# Patient Record
Sex: Female | Born: 1970
Health system: Southern US, Community
[De-identification: ages and names within clinical notes are randomized; demographics above are authoritative.]

## PROBLEM LIST (undated history)

## (undated) DIAGNOSIS — K644 Residual hemorrhoidal skin tags: Secondary | ICD-10-CM

## (undated) DIAGNOSIS — Z973 Presence of spectacles and contact lenses: Secondary | ICD-10-CM

## (undated) DIAGNOSIS — Z9289 Personal history of other medical treatment: Secondary | ICD-10-CM

## (undated) DIAGNOSIS — L83 Acanthosis nigricans: Secondary | ICD-10-CM

## (undated) DIAGNOSIS — D649 Anemia, unspecified: Secondary | ICD-10-CM

## (undated) DIAGNOSIS — I10 Essential (primary) hypertension: Secondary | ICD-10-CM

## (undated) DIAGNOSIS — E669 Obesity, unspecified: Secondary | ICD-10-CM

## (undated) DIAGNOSIS — M722 Plantar fascial fibromatosis: Secondary | ICD-10-CM

## (undated) DIAGNOSIS — G4730 Sleep apnea, unspecified: Secondary | ICD-10-CM

## (undated) DIAGNOSIS — M1710 Unilateral primary osteoarthritis, unspecified knee: Secondary | ICD-10-CM

## (undated) HISTORY — DX: Obesity, unspecified: E66.9

## (undated) HISTORY — DX: Plantar fascial fibromatosis: M72.2

## (undated) HISTORY — DX: Unilateral primary osteoarthritis, unspecified knee: M17.10

## (undated) HISTORY — DX: Anemia, unspecified: D64.9

## (undated) HISTORY — DX: Presence of spectacles and contact lenses: Z97.3

## (undated) HISTORY — DX: Acanthosis nigricans: L83

## (undated) HISTORY — DX: Residual hemorrhoidal skin tags: K64.4

---

## 1997-09-23 ENCOUNTER — Other Ambulatory Visit: Admission: RE | Admit: 1997-09-23 | Discharge: 1997-09-23 | Payer: Self-pay | Admitting: Obstetrics

## 1999-02-23 ENCOUNTER — Other Ambulatory Visit: Admission: RE | Admit: 1999-02-23 | Discharge: 1999-02-23 | Payer: Self-pay | Admitting: *Deleted

## 2001-11-12 ENCOUNTER — Ambulatory Visit (HOSPITAL_COMMUNITY): Admission: RE | Admit: 2001-11-12 | Discharge: 2001-11-12 | Payer: Self-pay | Admitting: Obstetrics

## 2001-11-12 ENCOUNTER — Encounter (INDEPENDENT_AMBULATORY_CARE_PROVIDER_SITE_OTHER): Payer: Self-pay | Admitting: Specialist

## 2002-12-05 ENCOUNTER — Encounter: Admission: RE | Admit: 2002-12-05 | Discharge: 2002-12-05 | Payer: Self-pay | Admitting: Obstetrics

## 2003-12-31 ENCOUNTER — Encounter (INDEPENDENT_AMBULATORY_CARE_PROVIDER_SITE_OTHER): Payer: Self-pay | Admitting: *Deleted

## 2003-12-31 ENCOUNTER — Ambulatory Visit (HOSPITAL_COMMUNITY): Admission: RE | Admit: 2003-12-31 | Discharge: 2003-12-31 | Payer: Self-pay | Admitting: Obstetrics

## 2006-12-06 ENCOUNTER — Inpatient Hospital Stay (HOSPITAL_COMMUNITY): Admission: AD | Admit: 2006-12-06 | Discharge: 2006-12-06 | Payer: Self-pay | Admitting: Obstetrics

## 2007-01-10 DIAGNOSIS — Z9289 Personal history of other medical treatment: Secondary | ICD-10-CM

## 2007-01-10 HISTORY — PX: ABDOMINAL HYSTERECTOMY: SHX81

## 2007-01-10 HISTORY — PX: COLONOSCOPY: SHX5424

## 2007-01-10 HISTORY — DX: Personal history of other medical treatment: Z92.89

## 2007-04-09 ENCOUNTER — Ambulatory Visit (HOSPITAL_COMMUNITY): Admission: RE | Admit: 2007-04-09 | Discharge: 2007-04-09 | Payer: Self-pay | Admitting: Obstetrics

## 2007-04-15 ENCOUNTER — Ambulatory Visit (HOSPITAL_COMMUNITY): Admission: RE | Admit: 2007-04-15 | Discharge: 2007-04-15 | Payer: Self-pay | Admitting: Cardiology

## 2007-04-24 ENCOUNTER — Inpatient Hospital Stay (HOSPITAL_COMMUNITY): Admission: RE | Admit: 2007-04-24 | Discharge: 2007-04-27 | Payer: Self-pay | Admitting: Obstetrics

## 2007-04-24 ENCOUNTER — Encounter (INDEPENDENT_AMBULATORY_CARE_PROVIDER_SITE_OTHER): Payer: Self-pay | Admitting: Obstetrics

## 2007-05-02 ENCOUNTER — Inpatient Hospital Stay (HOSPITAL_COMMUNITY): Admission: EM | Admit: 2007-05-02 | Discharge: 2007-05-07 | Payer: Self-pay | Admitting: Emergency Medicine

## 2007-06-11 LAB — HM COLONOSCOPY

## 2007-07-07 ENCOUNTER — Ambulatory Visit (HOSPITAL_BASED_OUTPATIENT_CLINIC_OR_DEPARTMENT_OTHER): Admission: RE | Admit: 2007-07-07 | Discharge: 2007-07-07 | Payer: Self-pay | Admitting: Cardiology

## 2007-07-13 ENCOUNTER — Ambulatory Visit: Payer: Self-pay | Admitting: Internal Medicine

## 2008-01-10 DIAGNOSIS — G4730 Sleep apnea, unspecified: Secondary | ICD-10-CM

## 2008-01-10 HISTORY — DX: Sleep apnea, unspecified: G47.30

## 2008-11-18 ENCOUNTER — Encounter: Admission: RE | Admit: 2008-11-18 | Discharge: 2008-11-18 | Payer: Self-pay | Admitting: Obstetrics

## 2009-04-06 ENCOUNTER — Ambulatory Visit (HOSPITAL_COMMUNITY): Admission: RE | Admit: 2009-04-06 | Discharge: 2009-04-06 | Payer: Self-pay | Admitting: Obstetrics

## 2010-04-03 LAB — COMPREHENSIVE METABOLIC PANEL
ALT: 45 U/L — ABNORMAL HIGH (ref 0–35)
AST: 27 U/L (ref 0–37)
Albumin: 4 g/dL (ref 3.5–5.2)
Alkaline Phosphatase: 44 U/L (ref 39–117)
BUN: 8 mg/dL (ref 6–23)
CO2: 25 mEq/L (ref 19–32)
Calcium: 9.2 mg/dL (ref 8.4–10.5)
Chloride: 103 mEq/L (ref 96–112)
Creatinine, Ser: 0.72 mg/dL (ref 0.4–1.2)
GFR calc Af Amer: >60 mL/min (ref >60)
GFR calc non Af Amer: >60 mL/min (ref >60)
Glucose, Bld: 120 mg/dL — ABNORMAL HIGH (ref 70–99)
Potassium: 3.8 mEq/L (ref 3.5–5.1)
Sodium: 137 mEq/L (ref 135–145)
Total Bilirubin: 0.4 mg/dL (ref 0.3–1.2)
Total Protein: 7.5 g/dL (ref 6.0–8.3)

## 2010-04-03 LAB — CBC
HCT: 34.5 % — ABNORMAL LOW (ref 36.0–46.0)
Hemoglobin: 11.5 g/dL — ABNORMAL LOW (ref 12.0–15.0)
MCHC: 33.2 g/dL (ref 30.0–36.0)
MCV: 73.7 fL — ABNORMAL LOW (ref 78.0–100.0)
Platelets: 282 10*3/uL (ref 150–400)
RBC: 4.69 MIL/uL (ref 3.87–5.11)
RDW: 14.3 % (ref 11.5–15.5)
WBC: 5.2 10*3/uL (ref 4.0–10.5)

## 2010-04-03 LAB — LIPID PANEL
Cholesterol: 162 mg/dL (ref 0–200)
HDL: 48 mg/dL (ref >39)
LDL Cholesterol: UNDETERMINED mg/dL (ref 0–99)
Total CHOL/HDL Ratio: 3.4 RATIO
Triglycerides: 430 mg/dL — ABNORMAL HIGH (ref <150)
VLDL: UNDETERMINED mg/dL (ref 0–40)

## 2010-04-03 LAB — T4: T4, Total: 8.6 ug/dL (ref 5.0–12.5)

## 2010-04-03 LAB — TSH: TSH: 2.081 u[IU]/mL (ref 0.350–4.500)

## 2010-05-24 NOTE — Procedures (Signed)
NAME:  Kayla Larsen, RADLOFF NO.:  [id number]   MEDICAL RECORD NO.:  [id number]          PATIENT TYPE:  OUT   LOCATION:  SLEEP CENTER                 FACILITY:  [facility]   PHYSICIAN:  Clinton D. Maple Hudson, MD, FCCP, FACPDATE OF BIRTH:  12-18-1968   DATE OF STUDY:  07/07/2007                            NOCTURNAL POLYSOMNOGRAM   REFERRING PHYSICIAN:  Osvaldo Shipper. Spruill, M.D.   INDICATION FOR STUDY:  Hypersomnia with sleep apnea.   EPWORTH SLEEPINESS SCORE:  9/24.   BMI:  37.8.   WEIGHT:  220 pounds.   HEIGHT:  64 inches.   NECK:  16 inches.   MEDICATIONS:  Home medications charted and reviewed.   SLEEP ARCHITECTURE:  Total sleep time 359 minutes with sleep efficiency  82.1%.  Stage 1 was 8.6%, stage 2 was 78.8%, stage 3 was 6.4%, REM 6.1%  of total sleep time.  Sleep latency 69 minutes, REM latency 343 minutes.  Awake after sleep onset 9.5 minutes.  Arousal index 26.7.  Unisom was  taken at bedtime.   RESPIRATORY DATA:  Apnea/hypopnea index (AHI) 2.7 per hour.  A total of  16 events were scored, including 14 obstructive apneas and 2 hypopneas.  Events were more common while supine.  REM AHI 27.3 per hour.  Respiratory disturbance index (RDI) was higher at 17.9 with RERA count  of 91, index 15.2 (respiratory events related to arousal).   OXYGEN DATA:  Moderately loud snoring with oxygen desaturation to a  nadir of 75%.  Mean oxygen saturation through the study was 95.4% on room air.  A total  of 8.1 minutes was spent with oxygen saturation less than 88%.   CARDIAC DATA:  Normal sinus rhythm.   MOVEMENT/PARASOMNIA:  Periodic limb movement with 5 events affecting  sleep, index 0.8 per hour, which is insignificant.   IMPRESSION/RECOMMENDATION:  1. Occasional respiratory events based on AHI of 2.7 per hour.      Respiratory disturbance index (RDI) revealed a higher number of      arousals associated with respiratory events not quite meeting      definition for apnea  or hypopnea, 17.9 per hour.  Physiologically,      this would be treated as sleep apnea.  Moderately loud snoring with      oxygen desaturation to a nadir of 75%.  2. Consider return for CPAP titration based on RDI of 17.9 per hour.      Otherwise, evaluate for alternative management as appropriate.      Clinton D. Maple Hudson, MD, [facility], FACP  Diplomate, Biomedical engineer of Sleep Medicine  Electronically Signed     CDY/MEDQ  D:  07/13/2007 11:33:25  T:  07/13/2007 11:58:57  Job:  [id number]

## 2010-05-24 NOTE — Discharge Summary (Signed)
NAMEAUSTYNN, Kayla Larsen NO.:  [id number]   MEDICAL RECORD NO.:  [id number]          PATIENT TYPE:  OUT   LOCATION:  SLEE                         FACILITY:  [facility]   PHYSICIAN:  Osvaldo Shipper. Spruill, M.D.DATE OF BIRTH:  1970-08-27   DATE OF ADMISSION:  05/02/2007  DATE OF DISCHARGE:  05/07/2007                               DISCHARGE SUMMARY   DISCHARGE DIAGNOSES:  1. Pneumonia.  2. Pulmonary collapse.  3. Anemia.  4. Excessive menstruation.  5. Obesity.  6. Hypertension.   Kayla Larsen is a 40 year old patient who was admitted to the [facility] on May 02, 2007, at that time with a chief complaint of  abdominal pain.   The initial history and physical was done by Dr. Webb Laws as well  as the documentation of the daily visits.  It is of note, this patient  had a hysterectomy approximately 1 week ago due to recurrent anemia and  severe bleeding.  She was seen in the office on the day prior to the  hospitalization with peripheral edema and weakness.  On the night prior  to admission, the patient had severe right-sided pleuritic chest pain  and came to the emergency department.  She was found to have pneumonia  with severe anemia and subsequently was admitted for the same.   The O2 saturation on admission was 97%.  The temperature was 99.2, the  pulse rate was 97.  The patient was noted to be in moderate distress.  On the chest exam, the breath sounds were decreased on the right and  there was noted 1+ pitting edema of the lower extremities   The patient was started on IV Rocephin and IV Zithromax in the emergency  department.  The patient was tried and crossmatched for packed cells and  transfused with 1 unit of packed cells.  It is of note that the initial  RBC was low at 2.94 and the initial hemoglobin was 6.6 with an MCV of  72.1   After transfusion the hemoglobin came up to 9.  With good pulmonary  toilet and IV antibiotics the patient's  temperature gradually improved,  the breathing gradually improved and the pain in the chest also  gradually improved.  The patient did require additional units of packed  cells with improvement of the hemoglobin and hematocrit.  The patient  underwent a CT scan of the abdomen and pelvis as there was a concern for  retroperitoneal bleeding following the hysterectomy.  The CT of the  abdomen showed right basilar atelectasis and minimal left basilar  atelectasis and no acute abdominal abnormality.  The CT of the pelvis  revealed postoperative hematoma formation in the inferior rectus muscles  and there was a small-to-moderate amount of uncomplicated fluid in the  pelvis cul-de-sac. There was also noted some sigmoid diverticulosis as  well as the status post hysterectomy   The patient continued to show improvement and on April 28, it was the  opinion the patient to be discharged home having received maximum  benefit from this hospitalization.   DISCHARGE MEDICATIONS:  1. Exforge  5/320 one daily.  2. Ferrous sulfate 325 mg three times a day.  3. Zithromax 250 mg daily for 3 days.  4. Oxycodone with acetaminophen one every 6 hours as needed for pain.  5. The patient is to stop Ibuprofen.   The patient is to notify the physician immediately if positive stools  for blood or other complications.  The patient is to see the OB/GYN  physician for re-evaluation of the surgical wound and follow up  according to GYN plan.      Ivery Quale, P.A.      Osvaldo Shipper. Spruill, M.D.  Electronically Signed    HB/MEDQ  D:  06/25/2007  T:  06/26/2007  Job:  [id number]

## 2010-05-24 NOTE — Op Note (Signed)
NAMEPHILOMENE, [name] NO.:  [id number]   MEDICAL RECORD NO.:  [id number]          PATIENT TYPE:  AMB   LOCATION:  SDC                           FACILITY:  [facility]   PHYSICIAN:  Kathreen Cosier, M.D.DATE OF BIRTH:  04-Jan-1971   DATE OF PROCEDURE:  DATE OF DISCHARGE:                               OPERATIVE REPORT   PREOP DIAGNOSIS:  Myoma uteri, multiple.   POSTOPERATIVE DIAGNOSIS:  Myoma uteri, multiple.   SURGEON:  Dr. Gaynell Face.   FIRST ASSISTANT:  Dr. Clearance Coots.   PROCEDURE:  The patient placed on the operating room table in the supine  position.  General anesthesia administered and abdomen prepped and  draped.  Bladder emptied with a Foley catheter.  Transverse suprapubic  incision made through the old scar, carried down to the fascia.  Fascia  cleaned and incised the length of the incision.  Recti muscles were  retracted laterally.  Peritoneum incised longitudinally.  The uterus was  20 weeks size multiple myomas.  The uterus was adherent to the bowel and  this was dissected free.  The right round ligament was grasped with a  Kelly clamp, cut, suture ligated with #1 chromic.  Procedure done in a  similar fashion on the other side.   Using Metzenbaum scissors the bladder was dissected off of the cervix.  The right utero-ovarian ligament was cut, suture ligated with #1  chromic.  Procedure done in a similar fashion on the other side.  Uterine vessels were skeletonized bilaterally and double clamped with  Heaney clamps on the right, cut and suture ligated x2 with #1 chromic.  Procedure done in a similar fashion on the other side.  The right  infundibulopelvic ligaments grasped with a straight Kocher, cut and  suture-ligated with a #1 chromic. Done in a similar fashion on the other  side.  The uterus was separated from the cervix with a scalpel and then  the cervix was dissected off.  Modified Richardson sutures were placed  with #1 chromic in the vault of the  vagina.  The endovaginal vault was  run with interlocking suture of #1 chromic.   Hemostasis was satisfactory.  Lap and sponge counts correct.  The  portion of the left tube was grasped with a Kelly clamp, cut and suture  ligated with #1 chromic.  Procedure done in a similar fashion on the  other side.  The portion of tubes were sent to pathology along with the  uterus and cervix.  Lap and sponge counts correct.  Abdomen closed in  layers; the peritoneum with continuous suture of O chromic, fascia with  continuous suture of 0 Dexon, the skin closed with a subcuticular stitch  of 4-0 Monocryl.  Blood loss 1000 mL.  The patient tolerated the  procedure well, taken to the recovery room in good condition.           ______________________________  Kathreen Cosier, M.D.     BAM/MEDQ  D:  04/24/2007  T:  04/24/2007  Job:  [id number]

## 2010-05-27 NOTE — Op Note (Signed)
   NAME:  Kayla Larsen, Kayla Larsen                         ACCOUNT NO.:  [id number]   MEDICAL RECORD NO.:  [id number]                   PATIENT TYPE:  AMB   LOCATION:  SDC                                  FACILITY:  [facility]   PHYSICIAN:  Kathreen Cosier, M.D.           DATE OF BIRTH:  1970-04-25   DATE OF PROCEDURE:  11/12/2001  DATE OF DISCHARGE:                                 OPERATIVE REPORT   PREOPERATIVE DIAGNOSIS:  Dysfunctional uterine bleeding.   PROCEDURE:  Using MAC, the patient in lithotomy position.  The perineum and  vagina prepped and draped.  Bladder emptied with a straight catheter.  Bilateral examination revealed the uterus was enlarged with multiple myomas.  A weighted speculum was placed in the vagina.  The cervix was injected with  1% Xylocaine, a total of 8 cc of _________.  The anterior lip of the cervix  grasped with a tenaculum and the cervix curetted of a small amount of  tissue.  Endometrial cavity sounded 10 cm.  Cervix dilated to a 27 Pratt.  The endometrial cavity was posterior.  The endometrial cavity was curetted  with sharp curette until clean.  A large amount of tissue obtained.  Polyp  forceps inserted, no polyps obtained.  The patient tolerated the procedure  well, taken to the recovery room in good condition.                                               Kathreen Cosier, M.D.    BAM/MEDQ  D:  11/12/2001  T:  11/12/2001  Job:  [id number]

## 2010-05-27 NOTE — Discharge Summary (Signed)
Kayla Larsen, Kayla Larsen NO.:  [id number]   MEDICAL RECORD NO.:  [id number]           PATIENT TYPE:   LOCATION:                                 FACILITY:   PHYSICIAN:  Kathreen Cosier, M.D.DATE OF BIRTH:  01-Jun-1970   DATE OF ADMISSION:  04/24/2007  DATE OF DISCHARGE:  04/27/2007                               DISCHARGE SUMMARY   The patient is a 40 year old gravida 2, para 2-0-0-2 who has heavy  periods with clots.  She had NovaSure ablation in the past, C-section  x2, D&C, and 18 to 20-week size myomas.  She underwent a total abdominal  hysterectomy and bilateral salpingectomy on April 24, 2007.  Postoperatively, she did well.  On admission, her hemoglobin was 9.7 and  postop 8.3, white count 5 and 15.1, and platelets 366 and 319.  Sodium  138, potassium 3.8, and chloride 106.  Urine negative and creatinine  0.62.  Postoperatively, the patient did well and was discharged on the  third postoperative day, ambulatory on a regular diet.  She is to see me  in 4 weeks.   DISCHARGE DIAGNOSIS:  Status post total abdominal hysterectomy and  bilateral salpingectomy for myoma uteri.           ______________________________  Kathreen Cosier, M.D.     BAM/MEDQ  D:  05/15/2007  T:  05/15/2007  Job:  [id number]

## 2010-05-27 NOTE — Op Note (Signed)
NAMEVINETTE, CRITES NO.:  [id number]   MEDICAL RECORD NO.:  [id number]          PATIENT TYPE:  AMB   LOCATION:  SDC                           FACILITY:  [facility]   PHYSICIAN:  Kathreen Cosier, M.D.DATE OF BIRTH:  06-20-1970   DATE OF PROCEDURE:  12/31/2003  DATE OF DISCHARGE:                                 OPERATIVE REPORT   PREOPERATIVE DIAGNOSIS:  Dysfunctional uterine bleeding.   Under general anesthesia, patient in the __________ position, laminaria was  removed from the cervix and the perineum and vagina were prepped and draped,  bladder emptied with a straight catheter.  Bimanual exam revealed the uterus  to be enlarged with myomas.  Speculum placed in the vagina, anterior lip of  the cervix grasped with a tenaculum.  The endocervix curetted, a small  amount of tissue obtained.  The endometrial cavity sounded to 11 cm.  Using  the Hegar dilator, the cervical length was 5.5 cm, cavity length 6 cm.  The  hysteroscope was inserted and the cavity was noted to be normal.  Sharp  curettage was then performed and a small amount of tissue was obtained.  The  NovaSure device was inserted.  The cavity width was 4.7 cm, and NovaSure  ablation was done at 155 watts at 58 seconds.  After the procedure, the  hysteroscopy was repeated and there was total ablation in the cavity.  The  fluid deficit was 50 mL.  The patient tolerated the procedure well, taken to  the recovery room in good condition.      BAM/MEDQ  D:  12/31/2003  T:  12/31/2003  Job:  [id number]

## 2010-10-04 LAB — CROSSMATCH
ABO/RH(D): B POS
Antibody Screen: NEGATIVE

## 2010-10-04 LAB — BASIC METABOLIC PANEL
BUN: 6
BUN: 8
CO2: 26
CO2: 24
CO2: 26
Calcium: 9.1
Calcium: 8.5
Chloride: 106
Chloride: 103
Chloride: 105
Creatinine, Ser: 0.62
Creatinine, Ser: 0.77
GFR calc Af Amer: >60
GFR calc Af Amer: >60
GFR calc non Af Amer: >60
GFR calc non Af Amer: >60
GFR calc non Af Amer: >60
Glucose, Bld: 97
Glucose, Bld: 109 — ABNORMAL HIGH
Glucose, Bld: 111 — ABNORMAL HIGH
Potassium: 3.8
Potassium: 3.2 — ABNORMAL LOW
Potassium: 4.3
Sodium: 138
Sodium: 136
Sodium: 140

## 2010-10-04 LAB — CBC
HCT: 29.7 — ABNORMAL LOW
HCT: 25.1 — ABNORMAL LOW
HCT: 21.2 — ABNORMAL LOW
HCT: 22.7 — ABNORMAL LOW
HCT: 26.9 — ABNORMAL LOW
HCT: 30.4 — ABNORMAL LOW
Hemoglobin: 9.7 — ABNORMAL LOW
Hemoglobin: 8.3 — ABNORMAL LOW
Hemoglobin: 6.6 — CL
Hemoglobin: 8.6 — ABNORMAL LOW
Hemoglobin: 9.1 — ABNORMAL LOW
Hemoglobin: 9.9 — ABNORMAL LOW
MCHC: 32.7
MCHC: 33.1
MCHC: 31.3
MCHC: 32.1
MCHC: 32.2
MCV: 72.7 — ABNORMAL LOW
MCV: 72.5 — ABNORMAL LOW
MCV: 72.1 — ABNORMAL LOW
MCV: 73.8 — ABNORMAL LOW
MCV: 75.5 — ABNORMAL LOW
Platelets: 366
Platelets: 319
Platelets: 490 — ABNORMAL HIGH
Platelets: 543 — ABNORMAL HIGH
RBC: 4.09
RBC: 3.46 — ABNORMAL LOW
RBC: 2.94 — ABNORMAL LOW
RBC: 3.07 — ABNORMAL LOW
RBC: 3.65 — ABNORMAL LOW
RDW: 15.4
RDW: 15
RDW: 14.5
RDW: 19 — ABNORMAL HIGH
RDW: 19.5 — ABNORMAL HIGH
WBC: 5
WBC: 15.1 — ABNORMAL HIGH
WBC: 12.3 — ABNORMAL HIGH
WBC: 7.4
WBC: 9.7

## 2010-10-04 LAB — URINALYSIS, ROUTINE W REFLEX MICROSCOPIC
Bilirubin Urine: NEGATIVE
Glucose, UA: NEGATIVE
Hgb urine dipstick: NEGATIVE
Protein, ur: NEGATIVE

## 2010-10-04 LAB — PROTEIN, URINE, 24 HOUR
Collection Interval-UPROT: 24
Urine Total Volume-UPROT: 2550

## 2010-10-04 LAB — DIFFERENTIAL
Basophils Absolute: 0
Basophils Relative: 0
Eosinophils Absolute: 0.1
Eosinophils Relative: 2
Lymphocytes Relative: 20
Lymphs Abs: 1.5
Monocytes Absolute: 0.7
Monocytes Relative: 9
Neutro Abs: 5.1
Neutrophils Relative %: 69

## 2010-10-04 LAB — HEPATIC FUNCTION PANEL
ALT: 28
AST: 21
Albumin: 3.2 — ABNORMAL LOW
Alkaline Phosphatase: 50
Bilirubin, Direct: 0.1
Indirect Bilirubin: 0.3
Total Bilirubin: 0.4
Total Protein: 6.6

## 2010-10-04 LAB — COMPREHENSIVE METABOLIC PANEL
BUN: 5 — ABNORMAL LOW
CO2: 26
Calcium: 8.5
Chloride: 107
Creatinine, Ser: 0.68
GFR calc non Af Amer: >60
Total Bilirubin: 0.2 — ABNORMAL LOW

## 2010-10-04 LAB — OCCULT BLOOD X 1 CARD TO LAB, STOOL: Fecal Occult Bld: POSITIVE

## 2010-10-18 LAB — CBC
HCT: 25.3 — ABNORMAL LOW
Hemoglobin: 7.9 — CL
MCHC: 31.3
RDW: 20.6 — ABNORMAL HIGH

## 2012-01-10 HISTORY — PX: OOPHORECTOMY: SHX86

## 2012-01-10 HISTORY — PX: OTHER SURGICAL HISTORY: SHX169

## 2012-01-26 ENCOUNTER — Ambulatory Visit: Payer: Self-pay | Admitting: Dietician

## 2012-04-18 ENCOUNTER — Other Ambulatory Visit (HOSPITAL_COMMUNITY): Payer: Self-pay | Admitting: Obstetrics

## 2012-04-29 ENCOUNTER — Other Ambulatory Visit (HOSPITAL_COMMUNITY): Payer: Self-pay | Admitting: Obstetrics

## 2012-04-29 DIAGNOSIS — Z1231 Encounter for screening mammogram for malignant neoplasm of breast: Secondary | ICD-10-CM

## 2012-05-08 ENCOUNTER — Other Ambulatory Visit (HOSPITAL_COMMUNITY): Payer: Self-pay | Admitting: Obstetrics

## 2012-05-08 ENCOUNTER — Ambulatory Visit (HOSPITAL_COMMUNITY)
Admission: RE | Admit: 2012-05-08 | Discharge: 2012-05-08 | Disposition: A | Discharge status: Home / Self Care | Payer: No Typology Code available for payment source | Source: Ambulatory Visit | Attending: Obstetrics | Admitting: Obstetrics

## 2012-05-08 DIAGNOSIS — Z1231 Encounter for screening mammogram for malignant neoplasm of breast: Secondary | ICD-10-CM

## 2012-05-08 DIAGNOSIS — N83202 Unspecified ovarian cyst, left side: Secondary | ICD-10-CM

## 2012-05-10 ENCOUNTER — Ambulatory Visit (HOSPITAL_COMMUNITY): Payer: No Typology Code available for payment source

## 2012-05-17 ENCOUNTER — Ambulatory Visit (HOSPITAL_COMMUNITY): Payer: No Typology Code available for payment source

## 2012-05-22 ENCOUNTER — Ambulatory Visit (HOSPITAL_COMMUNITY): Payer: No Typology Code available for payment source

## 2012-05-27 ENCOUNTER — Ambulatory Visit: Payer: Self-pay | Admitting: Physician Assistant

## 2012-05-28 ENCOUNTER — Ambulatory Visit (HOSPITAL_COMMUNITY)
Admission: RE | Admit: 2012-05-28 | Discharge: 2012-05-28 | Disposition: A | Discharge status: Home / Self Care | Payer: No Typology Code available for payment source | Source: Ambulatory Visit | Attending: Obstetrics | Admitting: Obstetrics

## 2012-05-28 DIAGNOSIS — N83202 Unspecified ovarian cyst, left side: Secondary | ICD-10-CM

## 2012-05-28 DIAGNOSIS — N83209 Unspecified ovarian cyst, unspecified side: Secondary | ICD-10-CM | POA: Insufficient documentation

## 2012-05-28 DIAGNOSIS — Z90710 Acquired absence of both cervix and uterus: Secondary | ICD-10-CM | POA: Insufficient documentation

## 2012-05-28 DIAGNOSIS — E669 Obesity, unspecified: Secondary | ICD-10-CM | POA: Insufficient documentation

## 2012-06-07 ENCOUNTER — Other Ambulatory Visit (HOSPITAL_COMMUNITY): Payer: Self-pay | Admitting: Obstetrics

## 2012-06-07 DIAGNOSIS — IMO0002 Reserved for concepts with insufficient information to code with codable children: Secondary | ICD-10-CM

## 2012-06-10 ENCOUNTER — Ambulatory Visit (HOSPITAL_COMMUNITY)
Admission: RE | Admit: 2012-06-10 | Discharge: 2012-06-10 | Disposition: A | Discharge status: Home / Self Care | Payer: No Typology Code available for payment source | Source: Ambulatory Visit | Attending: Obstetrics | Admitting: Obstetrics

## 2012-06-10 DIAGNOSIS — IMO0002 Reserved for concepts with insufficient information to code with codable children: Secondary | ICD-10-CM

## 2012-06-10 DIAGNOSIS — R1900 Intra-abdominal and pelvic swelling, mass and lump, unspecified site: Secondary | ICD-10-CM | POA: Insufficient documentation

## 2012-06-10 DIAGNOSIS — Z90710 Acquired absence of both cervix and uterus: Secondary | ICD-10-CM | POA: Insufficient documentation

## 2012-06-10 LAB — CREATININE, SERUM: GFR calc non Af Amer: >90 mL/min (ref >90)

## 2012-06-10 MED ORDER — GADOBENATE DIMEGLUMINE 529 MG/ML IV SOLN
20.0000 mL | Freq: Once | INTRAVENOUS | Status: AC | PRN
  Administered 2012-06-10: 20 mL via INTRAVENOUS

## 2012-07-01 ENCOUNTER — Telehealth: Payer: Self-pay | Admitting: Gynecologic Oncology

## 2012-07-01 NOTE — Telephone Encounter (Signed)
Consult Note: Gyn-Onc  Consult was requested by Dr. Chevis Pretty for the evaluation of Kayla Larsen 42 y.o. female  CC:  Pelvic mass  Assessment/Plan:  [name]  is a 42 y.o.  year old **   HPI: Kayla Larsen is a 42 y.o.   Pelvic UTZ 05/28/2012 notable for  Endometrium: The uterus is surgically absent. Right ovary: The ovary is not visualized. Left ovary: The ovary is not visualized. Other findings: Slightly to the left of midline, there is a multicystic mass. The largest component contains internal echoes and measures 10.0 x 5.5 x 6.8 cm. Other components appear more  simple but have thickened walls and measure up to 5.5 cm. No vascular components are identified with color Doppler evaluation .   Current Meds:  No outpatient encounter prescriptions on file as of 07/01/2012.   No facility-administered encounter medications on file as of 07/01/2012.    Allergy: Allergies not on file  Social Hx:   History   Social History  . Marital Status: Single    Spouse Name: N/A    Number of Children: N/A  . Years of Education: N/A   Occupational History  . Not on file.   Social History Main Topics  . Smoking status: Not on file  . Smokeless tobacco: Not on file  . Alcohol Use: Not on file  . Drug Use: Not on file  . Sexually Active: Not on file   Other Topics Concern  . Not on file   Social History Narrative  . No narrative on file    Past Surgical Hx: No past surgical history on file.  Past Medical Hx: No past medical history on file.  Past Gynecological History:  ** No LMP recorded. Patient has had a hysterectomy.  Family Hx: No family history on file.  Review of Systems:  Constitutional  Feels well,  ** Skin/Breast  No rash, sores, jaundice, itching, dryness Cardiovascular  No chest pain, shortness of breath, or edema  Pulmonary  No cough or wheeze.  Gastro Intestinal  No nausea, vomitting, or diarrhoea. No bright red blood per rectum, no abdominal  pain, change in bowel movement, or constipation.  Genito Urinary  No frequency, urgency, dysuria, ** Musculo Skeletal  No myalgia, arthralgia, joint swelling or pain  Neurologic  No weakness, numbness, change in gait,  Psychology  No depression, anxiety, insomnia.   Vitals:  @V @  Physical Exam: WD in NAD Neck  Supple NROM, without any enlargements.  Lymph Node Survey No cervical supraclavicular or inguinal adenopathy Cardiovascular  Pulse normal rate, regularity and rhythm. S1 and S2 normal.  Lungs  Clear to auscultation bilateraly, without wheezes/crackles/rhonchi. Good air movement.  Skin  No rash/lesions/breakdown  Psychiatry  Alert and oriented to person, place, and time  Abdomen  Normoactive bowel sounds, abdomen soft, non-tender and obese. Surgical  sites intact without evidence of hernia.  Back No CVA tenderness Genito Urinary  Vulva/vagina: Normal external female genitalia.  No lesions. No discharge or bleeding.  Bladder/urethra:  No lesions or masses  Vagina: **  Cervix: Normal appearing, no lesions.  Uterus: Small, mobile, no parametrial involvement or nodularity.  Adnexa: No palpable masses. Rectal  Good tone, no masses no cul de sac nodularity.  Extremities  No bilateral cyanosis, clubbing or edema.   Laurette Schimke, MD, PhD 07/01/2012, 10:16 PM

## 2012-07-02 ENCOUNTER — Encounter: Payer: Self-pay | Admitting: Gynecologic Oncology

## 2012-07-02 ENCOUNTER — Ambulatory Visit: Payer: No Typology Code available for payment source | Attending: Gynecologic Oncology | Admitting: Gynecologic Oncology

## 2012-07-02 VITALS — BP 152/94 | HR 64 | Temp 99.2°F | Resp 18 | Ht 64.0 in | Wt 241.9 lb

## 2012-07-02 DIAGNOSIS — N83299 Other ovarian cyst, unspecified side: Secondary | ICD-10-CM

## 2012-07-02 DIAGNOSIS — R1909 Other intra-abdominal and pelvic swelling, mass and lump: Secondary | ICD-10-CM | POA: Insufficient documentation

## 2012-07-02 DIAGNOSIS — Z90710 Acquired absence of both cervix and uterus: Secondary | ICD-10-CM | POA: Insufficient documentation

## 2012-07-02 NOTE — Patient Instructions (Addendum)
Left salpingo-oophorectomy and right salpingectomy was performed by Dr. De Blanch on Tuesday, 07/30/2012 His present for preoperative assessment is scheduled   Thank you very much [name] for allowing me to provide care for you today.  I appreciate your confidence in choosing our Gynecologic Oncology team.  If you have any questions about your visit today please call our office and we will get back to you as soon as possible.  [name]. Annaclaire Walsworth MD., PhD Gynecologic Oncology

## 2012-07-02 NOTE — Progress Notes (Signed)
Consult Note: Gyn-Onc  Consult was requested by Dr. Chevis Pretty for the evaluation of JAID QUIRION 42 y.o. female  CC:  Pelvic mass  Assessment/Plan:  Ms. AIRYANNA DIPALMA  is a 42 y.o.  year old with a 12 cm pelvic mass that appears to arise from the left adnexa. A CA 125 was ordered. The recommendation was for left salpingo-oophorectomy with possible right salpingectomy. Based on frozen section findings that of surgical procedure could be inclusive of omentectomy contralateral oophorectomy  staging and debulking.  The risks of procedure discussed with patient were that of infection bleeding damage to surrounding structures prolonged hospitalization and reoperation. All of her questions were answered to her satisfaction. She is aware of this procedure will be performed by Dr. De Blanch on 07/30/2012. All of her questions were answered to her satisfaction.   HPI: RABECKA BRENDEL is a 42 y.o.  Who on routine physical examination in may of 2040 was noted have a pelvic mass. A transvaginal ultrasound of the pelvis obtained on 05/28/2012 and was notable for a mass slightly to the left of the midline is multicystic the largest component contains internal echoes and measures 10.0 x 5.5 x 6.8 cm. MRI of the pelvis on 06/10/2012 demonstrates a presence of 12.0 x 10.4 x 7.3 cm complex multicystic mass is no evidence for mural nodule he rectal wall septal thickening a papillary excrescence. There does appear to be residual ovarian tissue along the anterior lateral margin of the mass. Right ovary measured 3.2 x 1.8 x 2.6 cm. No pelvic lymphadenopathy. Diverticular changes were noted in the sigmoid without features of diverticulitis a very trace amount of free fluid was noted in the cul-de-sac  Ms. Pylant denies any increased abdominal girth is no early satiety change in appetite change in bowel or rectal habits malaise or fatigue.  Current Meds:  Outpatient Encounter Prescriptions as of 07/02/2012   Medication Sig Dispense Refill  . amLODipine-valsartan (EXFORGE) 5-320 MG per tablet Take 1 tablet by mouth daily.      . meloxicam (MOBIC) 7.5 MG tablet Take 7.5 mg by mouth daily.      . Multiple Vitamin (MULTIVITAMIN WITH MINERALS) TABS Take 1 tablet by mouth daily.      Marland Kitchen PENICILLIN V POTASSIUM PO Take by mouth 4 (four) times daily.       No facility-administered encounter medications on file as of 07/02/2012.    Allergy: Not on File  Social Hx:   History   Social History  . Marital Status: Single    Spouse Name: N/A    Number of Children: N/A  . Years of Education: N/A   Occupational History  . Not on file.   Social History Main Topics  . Smoking status: Former Smoker -- 4 years    Quit date: 09/29/2011  . Smokeless tobacco: Not on file  . Alcohol Use: Yes     Comment: occassional  . Drug Use: No  . Sexually Active: No   Other Topics Concern  . Not on file   Social History Narrative  . No narrative on file    Past Surgical Hx: C-section x2 Hysterectomy 2009  Past Medical Hx: No past medical history on file.  Past Gynecological History:  Gravida 2 para 2 last menstrual period 2009 at the time of hysterectomy for uterine leiomyoma menarche occurred at the age of 83 with regular until hysterectomy. Reports oral contraceptive pill use for 5 years followed by bilateral tubal ligation  Family Hx:  No family history on file.  Review of Systems:  Constitutional  Feels well,  no weight loss  Cardiovascular  No chest pain, shortness of breath, or edema  Pulmonary  No cough or wheeze.  Gastro Intestinal  No nausea, vomitting, or diarrhoea. No bright red blood per rectum, no abdominal pain, change in bowel movement, or constipation. No change in appetite no early satiety Genito Urinary  No frequency, urgency, dysuria, Musculo Skeletal  No myalgia, arthralgia, joint swelling or pain  Neurologic  No weakness, numbness, change in gait,  Psychology  No  depression, anxiety, insomnia.   Vitals:  BP 152/94  Pulse 64  Temp(Src) 99.2 F (37.3 C) (Oral)  Resp 18  Ht 5\' 4"  (1.626 m)  Wt 241 lb 14.4 oz (109.725 kg)  BMI 41.5 kg/m2  Physical Exam: WD in NAD Neck  Supple NROM, without any enlargements.  Lymph Node Survey No cervical supraclavicular or inguinal adenopathy Cardiovascular  Pulse normal rate, regularity and rhythm. S1 and S2 normal.  Lungs  Clear to auscultation bilateraly, without wheezes/crackles/rhonchi. Good air movement.  Skin  No rash/lesions/breakdown  Psychiatry  Alert and oriented to person, place, and time  Abdomen  Normoactive bowel sounds, abdomen soft, non-tender and obese. Pfannenstiel surgical  site intact without evidence of hernia.  Back No CVA tenderness Genito Urinary  Vulva/vagina: Normal external female genitalia.  No lesions. No discharge or bleeding.  Bladder/urethra:  No lesions or masses  Vagina: Well estrogenized no lesion  Adnexa: 13 cm firm mass. Rectal  Good tone, no masses no cul de sac nodularity. Mass not palpable on rectal Extremities  No bilateral cyanosis, clubbing or edema.   Laurette Schimke, MD, PhD 07/02/2012, 5:00 PM

## 2012-07-22 ENCOUNTER — Encounter (HOSPITAL_COMMUNITY): Payer: Self-pay | Admitting: Pharmacy Technician

## 2012-07-25 ENCOUNTER — Other Ambulatory Visit (HOSPITAL_COMMUNITY): Payer: Self-pay | Admitting: Gynecology

## 2012-07-25 NOTE — Patient Instructions (Addendum)
18 Kayla Larsen  07/25/2012   Your procedure is scheduled on: 07-30-2012  Report to [facility] at 945 AM.  Call this number if you have problems the morning of surgery [phone]   Remember:   Do not eat food After Midnight Sunday night   bowel prep with clear liquids all day Monday 07-29-2012   Take these medicines the morning of surgery with A SIP OF WATER: none                                SEE [place] PREPARING FOR SURGERY SHEET   Do not wear jewelry, make-up or nail polish.  Do not wear lotions, powders, or perfumes. You may wear deodorant.   Men may shave face and neck.  Do not bring valuables to the hospital. [place] IS NOT RESPONSIBLE FOR VALUEABLES.  Contacts, dentures or bridgework may not be worn into surgery.  Leave suitcase in the car. After surgery it may be brought to your room.  For patients admitted to the hospital, checkout time is 11:00 AM the day of discharge.   Patients discharged the day of surgery will not be allowed to drive home.  Name and phone number of your driver:  Special Instructions: N/A   Please read over the following fact sheets that you were given: MRSA Informatio, clear liquid sheet, incentive sprometer fact sheet.  Call Cain Sieve RN pre op nurse if needed [phone]    FAILURE TO FOLLOW THESE INSTRUCTIONS MAY RESULT IN THE CANCELLATION OF YOUR SURGERY.  PATIENT SIGNATURE___________________________________________  NURSE SIGNATURE_____________________________________________

## 2012-07-26 ENCOUNTER — Encounter (HOSPITAL_COMMUNITY): Payer: Self-pay

## 2012-07-26 ENCOUNTER — Ambulatory Visit (HOSPITAL_COMMUNITY)
Admission: RE | Admit: 2012-07-26 | Discharge: 2012-07-26 | Disposition: A | Discharge status: Home / Self Care | Payer: No Typology Code available for payment source | Source: Ambulatory Visit | Attending: Gynecology | Admitting: Gynecology

## 2012-07-26 ENCOUNTER — Encounter (HOSPITAL_COMMUNITY)
Admission: RE | Admit: 2012-07-26 | Discharge: 2012-07-26 | Disposition: A | Discharge status: Home / Self Care | Payer: No Typology Code available for payment source | Source: Ambulatory Visit | Attending: Gynecology | Admitting: Gynecology

## 2012-07-26 DIAGNOSIS — Z01818 Encounter for other preprocedural examination: Secondary | ICD-10-CM | POA: Insufficient documentation

## 2012-07-26 DIAGNOSIS — R1900 Intra-abdominal and pelvic swelling, mass and lump, unspecified site: Secondary | ICD-10-CM | POA: Insufficient documentation

## 2012-07-26 DIAGNOSIS — Z0183 Encounter for blood typing: Secondary | ICD-10-CM | POA: Insufficient documentation

## 2012-07-26 DIAGNOSIS — Z01812 Encounter for preprocedural laboratory examination: Secondary | ICD-10-CM | POA: Insufficient documentation

## 2012-07-26 HISTORY — DX: Sleep apnea, unspecified: G47.30

## 2012-07-26 HISTORY — DX: Essential (primary) hypertension: I10

## 2012-07-26 HISTORY — DX: Personal history of other medical treatment: Z92.89

## 2012-07-26 LAB — COMPREHENSIVE METABOLIC PANEL
Alkaline Phosphatase: 49 U/L (ref 39–117)
BUN: 15 mg/dL (ref 6–23)
CO2: 28 mEq/L (ref 19–32)
GFR calc Af Amer: >90 mL/min (ref >90)
GFR calc non Af Amer: >90 mL/min (ref >90)
Glucose, Bld: 102 mg/dL — ABNORMAL HIGH (ref 70–99)
Potassium: 3.8 mEq/L (ref 3.5–5.1)
Total Protein: 7.7 g/dL (ref 6.0–8.3)

## 2012-07-26 LAB — CBC WITH DIFFERENTIAL/PLATELET
Eosinophils Absolute: 0.2 10*3/uL (ref 0.0–0.7)
Eosinophils Relative: 4 % (ref 0–5)
Hemoglobin: 11.2 g/dL — ABNORMAL LOW (ref 12.0–15.0)
Lymphs Abs: 3 10*3/uL (ref 0.7–4.0)
MCH: 22.6 pg — ABNORMAL LOW (ref 26.0–34.0)
MCV: 72.3 fL — ABNORMAL LOW (ref 78.0–100.0)
Monocytes Relative: 10 % (ref 3–12)
Neutrophils Relative %: 38 % — ABNORMAL LOW (ref 43–77)
RBC: 4.95 MIL/uL (ref 3.87–5.11)

## 2012-07-29 ENCOUNTER — Telehealth: Payer: Self-pay | Admitting: Gynecologic Oncology

## 2012-07-29 MED ORDER — AMLODIPINE BESYLATE 5 MG PO TABS
5.0000 mg | ORAL_TABLET | Freq: Once | ORAL | Status: DC
  Administered 2012-07-30: 5 mg via ORAL
  Filled 2012-07-29: qty 1

## 2012-07-29 NOTE — Telephone Encounter (Signed)
Spoke with patient about pre-operative status.  She has started taking in clear liquids only this am.  Plans to begin bowel prep in at 10 am today.  No concerns voiced.  Instructed to call for any needs.  Verbalizing understanding of instructions for surgery tomorrow morning.

## 2012-07-30 ENCOUNTER — Ambulatory Visit (HOSPITAL_COMMUNITY): Payer: No Typology Code available for payment source | Admitting: Anesthesiology

## 2012-07-30 ENCOUNTER — Inpatient Hospital Stay (HOSPITAL_COMMUNITY)
Admission: RE | Admit: 2012-07-30 | Discharge: 2012-08-02 | DRG: 743 | Disposition: A | Discharge status: Home / Self Care | Payer: No Typology Code available for payment source | Source: Ambulatory Visit | Attending: Obstetrics & Gynecology | Admitting: Obstetrics & Gynecology

## 2012-07-30 ENCOUNTER — Encounter (HOSPITAL_COMMUNITY)
Admission: RE | Disposition: A | Discharge status: Home / Self Care | Payer: Self-pay | Source: Ambulatory Visit | Attending: Obstetrics & Gynecology

## 2012-07-30 ENCOUNTER — Encounter (HOSPITAL_COMMUNITY): Payer: Self-pay | Admitting: *Deleted

## 2012-07-30 ENCOUNTER — Encounter (HOSPITAL_COMMUNITY): Payer: Self-pay | Admitting: Anesthesiology

## 2012-07-30 DIAGNOSIS — D279 Benign neoplasm of unspecified ovary: Principal | ICD-10-CM | POA: Diagnosis present

## 2012-07-30 DIAGNOSIS — N7013 Chronic salpingitis and oophoritis: Secondary | ICD-10-CM

## 2012-07-30 DIAGNOSIS — R1900 Intra-abdominal and pelvic swelling, mass and lump, unspecified site: Secondary | ICD-10-CM | POA: Diagnosis present

## 2012-07-30 DIAGNOSIS — N736 Female pelvic peritoneal adhesions (postinfective): Secondary | ICD-10-CM | POA: Diagnosis present

## 2012-07-30 DIAGNOSIS — N80109 Endometriosis of ovary, unspecified side, unspecified depth: Secondary | ICD-10-CM | POA: Diagnosis present

## 2012-07-30 DIAGNOSIS — N2889 Other specified disorders of kidney and ureter: Secondary | ICD-10-CM | POA: Diagnosis present

## 2012-07-30 DIAGNOSIS — I10 Essential (primary) hypertension: Secondary | ICD-10-CM | POA: Diagnosis present

## 2012-07-30 DIAGNOSIS — N83209 Unspecified ovarian cyst, unspecified side: Secondary | ICD-10-CM

## 2012-07-30 DIAGNOSIS — N801 Endometriosis of ovary: Secondary | ICD-10-CM | POA: Diagnosis present

## 2012-07-30 HISTORY — PX: LAPAROTOMY: SHX154

## 2012-07-30 LAB — TYPE AND SCREEN
ABO/RH(D): B POS
Weak D: POSITIVE

## 2012-07-30 SURGERY — LAPAROTOMY, EXPLORATORY
Anesthesia: General | Laterality: Left | Wound class: Clean

## 2012-07-30 MED ORDER — LACTATED RINGERS IV SOLN
INTRAVENOUS | Status: DC
  Administered 2012-07-30: 1000 mL via INTRAVENOUS

## 2012-07-30 MED ORDER — HEPARIN SODIUM (PORCINE) 1000 UNIT/ML IJ SOLN
INTRAMUSCULAR | Status: DC | PRN
  Administered 2012-07-30: 1000 [IU]

## 2012-07-30 MED ORDER — INDIGOTINDISULFONATE SODIUM 8 MG/ML IJ SOLN
INTRAMUSCULAR | Status: DC | PRN
  Administered 2012-07-30: 40 mg via INTRAVENOUS

## 2012-07-30 MED ORDER — ROCURONIUM BROMIDE 100 MG/10ML IV SOLN
INTRAVENOUS | Status: DC | PRN
  Administered 2012-07-30: 10 mg via INTRAVENOUS
  Administered 2012-07-30: 40 mg via INTRAVENOUS
  Administered 2012-07-30: 10 mg via INTRAVENOUS

## 2012-07-30 MED ORDER — NEOSTIGMINE METHYLSULFATE 1 MG/ML IJ SOLN
INTRAMUSCULAR | Status: DC | PRN
  Administered 2012-07-30: 4 mg via INTRAVENOUS

## 2012-07-30 MED ORDER — ONDANSETRON HCL 4 MG/2ML IJ SOLN
4.0000 mg | Freq: Four times a day (QID) | INTRAMUSCULAR | Status: DC | PRN
  Filled 2012-07-30: qty 2

## 2012-07-30 MED ORDER — AMLODIPINE BESYLATE 5 MG PO TABS
5.0000 mg | ORAL_TABLET | Freq: Every day | ORAL | Status: DC
  Administered 2012-07-31 – 2012-08-02 (×3): 5 mg via ORAL
  Filled 2012-07-30 (×3): qty 1

## 2012-07-30 MED ORDER — AMLODIPINE BESYLATE-VALSARTAN 5-320 MG PO TABS: 1.0000 | ORAL_TABLET | Freq: Every morning | ORAL | Status: DC

## 2012-07-30 MED ORDER — GLYCOPYRROLATE 0.2 MG/ML IJ SOLN
INTRAMUSCULAR | Status: DC | PRN
  Administered 2012-07-30: .6 mg via INTRAVENOUS

## 2012-07-30 MED ORDER — SODIUM CHLORIDE 0.9 % IJ SOLN
INTRAMUSCULAR | Status: DC | PRN
  Administered 2012-07-30: 13:00:00

## 2012-07-30 MED ORDER — OXYCODONE HCL 5 MG PO TABS
5.0000 mg | ORAL_TABLET | ORAL | Status: DC | PRN
  Administered 2012-07-30 – 2012-08-02 (×9): 5 mg via ORAL
  Filled 2012-07-30 (×9): qty 1

## 2012-07-30 MED ORDER — HYDROMORPHONE HCL PF 1 MG/ML IJ SOLN
0.2500 mg | INTRAMUSCULAR | Status: DC | PRN
  Administered 2012-07-30 (×4): 0.5 mg via INTRAVENOUS

## 2012-07-30 MED ORDER — SUCCINYLCHOLINE CHLORIDE 20 MG/ML IJ SOLN
INTRAMUSCULAR | Status: DC | PRN
  Administered 2012-07-30: 100 mg via INTRAVENOUS

## 2012-07-30 MED ORDER — ENSURE COMPLETE PO LIQD
237.0000 mL | Freq: Two times a day (BID) | ORAL | Status: DC
  Administered 2012-07-31 – 2012-08-01 (×3): 237 mL via ORAL

## 2012-07-30 MED ORDER — AMLODIPINE BESYLATE 5 MG PO TABS
5.0000 mg | ORAL_TABLET | ORAL | Status: AC
  Administered 2012-07-30: 5 mg via ORAL
  Filled 2012-07-30: qty 1

## 2012-07-30 MED ORDER — BUPIVACAINE LIPOSOME 1.3 % IJ SUSP
20.0000 mL | Freq: Once | INTRAMUSCULAR | Status: DC
  Filled 2012-07-30: qty 20

## 2012-07-30 MED ORDER — KETOROLAC TROMETHAMINE 30 MG/ML IJ SOLN: 15.0000 mg | Freq: Four times a day (QID) | INTRAMUSCULAR | Status: AC

## 2012-07-30 MED ORDER — LACTATED RINGERS IV SOLN
INTRAVENOUS | Status: DC | PRN
  Administered 2012-07-30 (×2): via INTRAVENOUS

## 2012-07-30 MED ORDER — KCL IN DEXTROSE-NACL 20-5-0.45 MEQ/L-%-% IV SOLN
INTRAVENOUS | Status: DC
  Administered 2012-07-30: 14:00:00 via INTRAVENOUS
  Filled 2012-07-30 (×2): qty 1000

## 2012-07-30 MED ORDER — MAGNESIUM HYDROXIDE 400 MG/5ML PO SUSP
30.0000 mL | Freq: Three times a day (TID) | ORAL | Status: AC
  Administered 2012-07-30 – 2012-07-31 (×3): 30 mL via ORAL
  Filled 2012-07-30 (×3): qty 30

## 2012-07-30 MED ORDER — SUFENTANIL CITRATE 50 MCG/ML IV SOLN
INTRAVENOUS | Status: DC | PRN
  Administered 2012-07-30: 15 ug via INTRAVENOUS
  Administered 2012-07-30 (×2): 5 ug via INTRAVENOUS
  Administered 2012-07-30: 15 ug via INTRAVENOUS
  Administered 2012-07-30: 10 ug via INTRAVENOUS

## 2012-07-30 MED ORDER — PROPOFOL 10 MG/ML IV BOLUS
INTRAVENOUS | Status: DC | PRN
  Administered 2012-07-30: 200 mg via INTRAVENOUS

## 2012-07-30 MED ORDER — ZOLPIDEM TARTRATE 5 MG PO TABS: 5.0000 mg | ORAL_TABLET | Freq: Every evening | ORAL | Status: DC | PRN

## 2012-07-30 MED ORDER — CEFAZOLIN SODIUM-DEXTROSE 2-3 GM-% IV SOLR
2.0000 g | INTRAVENOUS | Status: AC
  Administered 2012-07-30: 2 g via INTRAVENOUS

## 2012-07-30 MED ORDER — 0.9 % SODIUM CHLORIDE (POUR BTL) OPTIME
TOPICAL | Status: DC | PRN
  Administered 2012-07-30: 2000 mL

## 2012-07-30 MED ORDER — ACETAMINOPHEN 500 MG PO TABS
1000.0000 mg | ORAL_TABLET | Freq: Four times a day (QID) | ORAL | Status: DC
  Administered 2012-07-30 – 2012-07-31 (×2): 1000 mg via ORAL
  Filled 2012-07-30 (×6): qty 2

## 2012-07-30 MED ORDER — ONDANSETRON HCL 4 MG/2ML IJ SOLN
INTRAMUSCULAR | Status: DC | PRN
  Administered 2012-07-30: 4 mg via INTRAVENOUS

## 2012-07-30 MED ORDER — HYDROMORPHONE HCL PF 1 MG/ML IJ SOLN
INTRAMUSCULAR | Status: DC | PRN
  Administered 2012-07-30: .5 mg via INTRAVENOUS
  Administered 2012-07-30: 1 mg via INTRAVENOUS
  Administered 2012-07-30: .5 mg via INTRAVENOUS

## 2012-07-30 MED ORDER — HYDROMORPHONE HCL PF 1 MG/ML IJ SOLN
0.5000 mg | INTRAMUSCULAR | Status: DC | PRN
  Administered 2012-07-30 – 2012-07-31 (×4): 0.5 mg via INTRAVENOUS
  Filled 2012-07-30 (×5): qty 1

## 2012-07-30 MED ORDER — LIDOCAINE HCL (CARDIAC) 20 MG/ML IV SOLN
INTRAVENOUS | Status: DC | PRN
  Administered 2012-07-30: 60 mg via INTRAVENOUS

## 2012-07-30 MED ORDER — ONDANSETRON HCL 4 MG PO TABS
4.0000 mg | ORAL_TABLET | Freq: Four times a day (QID) | ORAL | Status: DC | PRN
  Administered 2012-07-30: 4 mg via ORAL

## 2012-07-30 MED ORDER — MIDAZOLAM HCL 5 MG/5ML IJ SOLN
INTRAMUSCULAR | Status: DC | PRN
  Administered 2012-07-30: 2 mg via INTRAVENOUS

## 2012-07-30 MED ORDER — LACTATED RINGERS IV SOLN: INTRAVENOUS | Status: DC

## 2012-07-30 MED ORDER — KETOROLAC TROMETHAMINE 30 MG/ML IJ SOLN
15.0000 mg | Freq: Four times a day (QID) | INTRAMUSCULAR | Status: AC
  Administered 2012-07-30 – 2012-07-31 (×4): 15 mg via INTRAVENOUS
  Filled 2012-07-30 (×3): qty 1

## 2012-07-30 MED ORDER — IRBESARTAN 300 MG PO TABS
300.0000 mg | ORAL_TABLET | Freq: Every day | ORAL | Status: DC
  Administered 2012-07-31 – 2012-08-02 (×3): 300 mg via ORAL
  Filled 2012-07-30 (×3): qty 1

## 2012-07-30 SURGICAL SUPPLY — 47 items
ATTRACTOMAT 16X20 MAGNETIC DRP (DRAPES) ×2 IMPLANT
BAG URINE DRAINAGE (UROLOGICAL SUPPLIES) ×2 IMPLANT
CANISTER SUCTION 2500CC (MISCELLANEOUS) ×2 IMPLANT
CHLORAPREP W/TINT 26ML (MISCELLANEOUS) ×4 IMPLANT
CLIP TI MEDIUM LARGE 6 (CLIP) IMPLANT
CLOTH BEACON ORANGE TIMEOUT ST (SAFETY) ×2 IMPLANT
COVER SURGICAL LIGHT HANDLE (MISCELLANEOUS) ×2 IMPLANT
DRAIN CHANNEL 10F 3/8 F FF (DRAIN) ×2 IMPLANT
DRAPE UTILITY XL STRL (DRAPES) ×2 IMPLANT
DRAPE WARM FLUID 44X44 (DRAPE) ×2 IMPLANT
DRSG TELFA 4X14 ISLAND ADH (GAUZE/BANDAGES/DRESSINGS) ×2 IMPLANT
ELECT BLADE 6.5 EXT (BLADE) ×2 IMPLANT
ELECT REM PT RETURN 9FT ADLT (ELECTROSURGICAL) ×2
ELECTRODE REM PT RTRN 9FT ADLT (ELECTROSURGICAL) ×1 IMPLANT
EVACUATOR SILICONE 100CC (DRAIN) ×2 IMPLANT
GAUZE SPONGE 4X4 16PLY XRAY LF (GAUZE/BANDAGES/DRESSINGS) ×2 IMPLANT
GLOVE BIOGEL M STRL SZ7.5 (GLOVE) ×10 IMPLANT
GOWN PREVENTION PLUS LG XLONG (DISPOSABLE) ×2 IMPLANT
GOWN STRL NON-REIN LRG LVL3 (GOWN DISPOSABLE) ×2 IMPLANT
GOWN STRL REIN XL XLG (GOWN DISPOSABLE) ×2 IMPLANT
LIGASURE IMPACT 36 18CM CVD LR (INSTRUMENTS) ×2 IMPLANT
NS IRRIG 1000ML POUR BTL (IV SOLUTION) ×4 IMPLANT
PACK ABDOMINAL WL (CUSTOM PROCEDURE TRAY) ×2 IMPLANT
SHEET LAVH (DRAPES) ×2 IMPLANT
SPONGE DRAIN TRACH 4X4 STRL 2S (GAUZE/BANDAGES/DRESSINGS) ×2 IMPLANT
SPONGE LAP 18X18 X RAY DECT (DISPOSABLE) ×2 IMPLANT
STAPLER SKIN PROX WIDE 3.9 (STAPLE) IMPLANT
STAPLER VISISTAT (STAPLE) ×2 IMPLANT
SUT ETHILON 1 LR 30 (SUTURE) IMPLANT
SUT ETHILON 3 0 PS 1 (SUTURE) ×2 IMPLANT
SUT PDS AB 1 CTXB1 36 (SUTURE) ×4 IMPLANT
SUT SILK 2 0 (SUTURE)
SUT SILK 2 0 30  PSL (SUTURE)
SUT SILK 2 0 30 PSL (SUTURE) IMPLANT
SUT SILK 2-0 18XBRD TIE 12 (SUTURE) IMPLANT
SUT VIC AB 0 CT1 36 (SUTURE) IMPLANT
SUT VIC AB 2-0 CT2 27 (SUTURE) IMPLANT
SUT VIC AB 2-0 SH 27 (SUTURE) ×2
SUT VIC AB 2-0 SH 27X BRD (SUTURE) ×2 IMPLANT
SUT VIC AB 3-0 CTX 36 (SUTURE) IMPLANT
SUT VICRYL 2 0 18  UND BR (SUTURE) ×1
SUT VICRYL 2 0 18 UND BR (SUTURE) ×1 IMPLANT
TAPE CLOTH SURG 4X10 WHT LF (GAUZE/BANDAGES/DRESSINGS) ×2 IMPLANT
TOWEL OR 17X26 10 PK STRL BLUE (TOWEL DISPOSABLE) ×2 IMPLANT
TOWEL OR NON WOVEN STRL DISP B (DISPOSABLE) ×2 IMPLANT
TRAY FOLEY CATH 14FRSI W/METER (CATHETERS) ×2 IMPLANT
WATER STERILE IRR 1500ML POUR (IV SOLUTION) IMPLANT

## 2012-07-30 NOTE — Interval H&P Note (Signed)
History and Physical Interval Note:  07/30/2012 11:02 AM  Kayla Larsen  has presented today for surgery, with the diagnosis of PELVIC MASS  The various methods of treatment have been discussed with the patient and family. After consideration of risks, benefits and other options for treatment, the patient has consented to  Procedure(s): EXPLORATORY LAPAROTOMY, LEFT SALPINGO OOPHERECTOMY, POSSIBLE RIGHT SALPINGECTOMY, POSSIBLE OMENTECTOMY, POSS RIGHT OOPHERECTOMY AND POSSIBLE STAGING WITH DEBULKING (N/A) as a surgical intervention .  The patient's history has been reviewed, patient examined, no change in status, stable for surgery.  I have reviewed the patient's chart and labs.  Questions were answered to the patient's satisfaction.     CLARKE-PEARSON,[name]

## 2012-07-30 NOTE — H&P (View-Only) (Signed)
Consult Note: Gyn-Onc  Consult was requested by Dr. Chevis Pretty for the evaluation of Kayla Larsen 42 y.o. female  CC:  Pelvic mass  Assessment/Plan:  [name]  is a 42 y.o.  year old with a 12 cm pelvic mass that appears to arise from the left adnexa. A CA 125 was ordered. The recommendation was for left salpingo-oophorectomy with possible right salpingectomy. Based on frozen section findings that of surgical procedure could be inclusive of omentectomy contralateral oophorectomy  staging and debulking.  The risks of procedure discussed with patient were that of infection bleeding damage to surrounding structures prolonged hospitalization and reoperation. All of her questions were answered to her satisfaction. She is aware of this procedure will be performed by Dr. De Blanch on 07/30/2012. All of her questions were answered to her satisfaction.   HPI: Kayla Larsen is a 42 y.o.  Who on routine physical examination in may of 2040 was noted have a pelvic mass. A transvaginal ultrasound of the pelvis obtained on 05/28/2012 and was notable for a mass slightly to the left of the midline is multicystic the largest component contains internal echoes and measures 10.0 x 5.5 x 6.8 cm. MRI of the pelvis on 06/10/2012 demonstrates a presence of 12.0 x 10.4 x 7.3 cm complex multicystic mass is no evidence for mural nodule he rectal wall septal thickening a papillary excrescence. There does appear to be residual ovarian tissue along the anterior lateral margin of the mass. Right ovary measured 3.2 x 1.8 x 2.6 cm. No pelvic lymphadenopathy. Diverticular changes were noted in the sigmoid without features of diverticulitis a very trace amount of free fluid was noted in the cul-de-sac  [name] denies any increased abdominal girth is no early satiety change in appetite change in bowel or rectal habits malaise or fatigue.  Current Meds:  Outpatient Encounter Prescriptions as of 07/02/2012   Medication Sig Dispense Refill  . amLODipine-valsartan (EXFORGE) 5-320 MG per tablet Take 1 tablet by mouth daily.      . meloxicam (MOBIC) 7.5 MG tablet Take 7.5 mg by mouth daily.      . Multiple Vitamin (MULTIVITAMIN WITH MINERALS) TABS Take 1 tablet by mouth daily.      Marland Kitchen PENICILLIN V POTASSIUM PO Take by mouth 4 (four) times daily.       No facility-administered encounter medications on file as of 07/02/2012.    Allergy: Not on File  Social Hx:   History   Social History  . Marital Status: Single    Spouse Name: N/A    Number of Children: N/A  . Years of Education: N/A   Occupational History  . Not on file.   Social History Main Topics  . Smoking status: Former Smoker -- 4 years    Quit date: 09/29/2011  . Smokeless tobacco: Not on file  . Alcohol Use: Yes     Comment: occassional  . Drug Use: No  . Sexually Active: No   Other Topics Concern  . Not on file   Social History Narrative  . No narrative on file    Past Surgical Hx: C-section x2 Hysterectomy 2009  Past Medical Hx: No past medical history on file.  Past Gynecological History:  Gravida 2 para 2 last menstrual period 2009 at the time of hysterectomy for uterine leiomyoma menarche occurred at the age of 42 with regular until hysterectomy. Reports oral contraceptive pill use for 5 years followed by bilateral tubal ligation  Family Hx:  No family history on file.  Review of Systems:  Constitutional  Feels well,  no weight loss  Cardiovascular  No chest pain, shortness of breath, or edema  Pulmonary  No cough or wheeze.  Gastro Intestinal  No nausea, vomitting, or diarrhoea. No bright red blood per rectum, no abdominal pain, change in bowel movement, or constipation. No change in appetite no early satiety Genito Urinary  No frequency, urgency, dysuria, Musculo Skeletal  No myalgia, arthralgia, joint swelling or pain  Neurologic  No weakness, numbness, change in gait,  Psychology  No  depression, anxiety, insomnia.   Vitals:  BP 152/94  Pulse 64  Temp(Src) 99.2 F (37.3 C) (Oral)  Resp 18  Ht 5\' 4"  (1.626 m)  Wt 241 lb 14.4 oz (109.725 kg)  BMI 41.5 kg/m2  Physical Exam: WD in NAD Neck  Supple NROM, without any enlargements.  Lymph Node Survey No cervical supraclavicular or inguinal adenopathy Cardiovascular  Pulse normal rate, regularity and rhythm. S1 and S2 normal.  Lungs  Clear to auscultation bilateraly, without wheezes/crackles/rhonchi. Good air movement.  Skin  No rash/lesions/breakdown  Psychiatry  Alert and oriented to person, place, and time  Abdomen  Normoactive bowel sounds, abdomen soft, non-tender and obese. Pfannenstiel surgical  site intact without evidence of hernia.  Back No CVA tenderness Genito Urinary  Vulva/vagina: Normal external female genitalia.  No lesions. No discharge or bleeding.  Bladder/urethra:  No lesions or masses  Vagina: Well estrogenized no lesion  Adnexa: 13 cm firm mass. Rectal  Good tone, no masses no cul de sac nodularity. Mass not palpable on rectal Extremities  No bilateral cyanosis, clubbing or edema.   Laurette Schimke, MD, PhD 07/02/2012, 5:00 PM

## 2012-07-30 NOTE — Anesthesia Preprocedure Evaluation (Addendum)
Anesthesia Evaluation  Patient identified by MRN, date of birth, ID band Patient awake    Reviewed: Allergy & Precautions, H&P , NPO status , Patient's Chart, lab work & pertinent test results  Airway Mallampati: II TM Distance: >3 FB Neck ROM: full    Dental no notable dental hx. (+) Teeth Intact and Dental Advisory Given   Pulmonary sleep apnea ,  Borderline OSA        breath sounds clear to auscultation  Pulmonary exam normal       Cardiovascular Exercise Tolerance: Good hypertension, Pt. on medications Rhythm:regular Rate:Normal     Neuro/Psych negative neurological ROS  negative psych ROS   GI/Hepatic negative GI ROS, Neg liver ROS,   Endo/Other  negative endocrine ROSMorbid obesity  Renal/GU negative Renal ROS  negative genitourinary   Musculoskeletal   Abdominal   Peds  Hematology negative hematology ROS (+)   Anesthesia Other Findings   Reproductive/Obstetrics negative OB ROS                          Anesthesia Physical Anesthesia Plan  ASA: II  Anesthesia Plan: General   Post-op Pain Management:    Induction: Intravenous  Airway Management Planned: Oral ETT  Additional Equipment:   Intra-op Plan:   Post-operative Plan: Extubation in OR  Informed Consent: I have reviewed the patients History and Physical, chart, labs and discussed the procedure including the risks, benefits and alternatives for the proposed anesthesia with the patient or authorized representative who has indicated his/her understanding and acceptance.   Dental Advisory Given  Plan Discussed with: CRNA and Surgeon  Anesthesia Plan Comments:         Anesthesia Quick Evaluation

## 2012-07-30 NOTE — Anesthesia Postprocedure Evaluation (Signed)
  Anesthesia Post-op Note  Patient: Kayla Larsen  Procedure(s) Performed: Procedure(s) (LRB): LEFT SALPINGO OOPHERECTOMY,  OMENTECTOMY, AND LYSIS OF ADHESIONS (Left)  Patient Location: PACU  Anesthesia Type: General  Level of Consciousness: awake and alert   Airway and Oxygen Therapy: Patient Spontanous Breathing  Post-op Pain: mild  Post-op Assessment: Post-op Vital signs reviewed, Patient's Cardiovascular Status Stable, Respiratory Function Stable, Patent Airway and No signs of Nausea or vomiting  Last Vitals:  Filed Vitals:   07/30/12 1506  BP: 120/84  Pulse: 67  Temp: 36.7 C  Resp: 16    Post-op Vital Signs: stable   Complications: No apparent anesthesia complications

## 2012-07-30 NOTE — Preoperative (Signed)
Beta Blockers   Reason not to administer Beta Blockers:Not Applicable 

## 2012-07-30 NOTE — Progress Notes (Signed)
RT placed patient on CPAP. Patient stated that she has not worn one in 5 years. She was not sure about her settings. RT placed patient on auto titrate. Patient is tolerating well.

## 2012-07-30 NOTE — Transfer of Care (Signed)
Immediate Anesthesia Transfer of Care Note  Patient: Kayla Larsen  Procedure(s) Performed: Procedure(s): LEFT SALPINGO OOPHERECTOMY,  OMENTECTOMY, AND LYSIS OF ADHESIONS (Left)  Patient Location: PACU  Anesthesia Type:General  Level of Consciousness: awake, alert  and oriented  Airway & Oxygen Therapy: Patient Spontanous Breathing and Patient connected to face mask oxygen  Post-op Assessment: Report given to PACU RN and Post -op Vital signs reviewed and stable  Post vital signs: Reviewed and stable  Complications: No apparent anesthesia complications

## 2012-07-30 NOTE — Op Note (Signed)
Kayla Larsen  female MEDICAL RECORD UE:[id number] DATE OF BIRTH: 1970/11/11 PHYSICIAN: De Blanch, M.D  07/30/2012   OPERATIVE REPORT  PREOPERATIVE DIAGNOSIS: Complex adnexal cyst.  POSTOPERATIVE DIAGNOSIS: Left ovarian cystadenoma with features of endometriosis. Extensive pelvic adhesions  PROCEDURE: Partial omentectomy, extensive lysis of adhesions (in excess of 45 minutes and (and left salpingo-oophorectomy. Left ureteral lysis  SURGEON: De Blanch, M.D ASSISTANT: Antionette Char M.D. ANESTHESIA: Gen. oral tracheal tube ESTIMATED BLOOD LOSS: 100 mL  SURGICAL FINDINGS: At time of exploratory laparotomy the patient was found to have dense adhesions of the omentum to the anterior bowel wall. Following lysis of those adhesions the omentum had a window in it therefore a portion of the omentum was excised to eliminate the window. In the pelvis the sigmoid colon was densely adherent to the bladder flap and the pelvic sidewall initially securing the ovarian cyst. The ovarian cyst was adherent to all surfaces of the pelvic sidewall as well as the sigmoid mesentery. The ureter was intimately and carried to the ovarian cyst with retroperitoneal fibrosis.  PROCEDURE: Patient brought to the operating room and after satisfactory attainment of general anesthesia was placed in a modified lithotomy position in [place] stirrups. The anterior abdominal wall perineum and vagina were prepped, a Foley catheter was inserted, and the patient was draped. Surgical timeout was taken. Administration of antibiotics was accomplished. Patient had SCDs in place. The abdomen was entered through a midline incision. Initially peritoneal washings were obtained for cytology but when the frozen section report showed a benign ovarian cyst the washings were discarded. We initially encountered the omentum adherent to the anterior abdominal wall parietal peritoneum. This was freed with sharp and blunt  dissection and Bovie cautery. A window in the omentum was identified in order to reduce risk of small bowel obstruction he distal portion the omentum including a window was excised using the LigaSure.  Bookwalter retractor was positioned. Extensive lysis of adhesions in excess of 45 minutes was required in order to move the sigmoid colon away from the pelvic sidewall, bladder flap, and ovarian cyst. Ultimately the left ridge peritoneal space was opened. The ureter was identified. The ovarian vessels were identified skeletonized and then divided using the LigaSure. We continued to dissect extensive adhesions which basically encompass the entire ovarian cyst down deep into the pelvis. Throughout the dissection care is taken to avoid injury to the ureter. Ureteral lysis was required in order to mobilize the ureter away from the fibrotic retroperitoneal tissue adjacent to the cyst. Once the cyst was removed it was submitted to pathology for frozen section which returned as a benign serous cystadenoma.  The pelvis was inspected and hemostasis achieved with cautery. We were unable to identify the right ovary because of adhesions on the right side the pelvis. A loop of small bowel was densely adherent to the lower right pelvic sidewall with a free space beneath it. In order to avoid an internal hernia the small bowel adhesions were lysed freeing the small bowel away from its attachment to the pelvic sidewall. The pelvis was reinspected as was the remainder the abdomen found to be hemostatic.  The anterior bowel wall was closed in layers the first being a running mass closure using #1 PDS. A 10 flat Jackson-Pratt drain was placed above the fascia and exited through a stab incision in the left upper quadrant. The drain was sutured to the skin with 3-0 nylon. Subcutaneous tissue was irrigated.  Experil was injected subcutaneously. Skin was closed skin  staples a dressing was applied patient was awakened from anesthesia  and taken to the recovery room in satisfactory condition sponge needle and isthmic counts correct x2  De Blanch, M.D

## 2012-07-30 NOTE — Interval H&P Note (Signed)
History and Physical Interval Note:  07/30/2012 10:51 AM  Kayla Larsen  has presented today for surgery, with the diagnosis of PELVIC MASS  The various methods of treatment have been discussed with the patient and family. After consideration of risks, benefits and other options for treatment, the patient has consented to  Procedure(s): EXPLORATORY LAPAROTOMY, LEFT SALPINGO OOPHERECTOMY, POSSIBLE RIGHT SALPINGECTOMY, POSSIBLE OMENTECTOMY, POSS RIGHT OOPHERECTOMY AND POSSIBLE STAGING WITH DEBULKING (N/A) as a surgical intervention .  The patient's history has been reviewed, patient examined, no change in status, stable for surgery.  I have reviewed the patient's chart and labs.  Questions were answered to the patient's satisfaction.     CLARKE-PEARSON,Marykathleen Russi L

## 2012-07-31 ENCOUNTER — Encounter (HOSPITAL_COMMUNITY): Payer: Self-pay | Admitting: Gynecology

## 2012-07-31 DIAGNOSIS — R1900 Intra-abdominal and pelvic swelling, mass and lump, unspecified site: Secondary | ICD-10-CM | POA: Diagnosis present

## 2012-07-31 LAB — CBC
HCT: 30.4 % — ABNORMAL LOW (ref 36.0–46.0)
MCH: 22.6 pg — ABNORMAL LOW (ref 26.0–34.0)
MCV: 71.5 fL — ABNORMAL LOW (ref 78.0–100.0)
RBC: 4.25 MIL/uL (ref 3.87–5.11)
WBC: 8.1 10*3/uL (ref 4.0–10.5)

## 2012-07-31 LAB — BASIC METABOLIC PANEL
CO2: 31 mEq/L (ref 19–32)
Chloride: 99 mEq/L (ref 96–112)
Glucose, Bld: 103 mg/dL — ABNORMAL HIGH (ref 70–99)
Sodium: 138 mEq/L (ref 135–145)

## 2012-07-31 MED ORDER — IBUPROFEN 600 MG PO TABS
600.0000 mg | ORAL_TABLET | Freq: Four times a day (QID) | ORAL | Status: DC
  Administered 2012-07-31 – 2012-08-02 (×8): 600 mg via ORAL
  Filled 2012-07-31 (×11): qty 1

## 2012-07-31 NOTE — Care Management Note (Signed)
    Page 1 of 1   07/31/2012     11:40:59 AM   CARE MANAGEMENT NOTE 07/31/2012  Patient:  Kayla Larsen, Kayla Larsen   Account Number:  [id number]  Date Initiated:  07/31/2012  Documentation initiated by:  Lorenda Ishihara  Subjective/Objective Assessment:   41 yo female admitted s/p omenectomy, lysis of adhesions, left oopherectomy. PTA lived at home with alone.     Action/Plan:   Home when stable   Anticipated DC Date:  08/03/2012   Anticipated DC Plan:  HOME/SELF CARE      DC Planning Services  CM consult      Choice offered to / List presented to:             Status of service:  Completed, signed off Medicare Important Message given?   (If response is "NO", the following Medicare IM given date fields will be blank) Date Medicare IM given:   Date Additional Medicare IM given:    Discharge Disposition:  HOME/SELF CARE  Per UR Regulation:  Reviewed for med. necessity/level of care/duration of stay  If discussed at Long Length of Stay Meetings, dates discussed:    Comments:

## 2012-07-31 NOTE — Progress Notes (Signed)
Nutrition Brief Note  Patient identified on the Malnutrition Screening Tool (MST) Report  Body mass index is 37.74 kg/(m^2). Patient meets criteria for class II obesity based on current BMI.   Current diet order is regular, patient is consuming approximately 100% of meals at this time. Labs and medications reviewed. Potassium slightly low. POD#1 left salpingo oopherectomy, omentectomy, and lysis of adhesions for left ovarian cystadenoma. Met with pt who reports eating well PTA, 3 meals/day with good appetite. Pt reports weight has been stable at around 220 pounds. Pt eating excellent and without any nutrition concerns.   No nutrition interventions warranted at this time. If nutrition issues arise, please consult RD.   Levon Hedger MS, RD, LDN [phone] Pager [phone] After Hours Pager

## 2012-07-31 NOTE — Progress Notes (Signed)
Pt stated that she didn't need any help with her CPAP tonight.  RT checked machine and water chamber and everything looks good.  Pt notified to call if any problems, RT to monitor and assess as needed.

## 2012-07-31 NOTE — Progress Notes (Signed)
1 Day Post-Op Procedure(s) (LRB): LEFT SALPINGO OOPHERECTOMY,  OMENTECTOMY, AND LYSIS OF ADHESIONS (Left)  Subjective: Patient reports tolerating diet and ambulating without difficulty.  Minimal pain reported.  Denies nausea, emesis, chest pain, dyspnea, passing flatus, or having a bowel movement.  No concerns voiced.  Objective: Vital signs in last 24 hours: Temp:  [98 F (36.7 C)-99.4 F (37.4 C)] 99.4 F (37.4 C) (07/23 0548) Pulse Rate:  [63-87] 76 (07/23 0548) Resp:  [10-20] 18 (07/23 0548) BP: (119-164)/(67-91) 146/86 mmHg (07/23 0548) SpO2:  [93 %-100 %] 98 % (07/23 0548) Weight:  [542 lb (245.85 kg)] 542 lb (245.85 kg) (07/22 1506) Last BM Date: 07/29/12  Intake/Output from previous day: 07/22 0701 - 07/23 0700 In: 2890 [P.O.:120; I.V.:2770] Out: 1925 [Urine:1660; Drains:65; Blood:200]  Physical Examination: General: alert, cooperative and no distress Resp: clear to auscultation bilaterally Cardio: regular rate and rhythm, S1, S2 normal, no murmur, click, rub or gallop GI: soft, non-tender; bowel sounds normal; no masses,  no organomegaly and incision: midline incision with staples open to air, minimal amount of sanguinous drainage noted Extremities: extremities normal, atraumatic, no cyanosis or edema JP to left abdomen charged with minimal amount of serosanguinous drainage noted.  Labs: WBC/Hgb/Hct/Plts:  8.1/9.6/30.4/269 (07/23 0404) BUN/Cr/glu/ALT/AST/amyl/lip:  8/0.67/--/--/--/--/-- (07/23 0404)  Assessment: 42 y.o. s/p Procedure(s): LEFT SALPINGO OOPHERECTOMY,  OMENTECTOMY, AND LYSIS OF ADHESIONS: stable Pain:  Pain is well-controlled on PRN medications.  Heme:  Hgb 9.6 and Hct 30.4 this am.    CV:  Hx Hypertension.  Current treatment:  Exforge.  GI:  Tolerating po: Yes     FEN:  K+ 3.3 this am.  Repeat lab in the am.  Pt asymptomatic.  Prophylaxis: intermittent pneumatic compression boots.  Plan: Saline lock IV Regular diet Bmet in the am Encourage  ambulating, IS use, deep breathing, and coughing Continue post-operative plan of care   LOS: 1 day    Chanda Laperle DEAL 07/31/2012, 8:42 AM

## 2012-08-01 LAB — BASIC METABOLIC PANEL
BUN: 10 mg/dL (ref 6–23)
CO2: 29 mEq/L (ref 19–32)
GFR calc non Af Amer: >90 mL/min (ref >90)
Glucose, Bld: 128 mg/dL — ABNORMAL HIGH (ref 70–99)
Potassium: 3.7 mEq/L (ref 3.5–5.1)
Sodium: 140 mEq/L (ref 135–145)

## 2012-08-01 MED ORDER — BISACODYL 10 MG RE SUPP
10.0000 mg | Freq: Once | RECTAL | Status: AC
  Administered 2012-08-01: 10 mg via RECTAL
  Filled 2012-08-01: qty 1

## 2012-08-01 MED ORDER — ACETAMINOPHEN 500 MG PO TABS
1000.0000 mg | ORAL_TABLET | Freq: Four times a day (QID) | ORAL | Status: DC
  Administered 2012-08-01 – 2012-08-02 (×4): 1000 mg via ORAL
  Filled 2012-08-01 (×8): qty 2

## 2012-08-01 NOTE — Progress Notes (Signed)
Pt stated that she did not need any help with her CPAP tonight, RT to monitor and assess as needed.

## 2012-08-01 NOTE — Progress Notes (Signed)
2 Days Post-Op Procedure(s) (LRB): LEFT SALPINGO OOPHERECTOMY,  OMENTECTOMY, AND LYSIS OF ADHESIONS (Left)  Subjective: Patient reports tolerating diet and ambulating without difficulty.  Minimal pain reported, more around the JP site.  Denies nausea, emesis, chest pain, dyspnea, passing flatus, or having a bowel movement.  No concerns voiced.  Requesting a laxative.  Objective: Vital signs in last 24 hours: Temp:  [98 F (36.7 C)-99 F (37.2 C)] 98.1 F (36.7 C) (07/24 0542) Pulse Rate:  [69-83] 81 (07/24 0542) Resp:  [18] 18 (07/24 0542) BP: (108-134)/(70-83) 134/74 mmHg (07/24 0542) SpO2:  [95 %-100 %] 95 % (07/24 0542) Weight:  [220 lb (99.791 kg)] 220 lb (99.791 kg) (07/23 1352) Last BM Date: 07/31/12  Intake/Output from previous day: 07/23 0701 - 07/24 0700 In: 1175 [P.O.:960; I.V.:215] Out: 1105 [Urine:1050; Drains:55]  Physical Examination: General: alert, cooperative and no distress Resp: clear to auscultation bilaterally Cardio: regular rate and rhythm, S1, S2 normal, no murmur, click, rub or gallop GI: soft, non-tender; bowel sounds normal; no masses,  no organomegaly and incision: midline incision with staples open to air Extremities: extremities normal, atraumatic, no cyanosis or edema JP to left abdomen charged with minimal amount of serosanguinous drainage noted.  Assessment: 42 y.o. s/p Procedure(s): LEFT SALPINGO OOPHERECTOMY,  OMENTECTOMY, AND LYSIS OF ADHESIONS: stable Pain:  Pain is well-controlled on PRN medications.  Heme:  Hgb 9.6 and Hct 30.4 on 07/31/12.    CV:  Hx Hypertension.  Current treatment:  Exforge.  GI:  Tolerating po: Yes     FEN:  K+ 3.3 on 07/31/12.  Repeat lab this am.  Pt asymptomatic.  Prophylaxis: intermittent pneumatic compression boots.  Plan: Bmet this am Encourage ambulating, IS use, deep breathing, and coughing Continue post-operative plan of care Plan for discharge when +flatus or + bowel movement   LOS: 2 days     CROSS, MELISSA DEAL 08/01/2012, 9:29 AM

## 2012-08-02 MED ORDER — OXYCODONE HCL 5 MG PO TABS: 5.0000 mg | ORAL_TABLET | ORAL | Status: DC | PRN

## 2012-08-02 NOTE — Discharge Summary (Signed)
Physician Discharge Summary  Patient ID: DEIONA HOOPER MRN: [id number] DOB/AGE: [birth year] 44 y.o.  Admit date: 07/30/2012 Discharge date: 08/02/2012  Admission Diagnoses: Pelvic mass in female  Discharge Diagnoses:  Principal Problem:   Pelvic mass in female  Discharged Condition:  The patient is in good condition and stable for discharge.   Hospital Course:  On 07/30/2012, the patient underwent the following: Procedure(s): LEFT SALPINGO OOPHERECTOMY,  OMENTECTOMY, AND LYSIS OF ADHESIONS.  The postoperative course was uneventful.  She was discharged to home on postoperative day 3 tolerating a regular diet, passing flatus, and having bowel movements.  Consults: None  Significant Diagnostic Studies: None  Treatments: surgery: see above  Discharge Exam: Blood pressure 118/82, pulse 71, temperature 98.2 F (36.8 C), temperature source Oral, resp. rate 16, height 5\' 4"  (1.626 m), weight 220 lb (99.791 kg), SpO2 99.00%. General appearance: alert, cooperative and no distress Resp: clear to auscultation bilaterally Cardio: regular rate and rhythm, S1, S2 normal, no murmur, click, rub or gallop GI: soft, non-tender; bowel sounds normal; no masses,  no organomegaly and abdomen obese, JP drain to left abd removed without difficulty Extremities: extremities normal, atraumatic, no cyanosis or edema Incision/Wound: Midline incision with staples clean, dry, and intact  Disposition: Home  Discharge Orders   Future Appointments Provider Department Dept Phone   08/06/2012 1:00 PM Doylene Bode, NP [facility] [phone]   09/06/2012 12:00 PM Jeannette Corpus, MD [facility] [phone]   Future Orders Complete By Expires     Call MD for:  difficulty breathing, headache or visual disturbances  As directed     Call MD for:  extreme fatigue  As directed     Call MD for:  hives  As directed     Call MD for:   persistant dizziness or light-headedness  As directed     Call MD for:  persistant nausea and vomiting  As directed     Call MD for:  redness, tenderness, or signs of infection (pain, swelling, redness, odor or green/yellow discharge around incision site)  As directed     Call MD for:  severe uncontrolled pain  As directed     Call MD for:  temperature >100.4  As directed     Diet - low sodium heart healthy  As directed     Driving Restrictions  As directed     Comments:      No driving for 2 weeks.  Do not take narcotics and drive.    Increase activity slowly  As directed     Lifting restrictions  As directed     Comments:      No lifting greater than 10 lbs.    Sexual Activity Restrictions  As directed     Comments:      No sexual activity, nothing in the vagina, for 6 weeks.        Medication List         amLODipine-valsartan 5-320 MG per tablet  Commonly known as:  EXFORGE  Take 1 tablet by mouth every morning.     meloxicam 7.5 MG tablet  Commonly known as:  MOBIC  Take 7.5 mg by mouth daily.     multivitamin with minerals Tabs  Take 1 tablet by mouth daily.     oxyCODONE 5 MG immediate release tablet  Commonly known as:  Oxy IR/ROXICODONE  Take 1 tablet (5 mg total) by mouth every 4 (four)  hours as needed.           Follow-up Information   Follow up with Silvana Holecek DEAL, NP On 08/06/2012. (at 1:00pm at the [facility] for staple removal)    Contact information:   [address] [place] [address] [phone]       Follow up with Jeannette Corpus, MD On 09/06/2012. (at 12:00pm at the [facility])    Contact information:   [address] [place] [address] [phone]       Greater than thirty minutes were spend for face to face discharge instructions and discharge orders/summary in EPIC.   Signed: Kaytlynn Kochan DEAL 08/02/2012, 9:10 AM

## 2012-08-06 ENCOUNTER — Encounter: Payer: Self-pay | Admitting: Gynecologic Oncology

## 2012-08-06 ENCOUNTER — Ambulatory Visit: Payer: No Typology Code available for payment source | Attending: Gynecologic Oncology | Admitting: Gynecologic Oncology

## 2012-08-06 VITALS — BP 136/82 | Temp 98.6°F | Resp 20 | Ht 64.0 in | Wt 241.2 lb

## 2012-08-06 DIAGNOSIS — R1900 Intra-abdominal and pelvic swelling, mass and lump, unspecified site: Secondary | ICD-10-CM

## 2012-08-06 MED ORDER — OXYCODONE HCL 5 MG PO TABS: 5.0000 mg | ORAL_TABLET | ORAL | Status: DC | PRN

## 2012-08-06 NOTE — Progress Notes (Signed)
Follow Up Note: Gyn-Onc  Kayla Larsen 42 y.o. female  CC:  Chief Complaint  Patient presents with  . Staple Removal, Post-op follow up    Follow up    HPI:  Kayla Larsen is a 42 year old female, who on routine physical examination in May of 2014 was noted to have a pelvic mass. A transvaginal ultrasound of the pelvis was obtained on 05/28/2012 and was notable for a multicystic mass slightly to the left of the midline with the largest component containing internal echoes and measures 10.0 x 5.5 x 6.8 cm. MRI of the pelvis on 06/10/2012 demonstrated a presence of 12.0 x 10.4 x 7.3 cm complex multicystic mass with no evidence for mural nodularity, septal thickening/irregularity, or papillary excrescence. There does appear to be residual ovarian tissue along the anterior lateral margin of the mass. Right ovary measured 3.2 x 1.8 x 2.6 cm. No pelvic lymphadenopathy. Diverticular changes were noted in the sigmoid without features of diverticulitis with a very trace amount of free fluid noted in the cul-de-sac.  On 07/30/12, she underwent a partial omentectomy, extensive lysis of adhesions (in excess of 45 minutes), left salpingo-oophorectomy, and left ureteral lysis by Dr. Stanford Breed.  Final pathology revealed ovarian tissue with an endometriotic cyst, dilated fallopian tube consistent with hydrosalpinx, with no atypia or malignancy.  Her post-operative course was uneventful.   Interval History:  She presents today for post-operative follow up and for staple removal.  She describes expected post operative status.  Adequate PO intake reported.  Bowels and bladder functioning without difficulty.  Pain moderate at times.  Reporting the use of oxycodone alternated with ibuprofen.  No concerns voiced.    Review of Systems  Constitutional: Feels well.  No fever, chills, change in appetite, change in weight, or early  satiety. Cardiovascular: No chest pain, shortness of breath, or edema.  Pulmonary: No cough or wheeze.  Gastrointestinal: No nausea, vomiting, or diarrhea. No bright red blood per rectum or change in bowel movement.  Genitourinary: No frequency, urgency, or dysuria. No vaginal bleeding or discharge.  Musculoskeletal: No myalgia or joint pain. Neurologic: No weakness, numbness, or change in gait.  Psychology: No depression, anxiety, or insomnia.  Current Meds:  Outpatient Encounter Prescriptions as of 08/06/2012  Medication Sig Dispense Refill  . amLODipine-valsartan (EXFORGE) 5-320 MG per tablet Take 1 tablet by mouth every morning.       . Multiple Vitamin (MULTIVITAMIN WITH MINERALS) TABS Take 1 tablet by mouth daily.      Marland Kitchen oxyCODONE (OXY IR/ROXICODONE) 5 MG immediate release tablet Take 1 tablet (5 mg total) by mouth every 4 (four) hours as needed.  15 tablet  0  . [DISCONTINUED] oxyCODONE (OXY IR/ROXICODONE) 5 MG immediate release tablet Take 1 tablet (5 mg total) by mouth every 4 (four) hours as needed.  30 tablet  0  . meloxicam (MOBIC) 7.5 MG tablet Take 7.5 mg by mouth daily.       No facility-administered encounter medications on file as of 08/06/2012.    Allergy: No Known Allergies  Social Hx:   History   Social History  . Marital Status: Single    Spouse Name: N/A    Number of Children: N/A  . Years of Education: N/A   Occupational History  . Not on file.   Social History Main Topics  . Smoking status: Former Smoker -- 0.25 packs/day for 4 years    Types: Cigarettes    Quit date: 09/29/2011  .  Smokeless tobacco: Never Used  . Alcohol Use: Yes     Comment: occassional  . Drug Use: No  . Sexually Active: No   Other Topics Concern  . Not on file   Social History Narrative  . No narrative on file    Past Surgical Hx:  Past Surgical History  Procedure Laterality Date  . Abdominal hysterectomy  2009  . Cesarean section      x 2  . Laparotomy Left  07/30/2012    Procedure: LEFT SALPINGO OOPHERECTOMY,  OMENTECTOMY, AND LYSIS OF ADHESIONS;  Surgeon: Jeannette Corpus, MD;  Location: [facility];  Service: Gynecology;  Laterality: Left;    Past Medical Hx:  Past Medical History  Diagnosis Date  . Hypertension   . Sleep apnea     borderline np cpap used  . History of blood transfusion 2009    2 units after hysterectomy    Family Hx: History reviewed. No pertinent family history.  Vitals:  Blood pressure 136/82, temperature 98.6 F (37 C), resp. rate 20, height 5\' 4"  (1.626 m), weight 241 lb 3.2 oz (109.408 kg).  Physical Exam:  General: Well developed, well nourished female in no acute distress. Alert and oriented x 3.  Neck: Supple without any enlargements.  Lymph node survey: No cervical, supraclavicular, or inguinal adenopathy.  Cardiovascular: Regular rate and rhythm. S1 and S2 normal.  Lungs: Clear to auscultation bilaterally. No wheezes/crackles/rhonchi noted.  Skin: No rashes or lesions present. Back: No CVA tenderness.  Abdomen: Abdomen soft, non-tender and morbidly obese. Active bowel sounds in all quadrants.  19 staples removed from the midline incision without difficulty.  Steri strips applied.  No drainage or erythema noted.  Incision intact, clean, and dry.  JP site incision to the left abdomen well healed with no drainage or erythema.  Extremities: No bilateral cyanosis, edema, or clubbing.   Assessment/Plan:  42 year old s/p a partial omentectomy, extensive lysis of adhesions (in excess of 45 minutes), left salpingo-oophorectomy, and left ureteral lysis by Dr. Stanford Breed on 07/30/12.  Final pathology revealed ovarian tissue with an endometriotic cyst, dilated fallopian tube consistent with hydrosalpinx, with no atypia or malignancy.  She is doing well post-operatively.  A refill on oxycodone 5 mg one to two tablets every six hours as needed for moderate to severe pain, #15, 0 refills given along with the  instructions to begin incorporating ibuprofen or tylenol more often for mild pain.  She is advised to keep her follow up appointment with Dr. Stanford Breed on 09/06/12.  Incision care reinforced along with post-operative activity restrictions.  Reportable signs and symptoms reviewed.  She is advised to call for any questions or concerns.      Kayla Esguerra DEAL, NP 08/06/2012, 2:53 PM

## 2012-08-06 NOTE — Patient Instructions (Addendum)
Doing well.  Plan to follow up as scheduled.  Please call for any concerns.

## 2012-09-06 ENCOUNTER — Encounter: Payer: Self-pay | Admitting: Gynecology

## 2012-09-06 ENCOUNTER — Ambulatory Visit: Payer: No Typology Code available for payment source | Attending: Gynecology | Admitting: Gynecology

## 2012-09-06 ENCOUNTER — Encounter: Payer: Self-pay | Admitting: Gynecologic Oncology

## 2012-09-06 VITALS — BP 142/83 | HR 76 | Temp 99.8°F | Resp 18 | Ht 64.0 in | Wt 243.1 lb

## 2012-09-06 DIAGNOSIS — Z79899 Other long term (current) drug therapy: Secondary | ICD-10-CM | POA: Insufficient documentation

## 2012-09-06 DIAGNOSIS — N80109 Endometriosis of ovary, unspecified side, unspecified depth: Secondary | ICD-10-CM | POA: Insufficient documentation

## 2012-09-06 DIAGNOSIS — R1900 Intra-abdominal and pelvic swelling, mass and lump, unspecified site: Secondary | ICD-10-CM

## 2012-09-06 DIAGNOSIS — I10 Essential (primary) hypertension: Secondary | ICD-10-CM | POA: Insufficient documentation

## 2012-09-06 DIAGNOSIS — N801 Endometriosis of ovary: Secondary | ICD-10-CM | POA: Insufficient documentation

## 2012-09-06 DIAGNOSIS — Z90710 Acquired absence of both cervix and uterus: Secondary | ICD-10-CM | POA: Insufficient documentation

## 2012-09-06 NOTE — Progress Notes (Signed)
Consult Note: Gyn-Onc   Kayla Larsen 42 y.o. female  Chief Complaint  Patient presents with  . Pelvic Mass    Follow up    Assessment : Left ovarian endometrioma status post resection. Patient is doing well in may return to full levels of activity.  Plan: Patient may return to work on Tuesday. She return to see Dr. Gaynell Larsen for annual gynecologic exams.  Interval History: Since hospital discharge the patient is done well she has no GI or GU symptoms no pelvic pain pressure or bleeding. Her incision is healed well.  HPI: Patient underwent exploratory laparotomy on 07/30/2012 for complex pelvic mass. She was found to have extensive adhesions. Ultimately once exposed we found the ovarian cyst which on pathology was an endometrioma. She also had a hydrosalpinx. She had an uncomplicated postoperative course.  Review of Systems:10 point review of systems is negative except as noted in interval history.   Vitals: Blood pressure 142/83, pulse 76, temperature 99.8 F (37.7 C), temperature source Oral, resp. rate 18, height 5\' 4"  (1.626 m), weight 243 lb 1.6 oz (110.269 kg).  Physical Exam: General : The patient is a healthy woman in no acute distress.  HEENT: normocephalic, extraoccular movements normal; neck is supple without thyromegally  Lynphnodes: Supraclavicular and inguinal nodes not enlarged  Abdomen: Soft, non-tender, no ascites, no organomegally, no masses, no hernias, incision is well-healed  Pelvic:  EGBUS: Normal female  Vagina: Normal, no lesions  Urethra and Bladder: Normal, non-tender  Cervix: Surgically absent  Uterus: Surgically absent  Bi-manual examination: Non-tender; no adenxal masses or nodularity  Rectal: normal sphincter tone, no masses, no blood  Lower extremities: No edema or varicosities. Normal range of motion      No Known Allergies  Past Medical History  Diagnosis Date  . Hypertension   . Sleep apnea     borderline np cpap used  . History of  blood transfusion 2009    2 units after hysterectomy    Past Surgical History  Procedure Laterality Date  . Abdominal hysterectomy  2009  . Cesarean section      x 2  . Laparotomy Left 07/30/2012    Procedure: LEFT SALPINGO OOPHERECTOMY,  OMENTECTOMY, AND LYSIS OF ADHESIONS;  Surgeon: Jeannette Corpus, MD;  Location: [facility];  Service: Gynecology;  Laterality: Left;    Current Outpatient Prescriptions  Medication Sig Dispense Refill  . amLODipine-valsartan (EXFORGE) 5-320 MG per tablet Take 1 tablet by mouth every morning.       . meloxicam (MOBIC) 7.5 MG tablet Take 7.5 mg by mouth daily.      . Multiple Vitamin (MULTIVITAMIN WITH MINERALS) TABS Take 1 tablet by mouth daily.      Marland Kitchen oxyCODONE (OXY IR/ROXICODONE) 5 MG immediate release tablet Take 1 tablet (5 mg total) by mouth every 4 (four) hours as needed.  15 tablet  0   No current facility-administered medications for this visit.    History   Social History  . Marital Status: Single    Spouse Name: N/A    Number of Children: N/A  . Years of Education: N/A   Occupational History  . Not on file.   Social History Main Topics  . Smoking status: Former Smoker -- 0.25 packs/day for 4 years    Types: Cigarettes    Quit date: 09/29/2011  . Smokeless tobacco: Never Used  . Alcohol Use: Yes     Comment: occassional  . Drug Use: No  . Sexual Activity: No  Other Topics Concern  . Not on file   Social History Narrative  . No narrative on file    No family history on file.    Jeannette Corpus, MD 09/06/2012, 12:27 PM

## 2012-09-06 NOTE — Patient Instructions (Addendum)
Return to Dr. Gaynell Face for future care.  Please call for any questions or concerns.

## 2012-09-11 ENCOUNTER — Encounter: Payer: Self-pay | Admitting: Gynecologic Oncology

## 2012-11-14 ENCOUNTER — Other Ambulatory Visit: Payer: Self-pay

## 2013-02-04 ENCOUNTER — Encounter: Payer: No Typology Code available for payment source | Admitting: Medical

## 2013-02-12 ENCOUNTER — Ambulatory Visit (INDEPENDENT_AMBULATORY_CARE_PROVIDER_SITE_OTHER): Payer: BC Managed Care – PPO | Admitting: Medical

## 2013-02-12 ENCOUNTER — Encounter: Payer: Self-pay | Admitting: Medical

## 2013-02-12 VITALS — BP 130/88 | HR 78 | Temp 98.2°F | Resp 18 | Ht 64.0 in | Wt 240.0 lb

## 2013-02-12 DIAGNOSIS — R609 Edema, unspecified: Secondary | ICD-10-CM

## 2013-02-12 DIAGNOSIS — Z0000 Encounter for general adult medical examination without abnormal findings: Secondary | ICD-10-CM

## 2013-02-12 DIAGNOSIS — E669 Obesity, unspecified: Secondary | ICD-10-CM

## 2013-02-12 DIAGNOSIS — Z23 Encounter for immunization: Secondary | ICD-10-CM

## 2013-02-12 DIAGNOSIS — I10 Essential (primary) hypertension: Secondary | ICD-10-CM

## 2013-02-12 LAB — COMPREHENSIVE METABOLIC PANEL
ALK PHOS: 49 U/L (ref 39–117)
ALT: 23 U/L (ref 0–35)
AST: 17 U/L (ref 0–37)
Albumin: 4.2 g/dL (ref 3.5–5.2)
BILIRUBIN TOTAL: 0.6 mg/dL (ref 0.2–1.2)
BUN: 11 mg/dL (ref 6–23)
CO2: 28 mEq/L (ref 19–32)
CREATININE: 0.58 mg/dL (ref 0.50–1.10)
Calcium: 9.6 mg/dL (ref 8.4–10.5)
Chloride: 103 mEq/L (ref 96–112)
Glucose, Bld: 109 mg/dL — ABNORMAL HIGH (ref 70–99)
Potassium: 3.7 mEq/L (ref 3.5–5.3)
Sodium: 142 mEq/L (ref 135–145)
Total Protein: 7.5 g/dL (ref 6.0–8.3)

## 2013-02-12 LAB — POCT URINALYSIS DIPSTICK
BILIRUBIN UA: NEGATIVE
Blood, UA: NEGATIVE
Glucose, UA: NEGATIVE
Ketones, UA: NEGATIVE
LEUKOCYTES UA: NEGATIVE
NITRITE UA: NEGATIVE
PH UA: 5
Spec Grav, UA: 1.01
UROBILINOGEN UA: NEGATIVE

## 2013-02-12 LAB — CBC WITH DIFFERENTIAL/PLATELET
BASOS ABS: 0 10*3/uL (ref 0.0–0.1)
BASOS PCT: 0 % (ref 0–1)
EOS PCT: 2 % (ref 0–5)
Eosinophils Absolute: 0.1 10*3/uL (ref 0.0–0.7)
HEMATOCRIT: 35.7 % — AB (ref 36.0–46.0)
Hemoglobin: 11.3 g/dL — ABNORMAL LOW (ref 12.0–15.0)
Lymphocytes Relative: 46 % (ref 12–46)
Lymphs Abs: 2.9 10*3/uL (ref 0.7–4.0)
MCH: 22.7 pg — ABNORMAL LOW (ref 26.0–34.0)
MCHC: 31.7 g/dL (ref 30.0–36.0)
MCV: 71.8 fL — AB (ref 78.0–100.0)
MONO ABS: 0.6 10*3/uL (ref 0.1–1.0)
Monocytes Relative: 10 % (ref 3–12)
Neutro Abs: 2.7 10*3/uL (ref 1.7–7.7)
Neutrophils Relative %: 42 % — ABNORMAL LOW (ref 43–77)
Platelets: 346 10*3/uL (ref 150–400)
RBC: 4.97 MIL/uL (ref 3.87–5.11)
RDW: 16.6 % — AB (ref 11.5–15.5)
WBC: 6.3 10*3/uL (ref 4.0–10.5)

## 2013-02-12 LAB — LIPID PANEL
Cholesterol: 142 mg/dL (ref 0–200)
HDL: 49 mg/dL (ref >39)
LDL CALC: 50 mg/dL (ref 0–99)
Total CHOL/HDL Ratio: 2.9 Ratio
Triglycerides: 214 mg/dL — ABNORMAL HIGH (ref <150)
VLDL: 43 mg/dL — AB (ref 0–40)

## 2013-02-12 LAB — HEMOGLOBIN A1C
Hgb A1c MFr Bld: 6 % — ABNORMAL HIGH (ref <5.7)
Mean Plasma Glucose: 126 mg/dL — ABNORMAL HIGH (ref <117)

## 2013-02-12 MED ORDER — LOSARTAN POTASSIUM 25 MG PO TABS
25.0000 mg | ORAL_TABLET | Freq: Every day | ORAL | Status: DC
Start: 2013-02-12 — End: 2013-09-10

## 2013-02-12 NOTE — Patient Instructions (Addendum)
Check insurance coverage for Qsymia and Belviq weight loss medications.  Begin Losartan BP medication, continue Amlodipine.  Stop Hydrochlorothiazide.    Weight loss  Exercise most days of the week  Limit calories to 1800 daily  Use My Fitness Pal on the smart phone to track calories  Eat a healthy low fat diet.  Preventative Care for Adults - Female      MAINTAIN REGULAR HEALTH EXAMS:  A routine yearly physical is a good way to check in with your primary care provider about your health and preventive screening. It is also an opportunity to share updates about your health and any concerns you have, and receive a thorough all-over exam.   Most health insurance companies pay for at least some preventative services.  Check with your health plan for specific coverages.  WHAT PREVENTATIVE SERVICES DO WOMEN NEED?  Adult women should have their weight and blood pressure checked regularly.   Women age 58 and older should have their cholesterol levels checked regularly.  Women should be screened for cervical cancer with a Pap smear and pelvic exam beginning at either age 68, or 3 years after they become sexually activity.    Breast cancer screening generally begins at age 4 with a mammogram and breast exam by your primary care provider.    Beginning at age 17 and continuing to age 85, women should be screened for colorectal cancer.  Certain people may need continued testing until age 73.  Updating vaccinations is part of preventative care.  Vaccinations help protect against diseases such as the flu.  Osteoporosis is a disease in which the bones lose minerals and strength as we age. Women ages 80 and over should discuss this with their caregivers, as should women after menopause who have other risk factors.  Lab tests are generally done as part of preventative care to screen for anemia and blood disorders, to screen for problems with the kidneys and liver, to screen for bladder problems,  to check blood sugar, and to check your cholesterol level.  Preventative services generally include counseling about diet, exercise, avoiding tobacco, drugs, excessive alcohol consumption, and sexually transmitted infections.    GENERAL RECOMMENDATIONS FOR GOOD HEALTH:  Healthy diet:  Eat a variety of foods, including fruit, vegetables, animal or vegetable protein, such as meat, fish, chicken, and eggs, or beans, lentils, tofu, and grains, such as rice.  Drink plenty of water daily.  Decrease saturated fat in the diet, avoid lots of red meat, processed foods, sweets, fast foods, and fried foods.  Exercise:  Aerobic exercise helps maintain good heart health. At least 30-40 minutes of moderate-intensity exercise is recommended. For example, a brisk walk that increases your heart rate and breathing. This should be done on most days of the week.   Find a type of exercise or a variety of exercises that you enjoy so that it becomes a part of your daily life.  Examples are running, walking, swimming, water aerobics, and biking.  For motivation and support, explore group exercise such as aerobic class, spin class, Zumba, Yoga,or  martial arts, etc.    Set exercise goals for yourself, such as a certain weight goal, walk or run in a race such as a 5k walk/run.  Speak to your primary care provider about exercise goals.  Disease prevention:  If you smoke or chew tobacco, find out from your caregiver how to quit. It can literally save your life, no matter how long you have been a tobacco user.  If you do not use tobacco, never begin.   Maintain a healthy diet and normal weight. Increased weight leads to problems with blood pressure and diabetes.   The Body Mass Index or BMI is a way of measuring how much of your body is fat. Having a BMI above 27 increases the risk of heart disease, diabetes, hypertension, stroke and other problems related to obesity. Your caregiver can help determine your BMI and  based on it develop an exercise and dietary program to help you achieve or maintain this important measurement at a healthful level.  High blood pressure causes heart and blood vessel problems.  Persistent high blood pressure should be treated with medicine if weight loss and exercise do not work.   Fat and cholesterol leaves deposits in your arteries that can block them. This causes heart disease and vessel disease elsewhere in your body.  If your cholesterol is found to be high, or if you have heart disease or certain other medical conditions, then you may need to have your cholesterol monitored frequently and be treated with medication.   Ask if you should have a cardiac stress test if your history suggests this. A stress test is a test done on a treadmill that looks for heart disease. This test can find disease prior to there being a problem.  Menopause can be associated with physical symptoms and risks. Hormone replacement therapy is available to decrease these. You should talk to your caregiver about whether starting or continuing to take hormones is right for you.   Osteoporosis is a disease in which the bones lose minerals and strength as we age. This can result in serious bone fractures. Risk of osteoporosis can be identified using a bone density scan. Women ages 7 and over should discuss this with their caregivers, as should women after menopause who have other risk factors. Ask your caregiver whether you should be taking a calcium supplement and Vitamin D, to reduce the rate of osteoporosis.   Avoid drinking alcohol in excess (more than two drinks per day).  Avoid use of street drugs. Do not share needles with anyone. Ask for professional help if you need assistance or instructions on stopping the use of alcohol, cigarettes, and/or drugs.  Brush your teeth twice a day with fluoride toothpaste, and floss once a day. Good oral hygiene prevents tooth decay and gum disease. The problems can be  painful, unattractive, and can cause other health problems. Visit your dentist for a routine oral and dental check up and preventive care every 6-12 months.   Look at your skin regularly.  Use a mirror to look at your back. Notify your caregivers of changes in moles, especially if there are changes in shapes, colors, a size larger than a pencil eraser, an irregular border, or development of new moles.  Safety:  Use seatbelts 100% of the time, whether driving or as a passenger.  Use safety devices such as hearing protection if you work in environments with loud noise or significant background noise.  Use safety glasses when doing any work that could send debris in to the eyes.  Use a helmet if you ride a bike or motorcycle.  Use appropriate safety gear for contact sports.  Talk to your caregiver about gun safety.  Use sunscreen with a SPF (or skin protection factor) of 15 or greater.  Lighter skinned people are at a greater risk of skin cancer. Don't forget to also wear sunglasses in order to protect your eyes  from too much damaging sunlight. Damaging sunlight can accelerate cataract formation.   Practice safe sex. Use condoms. Condoms are used for birth control and to help reduce the spread of sexually transmitted infections (or STIs).  Some of the STIs are gonorrhea (the clap), chlamydia, syphilis, trichomonas, herpes, HPV (human papilloma virus) and HIV (human immunodeficiency virus) which causes AIDS. The herpes, HIV and HPV are viral illnesses that have no cure. These can result in disability, cancer and death.   Keep carbon monoxide and smoke detectors in your home functioning at all times. Change the batteries every 6 months or use a model that plugs into the wall.   Vaccinations:  Stay up to date with your tetanus shots and other required immunizations. You should have a booster for tetanus every 10 years. Be sure to get your flu shot every year, since 5%-20% of the [place] population comes down  with the flu. The flu vaccine changes each year, so being vaccinated once is not enough. Get your shot in the fall, before the flu season peaks.   Other vaccines to consider:  Human Papilloma Virus or HPV causes cancer of the cervix, and other infections that can be transmitted from person to person. There is a vaccine for HPV, and females should get immunized between the ages of 20 and 8. It requires a series of 3 shots.   Pneumococcal vaccine to protect against certain types of pneumonia.  This is normally recommended for adults age 93 or older.  However, adults younger than 45 years old with certain underlying conditions such as diabetes, heart or lung disease should also receive the vaccine.  Shingles vaccine to protect against Varicella Zoster if you are older than age 28, or younger than 45 years old with certain underlying illness.  Hepatitis A vaccine to protect against a form of infection of the liver by a virus acquired from food.  Hepatitis B vaccine to protect against a form of infection of the liver by a virus acquired from blood or body fluids, particularly if you work in health care.  If you plan to travel internationally, check with your local health department for specific vaccination recommendations.  Cancer Screening:  Breast cancer screening is essential to preventive care for women. All women age 2 and older should perform a breast self-exam every month. At age 60 and older, women should have their caregiver complete a breast exam each year. Women at ages 71 and older should have a mammogram (x-ray film) of the breasts. Your caregiver can discuss how often you need mammograms.    Cervical cancer screening includes taking a Pap smear (sample of cells examined under a microscope) from the cervix (end of the uterus). It also includes testing for HPV (Human Papilloma Virus, which can cause cervical cancer). Screening and a pelvic exam should begin at age 76, or 3 years after a  woman becomes sexually active. Screening should occur every year, with a Pap smear but no HPV testing, up to age 59. After age 51, you should have a Pap smear every 3 years with HPV testing, if no HPV was found previously.   Most routine colon cancer screening begins at the age of 11. On a yearly basis, doctors may provide special easy to use take-home tests to check for hidden blood in the stool. Sigmoidoscopy or colonoscopy can detect the earliest forms of colon cancer and is life saving. These tests use a small camera at the end of a tube to directly  examine the colon. Speak to your caregiver about this at age 51, when routine screening begins (and is repeated every 5 years unless early forms of pre-cancerous polyps or small growths are found).

## 2013-02-12 NOTE — Progress Notes (Signed)
Subjective:   HPI  Kayla Larsen is a 43 y.o. female who presents for a complete physical.  New patient today.    Preventative care: Last ophthalmology visit:yes Dr. Sabra Heck Last dental visit:yes Dr. Burt Knack Last colonoscopy: 2009 for anemia, Dr. Benson Norway Last mammogram:04/2012 Last gynecological exam:04/2012 Dr. Ruthann Cancer Last IRW:[id number] Last labs: last year  Prior vaccinations: TD or Tdap:? Influenza:09/2012 Pneumococcal:no Shingles/Zostavax:no  Concerns:   Reviewed their medical, surgical, family, social, medication, and allergy history and updated chart as appropriate.  Past Medical History  Diagnosis Date  . Hypertension   . Sleep apnea     borderline np cpap used  . History of blood transfusion 2009    2 units after hysterectomy  . Obesity   . Acanthosis nigricans   . Wears glasses   . Arthritis of knee     Past Surgical History  Procedure Laterality Date  . Abdominal hysterectomy  2009  . Cesarean section      x 2  . Laparotomy Left 07/30/2012    Procedure: LEFT SALPINGO OOPHERECTOMY,  OMENTECTOMY, AND LYSIS OF ADHESIONS;  Surgeon: Alvino Chapel, MD;  Location: [facility];  Service: Gynecology;  Laterality: Left;  . Removed a cyst on her ovaries  2014  . Oophorectomy      History   Social History  . Marital Status: Single    Spouse Name: N/A    Number of Children: N/A  . Years of Education: N/A   Occupational History  . Not on file.   Social History Main Topics  . Smoking status: Former Smoker -- 0.25 packs/day for 4 years    Types: Cigarettes    Quit date: 09/29/2011  . Smokeless tobacco: Never Used  . Alcohol Use: Yes     Comment: occassional  . Drug Use: No  . Sexual Activity: No   Other Topics Concern  . 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 No exercise    Family History  Problem Relation Age of Onset  . Cancer Mother     liver and lung  . Hypertension  Mother   . Diabetes Father   . Hypertension Brother   . Cancer Maternal Aunt     thyroid  . Heart disease Maternal Grandmother   . Stroke Neg Hx   . Hypertension Brother   . Hypertension Brother     Current outpatient prescriptions:amLODipine (NORVASC) 5 MG tablet, Take 5 mg by mouth daily., Disp: , Rfl: ;  cephALEXin (KEFLEX) 500 MG capsule, Take 500 mg by mouth once., Disp: , Rfl: ;  Multiple Vitamin (MULTIVITAMIN WITH MINERALS) TABS, Take 1 tablet by mouth daily., Disp: , Rfl: ;  vitamin C (ASCORBIC ACID) 500 MG tablet, Take 500 mg by mouth daily., Disp: , Rfl:  losartan (COZAAR) 25 MG tablet, Take 1 tablet (25 mg total) by mouth daily., Disp: 90 tablet, Rfl: 1  No Known Allergies    Review of Systems Constitutional: -fever, -chills, -sweats, -unexpected weight change, -decreased appetite, -fatigue Allergy: -sneezing, -itching, -congestion Dermatology: -changing moles, --rash, -lumps ENT: -runny nose, -ear pain, -sore throat, -hoarseness, -sinus pain, -teeth pain, - ringing in ears, -hearing loss, -nosebleeds Cardiology: -chest pain, -palpitations, -swelling, -difficulty breathing when lying flat, -waking up short of breath Respiratory: -cough, -shortness of breath, -difficulty breathing with exercise or exertion, -wheezing, -coughing up blood Gastroenterology: -abdominal pain, -nausea, -vomiting, -diarrhea, -constipation, -blood in stool, -changes in bowel  movement, -difficulty swallowing or eating Hematology: -bleeding, -bruising  Musculoskeletal: +joint aches, -muscle aches, +joint swelling, -back pain, -neck pain, +cramping, -changes in gait Ophthalmology: denies vision changes, eye redness, itching, discharge Urology: -burning with urination, -difficulty urinating, -blood in urine, -urinary frequency, -urgency, -incontinence Neurology: -headache, -weakness, -tingling, -numbness, -memory loss, -falls, -dizziness Psychology: -depressed mood, -agitation, -sleep problems      Objective:   Physical Exam  BP 130/88  Pulse 78  Temp(Src) 98.2 F (36.8 C) (Oral)  Resp 18  Ht 5\' 4"  (1.626 m)  Wt 240 lb (108.863 kg)  BMI 41.18 kg/m2  General appearance: alert, no distress, WD/WN, obese AA female Skin: darkened skin around neck perimeter with several skin tags present, suggestive of acanthosis nigricans, few scattered macules, no worrisome lesions HEENT: normocephalic, conjunctiva/corneas normal, sclerae anicteric, PERRLA, EOMi, nares patent, no discharge or erythema, pharynx normal Oral cavity: MMM, tongue normal, teeth in good repair, tongue ring present Neck: supple, no lymphadenopathy, no thyromegaly, no masses, normal ROM, no bruits Chest: non tender, normal shape and expansion Heart: RRR, normal S1, S2, no murmurs Lungs: CTA bilaterally, no wheezes, rhonchi, or rales Abdomen: +bs, soft, non tender, non distended, no masses, no hepatomegaly, no splenomegaly, no bruits Back: non tender, normal ROM, no scoliosis Musculoskeletal: upper extremities non tender, no obvious deformity, normal ROM throughout, lower extremities non tender, no obvious deformity, normal ROM throughout Extremities: no edema, no cyanosis, no clubbing Pulses: 2+ symmetric, upper and lower extremities, normal cap refill Neurological: alert, oriented x 3, CN2-12 intact, strength normal upper extremities and lower extremities, sensation normal throughout, DTRs 2+ throughout, no cerebellar signs, gait normal Psychiatric: normal affect, behavior normal, pleasant  Breast/gyn/rectal - deferred to gyencology   Assessment and Plan :    Encounter Diagnoses  Name Primary?  . Routine general medical examination at a health care facility Yes  . Need for Tdap vaccination   . Obesity, unspecified   . HTN (hypertension)   . Edema     Physical exam - discussed healthy lifestyle, diet, exercise, preventative care, vaccinations, and addressed their concerns.  Handout given. see gynecologist, eye  doctor, dentist yearly. Counseled on the Tdap (tetanus, diptheria, and acellular pertussis) vaccine.  Vaccine information sheet given. Tdap vaccine given after consent obtained. Obesity - advised 1800-calorie per day diet, exercise regularly, discussed types of exercise she can do, use My Fitness Pal smart phone app, recheck in 2-3 months. HTN - given her work schedule, she is having a hard time using hydrochlorothiazide due to urination.  Continue amlodipine, begin losartan 25 mg daily. Followup:56mo Edema - none present today Follow-up pending labs

## 2013-02-13 ENCOUNTER — Other Ambulatory Visit: Payer: Self-pay | Admitting: Medical

## 2013-02-13 LAB — TSH: TSH: 2.422 u[IU]/mL (ref 0.350–4.500)

## 2013-02-13 MED ORDER — FERROUS GLUCONATE 324 (38 FE) MG PO TABS: 324.0000 mg | ORAL_TABLET | Freq: Two times a day (BID) | ORAL | Status: DC

## 2013-05-13 ENCOUNTER — Other Ambulatory Visit (HOSPITAL_COMMUNITY): Payer: Self-pay | Admitting: Obstetrics

## 2013-05-13 DIAGNOSIS — Z1231 Encounter for screening mammogram for malignant neoplasm of breast: Secondary | ICD-10-CM

## 2013-05-15 ENCOUNTER — Ambulatory Visit (HOSPITAL_COMMUNITY): Payer: BC Managed Care – PPO

## 2013-05-15 ENCOUNTER — Ambulatory Visit (HOSPITAL_COMMUNITY): Payer: No Typology Code available for payment source

## 2013-05-16 ENCOUNTER — Ambulatory Visit (HOSPITAL_COMMUNITY)
Admission: RE | Admit: 2013-05-16 | Discharge: 2013-05-16 | Disposition: A | Discharge status: Home / Self Care | Payer: BC Managed Care – PPO | Source: Ambulatory Visit | Attending: Obstetrics | Admitting: Obstetrics

## 2013-05-16 DIAGNOSIS — Z1231 Encounter for screening mammogram for malignant neoplasm of breast: Secondary | ICD-10-CM | POA: Insufficient documentation

## 2013-09-10 ENCOUNTER — Ambulatory Visit (INDEPENDENT_AMBULATORY_CARE_PROVIDER_SITE_OTHER): Payer: BC Managed Care – PPO | Admitting: Medical

## 2013-09-10 ENCOUNTER — Encounter: Payer: Self-pay | Admitting: Medical

## 2013-09-10 VITALS — BP 132/82 | HR 72 | Temp 98.0°F | Resp 16 | Wt 243.0 lb

## 2013-09-10 DIAGNOSIS — R358 Other polyuria: Secondary | ICD-10-CM

## 2013-09-10 DIAGNOSIS — I10 Essential (primary) hypertension: Secondary | ICD-10-CM

## 2013-09-10 DIAGNOSIS — R3589 Other polyuria: Secondary | ICD-10-CM

## 2013-09-10 DIAGNOSIS — Z23 Encounter for immunization: Secondary | ICD-10-CM

## 2013-09-10 DIAGNOSIS — R7301 Impaired fasting glucose: Secondary | ICD-10-CM

## 2013-09-10 DIAGNOSIS — R300 Dysuria: Secondary | ICD-10-CM

## 2013-09-10 LAB — CBC WITH DIFFERENTIAL/PLATELET
BASOS ABS: 0 10*3/uL (ref 0.0–0.1)
BASOS PCT: 0 % (ref 0–1)
EOS ABS: 0.2 10*3/uL (ref 0.0–0.7)
EOS PCT: 3 % (ref 0–5)
HCT: 36.3 % (ref 36.0–46.0)
Hemoglobin: 11.1 g/dL — ABNORMAL LOW (ref 12.0–15.0)
Lymphocytes Relative: 61 % — ABNORMAL HIGH (ref 12–46)
Lymphs Abs: 3.4 10*3/uL (ref 0.7–4.0)
MCH: 22.4 pg — AB (ref 26.0–34.0)
MCHC: 30.6 g/dL (ref 30.0–36.0)
MCV: 73.2 fL — AB (ref 78.0–100.0)
Monocytes Absolute: 0.4 10*3/uL (ref 0.1–1.0)
Monocytes Relative: 8 % (ref 3–12)
NEUTROS PCT: 28 % — AB (ref 43–77)
Neutro Abs: 1.6 10*3/uL — ABNORMAL LOW (ref 1.7–7.7)
PLATELETS: 292 10*3/uL (ref 150–400)
RBC: 4.96 MIL/uL (ref 3.87–5.11)
RDW: 16.5 % — AB (ref 11.5–15.5)
WBC: 5.6 10*3/uL (ref 4.0–10.5)

## 2013-09-10 LAB — POCT URINALYSIS DIPSTICK
Bilirubin, UA: NEGATIVE
Glucose, UA: NEGATIVE
KETONES UA: NEGATIVE
LEUKOCYTES UA: NEGATIVE
Nitrite, UA: NEGATIVE
PH UA: 5
RBC UA: NEGATIVE
Spec Grav, UA: 1.02
Urobilinogen, UA: NEGATIVE

## 2013-09-10 LAB — POCT GLYCOSYLATED HEMOGLOBIN (HGB A1C): Hemoglobin A1C: 5.9

## 2013-09-10 MED ORDER — AMLODIPINE BESYLATE 5 MG PO TABS: 5.0000 mg | ORAL_TABLET | Freq: Every day | ORAL | Status: DC

## 2013-09-10 MED ORDER — TOLTERODINE TARTRATE ER 4 MG PO CP24: 4.0000 mg | ORAL_CAPSULE | Freq: Every day | ORAL | Status: DC

## 2013-09-10 NOTE — Progress Notes (Signed)
Subjective: Here for urinary frequency.   She notes problems with this for 3 months.  Urinates 15 x daily.   No incontinence.  Feels like she could have incontinence if not making it the bathroom in time.   No burning with urination, no blood in urine, no odor in urine, no cloudiness in urine.   Thinks it started with her hysterectomy in 2009, but has worsened. Just had ovarian cyst removed.  Hysterectomy was for heavy bleeding and fibroids.  Hysterectomy per Dr. Gracy Racer, stills sees him for gynecology.  No abdominal pain, sometimes gets back spasm.   No weight loss, no fever.  Hasn't used any medication for this.   No hx/o glaucoma.  Denies polydipsia, no recent weight change. In general vision can be blurry at times.  She stopped taking HCTZ due to increased urination.  No other aggravating or relieving factors.    HTN - only taking amlodipine 5mg  daily.  Stopped HCTZ due to urination increase, stopped Losartan due to fears of side effects after reading label.  Impaired glucose - has cut out sweets, eating healthy, but not losing any weight.   ROS as in subjective  Past Medical History  Diagnosis Date  . Hypertension   . Sleep apnea     borderline np cpap used  . History of blood transfusion 2009    2 units after hysterectomy  . Obesity   . Acanthosis nigricans   . Wears glasses   . Arthritis of knee     Objective: Filed Vitals:   09/10/13 0824  BP: 132/82  Pulse: 72  Temp: 98 F (36.7 C)  Resp: 16   Wt Readings from Last 3 Encounters:  09/10/13 243 lb (110.224 kg)  02/12/13 240 lb (108.863 kg)  09/06/12 243 lb 1.6 oz (110.269 kg)    General appearance: alert, no distress, WD/WN Neck: supple, no lymphadenopathy, no thyromegaly, no masses Heart: RRR, normal S1, S2, no murmurs Lungs: CTA bilaterally, no wheezes, rhonchi, or rales Abdomen: +bs, soft, non tender, non distended, no masses, no hepatomegaly, no splenomegaly Pulses: 2+ symmetric, upper and lower  extremities, normal cap refill Back: nontender   Assessment: Encounter Diagnoses  Name Primary?  . Polyuria Yes  . Dysuria   . Impaired fasting blood sugar   . Essential hypertension, benign   . Need for prophylactic vaccination and inoculation against influenza    Plan: Polyuria - likely due to overactive bladder. Begin trial of Detrol LA 4mg  daily, discussed risks/benefits of medications  Impaired fasting glucose - HgbA1C of 5.9% today.  C/t efforts at weight loss through healthy diet and exercise  HTN - c/t Amlodipine 5mg  daily, c/t efforts at weight loss.  She declines any medication changes today.    Counseled on the influenza virus vaccine.  Vaccine information sheet given.  Influenza vaccine given after consent obtained.

## 2013-09-11 LAB — IRON AND TIBC
%SAT: 22 % (ref 20–55)
IRON: 71 ug/dL (ref 42–145)
TIBC: 327 ug/dL (ref 250–470)
UIBC: 256 ug/dL (ref 125–400)

## 2013-09-11 LAB — BASIC METABOLIC PANEL
BUN: 12 mg/dL (ref 6–23)
CHLORIDE: 103 meq/L (ref 96–112)
CO2: 26 meq/L (ref 19–32)
CREATININE: 0.7 mg/dL (ref 0.50–1.10)
Calcium: 9.9 mg/dL (ref 8.4–10.5)
GLUCOSE: 129 mg/dL — AB (ref 70–99)
Potassium: 3.6 mEq/L (ref 3.5–5.3)
Sodium: 139 mEq/L (ref 135–145)

## 2013-09-11 NOTE — Progress Notes (Signed)
LM to [facility] 

## 2013-11-03 ENCOUNTER — Other Ambulatory Visit: Payer: Self-pay | Admitting: Medical

## 2014-01-04 ENCOUNTER — Other Ambulatory Visit: Payer: Self-pay | Admitting: Medical

## 2014-01-04 ENCOUNTER — Other Ambulatory Visit: Payer: Self-pay | Admitting: Family Medicine

## 2014-01-05 ENCOUNTER — Other Ambulatory Visit: Payer: Self-pay | Admitting: Family Medicine

## 2014-01-05 MED ORDER — FERROUS GLUCONATE 324 (38 FE) MG PO TABS: 324.0000 mg | ORAL_TABLET | Freq: Every day | ORAL | Status: DC

## 2014-02-03 ENCOUNTER — Other Ambulatory Visit: Payer: Self-pay | Admitting: Medical

## 2014-03-02 ENCOUNTER — Other Ambulatory Visit: Payer: Self-pay | Admitting: Medical

## 2014-03-03 NOTE — Telephone Encounter (Signed)
PATIENT NEEDS AN APPOINTMENT ASAP IN ORDER TO RECEIVE ANYMORE REFILLS

## 2014-03-05 ENCOUNTER — Telehealth: Payer: Self-pay | Admitting: Medical

## 2014-03-06 MED ORDER — FERROUS GLUCONATE 324 (38 FE) MG PO TABS: 324.0000 mg | ORAL_TABLET | Freq: Every day | ORAL | Status: DC

## 2014-03-06 MED ORDER — AMLODIPINE BESYLATE 5 MG PO TABS: 5.0000 mg | ORAL_TABLET | Freq: Every day | ORAL | Status: DC

## 2014-03-06 NOTE — Telephone Encounter (Signed)
pls make her a f/u appt

## 2014-03-26 ENCOUNTER — Encounter: Payer: Self-pay | Admitting: Medical

## 2014-03-26 ENCOUNTER — Ambulatory Visit (INDEPENDENT_AMBULATORY_CARE_PROVIDER_SITE_OTHER): Payer: BLUE CROSS/BLUE SHIELD | Admitting: Medical

## 2014-03-26 ENCOUNTER — Telehealth: Payer: Self-pay | Admitting: Medical

## 2014-03-26 VITALS — BP 130/80 | HR 72 | Temp 98.1°F | Resp 15 | Wt 235.0 lb

## 2014-03-26 DIAGNOSIS — L732 Hidradenitis suppurativa: Secondary | ICD-10-CM | POA: Diagnosis not present

## 2014-03-26 DIAGNOSIS — L723 Sebaceous cyst: Secondary | ICD-10-CM | POA: Diagnosis not present

## 2014-03-26 MED ORDER — CEPHALEXIN 500 MG PO CAPS: 500.0000 mg | ORAL_CAPSULE | Freq: Every day | ORAL | Status: DC

## 2014-03-26 MED ORDER — HIBICLENS HAND PUMP 32OZ MISC: 1.0000 "application " | Status: DC

## 2014-03-26 NOTE — Progress Notes (Signed)
  Subjective:   Kayla Larsen is a 44 y.o. female who presents for evaluation of a boils under her arms. This has been a long-term chronic problem. For the past year she has taken Keflex daily due to frequent recurrence of the problem, has been on amoxicillin for the same reason in the past. In addition to lesions under both arms currently she also has a bump in between her breasts and a similar boil-like lesion under her left breast.. Only has had I and D one time in the past. No history of MRSA. Patient denies hx/o poor wound healing, compromised immunity or HIV.  No other aggravating or relieving factors.  No other c/o.  Past Medical History  Diagnosis Date  . Hypertension   . Sleep apnea     borderline np cpap used  . History of blood transfusion 2009    2 units after hysterectomy  . Obesity   . Acanthosis nigricans   . Wears glasses   . Arthritis of knee     Reviewed prior allergies, medications, past medical history, past surgical history.  ROS as in subjective   Objective:   Filed Vitals:   03/26/14 1507  BP: 130/80  Pulse: 72  Temp: 98.1 F (36.7 C)  Resp: 15    Gen: wd, wn, nad Skin: There is a 2 cm area of the medial edge of the right breast suggestive of sebaceous cyst with pore opening, bilateral tender mildly fluctuant nodules of both axilla consistent with hidradenitis axillaris, and on fluctuant 5 mm diameter similar lesion under right breast although none draining, no induration, no warmth   Assessment:   Encounter Diagnoses  Name Primary?  . Hidradenitis axillaris Yes  . Sebaceous cyst      Plan:   Discussed examination findings, diagnosis, treatment options.  Discussed hygiene, Hibiclens body wash weekly, and use drying powders under breasts and underarms, and avoid sugary foods, try and lose some weight which may also help. Begin back on Keflex daily for treatment and suppression, referral to general surgery for consult for the same as well as  consult for the sebaceous cyst. Return soon as planned for physical

## 2014-03-26 NOTE — Telephone Encounter (Signed)
Andria Frames - verify with patient first that she wants to pursue this.  Refer to general surgery for sebaceous cyst of chest/medial breast wall as well as possible consult for hidradenitis axillaris

## 2014-03-26 NOTE — Addendum Note (Signed)
Addended by: Carlena Hurl on: 03/26/2014 08:53 PM   Modules accepted: Orders

## 2014-03-27 NOTE — Telephone Encounter (Signed)
Patient states that she will wait until she comes in for her physical with you. She states that if it hasn't left by then, then she will see general surgeon.

## 2014-04-12 ENCOUNTER — Other Ambulatory Visit: Payer: Self-pay | Admitting: Medical

## 2014-04-13 ENCOUNTER — Encounter: Payer: Self-pay | Admitting: Medical

## 2014-04-28 ENCOUNTER — Other Ambulatory Visit: Payer: Self-pay | Admitting: Medical

## 2014-04-29 ENCOUNTER — Encounter: Payer: Self-pay | Admitting: Medical

## 2014-04-29 ENCOUNTER — Ambulatory Visit (INDEPENDENT_AMBULATORY_CARE_PROVIDER_SITE_OTHER): Payer: BLUE CROSS/BLUE SHIELD | Admitting: Medical

## 2014-04-29 VITALS — BP 124/78 | HR 55 | Temp 97.7°F | Ht 64.0 in | Wt 239.0 lb

## 2014-04-29 DIAGNOSIS — Z0000 Encounter for general adult medical examination without abnormal findings: Secondary | ICD-10-CM | POA: Diagnosis not present

## 2014-04-29 DIAGNOSIS — N3281 Overactive bladder: Secondary | ICD-10-CM | POA: Diagnosis not present

## 2014-04-29 DIAGNOSIS — I10 Essential (primary) hypertension: Secondary | ICD-10-CM

## 2014-04-29 DIAGNOSIS — E669 Obesity, unspecified: Secondary | ICD-10-CM | POA: Diagnosis not present

## 2014-04-29 LAB — COMPREHENSIVE METABOLIC PANEL
ALT: 19 U/L (ref 0–35)
AST: 13 U/L (ref 0–37)
Albumin: 4.2 g/dL (ref 3.5–5.2)
Alkaline Phosphatase: 42 U/L (ref 39–117)
BUN: 13 mg/dL (ref 6–23)
CALCIUM: 9.5 mg/dL (ref 8.4–10.5)
CHLORIDE: 105 meq/L (ref 96–112)
CO2: 24 mEq/L (ref 19–32)
Creat: 0.62 mg/dL (ref 0.50–1.10)
GLUCOSE: 94 mg/dL (ref 70–99)
Potassium: 4 mEq/L (ref 3.5–5.3)
Sodium: 141 mEq/L (ref 135–145)
Total Bilirubin: 0.4 mg/dL (ref 0.2–1.2)
Total Protein: 7.1 g/dL (ref 6.0–8.3)

## 2014-04-29 LAB — POCT URINALYSIS DIPSTICK
Bilirubin, UA: NEGATIVE
Blood, UA: NEGATIVE
Glucose, UA: NEGATIVE
KETONES UA: NEGATIVE
LEUKOCYTES UA: NEGATIVE
Nitrite, UA: NEGATIVE
Protein, UA: NEGATIVE
Spec Grav, UA: >=1.03
UROBILINOGEN UA: NEGATIVE
pH, UA: 6

## 2014-04-29 LAB — CBC
HCT: 36.3 % (ref 36.0–46.0)
Hemoglobin: 11.4 g/dL — ABNORMAL LOW (ref 12.0–15.0)
MCH: 22.9 pg — AB (ref 26.0–34.0)
MCHC: 31.4 g/dL (ref 30.0–36.0)
MCV: 72.9 fL — AB (ref 78.0–100.0)
MPV: 9.4 fL (ref 8.6–12.4)
PLATELETS: 307 10*3/uL (ref 150–400)
RBC: 4.98 MIL/uL (ref 3.87–5.11)
RDW: 16.5 % — ABNORMAL HIGH (ref 11.5–15.5)
WBC: 6.1 10*3/uL (ref 4.0–10.5)

## 2014-04-29 LAB — LIPID PANEL
Cholesterol: 168 mg/dL (ref 0–200)
HDL: 52 mg/dL (ref >=46)
LDL CALC: 67 mg/dL (ref 0–99)
Total CHOL/HDL Ratio: 3.2 Ratio
Triglycerides: 247 mg/dL — ABNORMAL HIGH (ref <150)
VLDL: 49 mg/dL — ABNORMAL HIGH (ref 0–40)

## 2014-04-29 MED ORDER — TOLTERODINE TARTRATE ER 4 MG PO CP24: 4.0000 mg | ORAL_CAPSULE | Freq: Every day | ORAL | Status: DC

## 2014-04-29 MED ORDER — FERROUS GLUCONATE 324 (38 FE) MG PO TABS: 324.0000 mg | ORAL_TABLET | Freq: Every day | ORAL | Status: DC

## 2014-04-29 MED ORDER — AMLODIPINE BESYLATE 5 MG PO TABS: 5.0000 mg | ORAL_TABLET | Freq: Every day | ORAL | Status: DC

## 2014-04-29 NOTE — Progress Notes (Signed)
Subjective:   HPI  Kayla Larsen is a 44 y.o. female who presents for a complete physical.  Medical care team includes:  Dr. Gracy Racer for gynecology  Dr. Ninfa Linden, orthopedics regarding right knee  Dr. Burt Knack, dentist  Dr. Gershon Crane, ophthalmology [name], PA-C here for primary care   Preventative care: Last ophthalmology visit: January 2016 Last dental visit: less than 6 months Last colonoscopy: 2009 Last mammogram: 2015 Last gynecological exam: 3 weeks ago Last EKG: last year Last labs: last years  Prior vaccinations: TD or Tdap: 2015 Influenza: 2015 Pneumococcal: never  Advanced directive: Health care power of attorney: no Living will: no  Concerns: None in particular  Just saw ortho recently, had cortisone shot in right knee  Reviewed their medical, surgical, family, social, medication, and allergy history and updated chart as appropriate.  Past Medical History  Diagnosis Date  . Hypertension   . History of blood transfusion 2009    2 units after hysterectomy  . Obesity   . Acanthosis nigricans   . Wears glasses   . Arthritis of knee   . Sleep apnea 2010    borderline no cpap used  . Anemia   . External hemorrhoid     Past Surgical History  Procedure Laterality Date  . Cesarean section      x 2  . Laparotomy Left 07/30/2012    Procedure: LEFT SALPINGO OOPHERECTOMY,  OMENTECTOMY, AND LYSIS OF ADHESIONS;  Surgeon: Alvino Chapel, MD;  Location: [facility];  Service: Gynecology;  Laterality: Left;  . Removed a cyst on her ovaries  2014  . Oophorectomy  2014    left   . Abdominal hysterectomy  2009  . Colonoscopy  2009    done for anemia eval; Dr. Benson Norway    History   Social History  . Marital Status: Single    Spouse Name: N/A  . Number of Children: N/A  . Years of Education: N/A   Occupational History  . Not on file.   Social History Main Topics  . Smoking status: Former Smoker -- 0.25 packs/day for 4 years    Types:  Cigarettes    Quit date: 09/29/2011  . Smokeless tobacco: Never Used  . Alcohol Use: 1.2 oz/week    2 Shots of liquor per week     Comment: occassional  . Drug Use: No  . Sexual Activity: No   Other Topics Concern  . 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  Goes to the gym some    Family History  Problem Relation Age of Onset  . Cancer Mother     liver and lung  . Hypertension Mother   . Diabetes Father   . Hypertension Brother   . Cancer Maternal Aunt     thyroid  . Diabetes Maternal Aunt   . Hypertension Maternal Aunt   . Heart disease Maternal Grandmother   . Stroke Neg Hx   . Hypertension Brother   . Hypertension Brother   . Diabetes Maternal Uncle   . Hypertension Maternal Uncle      Current outpatient prescriptions:  .  amLODipine (NORVASC) 5 MG tablet, Take 1 tablet (5 mg total) by mouth daily., Disp: 30 tablet, Rfl: 1 .  diclofenac sodium (VOLTAREN) 1 % GEL, Apply topically 4 (four) times daily., Disp: , Rfl:  .  ferrous gluconate (FERGON) 324 MG tablet, Take 1 tablet (324  mg total) by mouth daily with breakfast., Disp: 60 tablet, Rfl: 0 .  meloxicam (MOBIC) 7.5 MG tablet, Take 7.5 mg by mouth daily., Disp: , Rfl:  .  Misc. Devices (HIBICLENS HAND PUMP 30QM) MISC, 1 application by Does not apply route once a week., Disp: 1 each, Rfl: 2 .  Multiple Vitamin (MULTIVITAMIN WITH MINERALS) TABS, Take 1 tablet by mouth daily., Disp: , Rfl:  .  tolterodine (DETROL LA) 4 MG 24 hr capsule, Take 1 capsule (4 mg total) by mouth daily., Disp: 30 capsule, Rfl: 2 .  cephALEXin (KEFLEX) 500 MG capsule, Take 1 capsule (500 mg total) by mouth daily. (Patient not taking: Reported on 04/29/2014), Disp: 30 capsule, Rfl: 2  No Known Allergies     Review of Systems Constitutional: -fever, -chills, -sweats, -unexpected weight change, -decreased appetite, -fatigue Allergy: -sneezing, -itching,  -congestion Dermatology: -changing moles, --rash, -lumps ENT: -runny nose, -ear pain, -sore throat, -hoarseness, -sinus pain, -teeth pain, - ringing in ears, -hearing loss, -nosebleeds Cardiology: -chest pain, -palpitations, -swelling, -difficulty breathing when lying flat, -waking up short of breath Respiratory: -cough, -shortness of breath, -difficulty breathing with exercise or exertion, -wheezing, -coughing up blood Gastroenterology: -abdominal pain, -nausea, -vomiting, -diarrhea, -constipation, -blood in stool, -changes in bowel movement, -difficulty swallowing or eating Hematology: -bleeding, -bruising  Musculoskeletal: -joint aches, -muscle aches, -joint swelling, -back pain, -neck pain, -cramping, -changes in gait +knee pain (had shots last week) Ophthalmology: denies vision changes, eye redness, itching, discharge Urology: -burning with urination, -difficulty urinating, -blood in urine, -urinary frequency, -urgency, -incontinence Neurology: -headache, -weakness, -tingling, -numbness, -memory loss, -falls, -dizziness Psychology: -depressed mood, -agitation, -sleep problems     Objective:   Physical Exam  BP 124/78 mmHg  Pulse 55  Temp(Src) 97.7 F (36.5 C) (Oral)  Ht 5\' 4"  (1.626 m)  Wt 239 lb (108.41 kg)  BMI 41.00 kg/m2  General appearance: alert, no distress, WD/WN, obese AA female Skin: darkened skin around neck perimeter with several skin tags present, suggestive of acanthosis nigricans, few scattered macules, no worrisome lesions, tattoos right forearm, left dorsal wrist, right lateral lower leg, left lower lateral leg, burns scar of left volar forearm HEENT: normocephalic, conjunctiva/corneas normal, sclerae anicteric, PERRLA, EOMi, nares patent, no discharge or erythema, pharynx normal Oral cavity: MMM, tongue normal, teeth in good repair, tongue ring present Neck: supple, no lymphadenopathy, no thyromegaly, no masses, normal ROM, no bruits Chest: non tender, normal  shape and expansion Heart: RRR, normal S1, S2, no murmurs Lungs: CTA bilaterally, no wheezes, rhonchi, or rales Abdomen: +bs, soft, non tender, non distended, no masses, no hepatomegaly, no splenomegaly, no bruits Back: non tender, normal ROM, no scoliosis Musculoskeletal: upper extremities non tender, no obvious deformity, normal ROM throughout, lower extremities non tender, no obvious deformity, normal ROM throughout Extremities: no edema, no cyanosis, no clubbing Pulses: 2+ symmetric, upper and lower extremities, normal cap refill Neurological: alert, oriented x 3, CN2-12 intact, strength normal upper extremities and lower extremities, sensation normal throughout, DTRs 2+ throughout, no cerebellar signs, gait normal Psychiatric: normal affect, behavior normal, pleasant  Breast/gyn/rectal - deferred to gynecology   Adult ECG Report  Indication: HTN, physical, obese  Rate: 61bpm  Rhythm: normal sinus rhythm  QRS Axis: 18 degrees  PR Interval: 175ms  QRS Duration: 66ms  QTc: 476ms  Conduction Disturbances: none  Other Abnormalities: none  Patient's cardiac risk factors are: hypertension, obesity (BMI >= 30 kg/m2) and sedentary lifestyle.  EKG comparison: none  Narrative Interpretation: normal EKG   Assessment  and Plan :    Encounter Diagnoses  Name Primary?  . Routine general medical examination at a health care facility Yes  . Essential hypertension   . Obesity   . Overactive bladder     Physical exam - discussed healthy lifestyle, diet, exercise, preventative care, vaccinations, and addressed their concerns.  Handout given. Routine labs today See your eye doctor yearly for routine vision care. See your dentist yearly for routine dental care including hygiene visits twice yearly. See your gynecologist yearly for routine gynecological care. Up to date on vaccines Obesity - c/t efforts at weight loss with healthy lifestyle changes as discussed Overactive bladder - doing  fine on current medication HTN - c/t current medications Follow-up pending labs

## 2014-04-30 LAB — HEMOGLOBIN A1C
Hgb A1c MFr Bld: 6.1 % — ABNORMAL HIGH (ref <5.7)
Mean Plasma Glucose: 128 mg/dL — ABNORMAL HIGH (ref <117)

## 2014-04-30 LAB — MICROALBUMIN / CREATININE URINE RATIO
Creatinine, Urine: 134.2 mg/dL
Microalb Creat Ratio: 5.2 mg/g (ref 0.0–30.0)
Microalb, Ur: 0.7 mg/dL (ref <2.0)

## 2014-05-01 ENCOUNTER — Telehealth: Payer: Self-pay | Admitting: Medical

## 2014-05-01 NOTE — Telephone Encounter (Signed)
Patient states she contacted insurance about diet pill and they told her to just have you send in Rx to [place]

## 2014-05-04 ENCOUNTER — Other Ambulatory Visit: Payer: Self-pay | Admitting: Medical

## 2014-05-04 ENCOUNTER — Other Ambulatory Visit: Payer: Self-pay | Admitting: Family Medicine

## 2014-05-04 ENCOUNTER — Other Ambulatory Visit (HOSPITAL_COMMUNITY): Payer: Self-pay | Admitting: Obstetrics

## 2014-05-04 DIAGNOSIS — Z1231 Encounter for screening mammogram for malignant neoplasm of breast: Secondary | ICD-10-CM

## 2014-05-04 MED ORDER — HYDROCHLOROTHIAZIDE 12.5 MG PO TABS: 12.5000 mg | ORAL_TABLET | Freq: Every day | ORAL | Status: DC

## 2014-05-04 MED ORDER — PHENTERMINE-TOPIRAMATE ER 7.5-46 MG PO CP24: 1.0000 | ORAL_CAPSULE | ORAL | Status: DC

## 2014-05-04 MED ORDER — PHENTERMINE-TOPIRAMATE ER 3.75-23 MG PO CP24: 1.0000 | ORAL_CAPSULE | ORAL | Status: DC

## 2014-05-04 NOTE — Telephone Encounter (Signed)
pls call in starter and regular dose of Qsymia to be taken once daily, c/t efforts with healthy diet and exercise, and f/u in 1 month

## 2014-05-05 NOTE — Telephone Encounter (Signed)
i CALLED OUT BOTH DOSE'S OF THE QYSMIA TO HER  PHARMACY

## 2014-05-16 ENCOUNTER — Telehealth: Payer: Self-pay | Admitting: Medical

## 2014-05-18 ENCOUNTER — Ambulatory Visit (HOSPITAL_COMMUNITY)
Admission: RE | Admit: 2014-05-18 | Discharge: 2014-05-18 | Disposition: A | Discharge status: Home / Self Care | Payer: BLUE CROSS/BLUE SHIELD | Source: Ambulatory Visit | Attending: Obstetrics | Admitting: Obstetrics

## 2014-05-18 DIAGNOSIS — Z1231 Encounter for screening mammogram for malignant neoplasm of breast: Secondary | ICD-10-CM | POA: Diagnosis not present

## 2014-05-19 NOTE — Telephone Encounter (Signed)
P.A. Approved Qsymia til 11/11/14, faxed pharmacy, Left message for pt

## 2014-09-30 ENCOUNTER — Other Ambulatory Visit (INDEPENDENT_AMBULATORY_CARE_PROVIDER_SITE_OTHER): Payer: BLUE CROSS/BLUE SHIELD

## 2014-09-30 DIAGNOSIS — Z23 Encounter for immunization: Secondary | ICD-10-CM | POA: Diagnosis not present

## 2014-10-01 ENCOUNTER — Other Ambulatory Visit: Payer: Self-pay | Admitting: Medical

## 2014-10-01 NOTE — Telephone Encounter (Signed)
IS THIS OKAY TO REFILL 

## 2014-12-23 ENCOUNTER — Other Ambulatory Visit: Payer: Self-pay | Admitting: Medical

## 2014-12-23 MED ORDER — FERROUS GLUCONATE 324 (38 FE) MG PO TABS: 324.0000 mg | ORAL_TABLET | Freq: Every day | ORAL | Status: DC

## 2015-02-25 ENCOUNTER — Other Ambulatory Visit: Payer: Self-pay | Admitting: Medical

## 2015-03-24 ENCOUNTER — Other Ambulatory Visit: Payer: Self-pay | Admitting: Medical

## 2015-03-24 NOTE — Telephone Encounter (Signed)
Is this ok to refill?  

## 2015-04-02 ENCOUNTER — Telehealth: Payer: Self-pay | Admitting: Medical

## 2015-04-02 MED ORDER — TOLTERODINE TARTRATE ER 4 MG PO CP24: 4.0000 mg | ORAL_CAPSULE | Freq: Every day | ORAL | Status: DC

## 2015-04-02 NOTE — Telephone Encounter (Signed)
Refilled until appt.

## 2015-04-02 NOTE — Telephone Encounter (Signed)
Pt called stating that pharmacy informed her that we denied refill on Tolterodine 4mg  stating that she need an appt but pt already sched foe CPE in April so she wants to know if med can be refilled

## 2015-04-28 ENCOUNTER — Other Ambulatory Visit: Payer: Self-pay | Admitting: Medical

## 2015-05-03 ENCOUNTER — Encounter: Payer: Self-pay | Admitting: *Deleted

## 2015-05-03 ENCOUNTER — Encounter: Payer: Self-pay | Admitting: Medical

## 2015-05-03 ENCOUNTER — Ambulatory Visit (INDEPENDENT_AMBULATORY_CARE_PROVIDER_SITE_OTHER): Payer: BLUE CROSS/BLUE SHIELD | Admitting: Medical

## 2015-05-03 VITALS — BP 146/94 | HR 72 | Ht 64.0 in | Wt 256.0 lb

## 2015-05-03 DIAGNOSIS — I10 Essential (primary) hypertension: Secondary | ICD-10-CM

## 2015-05-03 DIAGNOSIS — Z862 Personal history of diseases of the blood and blood-forming organs and certain disorders involving the immune mechanism: Secondary | ICD-10-CM | POA: Diagnosis not present

## 2015-05-03 DIAGNOSIS — E781 Pure hyperglyceridemia: Secondary | ICD-10-CM | POA: Insufficient documentation

## 2015-05-03 DIAGNOSIS — Z0000 Encounter for general adult medical examination without abnormal findings: Secondary | ICD-10-CM | POA: Insufficient documentation

## 2015-05-03 DIAGNOSIS — L83 Acanthosis nigricans: Secondary | ICD-10-CM | POA: Diagnosis not present

## 2015-05-03 DIAGNOSIS — N3281 Overactive bladder: Secondary | ICD-10-CM | POA: Diagnosis not present

## 2015-05-03 DIAGNOSIS — E669 Obesity, unspecified: Secondary | ICD-10-CM | POA: Insufficient documentation

## 2015-05-03 DIAGNOSIS — E049 Nontoxic goiter, unspecified: Secondary | ICD-10-CM

## 2015-05-03 DIAGNOSIS — M1990 Unspecified osteoarthritis, unspecified site: Secondary | ICD-10-CM

## 2015-05-03 DIAGNOSIS — R7301 Impaired fasting glucose: Secondary | ICD-10-CM | POA: Diagnosis not present

## 2015-05-03 LAB — POCT URINALYSIS DIPSTICK
BILIRUBIN UA: NEGATIVE
Blood, UA: NEGATIVE
Glucose, UA: NEGATIVE
Ketones, UA: NEGATIVE
LEUKOCYTES UA: NEGATIVE
Nitrite, UA: NEGATIVE
PH UA: 6
Spec Grav, UA: >=1.03
Urobilinogen, UA: NEGATIVE

## 2015-05-03 MED ORDER — AMLODIPINE BESYLATE 5 MG PO TABS: 5.0000 mg | ORAL_TABLET | Freq: Every day | ORAL | Status: DC

## 2015-05-03 MED ORDER — LOSARTAN POTASSIUM 25 MG PO TABS: 25.0000 mg | ORAL_TABLET | Freq: Every day | ORAL | Status: DC

## 2015-05-03 MED ORDER — TOLTERODINE TARTRATE ER 4 MG PO CP24: 4.0000 mg | ORAL_CAPSULE | Freq: Every day | ORAL | Status: DC
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
## 2015-05-03 NOTE — Addendum Note (Signed)
Addended by: Billie Lade on: 05/03/2015 09:14 AM   Modules accepted: Orders, SmartSet

## 2015-05-04 LAB — CBC WITH DIFFERENTIAL/PLATELET
BASOS ABS: 0 {cells}/uL (ref 0–200)
Basophils Relative: 0 %
EOS PCT: 3 %
Eosinophils Absolute: 183 cells/uL (ref 15–500)
HEMATOCRIT: 36.6 % (ref 35.0–45.0)
HEMOGLOBIN: 11.1 g/dL — AB (ref 11.7–15.5)
LYMPHS ABS: 2989 {cells}/uL (ref 850–3900)
Lymphocytes Relative: 49 %
MCH: 22.5 pg — AB (ref 27.0–33.0)
MCHC: 30.3 g/dL — AB (ref 32.0–36.0)
MCV: 74.1 fL — ABNORMAL LOW (ref 80.0–100.0)
MPV: 9.1 fL (ref 7.5–12.5)
Monocytes Absolute: 488 cells/uL (ref 200–950)
Monocytes Relative: 8 %
NEUTROS PCT: 40 %
Neutro Abs: 2440 cells/uL (ref 1500–7800)
Platelets: 311 10*3/uL (ref 140–400)
RBC: 4.94 MIL/uL (ref 3.80–5.10)
RDW: 16.7 % — ABNORMAL HIGH (ref 11.0–15.0)
WBC: 6.1 10*3/uL (ref 4.0–10.5)

## 2015-05-04 LAB — COMPREHENSIVE METABOLIC PANEL
ALBUMIN: 4.5 g/dL (ref 3.6–5.1)
ALT: 40 U/L — ABNORMAL HIGH (ref 6–29)
AST: 21 U/L (ref 10–35)
Alkaline Phosphatase: 48 U/L (ref 33–115)
BILIRUBIN TOTAL: 0.4 mg/dL (ref 0.2–1.2)
BUN: 15 mg/dL (ref 7–25)
CALCIUM: 9.7 mg/dL (ref 8.6–10.2)
CHLORIDE: 105 mmol/L (ref 98–110)
CO2: 27 mmol/L (ref 20–31)
Creat: 0.62 mg/dL (ref 0.50–1.10)
Glucose, Bld: 97 mg/dL (ref 65–99)
POTASSIUM: 3.7 mmol/L (ref 3.5–5.3)
Sodium: 141 mmol/L (ref 135–146)
Total Protein: 7.2 g/dL (ref 6.1–8.1)

## 2015-05-04 LAB — TSH: TSH: 1.99 mIU/L

## 2015-05-04 LAB — LIPID PANEL
Cholesterol: 182 mg/dL (ref 125–200)
HDL: 48 mg/dL (ref >=46)
LDL CALC: 69 mg/dL (ref <130)
TRIGLYCERIDES: 323 mg/dL — AB (ref <150)
Total CHOL/HDL Ratio: 3.8 Ratio (ref <=5.0)
VLDL: 65 mg/dL — ABNORMAL HIGH (ref <30)

## 2015-05-04 LAB — T3: T3 TOTAL: 90 ng/dL (ref 76–181)

## 2015-05-04 LAB — HEMOGLOBIN A1C
Hgb A1c MFr Bld: 6 % — ABNORMAL HIGH (ref <5.7)
Mean Plasma Glucose: 126 mg/dL

## 2015-05-04 LAB — T4, FREE: Free T4: 1.2 ng/dL (ref 0.8–1.8)

## 2015-05-07 ENCOUNTER — Other Ambulatory Visit: Payer: Self-pay | Admitting: Medical

## 2015-05-07 MED ORDER — FERROUS GLUCONATE 324 (38 FE) MG PO TABS: 324.0000 mg | ORAL_TABLET | Freq: Every day | ORAL | Status: DC

## 2015-05-20 ENCOUNTER — Other Ambulatory Visit: Payer: Self-pay

## 2015-05-20 DIAGNOSIS — Z1231 Encounter for screening mammogram for malignant neoplasm of breast: Secondary | ICD-10-CM

## 2015-05-24 ENCOUNTER — Encounter: Payer: Self-pay | Admitting: Medical

## 2015-05-28 ENCOUNTER — Ambulatory Visit: Payer: No Typology Code available for payment source

## 2015-06-08 ENCOUNTER — Ambulatory Visit: Payer: No Typology Code available for payment source | Admitting: Dietician

## 2015-06-16 ENCOUNTER — Ambulatory Visit: Payer: No Typology Code available for payment source

## 2015-07-15 ENCOUNTER — Ambulatory Visit (INDEPENDENT_AMBULATORY_CARE_PROVIDER_SITE_OTHER): Payer: BLUE CROSS/BLUE SHIELD | Admitting: Medical

## 2015-07-15 ENCOUNTER — Encounter: Payer: Self-pay | Admitting: Medical

## 2015-07-15 VITALS — BP 132/98 | HR 79 | Wt 240.0 lb

## 2015-07-15 DIAGNOSIS — R7301 Impaired fasting glucose: Secondary | ICD-10-CM | POA: Diagnosis not present

## 2015-07-15 DIAGNOSIS — L853 Xerosis cutis: Secondary | ICD-10-CM | POA: Diagnosis not present

## 2015-07-15 DIAGNOSIS — R7989 Other specified abnormal findings of blood chemistry: Secondary | ICD-10-CM | POA: Diagnosis not present

## 2015-07-15 DIAGNOSIS — Z862 Personal history of diseases of the blood and blood-forming organs and certain disorders involving the immune mechanism: Secondary | ICD-10-CM

## 2015-07-15 DIAGNOSIS — E669 Obesity, unspecified: Secondary | ICD-10-CM

## 2015-07-15 DIAGNOSIS — I10 Essential (primary) hypertension: Secondary | ICD-10-CM

## 2015-07-15 DIAGNOSIS — R945 Abnormal results of liver function studies: Secondary | ICD-10-CM

## 2015-07-15 LAB — CBC
HEMATOCRIT: 36.1 % (ref 35.0–45.0)
Hemoglobin: 11.4 g/dL — ABNORMAL LOW (ref 11.7–15.5)
MCH: 23.3 pg — ABNORMAL LOW (ref 27.0–33.0)
MCHC: 31.6 g/dL — AB (ref 32.0–36.0)
MCV: 73.7 fL — AB (ref 80.0–100.0)
MPV: 9.3 fL (ref 7.5–12.5)
PLATELETS: 332 10*3/uL (ref 140–400)
RBC: 4.9 MIL/uL (ref 3.80–5.10)
RDW: 16.1 % — AB (ref 11.0–15.0)
WBC: 6.1 10*3/uL (ref 4.0–10.5)

## 2015-07-15 LAB — HEPATIC FUNCTION PANEL
ALBUMIN: 4.3 g/dL (ref 3.6–5.1)
ALK PHOS: 57 U/L (ref 33–115)
ALT: 33 U/L — ABNORMAL HIGH (ref 6–29)
AST: 20 U/L (ref 10–35)
BILIRUBIN TOTAL: 0.2 mg/dL (ref 0.2–1.2)
Bilirubin, Direct: 0 mg/dL (ref <=0.2)
Indirect Bilirubin: 0.2 mg/dL (ref 0.2–1.2)
TOTAL PROTEIN: 7.4 g/dL (ref 6.1–8.1)

## 2015-07-15 LAB — HEMOGLOBIN A1C
HEMOGLOBIN A1C: 5.5 % (ref <5.7)
Mean Plasma Glucose: 111 mg/dL

## 2015-07-15 NOTE — Progress Notes (Signed)
Subjective: Chief Complaint  Patient presents with  . Follow-up    fasting. on diabetes, liver test, and anemia. no other problems or concerns   Here for f/u on medications and abnormal labs from 04/2015 visit when her BP was elevated, LFT elevated, borderline diabetes.   At last visit in April.    Hypertension - compliant with Losartan started last visit, continues amlodipine 5mg  and stopped HCTZ.  Checking BPs at home, seeing 140s SBP at home.   She does note not taking her BP medication this morning for visit and ate something salty last night.  She also drank to alcohol drinks last night.   Anemia - denies bleeding or bruising.   Had colonoscopy in 2009 due to anemia.   Was reportedly normal other than internal hemorrhoids then.   Hasn't done stool cards yet from last visit.  Taking the [facility] once daily and it has turned stool color a little darker, but no other side effects.    Here for f/u on elevated LFTs.  She notes they have been elevated in the past while on Lamisil oral x 49mo from dermatology.   No hx/o hepatitis.    Since last visit took my advice and has lost 16lb through cutting out soda and sugar  She has to wash hands frequently at work, gets dry cracking skin on hands/fingers bilat.    Past Medical History  Diagnosis Date  . Hypertension   . History of blood transfusion 2009    2 units after hysterectomy  . Obesity   . Acanthosis nigricans   . Wears glasses   . Arthritis of knee   . Sleep apnea 2010    borderline no cpap used  . Anemia   . External hemorrhoid    Current Outpatient Prescriptions on File Prior to Visit  Medication Sig Dispense Refill  . amLODipine (NORVASC) 5 MG tablet Take 1 tablet (5 mg total) by mouth daily. 90 tablet 3  . diclofenac (VOLTAREN) 50 MG EC tablet Take 50 mg by mouth 2 (two) times daily.    . diclofenac sodium (VOLTAREN) 1 % GEL Apply topically 4 (four) times daily. Reported on 05/03/2015    . ferrous gluconate (FERGON) 324 MG tablet  Take 1 tablet (324 mg total) by mouth daily with breakfast. 90 tablet 0  . losartan (COZAAR) 25 MG tablet Take 1 tablet (25 mg total) by mouth daily. 90 tablet 3  . Multiple Vitamin (MULTIVITAMIN WITH MINERALS) TABS Take 1 tablet by mouth daily.    Marland Kitchen tolterodine (DETROL LA) 4 MG 24 hr capsule Take 1 capsule (4 mg total) by mouth daily. 90 capsule 3   No current facility-administered medications on file prior to visit.     ROS as in subjective    Objective: BP 140/98 mmHg  Pulse 79  Wt 240 lb (108.863 kg)  BP Readings from Last 3 Encounters:  07/15/15 140/98  05/03/15 146/94  04/29/14 124/78   Wt Readings from Last 3 Encounters:  07/15/15 240 lb (108.863 kg)  05/03/15 256 lb (116.121 kg)  04/29/14 239 lb (108.41 kg)   General appearance: alert, no distress, WD/WN  Oral cavity: MMM, no lesions Neck: supple, no lymphadenopathy, no thyromegaly, no masses Heart: RRR, normal S1, S2, no murmurs Lungs: CTA bilaterally, no wheezes, rhonchi, or rales Abdomen: +bs, soft, non tender, non distended, no masses, no hepatomegaly, no splenomegaly Pulses: 2+ symmetric, upper and lower extremities, normal cap refill Ext: no edema    Assessment: Encounter Diagnoses  Name Primary?  . Impaired fasting blood sugar Yes  . History of anemia   . Essential hypertension   . Obesity   . Elevated LFTs   . Dry skin    Plan: Impaired sugar, obesity  - repeat lab today, glad to see she lost weight, c/t efforts at weight loss.  Hx/o anemia - she has been compliant with Wynonia Lawman once daily.  Labs today, reminded her to turn in stool cards x 3 HTN - c/t amlodipine 5mg , increase to Losartan 50mg , c/t efforts at weight loss Elevated LFTs - repeat labs today Dry skin - advised daily moisturizing lotion, avoid alcohol based soaps, and can use OTC Cortaid prn.   Return pending labs, return stool cards, f/u 40mo BP.

## 2015-07-16 ENCOUNTER — Other Ambulatory Visit: Payer: Self-pay | Admitting: Medical

## 2015-07-17 ENCOUNTER — Other Ambulatory Visit: Payer: Self-pay | Admitting: Medical

## 2015-07-18 MED ORDER — FERROUS GLUCONATE 324 (38 FE) MG PO TABS: 324.0000 mg | ORAL_TABLET | Freq: Every day | ORAL | Status: DC

## 2015-07-18 NOTE — Addendum Note (Signed)
Addended by: Carlena Hurl on: 07/18/2015 07:50 PM   Modules accepted: Orders

## 2015-07-19 MED ORDER — AMLODIPINE BESYLATE 5 MG PO TABS: 5.0000 mg | ORAL_TABLET | Freq: Every day | ORAL | Status: DC

## 2015-07-19 MED ORDER — FERROUS GLUCONATE 324 (38 FE) MG PO TABS: 324.0000 mg | ORAL_TABLET | Freq: Every day | ORAL | Status: DC

## 2015-07-19 MED ORDER — LOSARTAN POTASSIUM 50 MG PO TABS: 50.0000 mg | ORAL_TABLET | Freq: Every day | ORAL | Status: DC

## 2015-07-19 NOTE — Addendum Note (Signed)
Addended by: Carlena Hurl on: 07/19/2015 07:48 AM   Modules accepted: Orders, Medications

## 2015-08-03 ENCOUNTER — Ambulatory Visit: Payer: No Typology Code available for payment source

## 2015-08-05 ENCOUNTER — Ambulatory Visit: Payer: No Typology Code available for payment source

## 2015-08-10 ENCOUNTER — Ambulatory Visit: Payer: No Typology Code available for payment source

## 2015-08-12 ENCOUNTER — Ambulatory Visit: Payer: No Typology Code available for payment source

## 2015-09-08 ENCOUNTER — Other Ambulatory Visit (INDEPENDENT_AMBULATORY_CARE_PROVIDER_SITE_OTHER): Payer: BLUE CROSS/BLUE SHIELD

## 2015-09-08 ENCOUNTER — Telehealth: Payer: Self-pay | Admitting: Medical

## 2015-09-08 DIAGNOSIS — Z23 Encounter for immunization: Secondary | ICD-10-CM

## 2015-09-08 NOTE — Telephone Encounter (Signed)
Pt came in for her flu shot and requested a referral to a ENT. She states that she called earlier about tonsils stones and was told to see her dentist. She states that she did and he told her to go to a ENT. She is requesting a referral for that. Pt can be reached at [phone].

## 2015-09-09 NOTE — Telephone Encounter (Signed)
Fine, refer to ENT

## 2015-09-09 NOTE — Telephone Encounter (Signed)
Sending to [facility]

## 2015-09-09 NOTE — Telephone Encounter (Signed)
Pt is aware.  

## 2015-09-14 ENCOUNTER — Telehealth: Payer: Self-pay | Admitting: Internal Medicine

## 2015-09-14 NOTE — Telephone Encounter (Signed)
Pt has been referred to [facility] (by Loma Sousa) for tonsils. [facility] will call patient today to schedule appt.

## 2015-09-16 ENCOUNTER — Telehealth: Payer: Self-pay | Admitting: Internal Medicine

## 2015-09-16 NOTE — Telephone Encounter (Signed)
Courtney sent over a referral to [facility] for pt regarding tonsils.   Patient is scheduled for 09/28/15 @ 1:00pm with Dr. Redmond Baseman at [facility]. Pt is aware of appt

## 2015-10-10 HISTORY — PX: KNEE SURGERY: SHX244

## 2015-10-11 ENCOUNTER — Ambulatory Visit (INDEPENDENT_AMBULATORY_CARE_PROVIDER_SITE_OTHER): Payer: BLUE CROSS/BLUE SHIELD | Admitting: Physician Assistant

## 2015-10-11 DIAGNOSIS — M25561 Pain in right knee: Secondary | ICD-10-CM

## 2015-10-11 DIAGNOSIS — M25562 Pain in left knee: Secondary | ICD-10-CM

## 2015-10-13 ENCOUNTER — Telehealth: Payer: Self-pay

## 2015-10-13 NOTE — Telephone Encounter (Signed)
Pt states that she was supposed to have a stool test to check for blood in her stool. Please advise.

## 2015-10-13 NOTE — Telephone Encounter (Signed)
From last lab eval, I wanted you guys to give her stool cards x 3 to do at home and return them for eval.  Make sure we get her the cards and instructions if she doesn't have them

## 2015-10-14 NOTE — Telephone Encounter (Signed)
Spoke to patient and will mail out stool cards

## 2015-10-15 ENCOUNTER — Encounter: Payer: Self-pay | Admitting: Medical

## 2015-10-15 ENCOUNTER — Ambulatory Visit: Payer: BLUE CROSS/BLUE SHIELD | Admitting: Medical

## 2015-10-15 ENCOUNTER — Ambulatory Visit (INDEPENDENT_AMBULATORY_CARE_PROVIDER_SITE_OTHER): Payer: BLUE CROSS/BLUE SHIELD | Admitting: Medical

## 2015-10-15 VITALS — BP 152/100 | HR 68 | Temp 97.8°F | Ht 64.0 in | Wt 237.0 lb

## 2015-10-15 DIAGNOSIS — R7989 Other specified abnormal findings of blood chemistry: Secondary | ICD-10-CM | POA: Diagnosis not present

## 2015-10-15 DIAGNOSIS — J039 Acute tonsillitis, unspecified: Secondary | ICD-10-CM | POA: Diagnosis not present

## 2015-10-15 DIAGNOSIS — J029 Acute pharyngitis, unspecified: Secondary | ICD-10-CM

## 2015-10-15 DIAGNOSIS — R945 Abnormal results of liver function studies: Secondary | ICD-10-CM

## 2015-10-15 DIAGNOSIS — I10 Essential (primary) hypertension: Secondary | ICD-10-CM

## 2015-10-15 DIAGNOSIS — Z862 Personal history of diseases of the blood and blood-forming organs and certain disorders involving the immune mechanism: Secondary | ICD-10-CM | POA: Diagnosis not present

## 2015-10-15 MED ORDER — AMLODIPINE BESYLATE 5 MG PO TABS: 5.0000 mg | ORAL_TABLET | Freq: Every day | ORAL | 3 refills | Status: DC

## 2015-10-15 MED ORDER — AMOXICILLIN 500 MG PO TABS: ORAL_TABLET | ORAL | 0 refills | Status: DC

## 2015-10-15 MED ORDER — LOSARTAN POTASSIUM 100 MG PO TABS: 100.0000 mg | ORAL_TABLET | Freq: Every day | ORAL | 3 refills | Status: DC

## 2015-10-15 NOTE — Progress Notes (Signed)
  Subjective: Kayla Larsen is a 45 y.o. female who presents for evaluation of sore throat x 3 days.   She notes popping in left ear, congestion in head, mucous drainage down throat, coughing up some mucous.  Denies fever, body aches.  + chills.  No NVD.  Nonsmoker.  Brother and cowroker has been sick.  Using mucinex.   Patient has not had a recent close exposure to someone with proven streptococcal pharyngitis.  No other aggravating or relieving factors.  No other c/o.  The following portions of the patient's history were reviewed and updated as appropriate: allergies, current medications, past medical history, past social history, past surgical history and problem list.  Past Medical History:  Diagnosis Date  . Acanthosis nigricans   . Anemia   . Arthritis of knee   . External hemorrhoid   . History of blood transfusion 2009   2 units after hysterectomy  . Hypertension   . Obesity   . Sleep apnea 2010   borderline no cpap used  . Wears glasses    Current Outpatient Prescriptions on File Prior to Visit  Medication Sig Dispense Refill  . diclofenac (VOLTAREN) 50 MG EC tablet Take 50 mg by mouth 2 (two) times daily.    . diclofenac sodium (VOLTAREN) 1 % GEL Apply topically 4 (four) times daily. Reported on 05/03/2015    . ferrous gluconate (FERGON) 324 MG tablet Take 1 tablet (324 mg total) by mouth daily with breakfast. 90 tablet 0  . Multiple Vitamin (MULTIVITAMIN WITH MINERALS) TABS Take 1 tablet by mouth daily.    Marland Kitchen tolterodine (DETROL LA) 4 MG 24 hr capsule Take 1 capsule (4 mg total) by mouth daily. 90 capsule 3   No current facility-administered medications on file prior to visit.     ROS as in subjective    Objective: BP (!) 152/100   Pulse 68   Temp 97.8 F (36.6 C) (Oral)   Ht 5\' 4"  (1.626 m)   Wt 237 lb (107.5 kg)   SpO2 98%   BMI 40.68 kg/m   BP Readings from Last 3 Encounters:  10/15/15 (!) 152/100  07/15/15 (!) 132/98  05/03/15 (!) 146/94   General  appearance: no distress, WD/WN, mildly ill-appearing HEENT: normocephalic, conjunctiva/corneas normal, sclerae anicteric, nares with turbinate edema, clear discharge,+ erythema, pharynx with erythema, tonsils 2-3+ bilat, white exudate right tonsil Oral cavity: MMM, no lesions  Neck: supple, shoddy tender anterior nodes, no thyromegaly Heart: RRR, normal S1, S2, no murmurs Lungs: CTA bilaterally, no wheezes, rhonchi, or rales  Laboratory Strep test done. Results:negative.    Assessment: Encounter Diagnoses  Name Primary?  . Essential hypertension Yes  . Sore throat   . Acute tonsillitis, unspecified etiology   . Elevated LFTs   . History of anemia      Plan: Sore throat, tonsillitis - advised warm fluids, add benadryl or other antihistamine over the next week, begin amoxicillin, discussed supportive care.  HTN - increase Losartan to 100mg  daily, c/t Amlodipine 5mg  daily  Elevated LFTs - lab in 3 wk as below  Anemia - turn in stool cards as discussed ASAP  F/u in 3-4 wk for [facility] lab

## 2015-10-29 ENCOUNTER — Ambulatory Visit: Payer: BLUE CROSS/BLUE SHIELD | Admitting: Medical

## 2015-11-04 DIAGNOSIS — M2341 Loose body in knee, right knee: Secondary | ICD-10-CM | POA: Diagnosis not present

## 2015-11-05 ENCOUNTER — Ambulatory Visit: Payer: BLUE CROSS/BLUE SHIELD | Admitting: Medical

## 2015-11-08 ENCOUNTER — Telehealth (INDEPENDENT_AMBULATORY_CARE_PROVIDER_SITE_OTHER): Payer: Self-pay | Admitting: Orthopaedic Surgery

## 2015-11-08 NOTE — Telephone Encounter (Signed)
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 Patient has appointment has on 11/11/2015. Also wants to know if she can start bending her knee slowly   Contact Info: [phone]

## 2015-11-08 NOTE — Telephone Encounter (Signed)
It is ok for her to start bending her knee.  Find out when she wants to go back to work.  I'm ok for whenever

## 2015-11-08 NOTE — Telephone Encounter (Signed)
Please advise 

## 2015-11-09 NOTE — Telephone Encounter (Signed)
Patient aware of the below message from Dr. Blackman  

## 2015-11-11 ENCOUNTER — Ambulatory Visit (INDEPENDENT_AMBULATORY_CARE_PROVIDER_SITE_OTHER): Payer: BLUE CROSS/BLUE SHIELD | Admitting: Physician Assistant

## 2015-11-11 DIAGNOSIS — Z9889 Other specified postprocedural states: Secondary | ICD-10-CM

## 2015-11-11 NOTE — Progress Notes (Signed)
Office Visit Note   Patient: Kayla Larsen           Date of Birth: April 30, 1970           MRN: [id number] Visit Date: 11/11/2015              Requested by: Carlena Hurl, PA-C [place], [place] [address] PCP: Crisoforo Oxford, PA-C   Assessment & Plan: Visit Diagnoses: No diagnosis found.  Plan: Sutures removed Steri-Strips applied weightbearing as tolerated. Range of motion of the knee encouraged.  Follow-Up Instructions: Return in about 4 weeks (around 12/09/2015).   Orders:  No orders of the defined types were placed in this encounter.  No orders of the defined types were placed in this encounter.     Procedures: No procedures performed   Clinical Data: No additional findings.   Subjective: Chief Complaint  Patient presents with  . Right Knee - Routine Post Op, Pain, Follow-up    1 WEEK PO    HPI  1 week status post right knee arthroscopy with removal of large loose body. Patient overall doing well some difficulty sleeping. Using crutches to ambulate.   Review of Systems   Objective: Vital Signs: There were no vitals taken for this visit.  Physical Exam  Ortho Exam Right calf supple nontender. Port sites were well possibly with nylon sutures no signs of infection. Specialty Comments:  No specialty comments available.  Imaging: No results found.   PMFS History: Patient Active Problem List   Diagnosis Date Noted  . Dry skin 07/15/2015  . Elevated LFTs 07/15/2015  . Acanthosis nigricans 05/03/2015  . Obesity 05/03/2015  . Overactive bladder 05/03/2015  . Essential hypertension 05/03/2015  . Routine general medical examination at a health care facility 05/03/2015  . Arthritis, senescent 05/03/2015  . History of anemia 05/03/2015  . Hypertriglyceridemia 05/03/2015  . Impaired fasting blood sugar 05/03/2015  . Goiter 05/03/2015  . Pelvic mass in female 07/31/2012   Past Medical History:  Diagnosis Date  .  Acanthosis nigricans   . Anemia   . Arthritis of knee   . External hemorrhoid   . History of blood transfusion 2009   2 units after hysterectomy  . Hypertension   . Obesity   . Sleep apnea 2010   borderline no cpap used  . Wears glasses     Family History  Problem Relation Age of Onset  . Cancer Mother     liver and lung  . Hypertension Mother   . Diabetes Father   . Hypertension Brother   . Cancer Maternal Aunt     thyroid  . Diabetes Maternal Aunt   . Hypertension Maternal Aunt   . Heart disease Maternal Grandmother   . Stroke Neg Hx   . Hypertension Brother   . Hypertension Brother   . Diabetes Maternal Uncle   . Hypertension Maternal Uncle     Past Surgical History:  Procedure Laterality Date  . ABDOMINAL HYSTERECTOMY  2009  . CESAREAN SECTION     x 2  . COLONOSCOPY  2009   done for anemia eval; Dr. Benson Norway  . LAPAROTOMY Left 07/30/2012   Procedure: LEFT SALPINGO OOPHERECTOMY,  OMENTECTOMY, AND LYSIS OF ADHESIONS;  Surgeon: Alvino Chapel, MD;  Location: [facility];  Service: Gynecology;  Laterality: Left;  . OOPHORECTOMY  2014   left   . removed a cyst on her ovaries  2014   Social History   Occupational History  .  Not on file.   Social History Main Topics  . Smoking status: Former Smoker    Packs/day: 0.25    Years: 4.00    Types: Cigarettes    Quit date: 09/29/2011  . Smokeless tobacco: Never Used  . Alcohol use 1.2 oz/week    2 Shots of liquor per week     Comment: occassional  . Drug use: No  . Sexual activity: No

## 2015-11-12 ENCOUNTER — Telehealth (INDEPENDENT_AMBULATORY_CARE_PROVIDER_SITE_OTHER): Payer: Self-pay | Admitting: Orthopaedic Surgery

## 2015-11-12 ENCOUNTER — Encounter (INDEPENDENT_AMBULATORY_CARE_PROVIDER_SITE_OTHER): Payer: Self-pay

## 2015-11-12 NOTE — Telephone Encounter (Signed)
Pt states she would not be able to go back to work on Monday 11/15/15, and needs a note stating she is still out. R knee is still hurting her from surgery. Please give pt a call when note is ready for pick up. Also pt has forms she would like completed and will pick up. Please call when those are ready as well.

## 2015-11-12 NOTE — Telephone Encounter (Signed)
Patient aware I have filled this form out and faxed it aswell.

## 2015-11-15 ENCOUNTER — Other Ambulatory Visit: Payer: Self-pay | Admitting: Medical

## 2015-11-15 DIAGNOSIS — Z1231 Encounter for screening mammogram for malignant neoplasm of breast: Secondary | ICD-10-CM

## 2015-11-16 ENCOUNTER — Other Ambulatory Visit (INDEPENDENT_AMBULATORY_CARE_PROVIDER_SITE_OTHER): Payer: BLUE CROSS/BLUE SHIELD

## 2015-11-16 ENCOUNTER — Ambulatory Visit
Admission: RE | Admit: 2015-11-16 | Discharge: 2015-11-16 | Disposition: A | Discharge status: Home / Self Care | Payer: BLUE CROSS/BLUE SHIELD | Source: Ambulatory Visit | Attending: Medical | Admitting: Medical

## 2015-11-16 ENCOUNTER — Other Ambulatory Visit: Payer: Self-pay | Admitting: Medical

## 2015-11-16 ENCOUNTER — Telehealth: Payer: Self-pay | Admitting: Medical

## 2015-11-16 DIAGNOSIS — Z1211 Encounter for screening for malignant neoplasm of colon: Secondary | ICD-10-CM

## 2015-11-16 DIAGNOSIS — Z1231 Encounter for screening mammogram for malignant neoplasm of breast: Secondary | ICD-10-CM

## 2015-11-16 LAB — HEMOCCULT GUIAC POC 1CARD (OFFICE)
Card #2 Fecal Occult Blod, POC: NEGATIVE
FECAL OCCULT BLD: NEGATIVE
FECAL OCCULT BLD: NEGATIVE

## 2015-11-16 NOTE — Telephone Encounter (Signed)
As a follow up from last visit showing anemia, have her turn in the stool cards x 3 to check for blood.  I think she has the 3 stool card kit at home

## 2015-11-16 NOTE — Telephone Encounter (Signed)
Called patient about stool cards she will bring them by the office  Today 11/16/2015

## 2015-11-18 ENCOUNTER — Other Ambulatory Visit: Payer: Self-pay | Admitting: Medical

## 2015-11-18 DIAGNOSIS — R928 Other abnormal and inconclusive findings on diagnostic imaging of breast: Secondary | ICD-10-CM

## 2015-11-23 ENCOUNTER — Ambulatory Visit: Payer: BLUE CROSS/BLUE SHIELD | Admitting: Medical

## 2015-11-24 ENCOUNTER — Ambulatory Visit
Admission: RE | Admit: 2015-11-24 | Discharge: 2015-11-24 | Disposition: A | Discharge status: Home / Self Care | Payer: BLUE CROSS/BLUE SHIELD | Source: Ambulatory Visit | Attending: Medical | Admitting: Medical

## 2015-11-24 DIAGNOSIS — R928 Other abnormal and inconclusive findings on diagnostic imaging of breast: Secondary | ICD-10-CM

## 2015-11-25 ENCOUNTER — Ambulatory Visit (INDEPENDENT_AMBULATORY_CARE_PROVIDER_SITE_OTHER): Payer: BLUE CROSS/BLUE SHIELD | Admitting: Medical

## 2015-11-25 ENCOUNTER — Encounter: Payer: Self-pay | Admitting: Medical

## 2015-11-25 VITALS — BP 150/92 | HR 65 | Wt 236.2 lb

## 2015-11-25 DIAGNOSIS — E669 Obesity, unspecified: Secondary | ICD-10-CM

## 2015-11-25 DIAGNOSIS — I10 Essential (primary) hypertension: Secondary | ICD-10-CM | POA: Diagnosis not present

## 2015-11-25 DIAGNOSIS — R7989 Other specified abnormal findings of blood chemistry: Secondary | ICD-10-CM

## 2015-11-25 DIAGNOSIS — Z862 Personal history of diseases of the blood and blood-forming organs and certain disorders involving the immune mechanism: Secondary | ICD-10-CM | POA: Diagnosis not present

## 2015-11-25 DIAGNOSIS — R7301 Impaired fasting glucose: Secondary | ICD-10-CM | POA: Diagnosis not present

## 2015-11-25 DIAGNOSIS — R945 Abnormal results of liver function studies: Secondary | ICD-10-CM

## 2015-11-25 MED ORDER — OLMESARTAN-AMLODIPINE-HCTZ 40-5-12.5 MG PO TABS: 1.0000 | ORAL_TABLET | Freq: Every day | ORAL | 3 refills | Status: DC

## 2015-11-25 NOTE — Progress Notes (Signed)
Subjective: Chief Complaint  Patient presents with  . follow up b/p    follow up b/p   Here for BP f/u.  She came in a month ago for sore throat, but last 3 BPs here have been elevated.  Last visit we increased Losartan form 50-100mg  daily, continued Amlodipine 5mg  daily.   No chest pain, no dyspnea, no swelling, no palpations.   No headaches.   Is still s/p knee surgery from recently, so not back to full exercise at this time, still followed by ortho.   Diet - not so good of late.    She does snore.  Had sleep study around 9 years ago.   No witnessed apnea.   Most of the time wakes rested.  Denies being sleepy during her shift at work.     No family hx/o breast cancer.   She is doing self breast exams.  She had mammogram and [place] yesterday, was advised f/u US and mammogram left breast in 44mo.   Past Medical History:  Diagnosis Date  . Acanthosis nigricans   . Anemia   . Arthritis of knee   . External hemorrhoid   . History of blood transfusion 2009   2 units after hysterectomy  . Hypertension   . Obesity   . Sleep apnea 2010   borderline no cpap used  . Wears glasses    Current Outpatient Prescriptions on File Prior to Visit  Medication Sig Dispense Refill  . amLODipine (NORVASC) 5 MG tablet Take 1 tablet (5 mg total) by mouth daily. 90 tablet 3  . diclofenac (VOLTAREN) 50 MG EC tablet Take 50 mg by mouth 2 (two) times daily.    . diclofenac sodium (VOLTAREN) 1 % GEL Apply topically 4 (four) times daily. Reported on 05/03/2015    . ferrous gluconate (FERGON) 324 MG tablet Take 1 tablet (324 mg total) by mouth daily with breakfast. 90 tablet 0  . losartan (COZAAR) 100 MG tablet Take 1 tablet (100 mg total) by mouth daily. 90 tablet 3  . Multiple Vitamin (MULTIVITAMIN WITH MINERALS) TABS Take 1 tablet by mouth daily.    Marland Kitchen tolterodine (DETROL LA) 4 MG 24 hr capsule Take 1 capsule (4 mg total) by mouth daily. 90 capsule 3   No current facility-administered medications on file prior to  visit.    ROS as in subjective   Objective: BP (!) 150/92   Pulse 65   Wt 236 lb 3.2 oz (107.1 kg)   SpO2 97%   BMI 40.54 kg/m   BP Readings from Last 3 Encounters:  11/25/15 (!) 150/92  10/15/15 (!) 152/100  07/15/15 (!) 132/98   Wt Readings from Last 3 Encounters:  11/25/15 236 lb 3.2 oz (107.1 kg)  10/15/15 237 lb (107.5 kg)  07/15/15 240 lb (108.9 kg)    General appearance: alert, no distress, WD/WN Neck: supple, no lymphadenopathy,moderate unchanged goiter, no bruits Heart: RRR, normal S1, S2, no murmurs Lungs: CTA bilaterally, no wheezes, rhonchi, or rales Extremities: no edema, no cyanosis, no clubbing Pulses: 2+ symmetric, upper and lower extremities, normal cap refill    Assessment: Encounter Diagnoses  Name Primary?  . Essential hypertension Yes  . Impaired fasting blood sugar   . Class 1 obesity with serious comorbidity in adult, unspecified BMI, unspecified obesity type   . Elevated LFTs   . History of anemia      Plan: Hypertension - stop amlodipine and losartan.  Begin Tribenzor.  Gave coupon card.  Discussed risks/benefits  of medication. She will check insurance coverage for sleep study and echo.  Hx/o OSA mild, not on CPAP.  Will repeat labs for liver, impaired glucose in 04/2016.    She will do f/u Mammo and breast US in 17mo, c/t home self month breast exams  discussed diet, exercise, weight loss recommendations   Kandiss was seen today for follow up b/p.  Diagnoses and all orders for this visit:  Essential hypertension  Impaired fasting blood sugar  Class 1 obesity with serious comorbidity in adult, unspecified BMI, unspecified obesity type  Elevated LFTs  History of anemia  Other orders -     Olmesartan-Amlodipine-HCTZ 40-5-12.5 MG TABS; Take 1 tablet by mouth daily.

## 2015-11-25 NOTE — Patient Instructions (Signed)
Call insurance to find out your portion to do a sleep study and echocardiogram  I assume this will have to go towards deductible  Let me know, and we can decide together to potentially do one or both of these studies  In the meantime, stop the amlodipine and losartan, and begin Tribenzor which as 3 BP medications in 1 pill once daily  If this is too expensive let me know right away  Try cutting way back on meat and daily   DASH Eating Plan DASH stands for "Dietary Approaches to Stop Hypertension." The DASH eating plan is a healthy eating plan that has been shown to reduce high blood pressure (hypertension). Additional health benefits may include reducing the risk of type 2 diabetes mellitus, heart disease, and stroke. The DASH eating plan may also help with weight loss. WHAT DO I NEED TO KNOW ABOUT THE DASH EATING PLAN? For the DASH eating plan, you will follow these general guidelines:  Choose foods with a percent daily value for sodium of less than 5% (as listed on the food label).  Use salt-free seasonings or herbs instead of table salt or sea salt.  Check with your health care provider or pharmacist before using salt substitutes.  Eat lower-sodium products, often labeled as "lower sodium" or "no salt added."  Eat fresh foods.  Eat more vegetables, fruits, and low-fat dairy products.  Choose whole grains. Look for the word "whole" as the first word in the ingredient list.  Choose fish and skinless chicken or [place] more often than red meat. Limit fish, poultry, and meat to 6 oz (170 g) each day.  Limit sweets, desserts, sugars, and sugary drinks.  Choose heart-healthy fats.  Limit cheese to 1 oz (28 g) per day.  Eat more home-cooked food and less restaurant, buffet, and fast food.  Limit fried foods.  Cook foods using methods other than frying.  Limit canned vegetables. If you do use them, rinse them well to decrease the sodium.  When eating at a restaurant, ask  that your food be prepared with less salt, or no salt if possible. WHAT FOODS CAN I EAT? Seek help from a dietitian for individual calorie needs. Grains Whole grain or whole wheat bread. Brown rice. Whole grain or whole wheat pasta. Quinoa, bulgur, and whole grain cereals. Low-sodium cereals. Corn or whole wheat flour tortillas. Whole grain cornbread. Whole grain crackers. Low-sodium crackers. Vegetables Fresh or frozen vegetables (raw, steamed, roasted, or grilled). Low-sodium or reduced-sodium tomato and vegetable juices. Low-sodium or reduced-sodium tomato sauce and paste. Low-sodium or reduced-sodium canned vegetables.  Fruits All fresh, canned (in natural juice), or frozen fruits. Meat and Other Protein Products Ground beef (85% or leaner), grass-fed beef, or beef trimmed of fat. Skinless chicken or [place]. Ground chicken or [place]. Pork trimmed of fat. All fish and seafood. Eggs. Dried beans, peas, or lentils. Unsalted nuts and seeds. Unsalted canned beans. Dairy Low-fat dairy products, such as skim or 1% milk, 2% or reduced-fat cheeses, low-fat ricotta or cottage cheese, or plain low-fat yogurt. Low-sodium or reduced-sodium cheeses. Fats and Oils Tub margarines without trans fats. Light or reduced-fat mayonnaise and salad dressings (reduced sodium). Avocado. Safflower, olive, or canola oils. Natural peanut or almond butter. Other Unsalted popcorn and pretzels. The items listed above may not be a complete list of recommended foods or beverages. Contact your dietitian for more options. WHAT FOODS ARE NOT RECOMMENDED? Grains White bread. White pasta. White rice. Refined cornbread. Bagels and croissants. Crackers that  contain trans fat. Vegetables Creamed or fried vegetables. Vegetables in a cheese sauce. Regular canned vegetables. Regular canned tomato sauce and paste. Regular tomato and vegetable juices. Fruits Dried fruits. Canned fruit in light or heavy syrup. Fruit juice. Meat and  Other Protein Products Fatty cuts of meat. Ribs, chicken wings, bacon, sausage, bologna, salami, chitterlings, fatback, hot dogs, bratwurst, and packaged luncheon meats. Salted nuts and seeds. Canned beans with salt. Dairy Whole or 2% milk, cream, half-and-half, and cream cheese. Whole-fat or sweetened yogurt. Full-fat cheeses or blue cheese. Nondairy creamers and whipped toppings. Processed cheese, cheese spreads, or cheese curds. Condiments Onion and garlic salt, seasoned salt, table salt, and sea salt. Canned and packaged gravies. Worcestershire sauce. Tartar sauce. Barbecue sauce. Teriyaki sauce. Soy sauce, including reduced sodium. Steak sauce. Fish sauce. Oyster sauce. Cocktail sauce. Horseradish. Ketchup and mustard. Meat flavorings and tenderizers. Bouillon cubes. Hot sauce. Tabasco sauce. Marinades. Taco seasonings. Relishes. Fats and Oils Butter, stick margarine, lard, shortening, ghee, and bacon fat. Coconut, palm kernel, or palm oils. Regular salad dressings. Other Pickles and olives. Salted popcorn and pretzels. The items listed above may not be a complete list of foods and beverages to avoid. Contact your dietitian for more information. WHERE CAN I FIND MORE INFORMATION? National Heart, Lung, and Blood Institute: travelstabloid.com Document Released: 12/15/2010 Document Revised: 05/12/2013 Document Reviewed: 10/30/2012 [facility] Patient Information 2015 [place], [place]. This information is not intended to replace advice given to you by your health care provider. Make sure you discuss any questions you have with your health care provider.        Why follow it? Research shows. . Those who follow the Mediterranean diet have a reduced risk of heart disease  . The diet is associated with a reduced incidence of Parkinson's and Alzheimer's diseases . People following the diet may have longer life expectancies and lower rates of chronic diseases  . The  Dietary Guidelines for Americans recommends the Mediterranean diet as an eating plan to promote health and prevent disease  What Is the Mediterranean Diet?  . Healthy eating plan based on typical foods and recipes of Mediterranean-style cooking . The diet is primarily a plant based diet; these foods should make up a majority of meals   Starches - Plant based foods should make up a majority of meals - They are an important sources of vitamins, minerals, energy, antioxidants, and fiber - Choose whole grains, foods high in fiber and minimally processed items  - Typical grain sources include wheat, oats, barley, corn, brown rice, bulgar, farro, millet, polenta, couscous  - Various types of beans include chickpeas, lentils, fava beans, black beans, white beans   Fruits  Veggies - Large quantities of antioxidant rich fruits & veggies; 6 or more servings  - Vegetables can be eaten raw or lightly drizzled with oil and cooked  - Vegetables common to the traditional Mediterranean Diet include: artichokes, arugula, beets, broccoli, brussel sprouts, cabbage, carrots, celery, collard greens, cucumbers, eggplant, kale, leeks, lemons, lettuce, mushrooms, okra, onions, peas, peppers, potatoes, pumpkin, radishes, rutabaga, shallots, spinach, sweet potatoes, turnips, zucchini - Fruits common to the Mediterranean Diet include: apples, apricots, avocados, cherries, clementines, dates, figs, grapefruits, grapes, melons, nectarines, oranges, peaches, pears, pomegranates, strawberries, tangerines  Fats - Replace butter and margarine with healthy oils, such as olive oil, canola oil, and tahini  - Limit nuts to no more than a handful a day  - Nuts include walnuts, almonds, pecans, pistachios, pine nuts  - Limit or avoid candied,  honey roasted or heavily salted nuts - Olives are central to the Mediterranean diet - can be eaten whole or used in a variety of dishes   Meats Protein - Limiting red meat: no more than a few  times a month - When eating red meat: choose lean cuts and keep the portion to the size of deck of cards - Eggs: approx. 0 to 4 times a week  - Fish and lean poultry: at least 2 a week  - Healthy protein sources include, chicken, [place], lean beef, lamb - Increase intake of seafood such as tuna, salmon, trout, mackerel, shrimp, scallops - Avoid or limit high fat processed meats such as sausage and bacon  Dairy - Include moderate amounts of low fat dairy products  - Focus on healthy dairy such as fat free yogurt, skim milk, low or reduced fat cheese - Limit dairy products higher in fat such as whole or 2% milk, cheese, ice cream  Alcohol - Moderate amounts of red wine is ok  - No more than 5 oz daily for women (all ages) and men older than age 25  - No more than 10 oz of wine daily for men younger than 64  Other - Limit sweets and other desserts  - Use herbs and spices instead of salt to flavor foods  - Herbs and spices common to the traditional Mediterranean Diet include: basil, bay leaves, chives, cloves, cumin, fennel, garlic, lavender, marjoram, mint, oregano, parsley, pepper, rosemary, sage, savory, sumac, tarragon, thyme   It's not just a diet, it's a lifestyle:  . The Mediterranean diet includes lifestyle factors typical of those in the region  . Foods, drinks and meals are best eaten with others and savored . Daily physical activity is important for overall good health . This could be strenuous exercise like running and aerobics . This could also be more leisurely activities such as walking, housework, yard-work, or taking the stairs . Moderation is the key; a balanced and healthy diet accommodates most foods and drinks . Consider portion sizes and frequency of consumption of certain foods   Meal Ideas & Options:  . Breakfast:  o Whole wheat toast or whole wheat English muffins with peanut butter & hard boiled egg o Steel cut oats topped with apples & cinnamon and skim milk   o Fresh fruit: banana, strawberries, melon, berries, peaches  o Smoothies: strawberries, bananas, greek yogurt, peanut butter o Low fat greek yogurt with blueberries and granola  o Egg white omelet with spinach and mushrooms o Breakfast couscous: whole wheat couscous, apricots, skim milk, cranberries  . Sandwiches:  o Hummus and grilled vegetables (peppers, zucchini, squash) on whole wheat bread   o Grilled chicken on whole wheat pita with lettuce, tomatoes, cucumbers or tzatziki  o Tuna salad on whole wheat bread: tuna salad made with greek yogurt, olives, red peppers, capers, green onions o Garlic rosemary lamb pita: lamb sauted with garlic, rosemary, salt & pepper; add lettuce, cucumber, greek yogurt to pita - flavor with lemon juice and black pepper  . Seafood:  o Mediterranean grilled salmon, seasoned with garlic, basil, parsley, lemon juice and black pepper o Shrimp, lemon, and spinach whole-grain pasta salad made with low fat greek yogurt  o Seared scallops with lemon orzo  o Seared tuna steaks seasoned salt, pepper, coriander topped with tomato mixture of olives, tomatoes, olive oil, minced garlic, parsley, green onions and cappers  . Meats:  o Herbed greek chicken salad with kalamata olives, cucumber,  feta  o Red bell peppers stuffed with spinach, bulgur, lean ground beef (or lentils) & topped with feta   o Kebabs: skewers of chicken, tomatoes, onions, zucchini, squash  o [place] burgers: made with red onions, mint, dill, lemon juice, feta cheese topped with roasted red peppers . Vegetarian o Cucumber salad: cucumbers, artichoke hearts, celery, red onion, feta cheese, tossed in olive oil & lemon juice  o Hummus and whole grain pita points with a greek salad (lettuce, tomato, feta, olives, cucumbers, red onion) o Lentil soup with celery, carrots made with vegetable broth, garlic, salt and pepper  o Tabouli salad: parsley, bulgur, mint, scallions, cucumbers, tomato, radishes, lemon  juice, olive oil, salt and pepper.

## 2015-12-13 ENCOUNTER — Ambulatory Visit (INDEPENDENT_AMBULATORY_CARE_PROVIDER_SITE_OTHER): Payer: BLUE CROSS/BLUE SHIELD | Admitting: Physician Assistant

## 2015-12-13 DIAGNOSIS — M25561 Pain in right knee: Secondary | ICD-10-CM | POA: Diagnosis not present

## 2015-12-13 DIAGNOSIS — Z9889 Other specified postprocedural states: Secondary | ICD-10-CM | POA: Insufficient documentation

## 2015-12-13 MED ORDER — LIDOCAINE HCL 1 % IJ SOLN
3.0000 mL | INTRAMUSCULAR | Status: AC | PRN
  Administered 2015-12-13: 3 mL

## 2015-12-13 MED ORDER — METHYLPREDNISOLONE ACETATE 40 MG/ML IJ SUSP
40.0000 mg | INTRAMUSCULAR | Status: AC | PRN
  Administered 2015-12-13: 40 mg via INTRA_ARTICULAR

## 2015-12-13 NOTE — Progress Notes (Signed)
Office Visit Note   Patient: Kayla Larsen           Date of Birth: 09-Feb-1970           MRN: [id number] Visit Date: 12/13/2015              Requested by: Carlena Hurl, PA-C [address] [place], [place] [address] PCP: Crisoforo Oxford, PA-C   Assessment & Plan: Visit Diagnoses:  1. S/P right knee arthroscopy     Plan: Quad strengthening. Reminded her that she may have swelling for 3-6 months. She understands she can only have cortisone injection every 3 months.  Follow-Up Instructions: Return if symptoms worsen or fail to improve.   Orders:  Orders Placed This Encounter  Procedures  . Large Joint Injection/Arthrocentesis   No orders of the defined types were placed in this encounter.     Procedures: Large Joint Inj Date/Time: 12/13/2015 8:20 AM Performed by: Pete Pelt Authorized by: Pete Pelt   Consent Given by:  Patient Indications:  Pain Location:  Knee Site:  R knee Needle Size:  22 G Approach:  Anterolateral Ultrasound Guidance: No   Fluoroscopic Guidance: No   Medications:  40 mg methylPREDNISolone acetate 40 MG/ML; 3 mL lidocaine 1 % Aspiration Attempted: No   Patient tolerance:  Patient tolerated the procedure well with no immediate complications     Clinical Data: No additional findings.   Subjective: Chief Complaint  Patient presents with  . Right Knee - Routine Post Op    HPI Kayla Larsen returns today status post right knee arthroscopy 11/04/2015. Still having some discomfort in the knee but overall her pain is much improved. He is requesting a cortisone injection in the right knee today. She also wants to return to work tomorrow Review of Systems   Objective: Vital Signs: There were no vitals taken for this visit.  Physical Exam  Ortho Exam Right knee good range of motion. Port sites healing well no signs of infection. No abnormal warmth erythema or effusion. Specialty Comments:  No specialty comments  available.  Imaging: No results found.   PMFS History: Patient Active Problem List   Diagnosis Date Noted  . S/P right knee arthroscopy 12/13/2015  . Dry skin 07/15/2015  . Elevated LFTs 07/15/2015  . Acanthosis nigricans 05/03/2015  . Obesity 05/03/2015  . Overactive bladder 05/03/2015  . Essential hypertension 05/03/2015  . Routine general medical examination at a health care facility 05/03/2015  . Arthritis, senescent 05/03/2015  . History of anemia 05/03/2015  . Hypertriglyceridemia 05/03/2015  . Impaired fasting blood sugar 05/03/2015  . Goiter 05/03/2015  . Pelvic mass in female 07/31/2012   Past Medical History:  Diagnosis Date  . Acanthosis nigricans   . Anemia   . Arthritis of knee   . External hemorrhoid   . History of blood transfusion 2009   2 units after hysterectomy  . Hypertension   . Obesity   . Sleep apnea 2010   borderline no cpap used  . Wears glasses     Family History  Problem Relation Age of Onset  . Cancer Mother     liver and lung  . Hypertension Mother   . Diabetes Father   . Hypertension Brother   . Hypertension Brother   . Hypertension Brother   . Cancer Maternal Aunt     thyroid  . Diabetes Maternal Aunt   . Hypertension Maternal Aunt   . Heart disease Maternal Grandmother   .  Diabetes Maternal Uncle   . Hypertension Maternal Uncle   . Stroke Neg Hx     Past Surgical History:  Procedure Laterality Date  . ABDOMINAL HYSTERECTOMY  2009  . CESAREAN SECTION     x 2  . COLONOSCOPY  2009   done for anemia eval; Dr. Benson Norway  . LAPAROTOMY Left 07/30/2012   Procedure: LEFT SALPINGO OOPHERECTOMY,  OMENTECTOMY, AND LYSIS OF ADHESIONS;  Surgeon: Alvino Chapel, MD;  Location: [facility];  Service: Gynecology;  Laterality: Left;  . OOPHORECTOMY  2014   left   . removed a cyst on her ovaries  2014   Social History   Occupational History  . Not on file.   Social History Main Topics  . Smoking status: Former Smoker    Packs/day:  0.25    Years: 4.00    Types: Cigarettes    Quit date: 09/29/2011  . Smokeless tobacco: Never Used  . Alcohol use 1.2 oz/week    2 Shots of liquor per week     Comment: occassional  . Drug use: No  . Sexual activity: No

## 2015-12-13 NOTE — Progress Notes (Deleted)
   Post-Op Visit Note   Patient: Kayla Larsen           Date of Birth: Jul 11, 1970           MRN: [id number] Visit Date: 12/13/2015 PCP: Crisoforo Oxford, PA-C   Assessment & Plan:  Chief Complaint:  Chief Complaint  Patient presents with  . Right Knee - Routine Post Op   Visit Diagnoses: No diagnosis found.  Plan: ***  Follow-Up Instructions: No Follow-up on file.   Orders:  No orders of the defined types were placed in this encounter.  No orders of the defined types were placed in this encounter.  HPI Patient returns for four week recheck status post right knee arthroscopy on 11/04/2015. She requests cortisone injection for the pain prior to returning to work tomorrow. She is doing better. She takes Diclofenac and Tylenol for pain.  /PMFS History: Patient Active Problem List   Diagnosis Date Noted  . Dry skin 07/15/2015  . Elevated LFTs 07/15/2015  . Acanthosis nigricans 05/03/2015  . Obesity 05/03/2015  . Overactive bladder 05/03/2015  . Essential hypertension 05/03/2015  . Routine general medical examination at a health care facility 05/03/2015  . Arthritis, senescent 05/03/2015  . History of anemia 05/03/2015  . Hypertriglyceridemia 05/03/2015  . Impaired fasting blood sugar 05/03/2015  . Goiter 05/03/2015  . Pelvic mass in female 07/31/2012   Past Medical History:  Diagnosis Date  . Acanthosis nigricans   . Anemia   . Arthritis of knee   . External hemorrhoid   . History of blood transfusion 2009   2 units after hysterectomy  . Hypertension   . Obesity   . Sleep apnea 2010   borderline no cpap used  . Wears glasses     Family History  Problem Relation Age of Onset  . Cancer Mother     liver and lung  . Hypertension Mother   . Diabetes Father   . Hypertension Brother   . Hypertension Brother   . Hypertension Brother   . Cancer Maternal Aunt     thyroid  . Diabetes Maternal Aunt   . Hypertension Maternal Aunt   . Heart disease  Maternal Grandmother   . Diabetes Maternal Uncle   . Hypertension Maternal Uncle   . Stroke Neg Hx     Past Surgical History:  Procedure Laterality Date  . ABDOMINAL HYSTERECTOMY  2009  . CESAREAN SECTION     x 2  . COLONOSCOPY  2009   done for anemia eval; Dr. Benson Norway  . LAPAROTOMY Left 07/30/2012   Procedure: LEFT SALPINGO OOPHERECTOMY,  OMENTECTOMY, AND LYSIS OF ADHESIONS;  Surgeon: Alvino Chapel, MD;  Location: [facility];  Service: Gynecology;  Laterality: Left;  . OOPHORECTOMY  2014   left   . removed a cyst on her ovaries  2014   Social History   Occupational History  . Not on file.   Social History Main Topics  . Smoking status: Former Smoker    Packs/day: 0.25    Years: 4.00    Types: Cigarettes    Quit date: 09/29/2011  . Smokeless tobacco: Never Used  . Alcohol use 1.2 oz/week    2 Shots of liquor per week     Comment: occassional  . Drug use: No  . Sexual activity: No

## 2015-12-13 NOTE — Progress Notes (Deleted)
Office Visit Note   Patient: Kayla Larsen           Date of Birth: 08-24-1970           MRN: [id number] Visit Date: 12/13/2015              Requested by: Carlena Hurl, PA-C [place], [place] [address] PCP: Crisoforo Oxford, PA-C   Assessment & Plan: Visit Diagnoses: No diagnosis found.  Plan: ***  Follow-Up Instructions: No Follow-up on file.   Orders:  No orders of the defined types were placed in this encounter.  No orders of the defined types were placed in this encounter.     Procedures: No procedures performed   Clinical Data: No additional findings.   Subjective: Chief Complaint  Patient presents with  . Right Knee - Routine Post Op    HPI  Review of Systems   Objective: Vital Signs: There were no vitals taken for this visit.  Physical Exam  Ortho Exam  Specialty Comments:  No specialty comments available.  Imaging: No results found.   PMFS History: Patient Active Problem List   Diagnosis Date Noted  . Dry skin 07/15/2015  . Elevated LFTs 07/15/2015  . Acanthosis nigricans 05/03/2015  . Obesity 05/03/2015  . Overactive bladder 05/03/2015  . Essential hypertension 05/03/2015  . Routine general medical examination at a health care facility 05/03/2015  . Arthritis, senescent 05/03/2015  . History of anemia 05/03/2015  . Hypertriglyceridemia 05/03/2015  . Impaired fasting blood sugar 05/03/2015  . Goiter 05/03/2015  . Pelvic mass in female 07/31/2012   Past Medical History:  Diagnosis Date  . Acanthosis nigricans   . Anemia   . Arthritis of knee   . External hemorrhoid   . History of blood transfusion 2009   2 units after hysterectomy  . Hypertension   . Obesity   . Sleep apnea 2010   borderline no cpap used  . Wears glasses     Family History  Problem Relation Age of Onset  . Cancer Mother     liver and lung  . Hypertension Mother   . Diabetes Father   . Hypertension Brother   .  Hypertension Brother   . Hypertension Brother   . Cancer Maternal Aunt     thyroid  . Diabetes Maternal Aunt   . Hypertension Maternal Aunt   . Heart disease Maternal Grandmother   . Diabetes Maternal Uncle   . Hypertension Maternal Uncle   . Stroke Neg Hx     Past Surgical History:  Procedure Laterality Date  . ABDOMINAL HYSTERECTOMY  2009  . CESAREAN SECTION     x 2  . COLONOSCOPY  2009   done for anemia eval; Dr. Benson Norway  . LAPAROTOMY Left 07/30/2012   Procedure: LEFT SALPINGO OOPHERECTOMY,  OMENTECTOMY, AND LYSIS OF ADHESIONS;  Surgeon: Alvino Chapel, MD;  Location: [facility];  Service: Gynecology;  Laterality: Left;  . OOPHORECTOMY  2014   left   . removed a cyst on her ovaries  2014   Social History   Occupational History  . Not on file.   Social History Main Topics  . Smoking status: Former Smoker    Packs/day: 0.25    Years: 4.00    Types: Cigarettes    Quit date: 09/29/2011  . Smokeless tobacco: Never Used  . Alcohol use 1.2 oz/week    2 Shots of liquor per week     Comment: occassional  .  Drug use: No  . Sexual activity: No

## 2015-12-27 ENCOUNTER — Ambulatory Visit: Payer: No Typology Code available for payment source | Admitting: Obstetrics

## 2016-01-17 ENCOUNTER — Ambulatory Visit: Payer: BLUE CROSS/BLUE SHIELD | Admitting: Obstetrics

## 2016-01-28 DIAGNOSIS — J358 Other chronic diseases of tonsils and adenoids: Secondary | ICD-10-CM | POA: Insufficient documentation

## 2016-03-15 ENCOUNTER — Ambulatory Visit (INDEPENDENT_AMBULATORY_CARE_PROVIDER_SITE_OTHER): Payer: BLUE CROSS/BLUE SHIELD | Admitting: Physician Assistant

## 2016-03-15 ENCOUNTER — Encounter (INDEPENDENT_AMBULATORY_CARE_PROVIDER_SITE_OTHER): Payer: Self-pay | Admitting: Physician Assistant

## 2016-03-15 DIAGNOSIS — M25561 Pain in right knee: Secondary | ICD-10-CM

## 2016-03-15 DIAGNOSIS — M25562 Pain in left knee: Secondary | ICD-10-CM

## 2016-03-15 MED ORDER — METHYLPREDNISOLONE ACETATE 40 MG/ML IJ SUSP
40.0000 mg | INTRAMUSCULAR | Status: AC | PRN
  Administered 2016-03-15: 40 mg via INTRA_ARTICULAR

## 2016-03-15 MED ORDER — LIDOCAINE HCL 1 % IJ SOLN
3.0000 mL | INTRAMUSCULAR | Status: AC | PRN
  Administered 2016-03-15: 3 mL

## 2016-03-15 MED ORDER — DICLOFENAC SODIUM 50 MG PO TBEC: 50.0000 mg | DELAYED_RELEASE_TABLET | Freq: Two times a day (BID) | ORAL | 3 refills | Status: DC

## 2016-03-15 NOTE — Progress Notes (Signed)
Office Visit Note   Patient: Kayla Larsen           Date of Birth: 12-Feb-1970           MRN: [id number] Visit Date: 03/15/2016              Requested by: Carlena Hurl, PA-C [address] [place], [place] [address] PCP: Crisoforo Oxford, PA-C   Assessment & Plan: Visit Diagnoses:  1. Pain in both knees, unspecified chronicity     Plan: I'll see her back in 2 weeks' check progress lack of. Pain persist in left knee would recommend an AP lateral views of the left knee. Did refill her diclofenac today.  Follow-Up Instructions: Return in about 2 weeks (around 03/29/2016).   Orders:  Orders Placed This Encounter  Procedures  . Large Joint Injection/Arthrocentesis  . Large Joint Injection/Arthrocentesis   Meds ordered this encounter  Medications  . diclofenac (VOLTAREN) 50 MG EC tablet    Sig: Take 1 tablet (50 mg total) by mouth 2 (two) times daily.    Dispense:  60 tablet    Refill:  3      Procedures: Large Joint Inj Date/Time: 03/15/2016 3:17 PM Performed by: Pete Pelt Authorized by: Pete Pelt   Consent Given by:  Patient Indications:  Pain Location:  Knee Site:  L knee Needle Size:  22 G Approach:  Anterolateral Ultrasound Guidance: No   Fluoroscopic Guidance: No   Medications:  40 mg methylPREDNISolone acetate 40 MG/ML; 3 mL lidocaine 1 % Aspiration Attempted: No   Patient tolerance:  Patient tolerated the procedure well with no immediate complications Large Joint Inj Date/Time: 03/15/2016 3:17 PM Performed by: Pete Pelt Authorized by: Pete Pelt   Consent Given by:  Patient Indications:  Pain Location:  Knee Site:  R knee Needle Size:  22 G Approach:  Anterolateral Ultrasound Guidance: No   Fluoroscopic Guidance: No   Medications:  3 mL lidocaine 1 %; 40 mg methylPREDNISolone acetate 40 MG/ML Aspiration Attempted: No   Patient tolerance:  Patient tolerated the procedure well with no immediate  complications     Clinical Data: No additional findings.   Subjective: Chief Complaint  Patient presents with  . Right Knee - Pain  . Left Knee - Pain    Kayla Larsen is here for bilateral knee pain.  She had a right knee injection on 12/13/2015 and states that it did good for and noticed the pain returning last week.  She states that her Left knee is painful posteriorly after she has worked and come home and slept for 6 hours and then she can't straighten it out.  She is requesting injections in both knees today.  She has no giving way of the left knee no real catching or locking. She has popping that she states is somewhat history relieves the pain in the knee. He relates her right knee increased pain to the fact that she's had to work on hard concrete floors.  Review of Systems   Objective: Vital Signs: There were no vitals taken for this visit.  Physical Exam  Ortho Exam Bowel knee she has full extension. Flexion of the right knee to 110 left knee 105. No instability with either knee. No effusion abnormal warmth of either knee she has negative McMurray's bilaterally. The posterior medial aspect of the left knee.  Specialty Comments:  No specialty comments available.  Imaging: No results found.   PMFS History: Patient Active  Problem List   Diagnosis Date Noted  . S/P right knee arthroscopy 12/13/2015  . Dry skin 07/15/2015  . Elevated LFTs 07/15/2015  . Acanthosis nigricans 05/03/2015  . Obesity 05/03/2015  . Overactive bladder 05/03/2015  . Essential hypertension 05/03/2015  . Routine general medical examination at a health care facility 05/03/2015  . Arthritis, senescent 05/03/2015  . History of anemia 05/03/2015  . Hypertriglyceridemia 05/03/2015  . Impaired fasting blood sugar 05/03/2015  . Goiter 05/03/2015  . Pelvic mass in female 07/31/2012   Past Medical History:  Diagnosis Date  . Acanthosis nigricans   . Anemia   . Arthritis of knee   .  External hemorrhoid   . History of blood transfusion 2009   2 units after hysterectomy  . Hypertension   . Obesity   . Sleep apnea 2010   borderline no cpap used  . Wears glasses     Family History  Problem Relation Age of Onset  . Cancer Mother     liver and lung  . Hypertension Mother   . Diabetes Father   . Hypertension Brother   . Hypertension Brother   . Hypertension Brother   . Cancer Maternal Aunt     thyroid  . Diabetes Maternal Aunt   . Hypertension Maternal Aunt   . Heart disease Maternal Grandmother   . Diabetes Maternal Uncle   . Hypertension Maternal Uncle   . Stroke Neg Hx     Past Surgical History:  Procedure Laterality Date  . ABDOMINAL HYSTERECTOMY  2009  . CESAREAN SECTION     x 2  . COLONOSCOPY  2009   done for anemia eval; Dr. Benson Norway  . LAPAROTOMY Left 07/30/2012   Procedure: LEFT SALPINGO OOPHERECTOMY,  OMENTECTOMY, AND LYSIS OF ADHESIONS;  Surgeon: Alvino Chapel, MD;  Location: [facility];  Service: Gynecology;  Laterality: Left;  . OOPHORECTOMY  2014   left   . removed a cyst on her ovaries  2014   Social History   Occupational History  . Not on file.   Social History Main Topics  . Smoking status: Former Smoker    Packs/day: 0.25    Years: 4.00    Types: Cigarettes    Quit date: 09/29/2011  . Smokeless tobacco: Never Used  . Alcohol use 1.2 oz/week    2 Shots of liquor per week     Comment: occassional  . Drug use: No  . Sexual activity: No

## 2016-03-27 ENCOUNTER — Ambulatory Visit (INDEPENDENT_AMBULATORY_CARE_PROVIDER_SITE_OTHER): Payer: BLUE CROSS/BLUE SHIELD | Admitting: Physician Assistant

## 2016-03-29 ENCOUNTER — Ambulatory Visit (INDEPENDENT_AMBULATORY_CARE_PROVIDER_SITE_OTHER): Payer: BLUE CROSS/BLUE SHIELD | Admitting: Physician Assistant

## 2016-03-29 ENCOUNTER — Ambulatory Visit (INDEPENDENT_AMBULATORY_CARE_PROVIDER_SITE_OTHER): Payer: BLUE CROSS/BLUE SHIELD

## 2016-03-29 ENCOUNTER — Encounter (INDEPENDENT_AMBULATORY_CARE_PROVIDER_SITE_OTHER): Payer: Self-pay | Admitting: Physician Assistant

## 2016-03-29 DIAGNOSIS — M25562 Pain in left knee: Secondary | ICD-10-CM | POA: Diagnosis not present

## 2016-03-29 DIAGNOSIS — M1712 Unilateral primary osteoarthritis, left knee: Secondary | ICD-10-CM | POA: Diagnosis not present

## 2016-03-29 NOTE — Progress Notes (Signed)
Office Visit Note   Patient: Kayla Larsen           Date of Birth: 10-Feb-1970           MRN: [id number] Visit Date: 03/29/2016              Requested by: Carlena Hurl, PA-C [address] [place], [place] [address] PCP: Crisoforo Oxford, PA-C   Assessment & Plan: Visit Diagnoses:  1. Acute pain of left knee   2. Primary osteoarthritis of left knee     Plan: Due to the fact that she's gotten good results with conservative treatment by a NSAIDs and cortisone injections I encouraged her to continue with these conservative measures. May even include supplemental injection in the future. [place] to keep her weight down angled and continue to work on quad strengthening of her knees. She'll follow with [place] on an as-needed basis.  Follow-Up Instructions: Return if symptoms worsen or fail to improve.   Orders:  Orders Placed This Encounter  Procedures  . XR Knee 1-2 Views Left   No orders of the defined types were placed in this encounter.     Procedures: No procedures performed   Clinical Data: No additional findings.   Subjective: Chief Complaint  Patient presents with  . Left Knee - Pain  . Right Knee - Pain    Patient returns for two week follow up. She is status post bilateral knee injections on 03/15/2016. She states that the injections helped a lot. She was doing great until a bird got into her house 3 days ago and she ran from it. She states she now feels pressure on her left knee. She takes diclofenac and tylenol with relief. She also brought a handicap placard to be filled out and renewed.     Review of Systems   Objective: Vital Signs: There were no vitals taken for this visit.  Physical Exam  Ortho Exam Left knee with good range of motion. Market crepitus with range of motion. No instability valgus varus stressing. No abnormal warmth erythema or effusion of the left knee Specialty Comments:  No specialty comments available.  Imaging: Xr  Knee 1-2 Views Left  Result Date: 03/29/2016 AP and Lateral left knee: No acute fracture. Bone-on-bone arthritis medial compartment. Severe patellofemoral arthritic changes. Mild lateral compartmental changes with chondrocalcinosis of the lateral meniscus.    PMFS History: Patient Active Problem List   Diagnosis Date Noted  . S/P right knee arthroscopy 12/13/2015  . Dry skin 07/15/2015  . Elevated LFTs 07/15/2015  . Acanthosis nigricans 05/03/2015  . Obesity 05/03/2015  . Overactive bladder 05/03/2015  . Essential hypertension 05/03/2015  . Routine general medical examination at a health care facility 05/03/2015  . Arthritis, senescent 05/03/2015  . History of anemia 05/03/2015  . Hypertriglyceridemia 05/03/2015  . Impaired fasting blood sugar 05/03/2015  . Goiter 05/03/2015  . Pelvic mass in female 07/31/2012   Past Medical History:  Diagnosis Date  . Acanthosis nigricans   . Anemia   . Arthritis of knee   . External hemorrhoid   . History of blood transfusion 2009   2 units after hysterectomy  . Hypertension   . Obesity   . Sleep apnea 2010   borderline no cpap used  . Wears glasses     Family History  Problem Relation Age of Onset  . Cancer Mother     liver and lung  . Hypertension Mother   . Diabetes Father   .  Hypertension Brother   . Hypertension Brother   . Hypertension Brother   . Cancer Maternal Aunt     thyroid  . Diabetes Maternal Aunt   . Hypertension Maternal Aunt   . Heart disease Maternal Grandmother   . Diabetes Maternal Uncle   . Hypertension Maternal Uncle   . Stroke Neg Hx     Past Surgical History:  Procedure Laterality Date  . ABDOMINAL HYSTERECTOMY  2009  . CESAREAN SECTION     x 2  . COLONOSCOPY  2009   done for anemia eval; Dr. Benson Norway  . LAPAROTOMY Left 07/30/2012   Procedure: LEFT SALPINGO OOPHERECTOMY,  OMENTECTOMY, AND LYSIS OF ADHESIONS;  Surgeon: Alvino Chapel, MD;  Location: [facility];  Service: Gynecology;  Laterality:  Left;  . OOPHORECTOMY  2014   left   . removed a cyst on her ovaries  2014   Social History   Occupational History  . Not on file.   Social History Main Topics  . Smoking status: Former Smoker    Packs/day: 0.25    Years: 4.00    Types: Cigarettes    Quit date: 09/29/2011  . Smokeless tobacco: Never Used  . Alcohol use 1.2 oz/week    2 Shots of liquor per week     Comment: occassional  . Drug use: No  . Sexual activity: No

## 2016-05-02 ENCOUNTER — Other Ambulatory Visit: Payer: Self-pay | Admitting: Medical

## 2016-05-02 ENCOUNTER — Telehealth: Payer: Self-pay | Admitting: Internal Medicine

## 2016-05-02 NOTE — Telephone Encounter (Signed)
Pt called to state that her Bp was 106/64. She just started taking the Turmeric 2 weeks ago for the knee pain that ortho recommended. Pt has felt lightheaded some since taking the turmeric but pharmacist said sometimes turmeric can make your bp drop so patient just wanted to let you know and see if something she needed you to do

## 2016-05-02 NOTE — Telephone Encounter (Signed)
While on the Tumeric, lets try using 1/2 tablet Tribenzor daily unless its a capsule.     Monitor BPs, let me know in a few days how the BPs are looking

## 2016-05-03 NOTE — Telephone Encounter (Signed)
Called pt to let her know to take a 1/2 tab. Daily and  Will [place] back on Monday with b/p readings.

## 2016-05-19 ENCOUNTER — Other Ambulatory Visit: Payer: Self-pay | Admitting: Medical

## 2016-07-25 ENCOUNTER — Other Ambulatory Visit: Payer: Self-pay | Admitting: Medical

## 2016-07-25 DIAGNOSIS — N6320 Unspecified lump in the left breast, unspecified quadrant: Secondary | ICD-10-CM

## 2016-07-28 ENCOUNTER — Other Ambulatory Visit: Payer: BLUE CROSS/BLUE SHIELD

## 2016-08-03 ENCOUNTER — Other Ambulatory Visit: Payer: BLUE CROSS/BLUE SHIELD

## 2016-08-08 ENCOUNTER — Other Ambulatory Visit: Payer: BLUE CROSS/BLUE SHIELD

## 2016-08-20 ENCOUNTER — Other Ambulatory Visit: Payer: Self-pay | Admitting: Medical

## 2016-08-25 ENCOUNTER — Ambulatory Visit
Admission: RE | Admit: 2016-08-25 | Discharge: 2016-08-25 | Disposition: A | Discharge status: Home / Self Care | Payer: BLUE CROSS/BLUE SHIELD | Source: Ambulatory Visit | Attending: Medical | Admitting: Medical

## 2016-08-25 DIAGNOSIS — N6320 Unspecified lump in the left breast, unspecified quadrant: Secondary | ICD-10-CM

## 2016-09-18 ENCOUNTER — Other Ambulatory Visit: Payer: Self-pay | Admitting: Medical

## 2016-09-19 ENCOUNTER — Telehealth: Payer: Self-pay | Admitting: Medical

## 2016-09-19 NOTE — Telephone Encounter (Signed)
Get in for physical 

## 2016-09-21 NOTE — Telephone Encounter (Signed)
Pt is coming in on sept the 26th

## 2016-10-03 ENCOUNTER — Encounter: Payer: Self-pay | Admitting: Obstetrics

## 2016-10-03 ENCOUNTER — Ambulatory Visit (INDEPENDENT_AMBULATORY_CARE_PROVIDER_SITE_OTHER): Payer: BLUE CROSS/BLUE SHIELD | Admitting: Obstetrics

## 2016-10-03 VITALS — BP 152/85 | HR 87 | Wt 244.2 lb

## 2016-10-03 DIAGNOSIS — N898 Other specified noninflammatory disorders of vagina: Secondary | ICD-10-CM

## 2016-10-03 DIAGNOSIS — R350 Frequency of micturition: Secondary | ICD-10-CM | POA: Diagnosis not present

## 2016-10-03 DIAGNOSIS — M545 Low back pain, unspecified: Secondary | ICD-10-CM

## 2016-10-03 DIAGNOSIS — Z01419 Encounter for gynecological examination (general) (routine) without abnormal findings: Secondary | ICD-10-CM

## 2016-10-03 DIAGNOSIS — Z90710 Acquired absence of both cervix and uterus: Secondary | ICD-10-CM

## 2016-10-03 LAB — POCT URINALYSIS DIPSTICK
BILIRUBIN UA: NEGATIVE
Blood, UA: NEGATIVE
GLUCOSE UA: NEGATIVE
KETONES UA: NEGATIVE
Leukocytes, UA: NEGATIVE
Nitrite, UA: NEGATIVE
SPEC GRAV UA: 1.01 (ref 1.010–1.025)
Urobilinogen, UA: 0.2 E.U./dL
pH, UA: 5 (ref 5.0–8.0)

## 2016-10-03 MED ORDER — CYCLOBENZAPRINE HCL 10 MG PO TABS: 5.0000 mg | ORAL_TABLET | Freq: Three times a day (TID) | ORAL | 2 refills | Status: DC | PRN

## 2016-10-03 NOTE — Progress Notes (Addendum)
Subjective:        Kayla Larsen is a 46 y.o. female here for a routine exam.  Current complaints: Backache from lifting box.    Personal health questionnaire:  Is patient Ashkenazi Jewish, have a family history of breast and/or ovarian cancer: no Is there a family history of uterine cancer diagnosed at age < 45, gastrointestinal cancer, urinary tract cancer, family member who is a Field seismologist syndrome-associated carrier: no Is the patient overweight and hypertensive, family history of diabetes, personal history of gestational diabetes, preeclampsia or PCOS: no Is patient over 36, have PCOS,  family history of premature CHD under age 21, diabetes, smoke, have hypertension or peripheral artery disease:  no At any time, has a partner hit, kicked or otherwise hurt or frightened you?: no Over the past 2 weeks, have you felt down, depressed or hopeless?: no Over the past 2 weeks, have you felt little interest or pleasure in doing things?:no   Gynecologic History No LMP recorded. Patient has had a hysterectomy. Contraception: status post hysterectomy Last Pap: ~ 2013. Results were: normal Last mammogram: 2018. Results were: normal  Obstetric History OB History  Gravida Para Term Preterm AB Living  2         2  SAB TAB Ectopic Multiple Live Births               # Outcome Date GA Lbr Len/2nd Weight Sex Delivery Anes PTL Lv  2 Gravida           1 Gravida               Past Medical History:  Diagnosis Date  . Acanthosis nigricans   . Anemia   . Arthritis of knee   . External hemorrhoid   . History of blood transfusion 2009   2 units after hysterectomy  . Hypertension   . Obesity   . Sleep apnea 2010   borderline no cpap used  . Wears glasses     Past Surgical History:  Procedure Laterality Date  . ABDOMINAL HYSTERECTOMY  2009  . CESAREAN SECTION     x 2  . COLONOSCOPY  2009   done for anemia eval; Dr. Benson Norway  . KNEE SURGERY  10/2015   knot removed from R knee  .  LAPAROTOMY Left 07/30/2012   Procedure: LEFT SALPINGO OOPHERECTOMY,  OMENTECTOMY, AND LYSIS OF ADHESIONS;  Surgeon: Alvino Chapel, MD;  Location: [facility];  Service: Gynecology;  Laterality: Left;  . OOPHORECTOMY  2014   left   . removed a cyst on her ovaries  2014     Current Outpatient Prescriptions:  .  diclofenac sodium (VOLTAREN) 1 % GEL, Apply topically 4 (four) times daily. Reported on 05/03/2015, Disp: , Rfl:  .  ferrous sulfate 325 (65 FE) MG tablet, Take 325 mg by mouth daily with breakfast., Disp: , Rfl:  .  Multiple Vitamin (MULTIVITAMIN WITH MINERALS) TABS, Take 1 tablet by mouth daily., Disp: , Rfl:  .  Olmesartan-Amlodipine-HCTZ 40-5-12.5 MG TABS, Take 1 tablet by mouth daily., Disp: 90 tablet, Rfl: 3 .  tolterodine (DETROL LA) 4 MG 24 hr capsule, TAKE 1 CAPSULE BY MOUTH DAILY, Disp: 90 capsule, Rfl: 1 .  TURMERIC PO, Take by mouth., Disp: , Rfl:  .  cyclobenzaprine (FLEXERIL) 10 MG tablet, Take 0.5-1 tablets (5-10 mg total) by mouth every 8 (eight) hours as needed for muscle spasms., Disp: 30 tablet, Rfl: 2 .  diclofenac (VOLTAREN) 50 MG EC tablet,  Take 1 tablet (50 mg total) by mouth 2 (two) times daily. (Patient not taking: Reported on 10/03/2016), Disp: 60 tablet, Rfl: 3 Allergies  Allergen Reactions  . Amlodipine     Ankle swelling at 10mg   . Lisinopril     Cough     Social History  Substance Use Topics  . Smoking status: Former Smoker    Packs/day: 0.25    Years: 4.00    Types: Cigarettes    Quit date: 09/29/2011  . Smokeless tobacco: Never Used  . Alcohol use No    Family History  Problem Relation Age of Onset  . Cancer Mother        liver and lung  . Hypertension Mother   . Diabetes Father   . Kidney failure Father   . Hypertension Brother   . Hypertension Brother   . Hypertension Brother   . Cancer Maternal Aunt        thyroid  . Diabetes Maternal Aunt   . Hypertension Maternal Aunt   . Heart disease Maternal Grandmother   . Diabetes  Maternal Uncle   . Hypertension Maternal Uncle   . Stroke Neg Hx       Review of Systems  Constitutional: negative for fatigue and weight loss Respiratory: negative for cough and wheezing Cardiovascular: negative for chest pain, fatigue and palpitations Gastrointestinal: negative for abdominal pain and change in bowel habits Musculoskeletal:positive for myalgias - Backache Neurological: negative for gait problems and tremors Behavioral/Psych: negative for abusive relationship, depression Endocrine: negative for temperature intolerance    Genitourinary:negative for abnormal menstrual periods, genital lesions, hot flashes, sexual problems and vaginal discharge Integument/breast: negative for breast lump, breast tenderness, nipple discharge and skin lesion(s)    Objective:       BP (!) 152/85   Pulse 87   Wt 244 lb 3.2 oz (110.8 kg)   BMI 41.92 kg/m  General:   alert  Skin:   no rash or abnormalities  Lungs:   clear to auscultation bilaterally  Heart:   regular rate and rhythm, S1, S2 normal, no murmur, click, rub or gallop  Breasts:   normal without suspicious masses, skin or nipple changes or axillary nodes  Abdomen:  normal findings: no organomegaly, soft, non-tender and no hernia  Pelvis:  External genitalia: normal general appearance Urinary system: urethral meatus normal and bladder without fullness, nontender Vaginal: normal without tenderness, induration or masses Cervix: absent Adnexa: normal bimanual exam Uterus: absent   Lab Review Urine pregnancy test Labs reviewed yes Radiologic studies reviewed yes  50% of 20 min visit spent on counseling and coordination of care.    Assessment:     1. Encounter for gynecological examination  2. S/P TAH (total abdominal hysterectomy) / LSO ( left salpingo-oophorectomy ) for fibroids - doing well  3. Vaginal discharge Rx: - Cervicovaginal ancillary only  4. Urinary frequency Rx: - POCT Urinalysis Dipstick:   WNL's  5. Acute left-sided low back pain without sciatica Rx: - cyclobenzaprine (FLEXERIL) 10 MG tablet; Take 0.5-1 tablets (5-10 mg total) by mouth every 8 (eight) hours as needed for muscle spasms.  Dispense: 30 tablet; Refill: 2 - Naprosyn Rx by PCP   Plan:    Education reviewed: calcium supplements, depression evaluation, low fat, low cholesterol diet, self breast exams and weight bearing exercise. Follow up in: 3 years.   Meds ordered this encounter  Medications  . ferrous sulfate 325 (65 FE) MG tablet    Sig: Take 325 mg by mouth  daily with breakfast.  . TURMERIC PO    Sig: Take by mouth.  . cyclobenzaprine (FLEXERIL) 10 MG tablet    Sig: Take 0.5-1 tablets (5-10 mg total) by mouth every 8 (eight) hours as needed for muscle spasms.    Dispense:  30 tablet    Refill:  2   Orders Placed This Encounter  Procedures  . POCT Urinalysis Dipstick

## 2016-10-03 NOTE — Progress Notes (Signed)
Patient is in the office today for annual exam- patient thinks she has pulled a muscle in her back.

## 2016-10-04 ENCOUNTER — Ambulatory Visit (INDEPENDENT_AMBULATORY_CARE_PROVIDER_SITE_OTHER): Payer: BLUE CROSS/BLUE SHIELD | Admitting: Medical

## 2016-10-04 ENCOUNTER — Encounter: Payer: Self-pay | Admitting: Medical

## 2016-10-04 VITALS — BP 138/80 | HR 81 | Ht 64.0 in | Wt 245.2 lb

## 2016-10-04 DIAGNOSIS — E781 Pure hyperglyceridemia: Secondary | ICD-10-CM | POA: Diagnosis not present

## 2016-10-04 DIAGNOSIS — M25512 Pain in left shoulder: Secondary | ICD-10-CM | POA: Insufficient documentation

## 2016-10-04 DIAGNOSIS — N3281 Overactive bladder: Secondary | ICD-10-CM

## 2016-10-04 DIAGNOSIS — R7301 Impaired fasting glucose: Secondary | ICD-10-CM | POA: Diagnosis not present

## 2016-10-04 DIAGNOSIS — E669 Obesity, unspecified: Secondary | ICD-10-CM | POA: Diagnosis not present

## 2016-10-04 DIAGNOSIS — E049 Nontoxic goiter, unspecified: Secondary | ICD-10-CM

## 2016-10-04 DIAGNOSIS — Z0000 Encounter for general adult medical examination without abnormal findings: Secondary | ICD-10-CM

## 2016-10-04 DIAGNOSIS — I10 Essential (primary) hypertension: Secondary | ICD-10-CM | POA: Diagnosis not present

## 2016-10-04 DIAGNOSIS — Z862 Personal history of diseases of the blood and blood-forming organs and certain disorders involving the immune mechanism: Secondary | ICD-10-CM | POA: Diagnosis not present

## 2016-10-04 LAB — POCT URINALYSIS DIP (PROADVANTAGE DEVICE)
BILIRUBIN UA: NEGATIVE mg/dL
Bilirubin, UA: NEGATIVE
Glucose, UA: NEGATIVE mg/dL
LEUKOCYTES UA: NEGATIVE
Nitrite, UA: NEGATIVE
PH UA: 6 (ref 5.0–8.0)
PROTEIN UA: NEGATIVE mg/dL
RBC UA: NEGATIVE
Specific Gravity, Urine: 1.03
Urobilinogen, Ur: NEGATIVE

## 2016-10-04 LAB — CERVICOVAGINAL ANCILLARY ONLY
BACTERIAL VAGINITIS: NEGATIVE
CANDIDA VAGINITIS: NEGATIVE

## 2016-10-04 MED ORDER — OLMESARTAN-AMLODIPINE-HCTZ 40-5-12.5 MG PO TABS: 1.0000 | ORAL_TABLET | Freq: Every day | ORAL | 3 refills | Status: DC

## 2016-10-04 MED ORDER — METHOCARBAMOL 500 MG PO TABS: 500.0000 mg | ORAL_TABLET | Freq: Every day | ORAL | 0 refills | Status: DC

## 2016-10-04 MED ORDER — TOLTERODINE TARTRATE ER 4 MG PO CP24: 4.0000 mg | ORAL_CAPSULE | Freq: Every day | ORAL | 3 refills | Status: DC

## 2016-10-04 NOTE — Progress Notes (Signed)
Subjective:   HPI  Kayla Larsen is a 46 y.o. female who presents for a complete physical.  Medical care team includes:  Dr. Baltazar Najjar for gynecology  Dr. Ninfa Linden, orthopedics regarding right knee  Dr. Burt Knack, dentist  Dr. Gershon Crane, ophthalmology [name], PA-C here for primary care   Concerns: Got flu shot at [facility]  Father passed away last month with CKD and pneumonia  hypertension - compliant with medication, normal readings at home  Doing ok on detrol for OAB  Saw gyn yesterday for pap  Has f/u mammogram 11/2016  Had recent back strain and left shoulder pain after lifting some things.  Gyn gave flexeril yesterday but this knocks her out.  Would like something less sedating.  Reviewed their medical, surgical, family, social, medication, and allergy history and updated chart as appropriate.  Past Medical History:  Diagnosis Date  . Acanthosis nigricans   . Anemia   . Arthritis of knee   . External hemorrhoid   . History of blood transfusion 2009   2 units after hysterectomy  . Hypertension   . Obesity   . Sleep apnea 2010   borderline no cpap used  . Wears glasses     Past Surgical History:  Procedure Laterality Date  . ABDOMINAL HYSTERECTOMY  2009  . CESAREAN SECTION     x 2  . COLONOSCOPY  2009   done for anemia eval; Dr. Benson Norway  . KNEE SURGERY  10/2015   knot removed from R knee  . LAPAROTOMY Left 07/30/2012   Procedure: LEFT SALPINGO OOPHERECTOMY,  OMENTECTOMY, AND LYSIS OF ADHESIONS;  Surgeon: Alvino Chapel, MD;  Location: [facility];  Service: Gynecology;  Laterality: Left;  . OOPHORECTOMY  2014   left   . removed a cyst on her ovaries  2014    Social History   Social History  . Marital status: Single    Spouse name: N/A  . Number of children: N/A  . Years of education: N/A   Occupational History  . Not on file.   Social History Main Topics  . Smoking status: Former Smoker    Packs/day: 0.25    Years: 4.00     Types: Cigarettes    Quit date: 09/29/2011  . Smokeless tobacco: Never Used  . Alcohol use No  . Drug use: No  . Sexual activity: No   Other Topics Concern  . Not on file   Social History Narrative   Single, works as an Agricultural consultant in Teacher, adult education.  Goes to the gym some.  Works 3rd shift    Family History  Problem Relation Age of Onset  . Cancer Mother        liver and lung  . Hypertension Mother   . Diabetes Father   . Kidney failure Father   . Hypertension Brother   . Hypertension Brother   . Hypertension Brother   . Cancer Maternal Aunt        thyroid  . Diabetes Maternal Aunt   . Hypertension Maternal Aunt   . Heart disease Maternal Grandmother   . Diabetes Maternal Uncle   . Hypertension Maternal Uncle   . Stroke Neg Hx      Current Outpatient Prescriptions:  .  ferrous sulfate 325 (65 FE) MG tablet, Take 325 mg by mouth daily with breakfast., Disp: , Rfl:  .  Multiple Vitamin (MULTIVITAMIN WITH MINERALS) TABS, Take 1 tablet by mouth daily., Disp: , Rfl:  .  Olmesartan-Amlodipine-HCTZ 40-5-12.5  MG TABS, Take 1 tablet by mouth daily., Disp: 90 tablet, Rfl: 3 .  tolterodine (DETROL LA) 4 MG 24 hr capsule, Take 1 capsule (4 mg total) by mouth daily., Disp: 90 capsule, Rfl: 3 .  TURMERIC PO, Take by mouth., Disp: , Rfl:  .  VIMOVO 500-20 MG TBEC, Take 1 tablet by mouth 2 (two) times daily., Disp: , Rfl: 3 .  diclofenac sodium (VOLTAREN) 1 % GEL, Apply topically 4 (four) times daily. Reported on 05/03/2015, Disp: , Rfl:  .  methocarbamol (ROBAXIN) 500 MG tablet, Take 1 tablet (500 mg total) by mouth at bedtime., Disp: 15 tablet, Rfl: 0  Allergies  Allergen Reactions  . Amlodipine     Ankle swelling at 10mg   . Lisinopril     Cough     Review of Systems Constitutional: -fever, -chills, -sweats, -unexpected weight change, -decreased appetite, -fatigue Allergy: -sneezing, -itching, -congestion Dermatology: -changing moles, --rash, -lumps ENT: -runny nose, -ear  pain, -sore throat, -hoarseness, -sinus pain, -teeth pain, - ringing in ears, -hearing loss, -nosebleeds Cardiology: -chest pain, -palpitations, -swelling, -difficulty breathing when lying flat, -waking up short of breath Respiratory: -cough, -shortness of breath, -difficulty breathing with exercise or exertion, -wheezing, -coughing up blood Gastroenterology: -abdominal pain, -nausea, -vomiting, -diarrhea, -constipation, -blood in stool, -changes in bowel movement, -difficulty swallowing or eating Hematology: -bleeding, -bruising  Musculoskeletal: +joint aches, -muscle aches, -joint swelling, -back pain, -neck pain, -cramping, -changes in gait Ophthalmology: denies vision changes, eye redness, itching, discharge Urology: -burning with urination, -difficulty urinating, -blood in urine, +urinary frequency, -urgency, -incontinence Neurology: -headache, -weakness, -tingling, -numbness, -memory loss, -falls, -dizziness Psychology: -depressed mood, -agitation, -sleep problems     Objective:   Physical Exam  BP 138/80   Pulse 81   Ht 5\' 4"  (1.626 m)   Wt 245 lb 3.2 oz (111.2 kg)   SpO2 96%   BMI 42.09 kg/m   BP Readings from Last 3 Encounters:  10/04/16 138/80  10/03/16 (!) 152/85  11/25/15 (!) 150/92   Wt Readings from Last 3 Encounters:  10/04/16 245 lb 3.2 oz (111.2 kg)  10/03/16 244 lb 3.2 oz (110.8 kg)  11/25/15 236 lb 3.2 oz (107.1 kg)   General appearance: alert, no distress, WD/WN, obese AA female Skin: darkened skin around neck perimeter with several skin tags present, suggestive of acanthosis nigricans, few scattered macules, no worrisome lesions, tattoos right forearm, left dorsal wrist, right lateral lower leg, left lower lateral leg, burns scar of left volar forearm HEENT: normocephalic, conjunctiva/corneas normal, sclerae anicteric, PERRLA, EOMi, nares patent, no discharge or erythema, pharynx normal Oral cavity: MMM, tongue normal, teeth in good repair Neck: supple, no  lymphadenopathy, no thyromegaly, no masses, normal ROM, no bruits Chest: non tender, normal shape and expansion Heart: RRR, normal S1, S2, no murmurs Lungs: CTA bilaterally, no wheezes, rhonchi, or rales Abdomen: +bs, soft, +striae, non tender, non distended, no masses, no hepatomegaly, no splenomegaly, no bruits Back: non tender, normal ROM, no scoliosis Musculoskeletal: mild tenderness left AC joint ,but no swelling, normal left shoulder ROM, otherwise upper extremities non tender, no obvious deformity, normal ROM throughout, lower extremities non tender, no obvious deformity, normal ROM throughout Extremities:1+ bilat ankle nonpitting edema, no cyanosis, no clubbing Pulses: 1+ symmetric, upper and lower extremities, normal cap refill Neurological: alert, oriented x 3, CN2-12 intact, strength normal upper extremities and lower extremities, sensation normal throughout, DTRs 2+ throughout, no cerebellar signs, gait normal Psychiatric: normal affect, behavior normal, pleasant  Breast/gyn/rectal - deferred to gynecology  Adult ECG Report  Indication: HTN  Rate: 73 bpm  Rhythm: normal sinus rhythm  QRS Axis: 45 degrees  PR Interval: 142ms  QRS Duration: 19ms  QTc: 474ms  Conduction Disturbances: none  Other Abnormalities: none  Patient's cardiac risk factors are: hypertension.  EKG comparison: 2016   Narrative Interpretation: no acute changes     Assessment and Plan :    Encounter Diagnoses  Name Primary?  . Routine general medical examination at a health care facility Yes  . Essential hypertension   . Impaired fasting blood sugar   . Goiter   . Overactive bladder   . History of anemia   . Hypertriglyceridemia   . Class 1 obesity with serious comorbidity in adult, unspecified BMI, unspecified obesity type   . Arthralgia of left acromioclavicular joint     Physical exam - discussed healthy lifestyle, diet, exercise, preventative care, vaccinations, and addressed their  concerns.   Expressed sympathy for her loss Routine labs today See your eye doctor yearly for routine vision care. See your dentist yearly for routine dental care including hygiene visits twice yearly. See your gynecologist yearly for routine gynecological care. Flu shot updated at work recently Obesity - c/t efforts at weight loss with healthy lifestyle changes as discussed.   Anemia - labs today Overactive bladder - doing fine on current medication HTN -c/t current medication. Shoulder pain, back strain - change to robaxin for less sedating option, discussed proper use of medicaiton, OTC NSAID short term, relative rest.    Follow-up pending labs  Saysha was seen today for physical.  Diagnoses and all orders for this visit:  Routine general medical examination at a health care facility -     POCT Urinalysis DIP (Proadvantage Device) -     Comprehensive metabolic panel -     Lipid panel -     CBC -     Hemoglobin A1c -     EKG 12-Lead -     VITAMIN D 25 Hydroxy (Vit-D Deficiency, Fractures) -     Iron  Essential hypertension -     Comprehensive metabolic panel -     Lipid panel -     Hemoglobin A1c -     EKG 12-Lead  Impaired fasting blood sugar  Goiter  Overactive bladder  History of anemia -     CBC -     Iron  Hypertriglyceridemia -     Lipid panel  Class 1 obesity with serious comorbidity in adult, unspecified BMI, unspecified obesity type  Arthralgia of left acromioclavicular joint  Other orders -     methocarbamol (ROBAXIN) 500 MG tablet; Take 1 tablet (500 mg total) by mouth at bedtime. -     tolterodine (DETROL LA) 4 MG 24 hr capsule; Take 1 capsule (4 mg total) by mouth daily. -     Olmesartan-Amlodipine-HCTZ 40-5-12.5 MG TABS; Take 1 tablet by mouth daily.

## 2016-10-05 ENCOUNTER — Other Ambulatory Visit: Payer: Self-pay | Admitting: Medical

## 2016-10-05 LAB — COMPREHENSIVE METABOLIC PANEL
AG Ratio: 1.6 (calc) (ref 1.0–2.5)
ALBUMIN MSPROF: 4.2 g/dL (ref 3.6–5.1)
ALT: 20 U/L (ref 6–29)
AST: 13 U/L (ref 10–35)
Alkaline phosphatase (APISO): 51 U/L (ref 33–115)
BUN: 21 mg/dL (ref 7–25)
CHLORIDE: 103 mmol/L (ref 98–110)
CO2: 26 mmol/L (ref 20–32)
CREATININE: 0.78 mg/dL (ref 0.50–1.10)
Calcium: 9.9 mg/dL (ref 8.6–10.2)
GLOBULIN: 2.7 g/dL (ref 1.9–3.7)
Glucose, Bld: 119 mg/dL — ABNORMAL HIGH (ref 65–99)
POTASSIUM: 4.1 mmol/L (ref 3.5–5.3)
SODIUM: 139 mmol/L (ref 135–146)
TOTAL PROTEIN: 6.9 g/dL (ref 6.1–8.1)
Total Bilirubin: 0.3 mg/dL (ref 0.2–1.2)

## 2016-10-05 LAB — CBC
HCT: 32.4 % — ABNORMAL LOW (ref 35.0–45.0)
Hemoglobin: 10.4 g/dL — ABNORMAL LOW (ref 11.7–15.5)
MCH: 23 pg — AB (ref 27.0–33.0)
MCHC: 32.1 g/dL (ref 32.0–36.0)
MCV: 71.5 fL — AB (ref 80.0–100.0)
MPV: 9.7 fL (ref 7.5–12.5)
PLATELETS: 399 10*3/uL (ref 140–400)
RBC: 4.53 10*6/uL (ref 3.80–5.10)
RDW: 16.1 % — AB (ref 11.0–15.0)
WBC: 7 10*3/uL (ref 3.8–10.8)

## 2016-10-05 LAB — LIPID PANEL
CHOL/HDL RATIO: 3.3 (calc) (ref <5.0)
Cholesterol: 183 mg/dL (ref <200)
HDL: 55 mg/dL (ref >50)
LDL CHOLESTEROL (CALC): 95 mg/dL
NON-HDL CHOLESTEROL (CALC): 128 mg/dL (ref <130)
Triglycerides: 250 mg/dL — ABNORMAL HIGH (ref <150)

## 2016-10-05 LAB — VITAMIN D 25 HYDROXY (VIT D DEFICIENCY, FRACTURES): Vit D, 25-Hydroxy: 26 ng/mL — ABNORMAL LOW (ref 30–100)

## 2016-10-05 LAB — HEMOGLOBIN A1C
HEMOGLOBIN A1C: 5.9 %{Hb} — AB (ref <5.7)
MEAN PLASMA GLUCOSE: 123 (calc)
eAG (mmol/L): 6.8 (calc)

## 2016-10-05 LAB — IRON: Iron: 87 ug/dL (ref 40–190)

## 2016-10-05 MED ORDER — VITAMIN D 1000 UNITS PO TABS: 1000.0000 [IU] | ORAL_TABLET | Freq: Every day | ORAL | 3 refills | Status: DC

## 2016-10-12 ENCOUNTER — Ambulatory Visit (INDEPENDENT_AMBULATORY_CARE_PROVIDER_SITE_OTHER): Payer: BLUE CROSS/BLUE SHIELD | Admitting: Physician Assistant

## 2016-10-12 DIAGNOSIS — M1711 Unilateral primary osteoarthritis, right knee: Secondary | ICD-10-CM | POA: Diagnosis not present

## 2016-10-12 DIAGNOSIS — M1712 Unilateral primary osteoarthritis, left knee: Secondary | ICD-10-CM

## 2016-10-12 DIAGNOSIS — M170 Bilateral primary osteoarthritis of knee: Secondary | ICD-10-CM

## 2016-10-12 MED ORDER — LIDOCAINE HCL 1 % IJ SOLN
3.0000 mL | INTRAMUSCULAR | Status: AC | PRN
  Administered 2016-10-12: 3 mL

## 2016-10-12 MED ORDER — METHYLPREDNISOLONE ACETATE 40 MG/ML IJ SUSP
40.0000 mg | INTRAMUSCULAR | Status: AC | PRN
  Administered 2016-10-12: 40 mg via INTRA_ARTICULAR

## 2016-10-12 NOTE — Progress Notes (Signed)
   Procedure Note  Patient: Kayla Larsen             Date of Birth: 10-14-1970           MRN: [id number]             Visit Date: 10/12/2016  Procedures: Visit Diagnoses: No diagnosis found.  No procedures performed

## 2016-10-12 NOTE — Progress Notes (Signed)
   Procedure Note  Patient: Kayla Larsen             Date of Birth: 1970/08/20           MRN: [id number]             Visit Date: 10/12/2016  History of present illness: Kayla Larsen returns today requesting injections in both knees. She has osteoarthritis of both knees. She's done well until recently and has developed some achiness and some popping in both knees. She remains on Tumeric and uses Voltaren gel on her knees. She feels both of these help. 7 no mechanical symptoms of either knee.  Physical exam: Bilateral knees good range of motion. No effusion abnormal warmth erythema. Well-healed port sites right knee from previous arthroscopy.  Procedures: Visit Diagnoses: Primary osteoarthritis of both knees  Large Joint Inj Date/Time: 10/12/2016 1:44 PM Performed by: Pete Pelt Authorized by: Pete Pelt   Consent Given by:  Patient Indications:  Pain Location:  Knee Site:  R knee Needle Size:  25 G Approach:  Anterolateral Ultrasound Guidance: No   Fluoroscopic Guidance: No   Medications:  40 mg methylPREDNISolone acetate 40 MG/ML; 3 mL lidocaine 1 % Aspiration Attempted: No   Patient tolerance:  Patient tolerated the procedure well with no immediate complications Large Joint Inj Date/Time: 10/12/2016 1:45 PM Performed by: Pete Pelt Authorized by: Pete Pelt   Consent Given by:  Patient Indications:  Pain Location:  Knee Site:  L knee Needle Size:  22 G Approach:  Anterolateral Ultrasound Guidance: No   Fluoroscopic Guidance: No   Medications:  40 mg methylPREDNISolone acetate 40 MG/ML; 3 mL lidocaine 1 % Aspiration Attempted: No   Patient tolerance:  Patient tolerated the procedure well with no immediate complications   Plan: She'll continue her Tumeric, diclofenac and Voltaren gel for her knees. Quad strengthening. Follow up as needed.

## 2016-10-20 ENCOUNTER — Other Ambulatory Visit: Payer: Self-pay | Admitting: Medical

## 2016-10-20 DIAGNOSIS — Z1231 Encounter for screening mammogram for malignant neoplasm of breast: Secondary | ICD-10-CM

## 2016-11-02 ENCOUNTER — Ambulatory Visit: Payer: Self-pay | Admitting: Medical

## 2016-11-08 ENCOUNTER — Other Ambulatory Visit (INDEPENDENT_AMBULATORY_CARE_PROVIDER_SITE_OTHER): Payer: Self-pay | Admitting: Physician Assistant

## 2016-11-09 ENCOUNTER — Ambulatory Visit: Payer: Self-pay | Admitting: Medical

## 2016-11-14 ENCOUNTER — Other Ambulatory Visit: Payer: Self-pay | Admitting: Medical

## 2016-11-14 DIAGNOSIS — N611 Abscess of the breast and nipple: Secondary | ICD-10-CM

## 2016-11-17 ENCOUNTER — Ambulatory Visit: Payer: BLUE CROSS/BLUE SHIELD | Admitting: Medical

## 2016-11-24 ENCOUNTER — Ambulatory Visit: Payer: BLUE CROSS/BLUE SHIELD | Admitting: Medical

## 2016-11-24 ENCOUNTER — Encounter: Payer: Self-pay | Admitting: Medical

## 2016-11-24 ENCOUNTER — Ambulatory Visit: Payer: BLUE CROSS/BLUE SHIELD

## 2016-11-24 VITALS — BP 128/80 | HR 76 | Wt 249.8 lb

## 2016-11-24 DIAGNOSIS — E781 Pure hyperglyceridemia: Secondary | ICD-10-CM

## 2016-11-24 DIAGNOSIS — E559 Vitamin D deficiency, unspecified: Secondary | ICD-10-CM | POA: Diagnosis not present

## 2016-11-24 DIAGNOSIS — L03317 Cellulitis of buttock: Secondary | ICD-10-CM

## 2016-11-24 DIAGNOSIS — D649 Anemia, unspecified: Secondary | ICD-10-CM

## 2016-11-24 MED ORDER — VITAMIN D 1000 UNITS PO TABS: 1000.0000 [IU] | ORAL_TABLET | Freq: Every day | ORAL | 3 refills | Status: DC

## 2016-11-24 MED ORDER — DOXYCYCLINE HYCLATE 100 MG PO TABS: 100.0000 mg | ORAL_TABLET | Freq: Two times a day (BID) | ORAL | 0 refills | Status: DC

## 2016-11-24 NOTE — Progress Notes (Signed)
Subjective: Chief Complaint  Patient presents with  . Follow-up    1 month follow up htn, cyst on back of her leg    Has some skin cysts popping up behind right upper leg and inside of left upper thigh.   They are kind of red ,painful.   Nothing draining.     Last visit for physical Vit D was low.  We advised she begin weekly prescription vitamin D.  She is compliant  Last visit we stressed importance of lifestyle changes to lose weight.  Since father died recently before last visit she has been stress eating, eating a lot of ice cream.  Plans to get back into regimen after Thanksgiving  Last visit triglycerides were elevated.  We referred back to GI for anemia and screening for colon cancer.  Hasn't heard from GI yet.  Past Medical History:  Diagnosis Date  . Acanthosis nigricans   . Anemia   . Arthritis of knee   . External hemorrhoid   . History of blood transfusion 2009   2 units after hysterectomy  . Hypertension   . Obesity   . Sleep apnea 2010   borderline no cpap used  . Wears glasses    Current Outpatient Medications on File Prior to Visit  Medication Sig Dispense Refill  . cyclobenzaprine (FLEXERIL) 10 MG tablet   2  . DUEXIS 800-26.6 MG TABS Take 1 tablet by mouth 3 (three) times daily.  0  . ferrous sulfate 325 (65 FE) MG tablet Take 325 mg by mouth daily with breakfast.    . Multiple Vitamin (MULTIVITAMIN WITH MINERALS) TABS Take 1 tablet by mouth daily.    . Olmesartan-Amlodipine-HCTZ 40-5-12.5 MG TABS Take 1 tablet by mouth daily. 90 tablet 3  . tolterodine (DETROL LA) 4 MG 24 hr capsule Take 1 capsule (4 mg total) by mouth daily. 90 capsule 3   No current facility-administered medications on file prior to visit.    ROS as in subjective   Objective: BP 128/80   Pulse 76   Wt 249 lb 12.8 oz (113.3 kg)   SpO2 96%   BMI 42.88 kg/m   Wt Readings from Last 3 Encounters:  11/24/16 249 lb 12.8 oz (113.3 kg)  10/04/16 245 lb 3.2 oz (111.2 kg)  10/03/16  244 lb 3.2 oz (110.8 kg)   Gen: wd, wn, nad Right upper posterior thigh with 3cm diameter area of mild erythema and induration, 3 smaller similar 2 cm diameter areas of induration and erythema left upper posterior thigh and buttock, all c/w cellulitis without abscess.  Exam chaperoned by nurse    Assessment: Encounter Diagnoses  Name Primary?  . Cellulitis of multiple sites of buttock Yes  . Hypertriglyceridemia   . Anemia, unspecified type   . Vitamin D deficiency   . Morbid obesity ([place])      Plan: cellulitis - discussed warm compresses, begin Doxycyline, and recheck if not much improved within the next 3-4 days  Hypertriglyceridemia, obesity - counseled on diet , exercise, need for weight loss efforts.    Anemia - refer to GI  Vit D deficiency - begin weekly Vit D deficiency she hasn't started yet.   Kayla Larsen was seen today for follow-up.  Diagnoses and all orders for this visit:  Cellulitis of multiple sites of buttock  Hypertriglyceridemia  Anemia, unspecified type  Vitamin D deficiency  Morbid obesity ([place])  Other orders -     doxycycline (VIBRA-TABS) 100 MG tablet; Take 1 tablet (100  mg total) 2 (two) times daily by mouth. -     cholecalciferol (VITAMIN D) 1000 units tablet; Take 1 tablet (1,000 Units total) daily by mouth.

## 2016-11-27 ENCOUNTER — Ambulatory Visit
Admission: RE | Admit: 2016-11-27 | Discharge: 2016-11-27 | Disposition: A | Discharge status: Home / Self Care | Payer: BLUE CROSS/BLUE SHIELD | Source: Ambulatory Visit | Attending: Medical | Admitting: Medical

## 2016-11-27 DIAGNOSIS — N611 Abscess of the breast and nipple: Secondary | ICD-10-CM

## 2016-12-08 ENCOUNTER — Other Ambulatory Visit: Payer: Self-pay | Admitting: Medical

## 2016-12-08 MED ORDER — DOXYCYCLINE HYCLATE 100 MG PO TABS: 100.0000 mg | ORAL_TABLET | Freq: Two times a day (BID) | ORAL | 0 refills | Status: DC

## 2016-12-08 NOTE — Telephone Encounter (Signed)
Pt is request another refill on doxycycline because she said that cyst has not completely when a way . She said that you gave her 2 refill on this but since she switch pharmacy [facility] will not  Use  the refill from the  rx that you had written .

## 2016-12-09 ENCOUNTER — Other Ambulatory Visit: Payer: Self-pay | Admitting: Medical

## 2016-12-11 NOTE — Telephone Encounter (Signed)
Can pt have a refill on this 

## 2016-12-28 ENCOUNTER — Other Ambulatory Visit: Payer: Self-pay | Admitting: Medical

## 2016-12-28 NOTE — Telephone Encounter (Signed)
Can pt have a refill on this 

## 2017-01-09 ENCOUNTER — Other Ambulatory Visit: Payer: Self-pay | Admitting: Medical

## 2017-01-10 NOTE — Telephone Encounter (Signed)
Is this ok to refill?  

## 2017-01-15 ENCOUNTER — Encounter (INDEPENDENT_AMBULATORY_CARE_PROVIDER_SITE_OTHER): Payer: Self-pay | Admitting: Physician Assistant

## 2017-01-15 ENCOUNTER — Ambulatory Visit (INDEPENDENT_AMBULATORY_CARE_PROVIDER_SITE_OTHER): Payer: BLUE CROSS/BLUE SHIELD | Admitting: Physician Assistant

## 2017-01-15 VITALS — Ht 64.0 in | Wt 249.0 lb

## 2017-01-15 DIAGNOSIS — M170 Bilateral primary osteoarthritis of knee: Secondary | ICD-10-CM | POA: Diagnosis not present

## 2017-01-15 MED ORDER — METHYLPREDNISOLONE ACETATE 40 MG/ML IJ SUSP
40.0000 mg | INTRAMUSCULAR | Status: AC | PRN
  Administered 2017-01-15: 40 mg via INTRA_ARTICULAR

## 2017-01-15 MED ORDER — LIDOCAINE HCL 1 % IJ SOLN
0.5000 mL | INTRAMUSCULAR | Status: AC | PRN
  Administered 2017-01-15: .5 mL

## 2017-01-15 MED ORDER — LIDOCAINE HCL 1 % IJ SOLN
0.5000 mL | INTRAMUSCULAR | Status: AC | PRN
Start: 2017-01-15 — End: 2017-01-15
  Administered 2017-01-15: .5 mL

## 2017-01-15 NOTE — Progress Notes (Signed)
   Procedure Note  Patient: Kayla Larsen             Date of Birth: September 12, 1970           MRN: [id number]             Visit Date: 01/15/2017  HPI Kayla Larsen returns today follow-up bilateral knees.  She has known osteoarthritis of both knees.  She is requesting injections in both knees.  She states the injections from 10/12/2016 gave her good relief until about 2 weeks ago.  Right knee is more painful than the left.  She is had no new injury to either knee.  She takes naproxen Tylenol and use Biofreeze with some relief from her knee pain.  Physical exam: Bilateral knees no effusion abnormal warmth erythema.  She has full extension and flexion to approximately 110 degrees.  Procedures: Visit Diagnoses: Primary osteoarthritis of both knees  Large Joint Inj: bilateral knee on 01/15/2017 11:16 AM Indications: pain Details: 22 G 1.5 in needle, anterolateral approach  Arthrogram: No  Medications (Right): 0.5 mL lidocaine 1 %; 40 mg methylPREDNISolone acetate 40 MG/ML Medications (Left): 0.5 mL lidocaine 1 %; 40 mg methylPREDNISolone acetate 40 MG/ML Outcome: tolerated well, no immediate complications Procedure, treatment alternatives, risks and benefits explained, specific risks discussed. Consent was given by the patient. Immediately prior to procedure a time out was called to verify the correct patient, procedure, equipment, support staff and site/side marked as required. Patient was prepped and draped in the usual sterile fashion.     Plan: She will continue her naproxen Biofreeze.  She will follow-up with [place] on an as-needed basis.  She understands that she can have injections notable for frequent and every 3 months.  Continue work on strengthening both knees.

## 2017-01-16 ENCOUNTER — Encounter (INDEPENDENT_AMBULATORY_CARE_PROVIDER_SITE_OTHER): Payer: Self-pay

## 2017-01-16 ENCOUNTER — Telehealth (INDEPENDENT_AMBULATORY_CARE_PROVIDER_SITE_OTHER): Payer: Self-pay | Admitting: Orthopaedic Surgery

## 2017-01-16 NOTE — Telephone Encounter (Signed)
thanks

## 2017-01-16 NOTE — Telephone Encounter (Signed)
See below

## 2017-01-16 NOTE — Telephone Encounter (Signed)
Faxed to provided number  

## 2017-01-16 NOTE — Telephone Encounter (Signed)
Ok no prolonged walking for today

## 2017-01-16 NOTE — Telephone Encounter (Signed)
Please advise, what you would like me to

## 2017-01-16 NOTE — Telephone Encounter (Signed)
Kayla Larsen  email- [email or web address] Fax # [phone]    Pt called and her job stated they need the letter to specify light duty along with what she can and cannot do.

## 2017-01-16 NOTE — Telephone Encounter (Signed)
Please cal her and see what she wants it to say she can or not do. It is for 3 days .

## 2017-01-29 ENCOUNTER — Telehealth: Payer: Self-pay | Admitting: Gastroenterology

## 2017-01-29 ENCOUNTER — Telehealth: Payer: Self-pay | Admitting: Medical

## 2017-01-29 ENCOUNTER — Encounter: Payer: Self-pay | Admitting: Medical

## 2017-01-29 ENCOUNTER — Ambulatory Visit: Payer: BLUE CROSS/BLUE SHIELD | Admitting: Medical

## 2017-01-29 VITALS — BP 140/88 | HR 69 | Wt 251.6 lb

## 2017-01-29 DIAGNOSIS — R609 Edema, unspecified: Secondary | ICD-10-CM

## 2017-01-29 DIAGNOSIS — D649 Anemia, unspecified: Secondary | ICD-10-CM | POA: Diagnosis not present

## 2017-01-29 DIAGNOSIS — Z1211 Encounter for screening for malignant neoplasm of colon: Secondary | ICD-10-CM | POA: Diagnosis not present

## 2017-01-29 DIAGNOSIS — I10 Essential (primary) hypertension: Secondary | ICD-10-CM | POA: Diagnosis not present

## 2017-01-29 LAB — CBC
HEMOGLOBIN: 10.5 g/dL — AB (ref 11.1–15.9)
Hematocrit: 33.3 % — ABNORMAL LOW (ref 34.0–46.6)
MCH: 23.4 pg — AB (ref 26.6–33.0)
MCHC: 31.5 g/dL (ref 31.5–35.7)
MCV: 74 fL — ABNORMAL LOW (ref 79–97)
Platelets: 344 10*3/uL (ref 150–379)
RBC: 4.49 x10E6/uL (ref 3.77–5.28)
RDW: 15.6 % — ABNORMAL HIGH (ref 12.3–15.4)
WBC: 6.6 10*3/uL (ref 3.4–10.8)

## 2017-01-29 MED ORDER — PREDNISONE 10 MG PO TABS: ORAL_TABLET | ORAL | 0 refills | Status: DC

## 2017-01-29 MED ORDER — BENZONATATE 200 MG PO CAPS: 200.0000 mg | ORAL_CAPSULE | Freq: Three times a day (TID) | ORAL | 0 refills | Status: DC | PRN

## 2017-01-29 NOTE — Telephone Encounter (Signed)
Last visit I had asked for referral to gastroenterology due to chronic anemia and due for repeat colonoscopy.  She states she hasn't heard back from GI.   Can you please check on status of gastroenterology referral.   This is the second request.   I requested in 09/2016 and 11/2016.  If we did send referral, please call to find out what the hold up is.  She states she hasn't been contacted by [place] or GI about the referral.

## 2017-01-29 NOTE — Addendum Note (Signed)
Addended by: Tyrone Apple on: 01/29/2017 01:27 PM   Modules accepted: Orders

## 2017-01-29 NOTE — Telephone Encounter (Signed)
Colon and path report printed from [facility]. Dr. Havery Moros is Doc of the Day for 1/21/19pm. Records placed on his desk for review.

## 2017-01-29 NOTE — Telephone Encounter (Signed)
Sent referral to [facility]

## 2017-01-29 NOTE — Progress Notes (Signed)
Subjective: Chief Complaint  Patient presents with  . Leg Swelling    swelling in both leg started last thursday ,   Here for leg swelling since Friday 3 days ago.   No pain, just swelling.   Has bad knees, sees orthopedist but no swelling in knees.  Feels swelling in both lower legs.  Denies SOB, chest pain.   Been drinking a lot of tea.   Did eat some pretzels the other night, had some chips the other day, ate biscuits several days last week.  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  Typically eats vanilla almond cereal.  bacon egg and cheese biscuit .  Doesn't eat lunch as she works 3 rd shift.   Dinner typically is grilled or baked chicken, broccoli and cheese.  Not drinking soda.  Denies chest pain or shortness of breath. No urinary changes.     Of note, she has hx/o anemia, but never heard back from GI from last visit, regarding referral for colonoscopy.    Past Medical History:  Diagnosis Date  . Acanthosis nigricans   . Anemia   . Arthritis of knee   . External hemorrhoid   . History of blood transfusion 2009   2 units after hysterectomy  . Hypertension   . Obesity   . Sleep apnea 2010   borderline no cpap used  . Wears glasses    Current Outpatient Medications on File Prior to Visit  Medication Sig Dispense Refill  . cholecalciferol (VITAMIN D) 1000 units tablet Take 1 tablet (1,000 Units total) daily by mouth. 90 tablet 3  . ferrous sulfate 325 (65 FE) MG tablet Take 325 mg by mouth daily with breakfast.    . Multiple Vitamin (MULTIVITAMIN WITH MINERALS) TABS Take 1 tablet by mouth daily.    . Olmesartan-Amlodipine-HCTZ 40-5-12.5 MG TABS Take 1 tablet by mouth daily. 90 tablet 3  . tolterodine (DETROL LA) 4 MG 24 hr capsule Take 1 capsule (4 mg total) by mouth daily. 90 capsule 3   No current facility-administered medications on file prior to visit.    ROS as in subjective   Objective BP 140/88    Pulse 69   Wt 251 lb 9.6 oz (114.1 kg)   SpO2 99%   BMI 43.19 kg/m   Wt Readings from Last 3 Encounters:  01/29/17 251 lb 9.6 oz (114.1 kg)  01/15/17 249 lb (112.9 kg)  11/24/16 249 lb 12.8 oz (113.3 kg)   BP Readings from Last 3 Encounters:  01/29/17 140/88  11/24/16 128/80  10/04/16 138/80   General appearance: alert, no distress, WD/WN,  Neck: supple, no lymphadenopathy, no thyromegaly, no masses, no JVD Heart: RRR, normal S1, S2, no murmurs Lungs: CTA bilaterally, no wheezes, rhonchi, or rales Abdomen: +bs, soft, non tender, non distended, no masses, no hepatomegaly, no splenomegaly Pulses: 1+ symmetric, upper and lower extremities, normal cap refill Ext: mild 1+ pitting edema in bilat lower legs, no calve tenderness or palpable cord     Assessment: Encounter Diagnoses  Name Primary?  . Edema, unspecified type Yes  . Anemia, unspecified type   . Essential hypertension      Plan: We discussed her symptoms and exam findings.  I reviewed labs and EKG and chart from September 2018.  I think her current swelling is transient.  In the past week she was on her feet a lot at work, she  was eating more salty things this past week but is already seeing improvement from this weekend.  We discussed amlodipine and the possibility of that causing swelling but I do not think is related.  Advised leg elevation, consider compression hose which she has some at home, regular exercise, salt reduction.  Advised to weight self daily this next week, and if  3-5 pound or more increase the next few days to call right away.  No signs of CHF or kidney disease not suspected.   Anemia - check CBC today.  She states she never heard back about GI referral from last visit.  We will look into this and make sure referral gets processed.   She is due for repeat colonoscopy as well as eval for anemia   HTN - c/t same medication

## 2017-01-29 NOTE — Telephone Encounter (Signed)
Please call patient and tell her to disregard any refill today as I sent 2 medications to her pharmacy in error.

## 2017-01-29 NOTE — Telephone Encounter (Signed)
I called patient already, disregard message.

## 2017-02-01 ENCOUNTER — Encounter: Payer: Self-pay | Admitting: Gastroenterology

## 2017-02-01 NOTE — Telephone Encounter (Signed)
Dr.Armbruster reviewed records and recommended patient come in for an office visit first. Left message for patient to call back and schedule an office visit. Records will be in file folder.

## 2017-03-16 ENCOUNTER — Ambulatory Visit: Payer: BLUE CROSS/BLUE SHIELD | Admitting: Gastroenterology

## 2017-03-16 ENCOUNTER — Encounter: Payer: Self-pay | Admitting: Gastroenterology

## 2017-03-16 ENCOUNTER — Other Ambulatory Visit: Payer: BLUE CROSS/BLUE SHIELD

## 2017-03-16 VITALS — BP 122/80 | HR 76 | Ht 64.5 in | Wt 245.5 lb

## 2017-03-16 DIAGNOSIS — Z791 Long term (current) use of non-steroidal anti-inflammatories (NSAID): Secondary | ICD-10-CM | POA: Diagnosis not present

## 2017-03-16 DIAGNOSIS — D509 Iron deficiency anemia, unspecified: Secondary | ICD-10-CM

## 2017-03-16 DIAGNOSIS — Z1211 Encounter for screening for malignant neoplasm of colon: Secondary | ICD-10-CM

## 2017-03-16 MED ORDER — NA SULFATE-K SULFATE-MG SULF 17.5-3.13-1.6 GM/177ML PO SOLN: 1.0000 | Freq: Once | ORAL | 0 refills | Status: AC

## 2017-03-16 NOTE — Progress Notes (Signed)
HPI :  47 y/o [place] American female with history of microcytic anemia, OSA, HTN, referred here for anemia / colonoscopy by Chana Bode  She previously had a colonoscopy in 2009 per Dr. Benson Norway for hemeoccult positive stools, she had rectal hyperplastic polyps and large hemorrhoids.   Labs January - Hgb 10.5, MCV 74, iron level of 87 Chronic microcytic anemia dating back years (>10 years). Iron panel in 2015 completely normal  She states she had a hysterectomy for menorrhagia and fibroids in 2009.  She states she has some cramping associated with a bowel movement at times but otherwise no persistent problems with her bowels.  She thinks she has some hemorrhoids that are irritated at times.  She has no new bowel changes otherwise.  She denies any clear blood in her stool.  She denies any diarrhea.  She has not rare straining.  She denies any family history of colon cancer.  She has plantar fasciitis osteoarthritis and was given a prescription of Vimovo (naproxen / nexium).  She has been taking this once daily and states it does not provide any benefit for her joint pain.  She denies any heartburn.  She denies any dysphasia.  She is not aware of any family history of thalassemia.  Colonoscopy 06/11/2007 - Dr. Benson Norway - multiple 59mm rectal polyps c/w hyperplastic polyps, large internal hemorrhoids   Past Medical History:  Diagnosis Date  . Acanthosis nigricans   . Anemia   . Arthritis of knee   . External hemorrhoid   . History of blood transfusion 2009   2 units after hysterectomy 47 . Hypertension   . Obesity   . Plantar fasciitis of left foot   . Sleep apnea 2010   borderline no cpap used  . Wears glasses      Past Surgical History:  Procedure Laterality Date  . ABDOMINAL HYSTERECTOMY  2009  . CESAREAN SECTION     x 2  . COLONOSCOPY  2009   done for anemia eval; Dr. Benson Norway  . KNEE SURGERY  10/2015   knot removed from R knee  . LAPAROTOMY Left 07/30/2012   Procedure: LEFT SALPINGO  OOPHERECTOMY,  OMENTECTOMY, AND LYSIS OF ADHESIONS;  Surgeon: Alvino Chapel, MD;  Location: [facility];  Service: Gynecology;  Laterality: Left;  . OOPHORECTOMY  2014   left   . removed a cyst on her ovaries  2014   Family History  Problem Relation Age of Onset  . Cancer Mother        liver and lung  . Hypertension Mother   . Diabetes Father   . Kidney failure Father   . Hypertension Brother   . Hypertension Brother   . Hypertension Brother   . Cancer Maternal Aunt        thyroid  . Diabetes Maternal Aunt   . Hypertension Maternal Aunt   . Heart disease Maternal Grandmother   . Diabetes Maternal Uncle   . Hypertension Maternal Uncle   . Stroke Neg Hx    Social History   Tobacco Use  . Smoking status: Former Smoker    Packs/day: 0.25    Years: 4.00    Pack years: 1.00    Types: Cigarettes    Last attempt to quit: 09/29/2011    Years since quitting: 5.4  . Smokeless tobacco: Never Used  Substance Use Topics  . Alcohol use: No    Alcohol/week: 1.2 oz    Types: 2 Shots of liquor per week  .  Drug use: No   Current Outpatient Medications  Medication Sig Dispense Refill  . cholecalciferol (VITAMIN D) 1000 units tablet Take 1 tablet (1,000 Units total) daily by mouth. 90 tablet 3  . ferrous sulfate 325 (65 FE) MG tablet Take 325 mg by mouth daily with breakfast.    . Multiple Vitamin (MULTIVITAMIN WITH MINERALS) TABS Take 1 tablet by mouth daily.    . Naproxen-Esomeprazole (VIMOVO) 500-20 MG TBEC Take 500 mg by mouth daily.    . Olmesartan-Amlodipine-HCTZ 40-5-12.5 MG TABS Take 1 tablet by mouth daily. 90 tablet 3  . tolterodine (DETROL LA) 4 MG 24 hr capsule Take 1 capsule (4 mg total) by mouth daily. 90 capsule 3  . TURMERIC PO Take by mouth daily.     No current facility-administered medications for this visit.    Allergies  Allergen Reactions  . Amlodipine     Ankle swelling at 10mg   . Lisinopril     Cough      Review of Systems: All systems reviewed  and negative except where noted in HPI.   Lab Results  Component Value Date   WBC 6.6 01/29/2017   HGB 10.5 (L) 01/29/2017   HCT 33.3 (L) 01/29/2017   MCV 74 (L) 01/29/2017   PLT 344 01/29/2017    Lab Results  Component Value Date   CREATININE 0.78 10/04/2016   BUN 21 10/04/2016   NA 139 10/04/2016   K 4.1 10/04/2016   CL 103 10/04/2016   CO2 26 10/04/2016    Lab Results  Component Value Date   ALT 20 10/04/2016   AST 13 10/04/2016   ALKPHOS 57 07/15/2015   BILITOT 0.3 10/04/2016    Lab Results  Component Value Date   IRON 87 10/04/2016   TIBC 327 09/10/2013     Physical Exam: BP 122/80   Pulse 76   Ht 5' 4.5" (1.638 m)   Wt 245 lb 8 oz (111.4 kg)   BMI 41.49 kg/m  Constitutional: Pleasant,well-developed, female in no acute distress. HEENT: Normocephalic and atraumatic. Conjunctivae are normal. No scleral icterus. Neck supple.  Cardiovascular: Normal rate, regular rhythm.  Pulmonary/chest: Effort normal and breath sounds normal. No wheezing, rales or rhonchi. Abdominal: Soft, nondistended, nontender. there are no masses palpable. No hepatomegaly. Extremities: no edema Lymphadenopathy: No cervical adenopathy noted. Neurological: Alert and oriented to person place and time. Skin: Skin is warm and dry. No rashes noted. Psychiatric: Normal mood and affect. Behavior is normal.   ASSESSMENT AND PLAN: 47 year old [place] American female with a long-standing stable microcytic anemia, who is due for colon cancer screening at this time.  In reviewing her chart she has had a few iron levels tested over the years which have been normal in the setting of ongoing anemia.  Persistent microcytosis is concerning for possible thalassemia.  Will obtain hemoglobin electrophoresis initially to assess for this.  Otherwise I do not think she has an iron deficiency given her prior lab workup.  She is due for routine colon cancer screening at this time otherwise.  I discussed  options with her to include optical colonoscopy and stool based testing.  She wanted to proceed with colonoscopy after discussion of risks and benefits.  Otherwise she is taking Vimovo routinely for her arthritic pain and states it does not provide any benefit.  Discussed long-term risks of NSAIDs with her and recommend she stop it if it does not help.  She will use Tylenol as needed for pain.  Morton Cellar, MD  [facility] Pager [phone]  CC: Chana Bode PA

## 2017-03-16 NOTE — Patient Instructions (Addendum)
If you are age 49 or older, your body mass index should be between 23-30. Your Body mass index is 41.49 kg/m. If this is out of the aforementioned range listed, please consider follow up with your Primary Care Provider.  If you are age 48 or younger, your body mass index should be between 19-25. Your Body mass index is 41.49 kg/m. If this is out of the aformentioned range listed, please consider follow up with your Primary Care Provider.   You have been scheduled for a colonoscopy. Please follow written instructions given to you at your visit today.  Please pick up your prep supplies at the pharmacy within the next 1-3 days. If you use inhalers (even only as needed), please bring them with you on the day of your procedure. Your physician has requested that you go to www.startemmi.com and enter the access code given to you at your visit today. This web site gives a general overview about your procedure. However, you should still follow specific instructions given to you by our office regarding your preparation for the procedure.   Please go to the lab in the basement of our building to have lab work done as you leave today.  Please discontinue using Vimoro.  Thank you for entrusting me with your care and for choosing [facility], Dr. [place] Cellar

## 2017-03-19 LAB — HEMOGLOBINOPATHY EVALUATION
HGB C: 0 %
HGB S: 0 %
HGB VARIANT: 0 %
Hemoglobin A2 Quantitation: 2.2 % (ref 1.8–3.2)
Hemoglobin F Quantitation: 0 % (ref 0.0–2.0)
Hgb A: 97.8 % (ref 96.4–98.8)

## 2017-03-22 ENCOUNTER — Encounter: Payer: Self-pay | Admitting: Gastroenterology

## 2017-04-02 ENCOUNTER — Ambulatory Visit (INDEPENDENT_AMBULATORY_CARE_PROVIDER_SITE_OTHER): Payer: BLUE CROSS/BLUE SHIELD | Admitting: Physician Assistant

## 2017-04-02 ENCOUNTER — Encounter (INDEPENDENT_AMBULATORY_CARE_PROVIDER_SITE_OTHER): Payer: Self-pay | Admitting: Physician Assistant

## 2017-04-02 DIAGNOSIS — M170 Bilateral primary osteoarthritis of knee: Secondary | ICD-10-CM | POA: Diagnosis not present

## 2017-04-02 MED ORDER — METHYLPREDNISOLONE ACETATE 40 MG/ML IJ SUSP
40.0000 mg | INTRAMUSCULAR | Status: AC | PRN
Start: 2017-04-02 — End: 2017-04-02
  Administered 2017-04-02: 40 mg via INTRA_ARTICULAR

## 2017-04-02 MED ORDER — DICLOFENAC SODIUM 1 % TD GEL: 4.0000 g | Freq: Four times a day (QID) | TRANSDERMAL | 2 refills | Status: DC

## 2017-04-02 MED ORDER — LIDOCAINE HCL 1 % IJ SOLN
3.0000 mL | INTRAMUSCULAR | Status: AC | PRN
  Administered 2017-04-02: 3 mL

## 2017-04-02 MED ORDER — DICLOFENAC SODIUM 75 MG PO TBEC: 75.0000 mg | DELAYED_RELEASE_TABLET | Freq: Two times a day (BID) | ORAL | 3 refills | Status: DC

## 2017-04-02 MED ORDER — METHYLPREDNISOLONE ACETATE 40 MG/ML IJ SUSP
40.0000 mg | INTRAMUSCULAR | Status: AC | PRN
  Administered 2017-04-02: 40 mg via INTRA_ARTICULAR

## 2017-04-02 NOTE — Progress Notes (Addendum)
   Procedure Note  Patient: Kayla Larsen             Date of Birth: [birth year]           MRN: [id number]             Visit Date: 04/02/2017   HPI: Kayla Larsen comes in today due to bilateral knee pain.  She had to change jobs due to the fact that she could not be on her feet working 12 hours a day and is now taken a job at lesser pay doing sedentary work mostly.  Only working 8-hour shifts.  She has had no new injury to either knee.  She has known osteoarthritis bilateral knees.  Physical exam: Bilateral knees no effusion abnormal warmth erythema overall good range of motion of both knees. Procedures: Visit Diagnoses: Primary osteoarthritis of both knees  Large Joint Inj: bilateral knee on 04/02/2017 10:31 AM Indications: pain Details: 22 G 1.5 in needle, anterolateral approach  Arthrogram: No  Medications (Right): 3 mL lidocaine 1 %; 40 mg methylPREDNISolone acetate 40 MG/ML Medications (Left): 3 mL lidocaine 1 %; 40 mg methylPREDNISolone acetate 40 MG/ML Outcome: tolerated well, no immediate complications Procedure, treatment alternatives, risks and benefits explained, specific risks discussed. Consent was given by the patient. Immediately prior to procedure a time out was called to verify the correct patient, procedure, equipment, support staff and site/side marked as required. Patient was prepped and draped in the usual sterile fashion.     Plan: Discussed with her that we should wait 3 months between injections.  She understands this.  Did fill out a handicap placard sheet for.  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  Refill on her diclofenac and Voltaren gel was done today.  She will continue her Tumeric.

## 2017-04-03 ENCOUNTER — Telehealth: Payer: Self-pay | Admitting: Gastroenterology

## 2017-04-03 NOTE — Telephone Encounter (Signed)
Okay she should reschedule at her convenience.

## 2017-04-03 NOTE — Telephone Encounter (Signed)
Hi Dr. Havery Moros, this pt cancelled colon scheduled on 04/05/17. She just changed jobs and does not have new insurance information. She will call back to r/s. Thank you.

## 2017-04-05 ENCOUNTER — Encounter: Payer: BLUE CROSS/BLUE SHIELD | Admitting: Gastroenterology

## 2017-04-26 NOTE — Telephone Encounter (Signed)
FYI

## 2017-07-25 ENCOUNTER — Ambulatory Visit (INDEPENDENT_AMBULATORY_CARE_PROVIDER_SITE_OTHER): Payer: BLUE CROSS/BLUE SHIELD | Admitting: Physician Assistant

## 2017-07-26 ENCOUNTER — Telehealth (INDEPENDENT_AMBULATORY_CARE_PROVIDER_SITE_OTHER): Payer: Self-pay | Admitting: Radiology

## 2017-07-26 NOTE — Telephone Encounter (Signed)
Patient left voicemail stating that she has been contacted by disability office that information receive was incomplete and needs information faxed including... Return to work date, full capabilities or restrictions, and that she can only work 8hrs if no accommodations can be made for 12hr shift.  Fax to [phone]  Patient call back [phone]

## 2017-07-27 NOTE — Telephone Encounter (Signed)
Can you please clarify so information can be faxed. Thank you

## 2017-07-27 NOTE — Telephone Encounter (Signed)
She needs the doctor to send a letter to them, not a form to be filled out.

## 2017-07-30 NOTE — Telephone Encounter (Signed)
Not sure what she wants can you call her.

## 2017-07-30 NOTE — Telephone Encounter (Signed)
See below

## 2017-07-31 ENCOUNTER — Encounter (INDEPENDENT_AMBULATORY_CARE_PROVIDER_SITE_OTHER): Payer: Self-pay

## 2017-07-31 NOTE — Telephone Encounter (Signed)
Faxed note to provided number

## 2017-08-27 ENCOUNTER — Telehealth (INDEPENDENT_AMBULATORY_CARE_PROVIDER_SITE_OTHER): Payer: Self-pay

## 2017-08-27 NOTE — Telephone Encounter (Signed)
Faxed completed job description questionnaire back to employer

## 2017-09-03 ENCOUNTER — Ambulatory Visit (INDEPENDENT_AMBULATORY_CARE_PROVIDER_SITE_OTHER): Payer: BLUE CROSS/BLUE SHIELD | Admitting: Physician Assistant

## 2017-09-07 ENCOUNTER — Other Ambulatory Visit: Payer: Self-pay

## 2017-09-13 ENCOUNTER — Encounter (INDEPENDENT_AMBULATORY_CARE_PROVIDER_SITE_OTHER): Payer: Self-pay | Admitting: Physician Assistant

## 2017-09-13 ENCOUNTER — Ambulatory Visit (INDEPENDENT_AMBULATORY_CARE_PROVIDER_SITE_OTHER): Payer: 59 | Admitting: Physician Assistant

## 2017-09-13 DIAGNOSIS — M170 Bilateral primary osteoarthritis of knee: Secondary | ICD-10-CM

## 2017-09-13 MED ORDER — METHYLPREDNISOLONE ACETATE 40 MG/ML IJ SUSP
40.0000 mg | INTRAMUSCULAR | Status: AC | PRN
Start: 2017-09-13 — End: 2017-09-13
  Administered 2017-09-13: 40 mg via INTRA_ARTICULAR

## 2017-09-13 MED ORDER — LIDOCAINE HCL 1 % IJ SOLN
0.5000 mL | INTRAMUSCULAR | Status: AC | PRN
  Administered 2017-09-13: .5 mL

## 2017-09-13 MED ORDER — DICLOFENAC SODIUM 1 % TD GEL: 4.0000 g | Freq: Four times a day (QID) | TRANSDERMAL | 2 refills | Status: DC

## 2017-09-13 MED ORDER — LIDOCAINE HCL 1 % IJ SOLN
0.5000 mL | INTRAMUSCULAR | Status: AC | PRN
Start: 2017-09-13 — End: 2017-09-13
  Administered 2017-09-13: .5 mL

## 2017-09-13 MED ORDER — METHYLPREDNISOLONE ACETATE 40 MG/ML IJ SUSP
40.0000 mg | INTRAMUSCULAR | Status: AC | PRN
  Administered 2017-09-13: 40 mg via INTRA_ARTICULAR

## 2017-09-13 NOTE — Progress Notes (Signed)
   Procedure Note  Patient: Kayla Larsen             Date of Birth: [birth year]           MRN: [id number]             Visit Date: 09/13/2017  HPI: [name] returns today requesting bilateral knee injections.  She has known arthritis of both knees.  She states the last cortisone injections 04/02/2017 really helped for a while.  She said no new injury.  She is also requesting a handicap permit, hinged knee brace and refill on her diclofenac. She is taking Tumeric which she states is helping with her knee pain.  Physical exam: Bilateral knees good range of motion no effusion abnormal warmth or erythema. Procedures: Visit Diagnoses: Primary osteoarthritis of both knees  Large Joint Inj: bilateral knee on 09/13/2017 4:58 PM Indications: pain Details: 22 G 1.5 in needle, anterolateral approach  Arthrogram: No  Medications (Right): 0.5 mL lidocaine 1 %; 40 mg methylPREDNISolone acetate 40 MG/ML Medications (Left): 0.5 mL lidocaine 1 %; 40 mg methylPREDNISolone acetate 40 MG/ML Outcome: tolerated well, no immediate complications Procedure, treatment alternatives, risks and benefits explained, specific risks discussed. Consent was given by the patient. Immediately prior to procedure a time out was called to verify the correct patient, procedure, equipment, support staff and site/side marked as required. Patient was prepped and draped in the usual sterile fashion.     Plan: She will work on Forensic scientist.  Follow-up with [place] on as-needed basis she understands that she can have cortisone injections no more often than every 3 months.

## 2017-09-25 ENCOUNTER — Ambulatory Visit: Payer: 59 | Admitting: Medical

## 2017-09-25 ENCOUNTER — Encounter: Payer: Self-pay | Admitting: Medical

## 2017-09-25 VITALS — BP 146/80 | HR 69 | Temp 98.1°F | Resp 16 | Ht 64.5 in | Wt 231.2 lb

## 2017-09-25 DIAGNOSIS — R202 Paresthesia of skin: Secondary | ICD-10-CM

## 2017-09-25 DIAGNOSIS — M7912 Myalgia of auxiliary muscles, head and neck: Secondary | ICD-10-CM | POA: Diagnosis not present

## 2017-09-25 DIAGNOSIS — Z23 Encounter for immunization: Secondary | ICD-10-CM | POA: Insufficient documentation

## 2017-09-25 DIAGNOSIS — M542 Cervicalgia: Secondary | ICD-10-CM | POA: Insufficient documentation

## 2017-09-25 DIAGNOSIS — I10 Essential (primary) hypertension: Secondary | ICD-10-CM

## 2017-09-25 MED ORDER — CYCLOBENZAPRINE HCL 10 MG PO TABS: ORAL_TABLET | ORAL | 0 refills | Status: DC

## 2017-09-25 NOTE — Progress Notes (Signed)
Subjective: Chief Complaint  Patient presents with  . collar bone pain    right side shoulder/neck pain tingling down right arm   Here for pains in right shoulder and neck, tingling down right arm  Started 4 days ago with pain around right collar bone.  Thinks she slept wrong on this.  No recent strenuous activity but the night before the pain worked with head bend down in flexion for hours.  Currently feels tingling and numbness in right hand, pain in right neck/collar bone area.   No fever, no headaches, no back pain.   No hand weakness.  No confusion, no blurred vision, no dizziness.  No other aggravating or relieving factors. No other complaint.  Past Medical History:  Diagnosis Date  . Acanthosis nigricans   . Anemia   . Arthritis of knee   . External hemorrhoid   . History of blood transfusion 2009   2 units after hysterectomy  . Hypertension   . Obesity   . Plantar fasciitis of left foot   . Sleep apnea 2010   borderline no cpap used  . Wears glasses    Current Outpatient Medications on File Prior to Visit  Medication Sig Dispense Refill  . cholecalciferol (VITAMIN D) 1000 units tablet Take 1 tablet (1,000 Units total) daily by mouth. 90 tablet 3  . diclofenac (VOLTAREN) 75 MG EC tablet Take 1 tablet (75 mg total) by mouth 2 (two) times daily. 60 tablet 3  . diclofenac sodium (VOLTAREN) 1 % GEL Apply 4 g topically 4 (four) times daily. 1 Tube 2  . ferrous sulfate 325 (65 FE) MG tablet Take 325 mg by mouth daily with breakfast.    . Multiple Vitamin (MULTIVITAMIN WITH MINERALS) TABS Take 1 tablet by mouth daily.    . Olmesartan-Amlodipine-HCTZ 40-5-12.5 MG TABS Take 1 tablet by mouth daily. 90 tablet 3  . tolterodine (DETROL LA) 4 MG 24 hr capsule Take 1 capsule (4 mg total) by mouth daily. 90 capsule 3  . TURMERIC PO Take by mouth daily.     No current facility-administered medications on file prior to visit.    ROS as in subjective    Objective: BP (!) 146/80    Pulse 69   Temp 98.1 F (36.7 C) (Oral)   Resp 16   Ht 5' 4.5" (1.638 m)   Wt 231 lb 3.2 oz (104.9 kg)   SpO2 95%   BMI 39.07 kg/m   BP Readings from Last 3 Encounters:  09/25/17 (!) 146/80  03/16/17 122/80  01/29/17 140/88   Wt Readings from Last 3 Encounters:  09/25/17 231 lb 3.2 oz (104.9 kg)  03/16/17 245 lb 8 oz (111.4 kg)  01/29/17 251 lb 9.6 oz (114.1 kg)   Gen: wd, wn, nad Skin unremarkable Neck: tender over right SCM, tender at sternoclavicular joint, mild pain with neck ROM in same area, otherwise nontender, no lymphadenopathy, no mass Shoulder and arm nontender Mild +tinel's on right, otherwise, Arms with normal strength, sensation DTRs Normal UE pulses Ext: no edema     Assessment: Encounter Diagnoses  Name Primary?  . Sternocleidomastoid muscle tenderness Yes  . Right hand paresthesia   . Need for influenza vaccination   . Neck pain   . Essential hypertension      Plan: Discussed symptom and findings, not quite torticollis but more of a neck strain.    Possibly has some mild CTS syndrome or arm.   Patient Instructions  Recommendations:  Use the muscle  relaxer Flexeril at your bedtime for the next couple days to see if it relaxes the pain and tension in your neck  Avoid sleeping with your neck in an unusual position, use a comfortable neutral neck position  Use some gentle stretching of your neck and shoulders and arms the next few days  Consider moving your desk up to a more level position so you are not looking down during your whole work shift  Use the diclofenac oral tablet the next 4 to 5 days for this neck and arm symptoms then just use as needed after that  Begin using a reinforced wrist splint at bedtime for possible carpal tunnel for the next week or 2 to see if this helps calm down the numbness and tingling  If this is not helping your numbness or tingling in the next 2 weeks then lets either recheck or have you discuss with your  orthopedic doctor  Counseled on the influenza virus vaccine.  Vaccine information sheet given.  Influenza vaccine given after consent obtained.  HTN - c/t current medication, f/u soon for physical  F/u soon to discuss hot flashes and physical  Jahnya was seen today for collar bone pain.  Diagnoses and all orders for this visit:  Sternocleidomastoid muscle tenderness  Right hand paresthesia  Need for influenza vaccination -     Flu Vaccine QUAD 6+ mos PF IM (Fluarix Quad PF)  Neck pain  Essential hypertension  Other orders -     cyclobenzaprine (FLEXERIL) 10 MG tablet; 1/2 or 1 tablet po QHS prn

## 2017-09-25 NOTE — Patient Instructions (Signed)
Recommendations:  Use the muscle relaxer Flexeril at your bedtime for the next couple days to see if it relaxes the pain and tension in your neck  Avoid sleeping with your neck in an unusual position, use a comfortable neutral neck position  Use some gentle stretching of your neck and shoulders and arms the next few days  Consider moving your desk up to a more level position so you are not looking down during your whole work shift  Use the diclofenac oral tablet the next 4 to 5 days for this neck and arm symptoms then just use as needed after that  Begin using a reinforced wrist splint at bedtime for possible carpal tunnel for the next week or 2 to see if this helps calm down the numbness and tingling  If this is not helping your numbness or tingling in the next 2 weeks then lets either recheck or have you discuss with your orthopedic doctor

## 2017-10-06 ENCOUNTER — Other Ambulatory Visit: Payer: Self-pay | Admitting: Medical

## 2017-10-11 ENCOUNTER — Other Ambulatory Visit: Payer: Self-pay | Admitting: Medical

## 2017-10-18 ENCOUNTER — Encounter: Payer: 59 | Admitting: Medical

## 2017-10-26 ENCOUNTER — Other Ambulatory Visit: Payer: Self-pay | Admitting: Medical

## 2017-10-26 DIAGNOSIS — Z1231 Encounter for screening mammogram for malignant neoplasm of breast: Secondary | ICD-10-CM

## 2017-11-06 ENCOUNTER — Other Ambulatory Visit: Payer: Self-pay | Admitting: Medical

## 2017-11-16 ENCOUNTER — Encounter: Payer: Self-pay | Admitting: Medical

## 2017-11-16 ENCOUNTER — Ambulatory Visit (INDEPENDENT_AMBULATORY_CARE_PROVIDER_SITE_OTHER): Payer: 59 | Admitting: Medical

## 2017-11-16 VITALS — BP 130/76 | HR 64 | Temp 98.5°F | Resp 16 | Ht 64.0 in | Wt 227.4 lb

## 2017-11-16 DIAGNOSIS — Z0000 Encounter for general adult medical examination without abnormal findings: Secondary | ICD-10-CM

## 2017-11-16 DIAGNOSIS — D649 Anemia, unspecified: Secondary | ICD-10-CM | POA: Diagnosis not present

## 2017-11-16 DIAGNOSIS — E049 Nontoxic goiter, unspecified: Secondary | ICD-10-CM

## 2017-11-16 DIAGNOSIS — R7301 Impaired fasting glucose: Secondary | ICD-10-CM

## 2017-11-16 DIAGNOSIS — Z862 Personal history of diseases of the blood and blood-forming organs and certain disorders involving the immune mechanism: Secondary | ICD-10-CM | POA: Diagnosis not present

## 2017-11-16 DIAGNOSIS — E559 Vitamin D deficiency, unspecified: Secondary | ICD-10-CM | POA: Diagnosis not present

## 2017-11-16 DIAGNOSIS — I10 Essential (primary) hypertension: Secondary | ICD-10-CM

## 2017-11-16 DIAGNOSIS — N3281 Overactive bladder: Secondary | ICD-10-CM

## 2017-11-16 LAB — POCT URINALYSIS DIP (PROADVANTAGE DEVICE)
Bilirubin, UA: NEGATIVE
Blood, UA: NEGATIVE
Glucose, UA: NEGATIVE mg/dL
Ketones, POC UA: NEGATIVE mg/dL
LEUKOCYTES UA: NEGATIVE
Nitrite, UA: NEGATIVE
PROTEIN UA: NEGATIVE mg/dL
SPECIFIC GRAVITY, URINE: 1.03
UUROB: NEGATIVE
pH, UA: 6 (ref 5.0–8.0)

## 2017-11-16 NOTE — Progress Notes (Signed)
Subjective:   HPI  Kayla Larsen is a 47 y.o. female who presents for Chief Complaint  Patient presents with  . CPE    CPE   eye exam 11-19  ate at 4 am    Medical care team includes: , Leward Quan here for primary care Dentist Eye doctor Dr. Jodi Mourning, gyn, prior with Dr. Gracy Racer for many years Dr. Benson Norway, GI Erskine Emery, PA-C ortho ENT - Dr. Melida Quitter   Concerns: None today  Reviewed their medical, surgical, family, social, medication, and allergy history and updated chart as appropriate.  Past Medical History:  Diagnosis Date  . Acanthosis nigricans   . Anemia   . Arthritis of knee   . External hemorrhoid   . History of blood transfusion 2009   2 units after hysterectomy  . Hypertension   . Obesity   . Plantar fasciitis of left foot   . Sleep apnea 2010   borderline no cpap used  . Wears glasses     Past Surgical History:  Procedure Laterality Date  . ABDOMINAL HYSTERECTOMY  2009  . CESAREAN SECTION     x 2  . COLONOSCOPY  2009   done for anemia eval; Dr. Benson Norway  . KNEE SURGERY  10/2015   knot removed from R knee  . LAPAROTOMY Left 07/30/2012   Procedure: LEFT SALPINGO OOPHERECTOMY,  OMENTECTOMY, AND LYSIS OF ADHESIONS;  Surgeon: Alvino Chapel, MD;  Location: [facility];  Service: Gynecology;  Laterality: Left;  . OOPHORECTOMY  2014   left   . removed a cyst on her ovaries  2014    Social History   Socioeconomic History  . Marital status: Single    Spouse name: Not on file  . Number of children: Not on file  . Years of education: Not on file  . Highest education level: Not on file  Occupational History  . Not on file  Social Needs  . Financial resource strain: Not on file  . Food insecurity:    Worry: Not on file    Inability: Not on file  . Transportation needs:    Medical: Not on file    Non-medical: Not on file  Tobacco Use  . Smoking status: Former Smoker    Packs/day: 0.25    Years: 4.00    Pack years:  1.00    Types: Cigarettes    Last attempt to quit: 09/29/2011    Years since quitting: 6.1  . Smokeless tobacco: Never Used  Substance and Sexual Activity  . Alcohol use: No    Alcohol/week: 2.0 standard drinks    Types: 2 Shots of liquor per week  . Drug use: No  . Sexual activity: Never  Lifestyle  . Physical activity:    Days per week: Not on file    Minutes per session: Not on file  . Stress: Not on file  Relationships  . Social connections:    Talks on phone: Not on file    Gets together: Not on file    Attends religious service: Not on file    Active member of club or organization: Not on file    Attends meetings of clubs or organizations: Not on file    Relationship status: Not on file  . Intimate partner violence:    Fear of current or ex partner: Not on file    Emotionally abused: Not on file    Physically abused: Not on file    Forced sexual activity:  Not on file  Other Topics Concern  . Not on file  Social History Narrative   Single, works as an Agricultural consultant in Teacher, adult education.  Goes to the gym some.  Works 3rd shift    Family History  Problem Relation Age of Onset  . Cancer Mother        liver and lung  . Hypertension Mother   . Diabetes Father   . Kidney failure Father   . Hypertension Brother   . Hypertension Brother   . Hypertension Brother   . Cancer Maternal Aunt        thyroid  . Diabetes Maternal Aunt   . Hypertension Maternal Aunt   . Heart disease Maternal Grandmother   . Diabetes Maternal Uncle   . Hypertension Maternal Uncle   . Stroke Neg Hx      Current Outpatient Medications:  .  cholecalciferol (VITAMIN D) 1000 units tablet, Take 1 tablet (1,000 Units total) daily by mouth., Disp: 90 tablet, Rfl: 3 .  diclofenac (VOLTAREN) 75 MG EC tablet, Take 1 tablet (75 mg total) by mouth 2 (two) times daily., Disp: 60 tablet, Rfl: 3 .  diclofenac sodium (VOLTAREN) 1 % GEL, Apply 4 g topically 4 (four) times daily., Disp: 1 Tube, Rfl: 2 .  ferrous  sulfate 325 (65 FE) MG tablet, Take 325 mg by mouth daily with breakfast., Disp: , Rfl:  .  Multiple Vitamin (MULTIVITAMIN WITH MINERALS) TABS, Take 1 tablet by mouth daily., Disp: , Rfl:  .  Olmesartan-amLODIPine-HCTZ 40-5-12.5 MG TABS, TAKE ONE TABLET BY MOUTH DAILY, Disp: 90 tablet, Rfl: 0 .  potassium chloride (K-DUR) 10 MEQ tablet, Take 10 mEq by mouth daily., Disp: , Rfl:  .  tolterodine (DETROL LA) 4 MG 24 hr capsule, TAKE ONE CAPSULE BY MOUTH DAILY, Disp: 30 capsule, Rfl: 0 .  TURMERIC PO, Take by mouth daily., Disp: , Rfl:  .  cyclobenzaprine (FLEXERIL) 10 MG tablet, 1/2 or 1 tablet po QHS prn (Patient not taking: Reported on 11/16/2017), Disp: 10 tablet, Rfl: 0  Allergies  Allergen Reactions  . Amlodipine     Ankle swelling at 10mg   . Lisinopril     Cough      Review of Systems Constitutional: -fever, -chills, -sweats, -unexpected weight change, -decreased appetite, -fatigue Allergy: -sneezing, -itching, -congestion Dermatology: -changing moles, --rash, -lumps ENT: -runny nose, -ear pain, -sore throat, -hoarseness, -sinus pain, -teeth pain, - ringing in ears, -hearing loss, -nosebleeds Cardiology: -chest pain, -palpitations, -swelling, -difficulty breathing when lying flat, -waking up short of breath Respiratory: -cough, -shortness of breath, -difficulty breathing with exercise or exertion, -wheezing, -coughing up blood Gastroenterology: -abdominal pain, -nausea, -vomiting, -diarrhea, -constipation, -blood in stool, -changes in bowel movement, -difficulty swallowing or eating Hematology: -bleeding, -bruising  Musculoskeletal: +joint aches, -muscle aches, -joint swelling, -back pain, -neck pain, -cramping, -changes in gait Ophthalmology: denies vision changes, eye redness, itching, discharge Urology: -burning with urination, -difficulty urinating, -blood in urine, -urinary frequency, -urgency, -incontinence Neurology: -headache, -weakness, -tingling, -numbness, -memory loss,  -falls, -dizziness Psychology: -depressed mood, -agitation, -sleep problems Breast/gyn: -breast tenderness, -discharge, -lumps, -vaginal discharge,- irregular periods, -heavy periods     Objective:  BP 130/76   Pulse 64   Temp 98.5 F (36.9 C) (Oral)   Resp 16   Ht 5\' 4"  (1.626 m)   Wt 227 lb 6.4 oz (103.1 kg)   SpO2 98%   BMI 39.03 kg/m   General appearance: alert, no distress, WD/WN, African American female Skin: several tattoos, tattoo  back of neck, wrist, other HEENT: normocephalic, conjunctiva/corneas normal, sclerae anicteric, PERRLA, EOMi, nares patent, no discharge or erythema, pharynx normal Oral cavity: MMM, tongue normal, teeth in good repair Neck: supple, no lymphadenopathy, no thyromegaly, no masses, normal ROM, no bruits Chest: non tender, normal shape and expansion Heart: RRR, normal S1, S2, no murmurs Lungs: CTA bilaterally, no wheezes, rhonchi, or rales Abdomen: +bs, soft, non tender, non distended, no masses, no hepatomegaly, no splenomegaly, no bruits Back: non tender, normal ROM, no scoliosis Musculoskeletal: knee sleeve/support in place on both knees, upper extremities non tender, no obvious deformity, normal ROM throughout, lower extremities non tender, no obvious deformity, normal ROM throughout Extremities: no edema, no cyanosis, no clubbing Pulses: 2+ symmetric, upper and lower extremities, normal cap refill Neurological: alert, oriented x 3, CN2-12 intact, strength normal upper extremities and lower extremities, sensation normal throughout, DTRs 2+ throughout, no cerebellar signs, gait normal Psychiatric: normal affect, behavior normal, pleasant  Breast/gyn/rectal - deferred to gynecology    Assessment and Plan :   Encounter Diagnoses  Name Primary?  . Routine general medical examination at a health care facility Yes  . Vitamin D deficiency   . History of anemia   . Anemia, unspecified type   . Overactive bladder   . Impaired fasting blood sugar    . Goiter   . Essential hypertension     Physical exam - discussed and counseled on healthy lifestyle, diet, exercise, preventative care, vaccinations, sick and well care, proper use of emergency dept and after hours care, and addressed their concerns.    Health screening: See your eye doctor yearly for routine vision care. See your dentist yearly for routine dental care including hygiene visits twice yearly.  Cancer screening She sees Dr. Jodi Mourning, gynecology, up-to-date on mammogram and Pap smear.  She prior saw Dr. Gracy Racer for many years, until he retired  Vaccinations: She is up-to-date on influenza and tetanus vaccine, will be due for Shingrix at age 56   Acute issues discussed: none  Separate significant chronic issues discussed: Vitamin D deficiency-continue vitamin D supplement thousand units daily, follow-up pending labs  History of anemia and iron deficiency-she never went to gastro this past year as advised.  Recheck labs today, continue ferrous sulfate once daily for now and will decide if we need to make a referral again.  Regardless she would be due at age 24 anyhow for screening colonoscopy  Hypertension-continue home losartan amlodipine HCTZ 40/5/12 0.5 mg daily along with K-Dur 10 mEq daily  Overactive bladder -doing fine on Detrol LA 4 mg daily  Obesity- spent some time today discussing her lifestyle and congratulated her on the weight loss she has made since last year.  Counseled on cutting out grains and breads for the time being, eating 3 meals daily with 2 snacks including a lot more vegetables and combining protein plus carb each meal.  Continue efforts at weight loss.  She did start back drinking soda from last year so advised she cut this out.  Lynna was seen today for cpe.  Diagnoses and all orders for this visit:  Routine general medical examination at a health care facility -     POCT Urinalysis DIP (Proadvantage Device) -     VITAMIN D 25  Hydroxy (Vit-D Deficiency, Fractures) -     Comprehensive metabolic panel -     CBC with Differential/Platelet -     Lipid panel -     TSH -     Hemoglobin  A1c  Vitamin D deficiency -     VITAMIN D 25 Hydroxy (Vit-D Deficiency, Fractures)  History of anemia  Anemia, unspecified type -     CBC with Differential/Platelet  Overactive bladder  Impaired fasting blood sugar -     Hemoglobin A1c  Goiter -     TSH  Essential hypertension   Follow-up pending labs, yearly for physical

## 2017-11-16 NOTE — Patient Instructions (Signed)
  I want you to eat 3 meals a day +2 snacks, one midmorning snack and one mid afternoon snack  Breakfast You may eat 1 of the following  Omelette, which can include a small amount of cheese, and vegetables such as peppers, mushrooms, small pieces of [place] or chicken  Low sugar yogurt serving which can include some fruit such as berries  Egg whites or hard boiled egg and meat (1-2 strips of bacon, or small piece of [place] sausage or [place] bacon)   Mid-morning snack 1 fruit serving such as one of the following:  medium-sized apple  medium-sized orange,  Tangerine  1/2 banana   3/4 cup of fresh berries or frozen berries  A protein source such as one of the following:  8 almonds   small handful of walnuts or other nuts  small piece of cheese,  low sugar yogurt   Lunch A protein source such as 1 of the following: . 1 serving of beans such as black beans, pinto beans, green beans, or edamame (soy beans) . 1 meat serving such as 6 oz or deck of card size serving of fish, skinless chicken, or [place], either grilled or baked preferably.   You can use some pork or beef, but limit this compared to fish, chicken or [place] Vegetable - Half of your plate should be a non-starchy vegetables!  So avoid white potatoes and corn.  Otherwise, eat a large portion of vegetables. . Avocado, cucumber, tomato, carrots, greens, lettuce, squash, okra, etc.  . Vegetables can include salad with olive oil/vinaigrette dressing   Mid-afternoon snack 1 fruit serving such as one of the following:  medium-sized apple  medium-sized orange,  Tangerine  1/2 banana   3/4 cup of fresh berries or frozen berries  A protein source such as one of the following:  8 almonds   small handful of walnuts or other nuts  small piece of cheese,  low sugar yogurt   Dinner A protein source such as 1 of the following: . 1 serving of beans such as black beans, pinto beans, green beans, or edamame (soy  beans) . 1 meat serving such as 6 oz or deck of card size serving of fish, skinless chicken, or [place], either grilled or baked preferably.   You can use some pork or beef, but limit this compared to fish, chicken or [place] Vegetable - Half of your plate should be a non-starchy vegetables!  So avoid white potatoes and corn.  Otherwise, eat a large portion of vegetables. . Avocado, cucumber, tomato, carrots, greens, lettuce, squash, okra, etc.  . Vegetables can include salad with olive oil/vinaigrette dressing   Beverages: Water Unsweet tea Home made juice with a juicer without sugar added other than small bit of honey or agave nectar Water with sugar free flavor such as [place]   AVOID.... For the time being I want you to cut out the following items completely: . Soda, sweet tea, juice, beer or wine or alcohol . ALL grains and breads including rice, pasta, bread, cereal . Sweets such as cake, candy, pies, chips, cookies, chocolate

## 2017-11-17 LAB — LIPID PANEL
CHOLESTEROL TOTAL: 176 mg/dL (ref 100–199)
Chol/HDL Ratio: 3.8 ratio (ref 0.0–4.4)
HDL: 46 mg/dL (ref >39)
LDL Calculated: 95 mg/dL (ref 0–99)
Triglycerides: 175 mg/dL — ABNORMAL HIGH (ref 0–149)
VLDL CHOLESTEROL CAL: 35 mg/dL (ref 5–40)

## 2017-11-17 LAB — COMPREHENSIVE METABOLIC PANEL
A/G RATIO: 1.8 (ref 1.2–2.2)
ALK PHOS: 47 IU/L (ref 39–117)
ALT: 24 IU/L (ref 0–32)
AST: 15 IU/L (ref 0–40)
Albumin: 4.6 g/dL (ref 3.5–5.5)
BUN/Creatinine Ratio: 18 (ref 9–23)
BUN: 12 mg/dL (ref 6–24)
Bilirubin Total: 0.3 mg/dL (ref 0.0–1.2)
CALCIUM: 9.6 mg/dL (ref 8.7–10.2)
CO2: 25 mmol/L (ref 20–29)
Chloride: 103 mmol/L (ref 96–106)
Creatinine, Ser: 0.68 mg/dL (ref 0.57–1.00)
GFR calc Af Amer: 119 mL/min/{1.73_m2} (ref >59)
GFR, EST NON AFRICAN AMERICAN: 103 mL/min/{1.73_m2} (ref >59)
GLOBULIN, TOTAL: 2.5 g/dL (ref 1.5–4.5)
Glucose: 93 mg/dL (ref 65–99)
POTASSIUM: 3.9 mmol/L (ref 3.5–5.2)
SODIUM: 143 mmol/L (ref 134–144)
Total Protein: 7.1 g/dL (ref 6.0–8.5)

## 2017-11-17 LAB — CBC WITH DIFFERENTIAL/PLATELET
BASOS: 0 %
Basophils Absolute: 0 10*3/uL (ref 0.0–0.2)
EOS (ABSOLUTE): 0.1 10*3/uL (ref 0.0–0.4)
Eos: 2 %
Hematocrit: 32 % — ABNORMAL LOW (ref 34.0–46.6)
Hemoglobin: 10.2 g/dL — ABNORMAL LOW (ref 11.1–15.9)
IMMATURE GRANS (ABS): 0 10*3/uL (ref 0.0–0.1)
IMMATURE GRANULOCYTES: 0 %
LYMPHS: 55 %
Lymphocytes Absolute: 3.7 10*3/uL — ABNORMAL HIGH (ref 0.7–3.1)
MCH: 23.3 pg — ABNORMAL LOW (ref 26.6–33.0)
MCHC: 31.9 g/dL (ref 31.5–35.7)
MCV: 73 fL — AB (ref 79–97)
MONOS ABS: 0.5 10*3/uL (ref 0.1–0.9)
Monocytes: 7 %
NEUTROS PCT: 36 %
Neutrophils Absolute: 2.4 10*3/uL (ref 1.4–7.0)
PLATELETS: 343 10*3/uL (ref 150–450)
RBC: 4.38 x10E6/uL (ref 3.77–5.28)
RDW: 14.8 % (ref 12.3–15.4)
WBC: 6.8 10*3/uL (ref 3.4–10.8)

## 2017-11-17 LAB — TSH: TSH: 2.1 u[IU]/mL (ref 0.450–4.500)

## 2017-11-17 LAB — HEMOGLOBIN A1C
ESTIMATED AVERAGE GLUCOSE: 117 mg/dL
Hgb A1c MFr Bld: 5.7 % — ABNORMAL HIGH (ref 4.8–5.6)

## 2017-11-17 LAB — VITAMIN D 25 HYDROXY (VIT D DEFICIENCY, FRACTURES): VIT D 25 HYDROXY: 30.4 ng/mL (ref 30.0–100.0)

## 2017-11-19 ENCOUNTER — Other Ambulatory Visit: Payer: Self-pay

## 2017-11-19 ENCOUNTER — Other Ambulatory Visit: Payer: Self-pay | Admitting: Medical

## 2017-11-19 DIAGNOSIS — D649 Anemia, unspecified: Secondary | ICD-10-CM

## 2017-11-19 DIAGNOSIS — Z862 Personal history of diseases of the blood and blood-forming organs and certain disorders involving the immune mechanism: Secondary | ICD-10-CM

## 2017-11-19 MED ORDER — OLMESARTAN-AMLODIPINE-HCTZ 40-5-12.5 MG PO TABS: 1.0000 | ORAL_TABLET | Freq: Every day | ORAL | 3 refills | Status: DC

## 2017-11-19 MED ORDER — POTASSIUM CHLORIDE ER 10 MEQ PO TBCR: 10.0000 meq | EXTENDED_RELEASE_TABLET | Freq: Every day | ORAL | 3 refills | Status: DC

## 2017-11-19 MED ORDER — FERROUS SULFATE 325 (65 FE) MG PO TABS: 325.0000 mg | ORAL_TABLET | Freq: Every day | ORAL | 2 refills | Status: DC

## 2017-11-19 MED ORDER — TOLTERODINE TARTRATE ER 4 MG PO CP24: 4.0000 mg | ORAL_CAPSULE | Freq: Every day | ORAL | 3 refills | Status: DC

## 2017-11-19 MED ORDER — VITAMIN D 25 MCG (1000 UNIT) PO TABS: 1000.0000 [IU] | ORAL_TABLET | Freq: Every day | ORAL | 3 refills | Status: DC

## 2017-12-03 ENCOUNTER — Ambulatory Visit: Payer: Self-pay

## 2017-12-04 ENCOUNTER — Ambulatory Visit: Payer: Self-pay

## 2017-12-07 ENCOUNTER — Other Ambulatory Visit: Payer: Self-pay | Admitting: Medical

## 2017-12-23 ENCOUNTER — Other Ambulatory Visit (INDEPENDENT_AMBULATORY_CARE_PROVIDER_SITE_OTHER): Payer: Self-pay | Admitting: Physician Assistant

## 2017-12-24 NOTE — Telephone Encounter (Signed)
Patient aware.

## 2017-12-24 NOTE — Telephone Encounter (Signed)
Ok to refill 

## 2017-12-31 ENCOUNTER — Other Ambulatory Visit: Payer: Self-pay | Admitting: Medical

## 2017-12-31 ENCOUNTER — Telehealth: Payer: Self-pay

## 2017-12-31 NOTE — Telephone Encounter (Signed)
[facility] is requesting to fill pt doxycycline. Please advise. [place]

## 2017-12-31 NOTE — Telephone Encounter (Signed)
Pt lmom about Doxycycline refill. Also pharmacy has sent a request electronically.

## 2018-01-14 ENCOUNTER — Ambulatory Visit (INDEPENDENT_AMBULATORY_CARE_PROVIDER_SITE_OTHER): Payer: 59 | Admitting: Physician Assistant

## 2018-01-14 ENCOUNTER — Encounter (INDEPENDENT_AMBULATORY_CARE_PROVIDER_SITE_OTHER): Payer: Self-pay | Admitting: Physician Assistant

## 2018-01-14 DIAGNOSIS — M170 Bilateral primary osteoarthritis of knee: Secondary | ICD-10-CM | POA: Diagnosis not present

## 2018-01-14 MED ORDER — METHYLPREDNISOLONE ACETATE 40 MG/ML IJ SUSP
40.0000 mg | INTRAMUSCULAR | Status: AC | PRN
  Administered 2018-01-14: 40 mg via INTRA_ARTICULAR

## 2018-01-14 MED ORDER — LIDOCAINE HCL 1 % IJ SOLN
0.5000 mL | INTRAMUSCULAR | Status: AC | PRN
  Administered 2018-01-14: .5 mL

## 2018-01-14 NOTE — Progress Notes (Signed)
   Procedure Note  Patient: AJEE HEASLEY             Date of Birth: 11/10/1970           MRN: [id number]             Visit Date: 01/14/2018 HPI: [name] returns today due to bilateral knee osteoarthritis.  She was last seen 09/13/2017 was given cortisone injections both knees.  She states her pain is slowly returning.  She got overall good relief with the injections.  She continues to take diclofenac Tylenol for knee pain.  States she only has pain when weightbearing.  No new injury to either knee.  Physical exam: Bilateral knees good range of motion.  No abnormal warmth erythema or effusion.  Procedures: Visit Diagnoses: Primary osteoarthritis of both knees  Large Joint Inj: bilateral knee on 01/14/2018 8:37 AM Indications: pain Details: 22 G 1.5 in needle, anterolateral approach  Arthrogram: No  Medications (Right): 0.5 mL lidocaine 1 %; 40 mg methylPREDNISolone acetate 40 MG/ML Medications (Left): 0.5 mL lidocaine 1 %; 40 mg methylPREDNISolone acetate 40 MG/ML Outcome: tolerated well, no immediate complications Procedure, treatment alternatives, risks and benefits explained, specific risks discussed. Consent was given by the patient. Immediately prior to procedure a time out was called to verify the correct patient, procedure, equipment, support staff and site/side marked as required. Patient was prepped and draped in the usual sterile fashion.     Plan: She will continue to work on quad strengthening.  She will follow-up with [place] on as-needed basis.  She understands to wait 3 months between injections.  Questions were encouraged and answered.

## 2018-01-17 ENCOUNTER — Encounter: Payer: Self-pay | Admitting: Gastroenterology

## 2018-01-17 ENCOUNTER — Ambulatory Visit: Payer: 59 | Admitting: Gastroenterology

## 2018-01-17 VITALS — BP 130/84 | HR 60 | Ht 64.0 in | Wt 221.0 lb

## 2018-01-17 DIAGNOSIS — Z1211 Encounter for screening for malignant neoplasm of colon: Secondary | ICD-10-CM

## 2018-01-17 DIAGNOSIS — D509 Iron deficiency anemia, unspecified: Secondary | ICD-10-CM | POA: Diagnosis not present

## 2018-01-17 MED ORDER — SUPREP BOWEL PREP KIT 17.5-3.13-1.6 GM/177ML PO SOLN: ORAL | 0 refills | Status: DC

## 2018-01-17 NOTE — Progress Notes (Signed)
HPI :  48 y/o female here for a follow up visit for anemia. She has had longstanding anemia, Hgb mostly in 10 range although she reports as low as 7s remotely. She used to have menorrhagia and underwent hysterectomy in 2009. Severity of anemia improved over time but Hgb had not normalized. She's had iron levels drawn a few times previously which has been normal. Since our last visit I had recommended a colonoscopy given she was due for colon cancer screening, but her insurance changed and it was not done. She also had hemoglobin electrophoresis which was normal. She has no FH of esophageal, gastric, or colon cancer. She has ongoing knee pain and has been taking Voltaren daily as well as tylenol. We had discussed avoiding NSAIDs at the last visit. She has no history of PUD. She denies any upper tract symptoms that bother her - minimal reflux, no dysphagia, no nausea / vomiting, no abdominal pains. She denies any blood in her stools. No problems with her bowel habits. Weight is stable. Generally she is feeling well without complaints. She's never had a prior upper endoscopy.  Hgb at 10.2, MCV 73 on 11/8  Colonoscopy 06/11/2007 - Dr. Benson Norway - multiple 47mm rectal polyps c/w hyperplastic polyps, large internal hemorrhoids  Past Medical History:  Diagnosis Date  . Acanthosis nigricans   . Anemia   . Arthritis of knee   . External hemorrhoid   . History of blood transfusion 2009   2 units after hysterectomy  . Hypertension   . Obesity   . Plantar fasciitis of left foot   . Sleep apnea 2010   borderline no cpap used  . Wears glasses      Past Surgical History:  Procedure Laterality Date  . ABDOMINAL HYSTERECTOMY  2009  . CESAREAN SECTION     x 2  . COLONOSCOPY  2009   done for anemia eval; Dr. Benson Norway  . KNEE SURGERY  10/2015   knot removed from R knee  . LAPAROTOMY Left 07/30/2012   Procedure: LEFT SALPINGO OOPHERECTOMY,  OMENTECTOMY, AND LYSIS OF ADHESIONS;  Surgeon: Alvino Chapel,  MD;  Location: [facility];  Service: Gynecology;  Laterality: Left;  . OOPHORECTOMY  2014   left   . removed a cyst on her ovaries  2014   Family History  Problem Relation Age of Onset  . Cancer Mother        liver and lung  . Hypertension Mother   . Diabetes Father   . Kidney failure Father   . Hypertension Brother   . Hypertension Brother   . Hypertension Brother   . Cancer Maternal Aunt        thyroid  . Diabetes Maternal Aunt   . Hypertension Maternal Aunt   . Heart disease Maternal Grandmother   . Diabetes Maternal Uncle   . Hypertension Maternal Uncle   . Stroke Neg Hx    Social History   Tobacco Use  . Smoking status: Former Smoker    Packs/day: 0.25    Years: 4.00    Pack years: 1.00    Types: Cigarettes    Last attempt to quit: 09/29/2011    Years since quitting: 6.3  . Smokeless tobacco: Never Used  Substance Use Topics  . Alcohol use: No    Alcohol/week: 2.0 standard drinks    Types: 2 Shots of liquor per week  . Drug use: No   Current Outpatient Medications  Medication Sig Dispense Refill  . cholecalciferol (  VITAMIN D3) 25 MCG (1000 UT) tablet Take 1 tablet (1,000 Units total) by mouth daily. 90 tablet 3  . diclofenac (VOLTAREN) 75 MG EC tablet TAKE ONE TABLET BY MOUTH TWICE A DAY 60 tablet 2  . doxycycline (VIBRA-TABS) 100 MG tablet TAKE ONE TABLET BY MOUTH TWICE A DAY 20 tablet 0  . ferrous sulfate 325 (65 FE) MG tablet Take 1 tablet (325 mg total) by mouth daily with breakfast. 30 tablet 2  . Multiple Vitamin (MULTIVITAMIN WITH MINERALS) TABS Take 1 tablet by mouth daily.    . Olmesartan-amLODIPine-HCTZ 40-5-12.5 MG TABS Take 1 tablet by mouth daily. 90 tablet 3  . potassium chloride (K-DUR) 10 MEQ tablet Take 1 tablet (10 mEq total) by mouth daily. 90 tablet 3  . tolterodine (DETROL LA) 4 MG 24 hr capsule TAKE ONE CAPSULE BY MOUTH DAILY 30 capsule 0  . TURMERIC PO Take by mouth daily.     No current facility-administered medications for this visit.     Allergies  Allergen Reactions  . Amlodipine     Ankle swelling at 10mg   . Lisinopril     Cough      Review of Systems: All systems reviewed and negative except where noted in HPI.   Lab Results  Component Value Date   WBC 6.8 11/16/2017   HGB 10.2 (L) 11/16/2017   HCT 32.0 (L) 11/16/2017   MCV 73 (L) 11/16/2017   PLT 343 11/16/2017   Lab Results  Component Value Date   CREATININE 0.68 11/16/2017   BUN 12 11/16/2017   NA 143 11/16/2017   K 3.9 11/16/2017   CL 103 11/16/2017   CO2 25 11/16/2017    Lab Results  Component Value Date   ALT 24 11/16/2017   AST 15 11/16/2017   ALKPHOS 47 11/16/2017   BILITOT 0.3 11/16/2017     Physical Exam: BP 130/84   Pulse 60   Ht 5\' 4"  (1.626 m)   Wt 221 lb (100.2 kg)   BMI 37.93 kg/m  Constitutional: Pleasant,well-developed, female in no acute distress. HEENT: Normocephalic and atraumatic. Conjunctivae are normal. No scleral icterus. Neck supple.  Cardiovascular: Normal rate, regular rhythm.  Pulmonary/chest: Effort normal and breath sounds normal. No wheezing, rales or rhonchi. Abdominal: Soft, nondistended, nontender. There are no masses palpable. No hepatomegaly. Extremities: no edema Lymphadenopathy: No cervical adenopathy noted. Neurological: Alert and oriented to person place and time. Skin: Skin is warm and dry. No rashes noted. Psychiatric: Normal mood and affect. Behavior is normal.   ASSESSMENT AND PLAN: 48 year old female here for reassessment following issues:  Microcytic anemia / colon cancer screening - anemia does not appear to be related to iron deficiency although unclear what is driving this process, hemoglobin electrophoresis also negative. Given her ethnicity she is due for colon cancer screening and in light of the anemia she is agreeable to colonoscopy. She was not able to follow through this after her last visit, although now able to proceed now that insurance has been sorted out. She continues  to use NSAIDs on a routine basis, this could cause GI tract blood loss and ulceration although would expect her to have an associated iron deficiency. I offered her an upper endoscopy to ensure okay in light of her ongoing anemia, to be done at the same time as her colonoscopy. Following discussion of risks and benefits she wanted to proceed with both exams. Recommend she avoid taking NSAIDs routinely, as she is not sure how much it helps her  knee pain, would recommend Tylenol as his workup is ongoing. If her endoscopic procedures do not reveal any pathology, may refer her to hematology for further assessment.  Liberty Cellar, MD [facility]

## 2018-01-17 NOTE — Patient Instructions (Signed)
If you are age 50 or older, your body mass index should be between 23-30. Your Body mass index is 37.93 kg/m. If this is out of the aforementioned range listed, please consider follow up with your Primary Care Provider.  If you are age 51 or younger, your body mass index should be between 19-25. Your Body mass index is 37.93 kg/m. If this is out of the aformentioned range listed, please consider follow up with your Primary Care Provider.   You have been scheduled for an endoscopy and colonoscopy. Please follow the written instructions given to you at your visit today. Please pick up your prep supplies at the pharmacy within the next 1-3 days. If you use inhalers (even only as needed), please bring them with you on the day of your procedure. Your physician has requested that you go to www.startemmi.com and enter the access code given to you at your visit today. This web site gives a general overview about your procedure. However, you should still follow specific instructions given to you by our office regarding your preparation for the procedure.  Thank you for entrusting me with your care and for choosing [facility], Dr. Butler Cellar

## 2018-01-18 ENCOUNTER — Telehealth: Payer: Self-pay | Admitting: Medical

## 2018-01-18 ENCOUNTER — Other Ambulatory Visit: Payer: Self-pay

## 2018-01-18 ENCOUNTER — Other Ambulatory Visit: Payer: Self-pay | Admitting: Medical

## 2018-01-18 DIAGNOSIS — D649 Anemia, unspecified: Secondary | ICD-10-CM

## 2018-01-18 NOTE — Telephone Encounter (Signed)
Patient notified and is coming in on Monday for lab work.   Please put in orders.

## 2018-01-18 NOTE — Telephone Encounter (Signed)
Have Kayla Larsen come by for nurse visit to check B12, iron and folate lab.  I spoke to Dr. Havery Moros about this, and we want to rule those things out regarding anemia.  She hasn't had those things checked in a while.

## 2018-01-21 ENCOUNTER — Ambulatory Visit
Admission: RE | Admit: 2018-01-21 | Discharge: 2018-01-21 | Disposition: A | Discharge status: Home / Self Care | Payer: 59 | Source: Ambulatory Visit | Attending: Medical | Admitting: Medical

## 2018-01-21 ENCOUNTER — Other Ambulatory Visit: Payer: 59

## 2018-01-21 DIAGNOSIS — D649 Anemia, unspecified: Secondary | ICD-10-CM

## 2018-01-21 DIAGNOSIS — Z1231 Encounter for screening mammogram for malignant neoplasm of breast: Secondary | ICD-10-CM

## 2018-01-22 ENCOUNTER — Other Ambulatory Visit: Payer: Self-pay | Admitting: Medical

## 2018-01-22 DIAGNOSIS — R928 Other abnormal and inconclusive findings on diagnostic imaging of breast: Secondary | ICD-10-CM

## 2018-01-22 LAB — B12 AND FOLATE PANEL
Folate: >20 ng/mL (ref >3.0)
Vitamin B-12: 579 pg/mL (ref 232–1245)

## 2018-01-22 LAB — IRON: Iron: 65 ug/dL (ref 27–159)

## 2018-01-25 ENCOUNTER — Encounter: Payer: Self-pay | Admitting: Gastroenterology

## 2018-01-25 ENCOUNTER — Ambulatory Visit (AMBULATORY_SURGERY_CENTER): Payer: 59 | Admitting: Gastroenterology

## 2018-01-25 VITALS — BP 128/70 | HR 66 | Temp 98.9°F | Resp 10 | Ht 64.0 in | Wt 221.0 lb

## 2018-01-25 DIAGNOSIS — K529 Noninfective gastroenteritis and colitis, unspecified: Secondary | ICD-10-CM | POA: Diagnosis not present

## 2018-01-25 DIAGNOSIS — K5000 Crohn's disease of small intestine without complications: Secondary | ICD-10-CM

## 2018-01-25 DIAGNOSIS — Z1211 Encounter for screening for malignant neoplasm of colon: Secondary | ICD-10-CM

## 2018-01-25 DIAGNOSIS — D509 Iron deficiency anemia, unspecified: Secondary | ICD-10-CM

## 2018-01-25 HISTORY — PX: COLONOSCOPY: SHX174

## 2018-01-25 MED ORDER — SODIUM CHLORIDE 0.9 % IV SOLN: 500.0000 mL | Freq: Once | INTRAVENOUS | Status: DC

## 2018-01-25 NOTE — Progress Notes (Signed)
Called to room to assist during endoscopic procedure.  Patient ID and intended procedure confirmed with present staff. Received instructions for my participation in the procedure from the performing physician.  

## 2018-01-25 NOTE — Patient Instructions (Signed)
NO NSAIDS  TYLENOL ONLY FOR PAIN    YOU HAD AN ENDOSCOPIC PROCEDURE TODAY AT THE [facility]:   Refer to the procedure report that was given to you for any specific questions about what was found during the examination.  If the procedure report does not answer your questions, please call your gastroenterologist to clarify.  If you requested that your care partner not be given the details of your procedure findings, then the procedure report has been included in a sealed envelope for you to review at your convenience later.  YOU SHOULD EXPECT: Some feelings of bloating in the abdomen. Passage of more gas than usual.  Walking can help get rid of the air that was put into your GI tract during the procedure and reduce the bloating. If you had a lower endoscopy (such as a colonoscopy or flexible sigmoidoscopy) you may notice spotting of blood in your stool or on the toilet paper. If you underwent a bowel prep for your procedure, you may not have a normal bowel movement for a few days.  Please Note:  You might notice some irritation and congestion in your nose or some drainage.  This is from the oxygen used during your procedure.  There is no need for concern and it should clear up in a day or so.  SYMPTOMS TO REPORT IMMEDIATELY:   Following lower endoscopy (colonoscopy or flexible sigmoidoscopy):  Excessive amounts of blood in the stool  Significant tenderness or worsening of abdominal pains  Swelling of the abdomen that is new, acute  Fever of 100F or higher   For urgent or emergent issues, a gastroenterologist can be reached at any hour by calling [phone].   DIET:  We do recommend a small meal at first, but then you may proceed to your regular diet.  Drink plenty of fluids but you should avoid alcoholic beverages for 24 hours.  ACTIVITY:  You should plan to take it easy for the rest of today and you should NOT DRIVE or use heavy machinery until tomorrow (because of the  sedation medicines used during the test).    FOLLOW UP: Our staff will call the number listed on your records the next business day following your procedure to check on you and address any questions or concerns that you may have regarding the information given to you following your procedure. If we do not reach you, we will leave a message.  However, if you are feeling well and you are not experiencing any problems, there is no need to return our call.  We will assume that you have returned to your regular daily activities without incident.  If any biopsies were taken you will be contacted by phone or by letter within the next 1-3 weeks.  Please call us at [phone] if you have not heard about the biopsies in 3 weeks.    SIGNATURES/CONFIDENTIALITY: You and/or your care partner have signed paperwork which will be entered into your electronic medical record.  These signatures attest to the fact that that the information above on your After Visit Summary has been reviewed and is understood.  Full responsibility of the confidentiality of this discharge information lies with you and/or your care-partner.

## 2018-01-25 NOTE — Op Note (Signed)
[facility] Patient Name: Kayla Larsen Procedure Date: 01/25/2018 11:46 AM MRN: [id number] Endoscopist: Remo Lipps P. Havery Moros , MD Age: 48 Referring MD:  Date of Birth: January 12, 1970 Gender: Female Account #: [id number] Procedure:                Colonoscopy Indications:              microcytic anemia of unclear etiology, screening                            for colon cancer, history of NSAID use (Voltaren) -                            of note, patient's insurance did not approve upper                            endoscopy Medicines:                Monitored Anesthesia Care Procedure:                Pre-Anesthesia Assessment:                           - Prior to the procedure, a History and Physical                            was performed, and patient medications and                            allergies were reviewed. The patient's tolerance of                            previous anesthesia was also reviewed. The risks                            and benefits of the procedure and the sedation                            options and risks were discussed with the patient.                            All questions were answered, and informed consent                            was obtained. Prior Anticoagulants: The patient has                            taken no previous anticoagulant or antiplatelet                            agents. ASA Grade Assessment: II - A patient with                            mild systemic disease. After reviewing the risks  and benefits, the patient was deemed in                            satisfactory condition to undergo the procedure.                           After obtaining informed consent, the colonoscope                            was passed under direct vision. Throughout the                            procedure, the patient's blood pressure, pulse, and                            oxygen saturations were monitored  continuously. The                            Model PCF-[id number]) scope was introduced                            through the anus and advanced to the the terminal                            ileum, with identification of the appendiceal                            orifice and IC valve. The colonoscopy was performed                            without difficulty. The patient tolerated the                            procedure well. The quality of the bowel                            preparation was good. The terminal ileum, ileocecal                            valve, appendiceal orifice, and rectum were                            photographed. Scope In: 11:53:46 AM Scope Out: 12:09:40 PM Scope Withdrawal Time: 0 hours 13 minutes 36 seconds  Total Procedure Duration: 0 hours 15 minutes 54 seconds  Findings:                 The perianal and digital rectal examinations were                            normal.                           The terminal ileum contained multiple small apthous  ulcers. Biopsies were taken with a cold forceps for                            histology.                           A few medium-mouthed diverticula were found in the                            sigmoid colon and ascending colon.                           The exam was otherwise without abnormality. No                            colon polyps or inflammatory changes. Complications:            No immediate complications. Estimated blood loss:                            Minimal. Estimated Blood Loss:     Estimated blood loss was minimal. Impression:               - Multiple apthous ulcers in the terminal ileum.                            Biopsied.                           - Diverticulosis in the sigmoid colon and in the                            ascending colon.                           - The examination was otherwise normal.                           NSAIDs could be causing  ileal findings, while                            Crohn's also on differential, it's possible it                            could be related to the anemia. Recommendation:           - Patient has a contact number available for                            emergencies. The signs and symptoms of potential                            delayed complications were discussed with the                            patient. Return to normal activities tomorrow.  Written discharge instructions were provided to the                            patient.                           - Resume previous diet.                           - Continue present medications.                           - Avoid all NSAIDs, use tylenol as needed for pain                           - Await pathology results with further                            recommendations                           - Repeat colonoscopy in 10 years for screening                            purposes Steven P. Armbruster, MD 01/25/2018 12:16:46 PM This report has been signed electronically.

## 2018-01-25 NOTE — Progress Notes (Signed)
PT taken to PACU. Monitors in place. VSS. Report given to RN. 

## 2018-01-28 ENCOUNTER — Ambulatory Visit: Payer: 59

## 2018-01-28 ENCOUNTER — Ambulatory Visit
Admission: RE | Admit: 2018-01-28 | Discharge: 2018-01-28 | Disposition: A | Discharge status: Home / Self Care | Payer: 59 | Source: Ambulatory Visit | Attending: Medical | Admitting: Medical

## 2018-01-28 ENCOUNTER — Telehealth: Payer: Self-pay

## 2018-01-28 DIAGNOSIS — R928 Other abnormal and inconclusive findings on diagnostic imaging of breast: Secondary | ICD-10-CM

## 2018-01-28 NOTE — Telephone Encounter (Signed)
  Follow up Call-  Call back number 01/25/2018  Post procedure Call Back phone  # [phone]  Permission to leave phone message Yes  Some recent data might be hidden     Patient questions:  Do you have a fever, pain , or abdominal swelling? No. Pain Score  0 *  Have you tolerated food without any problems? Yes.    Have you been able to return to your normal activities? Yes.    Do you have any questions about your discharge instructions: Diet   No. Medications  No. Follow up visit  No.  Do you have questions or concerns about your Care? No.  Actions: * If pain score is 4 or above: No action needed, pain <4.

## 2018-01-31 ENCOUNTER — Other Ambulatory Visit: Payer: Self-pay

## 2018-01-31 DIAGNOSIS — D509 Iron deficiency anemia, unspecified: Secondary | ICD-10-CM

## 2018-01-31 NOTE — Progress Notes (Signed)
Cbc due the week of March 2

## 2018-02-13 ENCOUNTER — Ambulatory Visit: Payer: 59 | Admitting: Medical

## 2018-02-13 ENCOUNTER — Encounter: Payer: Self-pay | Admitting: Medical

## 2018-02-13 VITALS — BP 120/76 | HR 62 | Temp 98.3°F | Resp 16 | Ht 64.0 in | Wt 223.0 lb

## 2018-02-13 DIAGNOSIS — S39012A Strain of muscle, fascia and tendon of lower back, initial encounter: Secondary | ICD-10-CM

## 2018-02-13 DIAGNOSIS — M62830 Muscle spasm of back: Secondary | ICD-10-CM

## 2018-02-13 MED ORDER — METHOCARBAMOL 500 MG PO TABS: ORAL_TABLET | ORAL | 0 refills | Status: DC

## 2018-02-13 MED ORDER — DICLOFENAC SODIUM 75 MG PO TBEC: 75.0000 mg | DELAYED_RELEASE_TABLET | Freq: Two times a day (BID) | ORAL | 0 refills | Status: DC

## 2018-02-13 NOTE — Patient Instructions (Addendum)
We discussed possible causes of your lower back pain.   The main findings today on your exam were spasm of left lower back I suspect the cause of your back pain is strain/spasm  Specific recommendations to help your current back pain include: Rest your back for the next 5-7 days Avoid strenuous activity or heavy lifting for the next 5-7 days You may use heat to your back such as a hot shower, moist hot towel, or heat pad 2-3 times per day short-term Consider going to a massage therapist  You can use Diclofenac/Voltaren for pain and inflammation 2 times per day for the next 3-5 days then as needed.  (Caution-I do not intend for you to use anti-inflammatories such as ibuprofen, Aleve, or Advil on a regular basis due to risk of gastric bleeding or damage to the liver or kidney).  This was previously prescribed by Dr. Ninfa Linden, so you may already have a refill at the pharmacy  You may use the muscle relaxer I prescribed to help reduce spasm and tension in your muscles.  Primarily use this as bedtime as it may cause drowsiness.  If you will be home for several hours during the day you may use 1/2 tablet during the day.  Do not drive or operate machinery while taking this medication.  General recommendations to help prevent back problems and to keep your back strong and healthy: I recommend you get 150 minutes of aerobic exercise per week such as walking, running, swimming, dancing, golf or other exercise. I recommend you do a general stretching routine daily. To prevent back issues, I recommend you use some core strengthening exercises as we discussed.  For example, do the following regimen at least 2 days per week:  perform 2-3 sets of 25 in each of abdominal crunches or sit ups  perform 2-3 sets of 25 in each of core twists  perform 2-3 sets of 25 in each of upright rows  perform 2-3 sets of 25 in each of dead lifts  Drink plenty of water throughout the day so that your urine is clear If you  smoke, I strongly recommend you quit smoking as this causes inflammation throughout the body including your back  Please note: If you experience worsening or significantly different symptoms in the near future then call or return immediately If you develop fever, urinary changes, bowel changes, body aches and chills, then get reevaluated immediately by either calling, returning, or going to the urgent care or emergency department after hours or weekend If you have severe back pain compared to today's visit, or numbness of the genitalia, incontinence of bowel or bladder, or develop the inability to walk or stand, then go to the emergency department immediately  Follow up: as needed or if not improving within a week

## 2018-02-13 NOTE — Progress Notes (Signed)
Subjective: Chief Complaint  Patient presents with  . muscle spasms    muscle spasm right side back right leg X Sunday   Here for muscle spasms of back, right side.  This weekend started with pains 4 days ago, but yesterday so bad, could barely walk up stairs.   No recent fall, no injury.    She had colonoscopy last Friday, 5 days ago.   She did carry a large pack of water bottles up stairs this past weekend but think this caused a problem.  Currently has pain in right lumbar/flank.  Pain down right leg.  No numbness, no tingling. No incontinence.  No blood in urine or stool.  No fever.   No abdominal pain.   Took some left over muscle relaxers she had left.  No rash.  Exercise - walkign and at work they do a stretching routine as group nightly.    No other aggravating or relieving factors. No other complaint.   Past Medical History:  Diagnosis Date  . Acanthosis nigricans   . Anemia   . Arthritis of knee   . External hemorrhoid   . History of blood transfusion 2009   2 units after hysterectomy  . Hypertension   . Obesity   . Plantar fasciitis of left foot   . Sleep apnea 2010   borderline no cpap used  . Wears glasses    Current Outpatient Medications on File Prior to Visit  Medication Sig Dispense Refill  . cholecalciferol (VITAMIN D3) 25 MCG (1000 UT) tablet Take 1 tablet (1,000 Units total) by mouth daily. 90 tablet 3  . Cranberry 125 MG TABS Take 2 tablets by mouth daily.    . ferrous sulfate 325 (65 FE) MG tablet Take 1 tablet (325 mg total) by mouth daily with breakfast. 30 tablet 2  . Multiple Vitamin (MULTIVITAMIN WITH MINERALS) TABS Take 1 tablet by mouth daily.    . Olmesartan-amLODIPine-HCTZ 40-5-12.5 MG TABS Take 1 tablet by mouth daily. 90 tablet 3  . potassium chloride (K-DUR) 10 MEQ tablet Take 1 tablet (10 mEq total) by mouth daily. 90 tablet 3  . tolterodine (DETROL LA) 4 MG 24 hr capsule TAKE ONE CAPSULE BY MOUTH DAILY 30 capsule 0  . TURMERIC PO Take by mouth  daily.     No current facility-administered medications on file prior to visit.    Past Surgical History:  Procedure Laterality Date  . ABDOMINAL HYSTERECTOMY  2009  . CESAREAN SECTION     x 2  . COLONOSCOPY  2009   done for anemia eval; Dr. Benson Norway  . KNEE SURGERY  10/2015   knot removed from R knee  . LAPAROTOMY Left 07/30/2012   Procedure: LEFT SALPINGO OOPHERECTOMY,  OMENTECTOMY, AND LYSIS OF ADHESIONS;  Surgeon: Alvino Chapel, MD;  Location: [facility];  Service: Gynecology;  Laterality: Left;  . OOPHORECTOMY  2014   left   . removed a cyst on her ovaries  2014   ROS as in subjective   Objective: BP 120/76   Pulse 62   Temp 98.3 F (36.8 C) (Oral)   Resp 16   Ht 5\' 4"  (1.626 m)   Wt 223 lb (101.2 kg)   SpO2 97%   BMI 38.28 kg/m   Wt Readings from Last 3 Encounters:  02/13/18 223 lb (101.2 kg)  01/25/18 221 lb (100.2 kg)  01/17/18 221 lb (100.2 kg)   Gen: wd, wn, nad Skin: unremarkable Back: +spasm right lumbar paraspinal region, mild  pain with extension, otherwise full flexion without pain, no other tenderness, no deformity Legs non tender, Norma hip ROM, no deformity Legs neurovascularly intact    Assessment: Encounter Diagnoses  Name Primary?  . Back spasm Yes  . Back strain, initial encounter      Plan: We discussed possible causes of your lower back pain.   The main findings today on your exam were spasm of left lower back I suspect the cause of your back pain is strain/spasm  Specific recommendations to help your current back pain include: Rest your back for the next 5-7 days Avoid strenuous activity or heavy lifting for the next 5-7 days You may use heat to your back such as a hot shower, moist hot towel, or heat pad 2-3 times per day short-term Consider going to a massage therapist  You can use Diclofenac/Voltaren for pain and inflammation 2 times per day for the next 3-5 days then as needed.  (Caution-I do not intend for you to use  anti-inflammatories such as ibuprofen, Aleve, or Advil on a regular basis due to risk of gastric bleeding or damage to the liver or kidney).  This was previously prescribed by Dr. Ninfa Linden, so you may already have a refill at the pharmacy  You may use the muscle relaxer I prescribed to help reduce spasm and tension in your muscles.  Primarily use this as bedtime as it may cause drowsiness.  If you will be home for several hours during the day you may use 1/2 tablet during the day.  Do not drive or operate machinery while taking this medication.  General recommendations to help prevent back problems and to keep your back strong and healthy: I recommend you get 150 minutes of aerobic exercise per week such as walking, running, swimming, dancing, golf or other exercise. I recommend you do a general stretching routine daily. To prevent back issues, I recommend you use some core strengthening exercises as we discussed.  For example, do the following regimen at least 2 days per week:  perform 2-3 sets of 25 in each of abdominal crunches or sit ups  perform 2-3 sets of 25 in each of core twists  perform 2-3 sets of 25 in each of upright rows  perform 2-3 sets of 25 in each of dead lifts  Drink plenty of water throughout the day so that your urine is clear If you smoke, I strongly recommend you quit smoking as this causes inflammation throughout the body including your back  Please note: If you experience worsening or significantly different symptoms in the near future then call or return immediately If you develop fever, urinary changes, bowel changes, body aches and chills, then get reevaluated immediately by either calling, returning, or going to the urgent care or emergency department after hours or weekend If you have severe back pain compared to today's visit, or numbness of the genitalia, incontinence of bowel or bladder, or develop the inability to walk or stand, then go to the emergency  department immediately  Follow up: as needed or if not improving within a week  Kayla Larsen was seen today for muscle spasms.  Diagnoses and all orders for this visit:  Back spasm  Back strain, initial encounter  Other orders -     diclofenac (VOLTAREN) 75 MG EC tablet; Take 1 tablet (75 mg total) by mouth 2 (two) times daily. -     methocarbamol (ROBAXIN) 500 MG tablet; TAKE 1 TABLET(500 MG) BY MOUTH AT BEDTIME

## 2018-03-05 ENCOUNTER — Ambulatory Visit: Payer: 59 | Admitting: Medical

## 2018-03-05 ENCOUNTER — Encounter: Payer: Self-pay | Admitting: Medical

## 2018-03-05 VITALS — BP 120/74 | HR 90 | Temp 100.9°F | Resp 16 | Ht 64.0 in | Wt 219.6 lb

## 2018-03-05 DIAGNOSIS — R112 Nausea with vomiting, unspecified: Secondary | ICD-10-CM | POA: Diagnosis not present

## 2018-03-05 DIAGNOSIS — R05 Cough: Secondary | ICD-10-CM | POA: Diagnosis not present

## 2018-03-05 DIAGNOSIS — R6889 Other general symptoms and signs: Secondary | ICD-10-CM | POA: Diagnosis not present

## 2018-03-05 DIAGNOSIS — J111 Influenza due to unidentified influenza virus with other respiratory manifestations: Secondary | ICD-10-CM

## 2018-03-05 DIAGNOSIS — R059 Cough, unspecified: Secondary | ICD-10-CM

## 2018-03-05 LAB — POCT INFLUENZA A/B
Influenza A, POC: POSITIVE — AB
Influenza B, POC: NEGATIVE

## 2018-03-05 MED ORDER — PROMETHAZINE HCL 25 MG/ML IJ SOLN
25.0000 mg | Freq: Once | INTRAMUSCULAR | Status: AC
  Administered 2018-03-05: 25 mg via INTRAMUSCULAR

## 2018-03-05 MED ORDER — ONDANSETRON HCL 4 MG PO TABS: 4.0000 mg | ORAL_TABLET | Freq: Three times a day (TID) | ORAL | 0 refills | Status: DC | PRN

## 2018-03-05 MED ORDER — OSELTAMIVIR PHOSPHATE 75 MG PO CAPS: 75.0000 mg | ORAL_CAPSULE | Freq: Two times a day (BID) | ORAL | 0 refills | Status: DC

## 2018-03-05 NOTE — Patient Instructions (Signed)
Recommendations: You tested + for the flu/influenza   Begin Tamiflu twice daily for 5 days to help cut down on severity of the illness  Go for chest xray at your convenience, but not driving yourself since we gave you a shot of phenergan today for nausea.  You will likely be sleepy the next 2-3 hours.  For the next 24-48 hours, temporarily stop your potassium and blood pressure medication until you are hydrated up again  Drink clear fluids throughout the day   Specific home care recommendations today include:  Only take over-the-counter (OTC) or prescription medicines for pain, discomfort, or fever as directed by your caregiver.    Decongestant: You may use OTC Guaifenesin (Mucinex plain) for congestion.  You may use Pseudoephedrine (Sudafed) only if you don't have blood pressure problems or a diagnosis of hypertension.  Cough suppression: If you have cough from drainage, you may use over-the-counter Dextromethorphan (Delsym) as directed on the label  Sore throat remedies:  You may use salt water gargles, warm fluids such as coffee or hot tea, or honey/tea/lemon mixture to sooth sore throat pain.  You may use OTC sore throat remedies such as Cepacol lozenges or Chloraseptic spray for sore throat pain.  Runny nose and sneezing remedies: You may use OTC antihistamine such as Zyrtec or Benadryl, but caution as these can cause drowsiness.    Pain/fever relief: You may use over-the-counter Tylenol for pain or fever  Drink extra fluids. Fluids help thin the mucus so your sinuses can drain more easily.   Applying either moist heat or ice packs to the sinus areas may help relieve discomfort.  Use saline nasal sprays to help moisten your sinuses. The sprays can be found at your local drugstore.    Please call or return if worse or not improving in the next few days.    Medication costs:  If you get to the pharmacy and medication prescribed today was either too expensive, not covered by  your insurance, or required prior authorization, then please call us back to let us know.  We often have no way to know if a medication is too expensive or not covered by your insurance.  Thanks for your cooperation.   No follow-ups on file.    I have included other useful information below for your review.   Influenza, Adult Influenza ("the flu") is a viral infection of the respiratory tract. It causes chills, fever, cough, headache, body aches, and sore throat. Influenza in general will make you feel sicker than when you have a common cold. Symptoms of the illness typically last a few days. Cough and fatigue may continue for as long as 7 to 10 days. Influenza is highly contagious. It spreads easily to others in the droplets from coughs and sneezes. People frequently become infected by touching something that was recently contaminated with the virus and then touch their mouth, nose or eyes. This infection is caused by a virus. Symptoms will not be reduced or improved by taking an antibiotic. Antibiotics are medications that kill bacteria, not viruses. DIAGNOSIS  Diagnosis of influenza is often made based on the history and physical examination as well as the presence of influenza reports occurring in your community. Testing can be done if the diagnosis is not certain. TREATMENT  Since influenza is caused by a virus, antibiotics are not helpful. Your caregiver may prescribe antiviral medicines to shorten the illness and lessen the severity. Your caregiver may also recommend influenza vaccination and/or antiviral medicines for  your family members in order to prevent the spread of influenza to them. HOME CARE INSTRUCTIONS  DO NOT GIVE ASPIRIN TO PERSONS WITH INFLUENZA WHO ARE UNDER 58 YEARS OF AGE. This could lead to brain and liver damage (Reye's syndrome). Read the label on over-the-counter medicines.   Stay home from work or school if at all possible until most of your symptoms are gone.   Only  take over-the-counter or prescription medicines for pain, discomfort, or fever as directed by your caregiver.   Use a cool mist humidifier to increase air moisture. This will make breathing easier.   Rest until your temperature is nearly normal: 98.6 F (37 C). This usually takes 3 to 4 days. Be sure you get plenty of rest.   Drink at least eight, eight-ounce glasses of fluids per day. Fluids include water, juice, broth, gelatin, or lemonade.   Cover your mouth and nose when coughing or sneezing and wash your hands often to prevent the spread of this virus to other persons.  PREVENTION  Annual influenza vaccination (flu shots) is the best way to avoid getting influenza. An annual flu shot is now routinely recommended for all adults in the [place] IF:   You develop shortness of breath while resting.   You have a deep cough with production of mucous or chest pain.   You develop nausea (feeling sick to your stomach), vomiting, or diarrhea.  SEEK IMMEDIATE MEDICAL CARE IF:   You have difficulty breathing, become short of breath, or your skin or nails turn bluish.   You develop severe neck pain or stiffness.   You develop a severe headache, facial pain, or earache.   You have a fever.   You develop nausea or vomiting that cannot be controlled.  Document Released: 12/24/1999 Document Revised: 09/07/2010 Document Reviewed: 10/28/2008 [facility] Patient Information 2012 [place].

## 2018-03-05 NOTE — Progress Notes (Signed)
Subjective: Chief Complaint  Patient presents with  . vomit    vomiting, fever, cough, diarrhea X 3 days   She reports cough, some loose stools, fever, chills, body aches, sore throat.   No headache,  Some ear pain.   Fever is subjective.  Hasn't gotten out of the bed since coming home from work the day before.  Started feeling bad Sunday night 1.25 days ago.  Has had some nausea, vomited several times yesterday  Tried to take some cold medication yesterday but vomited it up.   No sick contacts.   No other aggravating or relieving factors. No other complaint.  The following portions of the patient's history were reviewed and updated as appropriate: allergies, current medications, past medical history, past social history and problem list.  ROS as in subjective   Past Medical History:  Diagnosis Date  . Acanthosis nigricans   . Anemia   . Arthritis of knee   . External hemorrhoid   . History of blood transfusion 2009   2 units after hysterectomy  . Hypertension   . Obesity   . Plantar fasciitis of left foot   . Sleep apnea 2010   borderline no cpap used  . Wears glasses    Current Outpatient Medications on File Prior to Visit  Medication Sig Dispense Refill  . cholecalciferol (VITAMIN D3) 25 MCG (1000 UT) tablet Take 1 tablet (1,000 Units total) by mouth daily. 90 tablet 3  . Cranberry 125 MG TABS Take 2 tablets by mouth daily.    . diclofenac (VOLTAREN) 75 MG EC tablet Take 1 tablet (75 mg total) by mouth 2 (two) times daily. 60 tablet 0  . ferrous sulfate 325 (65 FE) MG tablet Take 1 tablet (325 mg total) by mouth daily with breakfast. 30 tablet 2  . methocarbamol (ROBAXIN) 500 MG tablet TAKE 1 TABLET(500 MG) BY MOUTH AT BEDTIME 15 tablet 0  . Multiple Vitamin (MULTIVITAMIN WITH MINERALS) TABS Take 1 tablet by mouth daily.    . Olmesartan-amLODIPine-HCTZ 40-5-12.5 MG TABS Take 1 tablet by mouth daily. 90 tablet 3  . potassium chloride (K-DUR) 10 MEQ tablet Take 1 tablet (10 mEq  total) by mouth daily. 90 tablet 3  . tolterodine (DETROL LA) 4 MG 24 hr capsule TAKE ONE CAPSULE BY MOUTH DAILY 30 capsule 0  . TURMERIC PO Take by mouth daily.     No current facility-administered medications on file prior to visit.    ROS as in subjective   Objective: BP 120/74   Pulse 90   Temp (!) 100.9 F (38.3 C) (Oral)   Resp 16   Ht 5\' 4"  (1.626 m)   Wt 219 lb 9.6 oz (99.6 kg)   SpO2 95%   BMI 37.69 kg/m   Wt Readings from Last 3 Encounters:  03/05/18 219 lb 9.6 oz (99.6 kg)  02/13/18 223 lb (101.2 kg)  01/25/18 221 lb (100.2 kg)   General: Ill-appearing, well-developed, well-nourished Skin: Hot, dry HEENT: Nose inflamed and congested, clear conjunctiva, TMs pearly, no sinus tenderness, pharynx with erythema, no exudates Neck: Supple, non tender, shotty cervical adenopathy Heart: Regular rate and rhythm, normal S1, S2, no murmurs Lungs: decreased breath sounds, no wheezes, rales, rhonchi Abdomen: Non tender non distended Extremities: Mild generalized tenderness   Assessment: Encounter Diagnoses  Name Primary?  . Influenza Yes  . Flu-like symptoms   . Cough   . Nausea and vomiting, intractability of vomiting not specified, unspecified vomiting type  Plan: Prescription given for Tamiflu, discussed risks/benefits of medication.    Discussed diagnosis of influenza. Discussed supportive care including rest, hydration, OTC Tylenol or NSAID for fever, aches, and malaise.  Discussed period of contagion, self quarantine at home away from others to avoid spread of disease, discussed means of transmission, and possible complications including pneumonia.  If worse or not improving within the next 4-5 days, then call or return.  Patient voiced understanding of diagnosis, recommendations, and treatment plan.  After visit summary given.  Gave note for work.  Go for chest xray, but advised her son take her since we gave IM injection of Promethazine in office  today.  Taneil was seen today for vomit.  Diagnoses and all orders for this visit:  Influenza -     DG Chest 2 View; Future  Flu-like symptoms -     Influenza A/B -     DG Chest 2 View; Future  Cough -     DG Chest 2 View; Future  Nausea and vomiting, intractability of vomiting not specified, unspecified vomiting type -     promethazine (PHENERGAN) injection 25 mg  Other orders -     oseltamivir (TAMIFLU) 75 MG capsule; Take 1 capsule (75 mg total) by mouth 2 (two) times daily. -     ondansetron (ZOFRAN) 4 MG tablet; Take 1 tablet (4 mg total) by mouth every 8 (eight) hours as needed for nausea or vomiting.

## 2018-03-06 ENCOUNTER — Telehealth: Payer: Self-pay

## 2018-03-06 ENCOUNTER — Ambulatory Visit
Admission: RE | Admit: 2018-03-06 | Discharge: 2018-03-06 | Disposition: A | Discharge status: Home / Self Care | Payer: 59 | Source: Ambulatory Visit | Attending: Medical | Admitting: Medical

## 2018-03-06 DIAGNOSIS — R6889 Other general symptoms and signs: Secondary | ICD-10-CM

## 2018-03-06 DIAGNOSIS — J111 Influenza due to unidentified influenza virus with other respiratory manifestations: Secondary | ICD-10-CM

## 2018-03-06 DIAGNOSIS — R059 Cough, unspecified: Secondary | ICD-10-CM

## 2018-03-06 DIAGNOSIS — R05 Cough: Secondary | ICD-10-CM

## 2018-03-06 NOTE — Telephone Encounter (Signed)
That is fine 

## 2018-03-06 NOTE — Telephone Encounter (Signed)
Patient called stating that she was seen on yesterday and given a work note but the note has her out 03/05/18-03/08/18. She works 3rd shift and did not make it to work on the night of 03/04/18 and she wants to know if we can write her another note that puts her out 03/04/18-03/08/18. Please advise

## 2018-03-11 ENCOUNTER — Telehealth: Payer: Self-pay

## 2018-03-11 NOTE — Telephone Encounter (Signed)
-----   Message from Roetta Sessions, [place] sent at 01/31/2018  9:33 AM EST ----- Regarding: cbc the week of March 2 Pt due for repeat CBC for anemia

## 2018-03-11 NOTE — Telephone Encounter (Signed)
Called and LM for pt to go to the lab for cbc for anemia. Sent letter

## 2018-03-12 NOTE — Telephone Encounter (Signed)
Done

## 2018-03-18 ENCOUNTER — Other Ambulatory Visit (INDEPENDENT_AMBULATORY_CARE_PROVIDER_SITE_OTHER): Payer: 59

## 2018-03-18 DIAGNOSIS — D509 Iron deficiency anemia, unspecified: Secondary | ICD-10-CM | POA: Diagnosis not present

## 2018-03-18 LAB — CBC WITH DIFFERENTIAL/PLATELET
Basophils Absolute: 0 10*3/uL (ref 0.0–0.1)
Basophils Relative: 0.3 % (ref 0.0–3.0)
Eosinophils Absolute: 0.1 10*3/uL (ref 0.0–0.7)
Eosinophils Relative: 1 % (ref 0.0–5.0)
HCT: 31.6 % — ABNORMAL LOW (ref 36.0–46.0)
Hemoglobin: 10.3 g/dL — ABNORMAL LOW (ref 12.0–15.0)
Lymphocytes Relative: 53.2 % — ABNORMAL HIGH (ref 12.0–46.0)
Lymphs Abs: 3.6 10*3/uL (ref 0.7–4.0)
MCHC: 32.6 g/dL (ref 30.0–36.0)
MCV: 72.4 fl — ABNORMAL LOW (ref 78.0–100.0)
Monocytes Absolute: 0.7 10*3/uL (ref 0.1–1.0)
Monocytes Relative: 10.6 % (ref 3.0–12.0)
Neutro Abs: 2.4 10*3/uL (ref 1.4–7.7)
Neutrophils Relative %: 34.9 % — ABNORMAL LOW (ref 43.0–77.0)
Platelets: 402 10*3/uL — ABNORMAL HIGH (ref 150.0–400.0)
RBC: 4.36 Mil/uL (ref 3.87–5.11)
RDW: 14.8 % (ref 11.5–15.5)
WBC: 6.8 10*3/uL (ref 4.0–10.5)

## 2018-03-28 ENCOUNTER — Other Ambulatory Visit: Payer: Self-pay | Admitting: Medical

## 2018-03-28 NOTE — Telephone Encounter (Signed)
Is this okay to refill or does this need to come pt's GI doctor

## 2018-03-28 NOTE — Telephone Encounter (Signed)
Looks like shane discountinued this med back in [place]

## 2018-04-11 ENCOUNTER — Other Ambulatory Visit: Payer: Self-pay

## 2018-04-11 ENCOUNTER — Encounter (INDEPENDENT_AMBULATORY_CARE_PROVIDER_SITE_OTHER): Payer: Self-pay | Admitting: Orthopaedic Surgery

## 2018-04-11 ENCOUNTER — Ambulatory Visit (INDEPENDENT_AMBULATORY_CARE_PROVIDER_SITE_OTHER): Payer: 59 | Admitting: Orthopaedic Surgery

## 2018-04-11 DIAGNOSIS — M1712 Unilateral primary osteoarthritis, left knee: Secondary | ICD-10-CM | POA: Diagnosis not present

## 2018-04-11 DIAGNOSIS — M1711 Unilateral primary osteoarthritis, right knee: Secondary | ICD-10-CM

## 2018-04-11 MED ORDER — METHYLPREDNISOLONE ACETATE 40 MG/ML IJ SUSP
40.0000 mg | INTRAMUSCULAR | Status: AC | PRN
  Administered 2018-04-11: 40 mg via INTRA_ARTICULAR

## 2018-04-11 MED ORDER — LIDOCAINE HCL 1 % IJ SOLN
0.5000 mL | INTRAMUSCULAR | Status: AC | PRN
  Administered 2018-04-11: 13:00:00 .5 mL

## 2018-04-11 MED ORDER — METHYLPREDNISOLONE ACETATE 40 MG/ML IJ SUSP
40.0000 mg | INTRAMUSCULAR | Status: AC | PRN
  Administered 2018-04-11: 13:00:00 40 mg via INTRA_ARTICULAR

## 2018-04-11 NOTE — Progress Notes (Signed)
   Procedure Note  Patient: Kayla Larsen             Date of Birth: October 19, 1970           MRN: [id number]             Visit Date: 04/11/2018  HPI: Ms. Lindahl comes in today requesting injections in both knees.  She states that the injections given 01/14/2018 helped but not as much in the left knee as the right.  She denies any fevers or chills.  Physical exam: Bilateral knees no effusion at warmth erythema.  She ambulates without any assistive device and has good range of motion both knees.   Procedures: Visit Diagnoses: No diagnosis found.  Large Joint Inj: bilateral knee on 04/11/2018 1:29 PM Indications: pain Details: 22 G 1.5 in needle, anterolateral approach  Arthrogram: No  Medications (Right): 0.5 mL lidocaine 1 %; 40 mg methylPREDNISolone acetate 40 MG/ML Medications (Left): 0.5 mL lidocaine 1 %; 40 mg methylPREDNISolone acetate 40 MG/ML Outcome: tolerated well, no immediate complications Procedure, treatment alternatives, risks and benefits explained, specific risks discussed. Consent was given by the patient. Immediately prior to procedure a time out was called to verify the correct patient, procedure, equipment, support staff and site/side marked as required. Patient was prepped and draped in the usual sterile fashion.    Plan: Follow-up on an as-needed basis.  She understands that she needs to wait 3 months between injections.  Questions encouraged and answered.

## 2018-08-18 ENCOUNTER — Other Ambulatory Visit (INDEPENDENT_AMBULATORY_CARE_PROVIDER_SITE_OTHER): Payer: Self-pay | Admitting: Orthopaedic Surgery

## 2018-09-18 ENCOUNTER — Other Ambulatory Visit: Payer: Self-pay

## 2018-09-18 ENCOUNTER — Other Ambulatory Visit (INDEPENDENT_AMBULATORY_CARE_PROVIDER_SITE_OTHER): Payer: 59

## 2018-09-18 DIAGNOSIS — Z23 Encounter for immunization: Secondary | ICD-10-CM

## 2018-10-10 ENCOUNTER — Ambulatory Visit (INDEPENDENT_AMBULATORY_CARE_PROVIDER_SITE_OTHER): Payer: 59 | Admitting: Physician Assistant

## 2018-10-10 ENCOUNTER — Encounter: Payer: Self-pay | Admitting: Physician Assistant

## 2018-10-10 DIAGNOSIS — M1712 Unilateral primary osteoarthritis, left knee: Secondary | ICD-10-CM | POA: Diagnosis not present

## 2018-10-10 DIAGNOSIS — M1711 Unilateral primary osteoarthritis, right knee: Secondary | ICD-10-CM | POA: Diagnosis not present

## 2018-10-10 MED ORDER — LIDOCAINE HCL 1 % IJ SOLN
0.5000 mL | INTRAMUSCULAR | Status: AC | PRN
  Administered 2018-10-10: .5 mL

## 2018-10-10 MED ORDER — METHYLPREDNISOLONE ACETATE 40 MG/ML IJ SUSP
40.0000 mg | INTRAMUSCULAR | Status: AC | PRN
  Administered 2018-10-10: 15:00:00 40 mg via INTRA_ARTICULAR

## 2018-10-10 MED ORDER — LIDOCAINE HCL 1 % IJ SOLN
0.5000 mL | INTRAMUSCULAR | Status: AC | PRN
  Administered 2018-10-10: 15:00:00 .5 mL

## 2018-10-10 NOTE — Progress Notes (Addendum)
   Procedure Note  Patient: Kayla Larsen             Date of Birth: 06-Nov-1970           MRN: [id number]             Visit Date: 10/10/2018  HPI [name] comes in today requesting injections both knees.  She last had injections both knees 04/11/2018.  States they last until last night she started having pain in both knees.  She has known arthritis in both knees.  She had no new injury.  She is requesting injection both knees.  Bilateral knees good range of motion.  No abnormal warmth erythema or effusion. Procedures: Visit Diagnoses:  1. Unilateral primary osteoarthritis, right knee   2. Primary osteoarthritis of left knee     Large Joint Inj: bilateral knee on 10/10/2018 3:07 PM Indications: pain Details: 22 G 1.5 in needle, anterolateral approach  Arthrogram: No  Medications (Right): 0.5 mL lidocaine 1 %; 40 mg methylPREDNISolone acetate 40 MG/ML Medications (Left): 0.5 mL lidocaine 1 %; 40 mg methylPREDNISolone acetate 40 MG/ML Outcome: tolerated well, no immediate complications Procedure, treatment alternatives, risks and benefits explained, specific risks discussed. Consent was given by the patient. Immediately prior to procedure a time out was called to verify the correct patient, procedure, equipment, support staff and site/side marked as required. Patient was prepped and draped in the usual sterile fashion.     Plan she will follow-up with [place] on as-needed basis.  She understands she needs to wait at least 3 months between injections.  Questions encouraged and answered

## 2018-10-14 ENCOUNTER — Ambulatory Visit: Payer: 59 | Admitting: Physician Assistant

## 2018-11-19 ENCOUNTER — Encounter: Payer: Self-pay | Admitting: Medical

## 2018-11-19 ENCOUNTER — Other Ambulatory Visit: Payer: Self-pay

## 2018-11-19 ENCOUNTER — Ambulatory Visit (INDEPENDENT_AMBULATORY_CARE_PROVIDER_SITE_OTHER): Payer: 59 | Admitting: Medical

## 2018-11-19 VITALS — BP 132/86 | HR 62 | Temp 98.0°F | Ht 64.0 in | Wt 228.8 lb

## 2018-11-19 DIAGNOSIS — Z0000 Encounter for general adult medical examination without abnormal findings: Secondary | ICD-10-CM

## 2018-11-19 DIAGNOSIS — I10 Essential (primary) hypertension: Secondary | ICD-10-CM

## 2018-11-19 DIAGNOSIS — M1990 Unspecified osteoarthritis, unspecified site: Secondary | ICD-10-CM

## 2018-11-19 DIAGNOSIS — D649 Anemia, unspecified: Secondary | ICD-10-CM

## 2018-11-19 DIAGNOSIS — L83 Acanthosis nigricans: Secondary | ICD-10-CM

## 2018-11-19 DIAGNOSIS — E559 Vitamin D deficiency, unspecified: Secondary | ICD-10-CM | POA: Diagnosis not present

## 2018-11-19 DIAGNOSIS — N3281 Overactive bladder: Secondary | ICD-10-CM

## 2018-11-19 DIAGNOSIS — E781 Pure hyperglyceridemia: Secondary | ICD-10-CM

## 2018-11-19 DIAGNOSIS — R7301 Impaired fasting glucose: Secondary | ICD-10-CM

## 2018-11-19 DIAGNOSIS — K633 Ulcer of intestine: Secondary | ICD-10-CM

## 2018-11-19 DIAGNOSIS — E049 Nontoxic goiter, unspecified: Secondary | ICD-10-CM

## 2018-11-19 DIAGNOSIS — K5790 Diverticulosis of intestine, part unspecified, without perforation or abscess without bleeding: Secondary | ICD-10-CM

## 2018-11-19 DIAGNOSIS — Z9889 Other specified postprocedural states: Secondary | ICD-10-CM

## 2018-11-19 NOTE — Progress Notes (Signed)
Subjective:   HPI  Kayla Larsen is a 48 y.o. female who presents for Chief Complaint  Patient presents with  . Annual Exam    with fasting labs     Patient Care Team: Clovia Reine, Camelia Eng, PA-C as PCP - General (Family Medicine) Sees dentist Sees eye doctor GI, Dr. [facility], gynecology   Concerns: Doing fine in general.  Back in the early part of the year is Covid and being at home she started stress eating, started eating ice cream and drinking soda.  Recently saw gynecology, had recent repeat on hemoglobin which was 12.  Much improved.  Feels fine otherwise.   Reviewed their medical, surgical, family, social, medication, and allergy history and updated chart as appropriate.  Past Medical History:  Diagnosis Date  . Acanthosis nigricans   . Anemia   . Arthritis of knee   . External hemorrhoid   . History of blood transfusion 2009   2 units after hysterectomy  . Hypertension   . Obesity   . Plantar fasciitis of left foot   . Sleep apnea 2010   borderline no cpap used  . Wears glasses     Past Surgical History:  Procedure Laterality Date  . ABDOMINAL HYSTERECTOMY  2009  . CESAREAN SECTION     x 2  . COLONOSCOPY  2009   done for anemia eval; Dr. Benson [place]  . COLONOSCOPY  01/25/2018   multiple aphthous ulcers in terminal ileum likely d/t NSAIDs, diverticulosis in sigmoid colon, Dr. [place] Cellar  . KNEE SURGERY  10/2015   knot removed from R knee  . LAPAROTOMY Left 07/30/2012   Procedure: LEFT SALPINGO OOPHERECTOMY,  OMENTECTOMY, AND LYSIS OF ADHESIONS;  Surgeon: Alvino Chapel, MD;  Location: [facility];  Service: Gynecology;  Laterality: Left;  . OOPHORECTOMY  2014   left   . removed a cyst on her ovaries  2014    Social History   Socioeconomic History  . Marital status: Single    Spouse name: Not on file  . Number of children: 2  . Years of education: Not on file  . Highest education level: Not on file   Occupational History  . Not on file  Social Needs  . Financial resource strain: Not on file  . Food insecurity    Worry: Not on file    Inability: Not on file  . Transportation needs    Medical: Not on file    Non-medical: Not on file  Tobacco Use  . Smoking status: Former Smoker    Packs/day: 0.25    Years: 4.00    Pack years: 1.00    Types: Cigarettes    Quit date: 09/29/2011    Years since quitting: 7.1  . Smokeless tobacco: Never Used  Substance and Sexual Activity  . Alcohol use: No    Alcohol/week: 2.0 standard drinks    Types: 2 Shots of liquor per week  . Drug use: No  . Sexual activity: Not Currently  Lifestyle  . Physical activity    Days per week: Not on file    Minutes per session: Not on file  . Stress: Not on file  Relationships  . Social Herbalist on phone: Not on file    Gets together: Not on file    Attends religious service: Not on file    Active member of club or organization: Not on file    Attends meetings of clubs or organizations:  Not on file    Relationship status: Not on file  . Intimate partner violence    Fear of current or ex partner: Not on file    Emotionally abused: Not on file    Physically abused: Not on file    Forced sexual activity: Not on file  Other Topics Concern  . 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   Goes to the gym some.  Works 3rd shift.   11/2018    Family History  Problem Relation Age of Onset  . Cancer Mother        liver and lung  . Hypertension Mother   . Diabetes Father   . Kidney failure Father   . Hypertension Brother   . Hypertension Brother   . Hypertension Brother   . Cancer Maternal Aunt        thyroid  . Diabetes Maternal Aunt   . Hypertension Maternal Aunt   . Heart disease Maternal Grandmother   . Diabetes Maternal Uncle   . Hypertension Maternal Uncle   . Stroke Neg Hx      Current Outpatient Medications:   .  cholecalciferol (VITAMIN D3) 25 MCG (1000 UT) tablet, Take 1 tablet (1,000 Units total) by mouth daily., Disp: 90 tablet, Rfl: 3 .  Cranberry 125 MG TABS, Take 2 tablets by mouth daily., Disp: , Rfl:  .  diclofenac (VOLTAREN) 75 MG EC tablet, TAKE ONE TABLET BY MOUTH TWICE A DAY, Disp: 60 tablet, Rfl: 1 .  FEROSUL 325 (65 Fe) MG tablet, TAKE 1 TABLET(325 MG) BY MOUTH DAILY WITH BREAKFAST, Disp: 30 tablet, Rfl: 2 .  Multiple Vitamin (MULTIVITAMIN WITH MINERALS) TABS, Take 1 tablet by mouth daily., Disp: , Rfl:  .  Olmesartan-amLODIPine-HCTZ 40-5-12.5 MG TABS, Take 1 tablet by mouth daily., Disp: 90 tablet, Rfl: 3 .  potassium chloride (K-DUR) 10 MEQ tablet, Take 1 tablet (10 mEq total) by mouth daily., Disp: 90 tablet, Rfl: 3 .  tolterodine (DETROL LA) 4 MG 24 hr capsule, TAKE ONE CAPSULE BY MOUTH DAILY, Disp: 30 capsule, Rfl: 0 .  TURMERIC PO, Take by mouth daily., Disp: , Rfl:   Allergies  Allergen Reactions  . Amlodipine     Ankle swelling at 10mg   . Lisinopril     Cough      Review of Systems Constitutional: -fever, -chills, -sweats, -unexpected weight change, -decreased appetite, -fatigue Allergy: -sneezing, -itching, -congestion Dermatology: -changing moles, --rash, -lumps ENT: -runny nose, -ear pain, -sore throat, -hoarseness, -sinus pain, -teeth pain, - ringing in ears, -hearing loss, -nosebleeds Cardiology: -chest pain, -palpitations, -swelling, -difficulty breathing when lying flat, -waking up short of breath Respiratory: -cough, -shortness of breath, -difficulty breathing with exercise or exertion, -wheezing, -coughing up blood Gastroenterology: -abdominal pain, -nausea, -vomiting, -diarrhea, -constipation, -blood in stool, -changes in bowel movement, -difficulty swallowing or eating Hematology: -bleeding, -bruising  Musculoskeletal: -joint aches, -muscle aches, -joint swelling, -back pain, -neck pain, -cramping, -changes in gait Ophthalmology: denies vision changes, eye  redness, itching, discharge Urology: -burning with urination, -difficulty urinating, -blood in urine, -urinary frequency, -urgency, -incontinence Neurology: -headache, -weakness, -tingling, -numbness, -memory loss, -falls, -dizziness Psychology: -depressed mood, -agitation, -sleep problems Breast/gyn: -breast tendnerss, -discharge, -lumps, -vaginal discharge,- irregular periods, -heavy periods     Objective:  BP 132/86   Pulse 62   Temp 98 F (36.7 C)   Ht 5\' 4"  (1.626 m)   Wt 228 lb 12.8 oz (103.8 kg)  SpO2 99%   BMI 39.27 kg/m   General appearance: alert, no distress, WD/WN, African American female Skin: unremarkable HEENT: normocephalic, conjunctiva/corneas normal, sclerae anicteric, PERRLA, EOMi, nares patent, no discharge or erythema, pharynx normal Oral cavity: MMM, tongue normal, teeth normal Neck: supple, no lymphadenopathy, no thyromegaly, no masses, normal ROM, no bruits Chest: non tender, normal shape and expansion Heart: RRR, normal S1, S2, no murmurs Lungs: CTA bilaterally, no wheezes, rhonchi, or rales Abdomen: +bs, soft, non tender, non distended, no masses, no hepatomegaly, no splenomegaly, no bruits Back: non tender, normal ROM, no scoliosis Musculoskeletal: upper extremities non tender, no obvious deformity, normal ROM throughout, lower extremities non tender, no obvious deformity, normal ROM throughout Extremities: no edema, no cyanosis, no clubbing Pulses: 2+ symmetric, upper and lower extremities, normal cap refill Neurological: alert, oriented x 3, CN2-12 intact, strength normal upper extremities and lower extremities, sensation normal throughout, DTRs 2+ throughout, no cerebellar signs, gait normal Psychiatric: normal affect, behavior normal, pleasant  Breast/gyn/rectal - deferred to gynecology   EKG Indication physical and hypertension Rate 62 bpm, PR 200 ms, QRS 86 ms, QTC 448 ms, axis 44 degrees, normal sinus rhythm, no acute changes   Assessment  and Plan :   Encounter Diagnoses  Name Primary?  . Routine general medical examination at a health care facility Yes  . Vitamin D deficiency   . Essential hypertension   . Goiter   . Impaired fasting blood sugar   . Acanthosis nigricans   . Osteoarthritis, unspecified osteoarthritis type, unspecified site   . Overactive bladder   . Anemia, unspecified type   . Hypertriglyceridemia   . Morbid obesity ([place])   . S/P right knee arthroscopy   . Diverticulosis   . Colon ulcer     Physical exam - discussed and counseled on healthy lifestyle, diet, exercise, preventative care, vaccinations, sick and well care, proper use of emergency dept and after hours care, and addressed their concerns.    Health screening: Advised they see their eye doctor yearly for routine vision care. Advised they see their dentist yearly for routine dental care including hygiene visits twice yearly. See your gynecologist yearly for routine gynecological care.  Cancer screening Counseled on self breast exams, mammograms, cervical cancer screening  Colonoscopy:  Reviewed 01/25/18 colonoscopy showing multiple apthous ulcers in terminal ileum likely due to NSAIDs, diverticulosis in sigmoid colon, Dr. Ripley Cellar  Reviewed mammogram on file that is up to date  She is s/p hysterectomy   Vaccinations: Advised yearly influenza vaccine She is up to date  Counseled on the influenza virus vaccine.  Vaccine information sheet given.  Influenza vaccine given after consent obtained.  Shingles vaccine:  I recommend you have a shingles vaccine to help prevent shingles or herpes zoster outbreak.   Please call your insurer to inquire about coverage for the Shingrix vaccine given in 2 doses.   Some insurers cover this vaccine after age 54, some cover this after age 69.  If your insurer covers this, then call to schedule appointment to have this vaccine here.   Separate significant chronic issues  discussed: Hypertension-continue current medication  Obesity-counseled on diet and exercise recommendations with efforts to lose weight  Goiter-minimal, barely noticeable on palpation no nodules  Anemia resolved based on recent labs, check iron level today as she likely can start iron  Vitamin D deficiency-continue supplement and eat fish regularly  Overactive bladder-continue Detrol as this is working for her  Arthritis-she sees orthopedics, cautioned on frequent NSAID  use given the colon findings.  Discussed risk of NSAIDs.  Advise she follow-up with Dr. Havery Moros for recheck    Nechy was seen today for annual exam.  Diagnoses and all orders for this visit:  Routine general medical examination at a health care facility -     Comprehensive metabolic panel -     Hemoglobin A1c -     Lipid Panel -     EKG 12-Lead -     Iron  Vitamin D deficiency  Essential hypertension -     Lipid Panel  Goiter  Impaired fasting blood sugar -     Hemoglobin A1c  Acanthosis nigricans -     Hemoglobin A1c  Osteoarthritis, unspecified osteoarthritis type, unspecified site  Overactive bladder  Anemia, unspecified type -     Iron  Hypertriglyceridemia -     Lipid Panel  Morbid obesity ([facility])  S/P right knee arthroscopy  Diverticulosis  Colon ulcer    Follow-up pending labs, yearly for physical

## 2018-11-19 NOTE — Patient Instructions (Signed)
Thanks for trusting [place] with your health care and for coming in for a physical today.  Below are some general recommendations I have for you:  Yearly screenings See your eye doctor yearly for routine vision care. See your dentist yearly for routine dental care including hygiene visits twice yearly. See me here yearly for a routine physical and preventative care visit   Specific Concerns today:  . Shingles vaccine:  I recommend you have a shingles vaccine to help prevent shingles or herpes zoster outbreak.   Please call your insurer to inquire about coverage for the Shingrix vaccine given in 2 doses.   Some insurers cover this vaccine after age 29, some cover this after age 13.  If your insurer covers this, then call to schedule appointment to have this vaccine here.   Exercise: I recommend exercising most days of the week using a type of exercise that you would enjoy and stick to such as walking, running, swimming, hiking, biking, aerobics, etc. This needs to be at least 30-40 minutes at a time, at least 5 days/week with moderate intensity.  This would be 60-70% of your maximum heart rate.  For example, your maximal heart rate would be 200- your age, multiplied x 0.6 to equal 60% of your maximal heart rate.    Thus, a person age 67 would have a maximum heart rate of 160.  60% of this would be 96 beats per minute.  Thus for fat burning exercise, a 50 year old would want to exercise has sustained heart rate of about 96 bpm for fat burning.  However for heart health, they would want to exercise at a higher heart rate of about 75% of maximum heart rate which would be a heart rate of about 120 beats per minute.  So I recommend a combination of doing some exercise at moderate intensity, and some exercise at vigorous intensity for heart health.   Low Carb Diet recommendations:  I recommend you drink water throughout the day.   70 ounces or 2 liters would be a good amount.  If you have been accustomed  to drinking juice or soda, try water with lemon or water with lime, or try using no calorie flavor dropper.  I recommend the following as an example meal plan that includes 3 meals per day.   You can skip some meals periodically for intermittent fasting.  Breakfast (choose one): Marland Kitchen Omelette with egg, can include a little bit of cheese, your choice of mushrooms, peppers, onions, salsa . Smoothie with handful of spinach or kale, 1 cup of milk or water, 1 cup of berries, 1 packet of artificial sweetener such as stevia . Yogurt with fruit   Mid morning snack: . 1 fruit serving and 1 protein such as 8 almonds or 8 nuts, or vegetable such as carrots and humus or other similar vegetable   Lunch: . Salad with 3-4 ounces of lean grilled or baked meat such as fish, chicken or [place]   Mid afternoon snack: . 1 fruit serving and 1 protein such as 8 almonds or 8 nuts, or vegetable such as carrots and humus or other similar vegetable   Dinner: . Large serving of vegetables and 3-4 ounces of lean grilled or baked meat such as fish, chicken or [place] . Or vegetarian dish without meat    Avoid  . Chips, cookies, cake, donuts, soda, sweet tea, juices, candy, fast food . For now , avoid, or significantly limit grains    Please follow up  yearly for a physical.    I have included other useful information below for your review.  Preventative Care for Adults - Female      MAINTAIN REGULAR HEALTH EXAMS:  A routine yearly physical is a good way to check in with your primary care provider about your health and preventive screening. It is also an opportunity to share updates about your health and any concerns you have, and receive a thorough all-over exam.   Most health insurance companies pay for at least some preventative services.  Check with your health plan for specific coverages.  WHAT PREVENTATIVE SERVICES DO WOMEN NEED?  Adult women should have their weight and blood pressure checked  regularly.   Women age 66 and older should have their cholesterol levels checked regularly.  Women should be screened for cervical cancer with a Pap smear and pelvic exam beginning at either age 14, or 3 years after they become sexually activity.    Breast cancer screening generally begins at age 39 with a mammogram and breast exam by your primary care provider.    Beginning at age 76 and continuing to age 11, women should be screened for colorectal cancer.  Certain people may need continued testing until age 28.  Updating vaccinations is part of preventative care.  Vaccinations help protect against diseases such as the flu.  Osteoporosis is a disease in which the bones lose minerals and strength as we age. Women ages 25 and over should discuss this with their caregivers, as should women after menopause who have other risk factors.  Lab tests are generally done as part of preventative care to screen for anemia and blood disorders, to screen for problems with the kidneys and liver, to screen for bladder problems, to check blood sugar, and to check your cholesterol level.  Preventative services generally include counseling about diet, exercise, avoiding tobacco, drugs, excessive alcohol consumption, and sexually transmitted infections.    GENERAL RECOMMENDATIONS FOR GOOD HEALTH:  Healthy diet:  Eat a variety of foods, including fruit, vegetables, animal or vegetable protein, such as meat, fish, chicken, and eggs, or beans, lentils, tofu, and grains, such as rice.  Drink plenty of water daily.  Decrease saturated fat in the diet, avoid lots of red meat, processed foods, sweets, fast foods, and fried foods.  Exercise:  Aerobic exercise helps maintain good heart health. At least 30-40 minutes of moderate-intensity exercise is recommended. For example, a brisk walk that increases your heart rate and breathing. This should be done on most days of the week.   Find a type of exercise or a  variety of exercises that you enjoy so that it becomes a part of your daily life.  Examples are running, walking, swimming, water aerobics, and biking.  For motivation and support, explore group exercise such as aerobic class, spin class, Zumba, Yoga,or  martial arts, etc.    Set exercise goals for yourself, such as a certain weight goal, walk or run in a race such as a 5k walk/run.  Speak to your primary care provider about exercise goals.  Disease prevention:  If you smoke or chew tobacco, find out from your caregiver how to quit. It can literally save your life, no matter how long you have been a tobacco user. If you do not use tobacco, never begin.   Maintain a healthy diet and normal weight. Increased weight leads to problems with blood pressure and diabetes.   The Body Mass Index or BMI is a way of  measuring how much of your body is fat. Having a BMI above 27 increases the risk of heart disease, diabetes, hypertension, stroke and other problems related to obesity. Your caregiver can help determine your BMI and based on it develop an exercise and dietary program to help you achieve or maintain this important measurement at a healthful level.  High blood pressure causes heart and blood vessel problems.  Persistent high blood pressure should be treated with medicine if weight loss and exercise do not work.   Fat and cholesterol leaves deposits in your arteries that can block them. This causes heart disease and vessel disease elsewhere in your body.  If your cholesterol is found to be high, or if you have heart disease or certain other medical conditions, then you may need to have your cholesterol monitored frequently and be treated with medication.   Ask if you should have a cardiac stress test if your history suggests this. A stress test is a test done on a treadmill that looks for heart disease. This test can find disease prior to there being a problem.  Menopause can be associated with  physical symptoms and risks. Hormone replacement therapy is available to decrease these. You should talk to your caregiver about whether starting or continuing to take hormones is right for you.   Osteoporosis is a disease in which the bones lose minerals and strength as we age. This can result in serious bone fractures. Risk of osteoporosis can be identified using a bone density scan. Women ages 38 and over should discuss this with their caregivers, as should women after menopause who have other risk factors. Ask your caregiver whether you should be taking a calcium supplement and Vitamin D, to reduce the rate of osteoporosis.   Avoid drinking alcohol in excess (more than two drinks per day).  Avoid use of street drugs. Do not share needles with anyone. Ask for professional help if you need assistance or instructions on stopping the use of alcohol, cigarettes, and/or drugs.  Brush your teeth twice a day with fluoride toothpaste, and floss once a day. Good oral hygiene prevents tooth decay and gum disease. The problems can be painful, unattractive, and can cause other health problems. Visit your dentist for a routine oral and dental check up and preventive care every 6-12 months.   Look at your skin regularly.  Use a mirror to look at your back. Notify your caregivers of changes in moles, especially if there are changes in shapes, colors, a size larger than a pencil eraser, an irregular border, or development of new moles.  Safety:  Use seatbelts 100% of the time, whether driving or as a passenger.  Use safety devices such as hearing protection if you work in environments with loud noise or significant background noise.  Use safety glasses when doing any work that could send debris in to the eyes.  Use a helmet if you ride a bike or motorcycle.  Use appropriate safety gear for contact sports.  Talk to your caregiver about gun safety.  Use sunscreen with a SPF (or skin protection factor) of 15 or  greater.  Lighter skinned people are at a greater risk of skin cancer. Don't forget to also wear sunglasses in order to protect your eyes from too much damaging sunlight. Damaging sunlight can accelerate cataract formation.   Practice safe sex. Use condoms. Condoms are used for birth control and to help reduce the spread of sexually transmitted infections (or STIs).  Some of  the STIs are gonorrhea (the clap), chlamydia, syphilis, trichomonas, herpes, HPV (human papilloma virus) and HIV (human immunodeficiency virus) which causes AIDS. The herpes, HIV and HPV are viral illnesses that have no cure. These can result in disability, cancer and death.   Keep carbon monoxide and smoke detectors in your home functioning at all times. Change the batteries every 6 months or use a model that plugs into the wall.   Vaccinations:  Stay up to date with your tetanus shots and other required immunizations. You should have a booster for tetanus every 10 years. Be sure to get your flu shot every year, since 5%-20% of the [place] population comes down with the flu. The flu vaccine changes each year, so being vaccinated once is not enough. Get your shot in the fall, before the flu season peaks.   Other vaccines to consider:  Human Papilloma Virus or HPV causes cancer of the cervix, and other infections that can be transmitted from person to person. There is a vaccine for HPV, and females should get immunized between the ages of 7 and 60. It requires a series of 3 shots.   Pneumococcal vaccine to protect against certain types of pneumonia.  This is normally recommended for adults age 16 or older.  However, adults younger than 50 years old with certain underlying conditions such as diabetes, heart or lung disease should also receive the vaccine.  Shingles vaccine to protect against Varicella Zoster if you are older than age 48, or younger than 50 years old with certain underlying illness.  If you have not had the Shingrix  vaccine, please call your insurer to inquire about coverage for the Shingrix vaccine given in 2 doses.   Some insurers cover this vaccine after age 38, some cover this after age 34.  If your insurer covers this, then call to schedule appointment to have this vaccine here  Hepatitis A vaccine to protect against a form of infection of the liver by a virus acquired from food.  Hepatitis B vaccine to protect against a form of infection of the liver by a virus acquired from blood or body fluids, particularly if you work in health care.  If you plan to travel internationally, check with your local health department for specific vaccination recommendations.  Cancer Screening:  Breast cancer screening is essential to preventive care for women. All women age 52 and older should perform a breast self-exam every month. At age 25 and older, women should have their caregiver complete a breast exam each year. Women at ages 56 and older should have a mammogram (x-ray film) of the breasts. Your caregiver can discuss how often you need mammograms.    Cervical cancer screening includes taking a Pap smear (sample of cells examined under a microscope) from the cervix (end of the uterus). It also includes testing for HPV (Human Papilloma Virus, which can cause cervical cancer). Screening and a pelvic exam should begin at age 33, or 3 years after a woman becomes sexually active. Screening should occur every year, with a Pap smear but no HPV testing, up to age 24. After age 12, you should have a Pap smear every 3 years with HPV testing, if no HPV was found previously.   Most routine colon cancer screening begins at the age of 57. On a yearly basis, doctors may provide special easy to use take-home tests to check for hidden blood in the stool. Sigmoidoscopy or colonoscopy can detect the earliest forms of colon cancer and  is life saving. These tests use a small camera at the end of a tube to directly examine the colon. Speak  to your caregiver about this at age 79, when routine screening begins (and is repeated every 5 years unless early forms of pre-cancerous polyps or small growths are found).

## 2018-11-20 ENCOUNTER — Other Ambulatory Visit: Payer: Self-pay | Admitting: Medical

## 2018-11-20 LAB — COMPREHENSIVE METABOLIC PANEL
ALT: 20 IU/L (ref 0–32)
AST: 16 IU/L (ref 0–40)
Albumin/Globulin Ratio: 1.5 (ref 1.2–2.2)
Albumin: 4.4 g/dL (ref 3.8–4.8)
Alkaline Phosphatase: 50 IU/L (ref 39–117)
BUN/Creatinine Ratio: 21 (ref 9–23)
BUN: 15 mg/dL (ref 6–24)
Bilirubin Total: 0.4 mg/dL (ref 0.0–1.2)
CO2: 25 mmol/L (ref 20–29)
Calcium: 10 mg/dL (ref 8.7–10.2)
Chloride: 104 mmol/L (ref 96–106)
Creatinine, Ser: 0.71 mg/dL (ref 0.57–1.00)
GFR calc Af Amer: 115 mL/min/{1.73_m2} (ref >59)
GFR calc non Af Amer: 100 mL/min/{1.73_m2} (ref >59)
Globulin, Total: 2.9 g/dL (ref 1.5–4.5)
Glucose: 83 mg/dL (ref 65–99)
Potassium: 4.2 mmol/L (ref 3.5–5.2)
Sodium: 143 mmol/L (ref 134–144)
Total Protein: 7.3 g/dL (ref 6.0–8.5)

## 2018-11-20 LAB — LIPID PANEL
Chol/HDL Ratio: 3.1 ratio (ref 0.0–4.4)
Cholesterol, Total: 169 mg/dL (ref 100–199)
HDL: 55 mg/dL (ref >39)
LDL Chol Calc (NIH): 87 mg/dL (ref 0–99)
Triglycerides: 154 mg/dL — ABNORMAL HIGH (ref 0–149)
VLDL Cholesterol Cal: 27 mg/dL (ref 5–40)

## 2018-11-20 LAB — HEMOGLOBIN A1C
Est. average glucose Bld gHb Est-mCnc: 117 mg/dL
Hgb A1c MFr Bld: 5.7 % — ABNORMAL HIGH (ref 4.8–5.6)

## 2018-11-20 LAB — IRON: Iron: 62 ug/dL (ref 27–159)

## 2018-11-20 MED ORDER — VITAMIN D 25 MCG (1000 UNIT) PO TABS: 1000.0000 [IU] | ORAL_TABLET | Freq: Every day | ORAL | 3 refills | Status: DC

## 2018-11-20 MED ORDER — TOLTERODINE TARTRATE ER 4 MG PO CP24: 4.0000 mg | ORAL_CAPSULE | Freq: Every day | ORAL | 3 refills | Status: DC

## 2018-11-20 MED ORDER — OLMESARTAN-AMLODIPINE-HCTZ 40-5-12.5 MG PO TABS: 1.0000 | ORAL_TABLET | Freq: Every day | ORAL | 3 refills | Status: DC

## 2018-11-20 MED ORDER — POTASSIUM CHLORIDE ER 10 MEQ PO TBCR: 10.0000 meq | EXTENDED_RELEASE_TABLET | Freq: Every day | ORAL | 3 refills | Status: DC

## 2018-12-14 ENCOUNTER — Other Ambulatory Visit: Payer: Self-pay | Admitting: Medical

## 2018-12-14 ENCOUNTER — Other Ambulatory Visit (INDEPENDENT_AMBULATORY_CARE_PROVIDER_SITE_OTHER): Payer: Self-pay | Admitting: Orthopaedic Surgery

## 2018-12-22 ENCOUNTER — Other Ambulatory Visit: Payer: Self-pay | Admitting: Medical

## 2019-01-09 ENCOUNTER — Telehealth: Payer: Self-pay | Admitting: Medical

## 2019-01-09 NOTE — Telephone Encounter (Signed)
Received fax in response to Continuity of care records request sent to [facility]. Fax states pt had no pap in 2020.

## 2019-01-29 ENCOUNTER — Other Ambulatory Visit (INDEPENDENT_AMBULATORY_CARE_PROVIDER_SITE_OTHER): Payer: Self-pay | Admitting: Orthopaedic Surgery

## 2019-02-06 ENCOUNTER — Encounter: Payer: Self-pay | Admitting: Physician Assistant

## 2019-02-06 ENCOUNTER — Ambulatory Visit: Payer: 59 | Admitting: Physician Assistant

## 2019-02-06 ENCOUNTER — Other Ambulatory Visit: Payer: Self-pay

## 2019-02-06 DIAGNOSIS — M170 Bilateral primary osteoarthritis of knee: Secondary | ICD-10-CM | POA: Diagnosis not present

## 2019-02-06 DIAGNOSIS — M1712 Unilateral primary osteoarthritis, left knee: Secondary | ICD-10-CM

## 2019-02-06 MED ORDER — LIDOCAINE HCL 1 % IJ SOLN
0.5000 mL | INTRAMUSCULAR | Status: AC | PRN
  Administered 2019-02-06: .5 mL

## 2019-02-06 MED ORDER — DICLOFENAC SODIUM 75 MG PO TBEC: 75.0000 mg | DELAYED_RELEASE_TABLET | Freq: Two times a day (BID) | ORAL | 0 refills | Status: DC

## 2019-02-06 MED ORDER — METHYLPREDNISOLONE ACETATE 40 MG/ML IJ SUSP
40.0000 mg | INTRAMUSCULAR | Status: AC | PRN
  Administered 2019-02-06: 13:00:00 40 mg via INTRA_ARTICULAR

## 2019-02-06 NOTE — Progress Notes (Signed)
   Procedure Note  Patient: Kayla Larsen             Date of Birth: [birth year]           MRN: [id number]             Visit Date: 02/06/2019 HPI: [name] returns today bilateral knee pain.  She is asking for injections both knees.  She has known osteoarthritis of both knees.  States her previous injections have helped.  She does feel that her recent weight gain has affected her knees.  Posterior pains behind her knees.  She also like a refill on her diclofenac today.  Review of systems: Negative for fevers chills shortness of breath chest pain.  Physical exam: Bilateral knees good range of motion of both knees.  No abnormal warmth or erythema.  She is able do full leg raise both knees.  Procedures: Visit Diagnoses:  1. Primary osteoarthritis of left knee   2. Primary osteoarthritis of both knees     Large Joint Inj: bilateral knee on 02/06/2019 1:14 PM Indications: pain Details: 22 G 1.5 in needle, anterolateral approach  Arthrogram: No  Medications (Right): 0.5 mL lidocaine 1 %; 40 mg methylPREDNISolone acetate 40 MG/ML Medications (Left): 0.5 mL lidocaine 1 %; 40 mg methylPREDNISolone acetate 40 MG/ML Outcome: tolerated well, no immediate complications Procedure, treatment alternatives, risks and benefits explained, specific risks discussed. Consent was given by the patient. Immediately prior to procedure a time out was called to verify the correct patient, procedure, equipment, support staff and site/side marked as required. Patient was prepped and draped in the usual sterile fashion.    Plan: She will follow up with [place] on as-needed basis.  She understands she needs to wait least 3 months between injections knees.  Refill on her diclofenac sent in today.

## 2019-03-07 ENCOUNTER — Other Ambulatory Visit: Payer: Self-pay | Admitting: Medical

## 2019-03-07 NOTE — Telephone Encounter (Signed)
Is this okay to refill? 

## 2019-03-14 ENCOUNTER — Ambulatory Visit: Payer: 59 | Attending: Internal Medicine

## 2019-03-14 DIAGNOSIS — Z23 Encounter for immunization: Secondary | ICD-10-CM | POA: Insufficient documentation

## 2019-03-14 NOTE — Progress Notes (Signed)
   Covid-19 Vaccination Clinic  Name:  AMAURI HELBLING    MRN: [id number] DOB: 04-Aug-1970  03/14/2019  Ms. Vandevander was observed post Covid-19 immunization for 15 minutes without incident. She was provided with Vaccine Information Sheet and instruction to access the V-Safe system.   Ms. Gonce was instructed to call 911 with any severe reactions post vaccine: Marland Kitchen Difficulty breathing  . Swelling of face and throat  . A fast heartbeat  . A bad rash all over body  . Dizziness and weakness

## 2019-03-21 ENCOUNTER — Other Ambulatory Visit: Payer: Self-pay

## 2019-03-21 MED ORDER — DICLOFENAC SODIUM 75 MG PO TBEC: 75.0000 mg | DELAYED_RELEASE_TABLET | Freq: Two times a day (BID) | ORAL | 0 refills | Status: DC

## 2019-03-26 ENCOUNTER — Encounter: Payer: Self-pay | Admitting: Medical

## 2019-03-26 ENCOUNTER — Ambulatory Visit: Payer: 59 | Admitting: Medical

## 2019-03-26 ENCOUNTER — Other Ambulatory Visit: Payer: Self-pay

## 2019-03-26 VITALS — BP 130/76 | HR 79 | Temp 98.4°F | Ht 64.0 in | Wt 235.2 lb

## 2019-03-26 DIAGNOSIS — M79674 Pain in right toe(s): Secondary | ICD-10-CM | POA: Diagnosis not present

## 2019-03-26 DIAGNOSIS — G8929 Other chronic pain: Secondary | ICD-10-CM

## 2019-03-26 DIAGNOSIS — Z79899 Other long term (current) drug therapy: Secondary | ICD-10-CM

## 2019-03-26 DIAGNOSIS — T50Z95A Adverse effect of other vaccines and biological substances, initial encounter: Secondary | ICD-10-CM

## 2019-03-26 DIAGNOSIS — M25561 Pain in right knee: Secondary | ICD-10-CM | POA: Diagnosis not present

## 2019-03-26 DIAGNOSIS — M25562 Pain in left knee: Secondary | ICD-10-CM

## 2019-03-26 NOTE — Progress Notes (Signed)
Subjective: Chief Complaint  Patient presents with  . Allergic Reaction    covid vaccine-joint pain    Here for possible covid vaccine reaction. 03/17/19 got first covid vaccine in afternoon.  At the time of the vaccine, had some nausea, but no arm pain, redness, no swelling of face or tongue, no hives, no SOB, no syncope.  The same night of the vaccine, started getting pains in right great toe, top of left foot, around lateral elbow and left knee.   The pains have persisted since then.  Knees feel swollen inside, but no other joint swelling.   Has some skin sensitivity under left tricep, but no rash.    Past Medical History:  Diagnosis Date  . Acanthosis nigricans   . Anemia   . Arthritis of knee   . External hemorrhoid   . History of blood transfusion 2009   2 units after hysterectomy  . Hypertension   . Obesity   . Plantar fasciitis of left foot   . Sleep apnea 2010   borderline no cpap used  . Wears glasses    Current Outpatient Medications on File Prior to Visit  Medication Sig Dispense Refill  . cholecalciferol (VITAMIN D3) 25 MCG (1000 UT) tablet Take 1 tablet (1,000 Units total) by mouth daily. 90 tablet 3  . Cranberry 125 MG TABS Take 2 tablets by mouth daily.    . diclofenac (VOLTAREN) 75 MG EC tablet Take 1 tablet (75 mg total) by mouth 2 (two) times daily. 60 tablet 0  . Multiple Vitamin (MULTIVITAMIN WITH MINERALS) TABS Take 1 tablet by mouth daily.    . Olmesartan-amLODIPine-HCTZ 40-5-12.5 MG TABS Take 1 tablet by mouth daily. 90 tablet 3  . potassium chloride (KLOR-CON) 10 MEQ tablet Take 1 tablet (10 mEq total) by mouth daily. 90 tablet 3  . tolterodine (DETROL LA) 4 MG 24 hr capsule Take 1 capsule (4 mg total) by mouth daily. 90 capsule 3  . TURMERIC PO Take by mouth daily.    . methocarbamol (ROBAXIN) 500 MG tablet TAKE 1 TABLET BY MOUTH AT BEDTIME (Patient not taking: Reported on 03/26/2019) 15 tablet 0   No current facility-administered medications on file prior to  visit.   ROS as in subjective   Objective: BP 130/76   Pulse 79   Temp 98.4 F (36.9 C)   Ht 5\' 4"  (1.626 m)   Wt 235 lb 3.2 oz (106.7 kg)   SpO2 99%   BMI 40.37 kg/m   Gen: wd, wn, nad No facial swelling or angioedema No obvious And rash she is tender over left great toe DIP, tender over left anterior foot dorsally, tender right lateral epicondyle of the elbow, tender in left popliteal area with no obvious joint swelling or joint laxity of both knees, she does have knee sleeves on both knees  no other obvious musculoskeletal tori tenderness or deformity   Assessment: Encounter Diagnoses  Name Primary?  . Toe pain, right Yes  . Adverse effect of vaccine, initial encounter   . Chronic pain of both knees   . High risk medication use      Plan: We discussed her symptoms.  May or may not be related to the vaccine of her first Covid vaccine.  Uric acid level today to further evaluate joint pain.  She already sees orthopedist and sees them tomorrow for follow-up on chronic knee pain.  She has no sign of rash or anaphylaxis.  She already uses topical anti-inflammatory for pain.  Work note given due to joint pains.  I registered her complaint for the vaccine adverse event reporting system  [employer] report number:  [id number]  Dustee was seen today for allergic reaction.  Diagnoses and all orders for this visit:  Toe pain, right -     Uric acid  Adverse effect of vaccine, initial encounter  Chronic pain of both knees -     Uric acid  High risk medication use -     Uric acid

## 2019-03-27 ENCOUNTER — Ambulatory Visit: Payer: 59 | Admitting: Physician Assistant

## 2019-03-27 ENCOUNTER — Encounter: Payer: Self-pay | Admitting: Physician Assistant

## 2019-03-27 DIAGNOSIS — M25572 Pain in left ankle and joints of left foot: Secondary | ICD-10-CM

## 2019-03-27 DIAGNOSIS — M2550 Pain in unspecified joint: Secondary | ICD-10-CM

## 2019-03-27 LAB — URIC ACID: Uric Acid: 4.5 mg/dL (ref 2.6–6.2)

## 2019-03-27 NOTE — Progress Notes (Signed)
Office Visit Note   Patient: Kayla Larsen           Date of Birth: 06/03/1970           MRN: [id number] Visit Date: 03/27/2019              Requested by: Carlena Hurl, PA-C [address] [place],  [place] [address] PCP: Carlena Hurl, PA-C   Assessment & Plan: Visit Diagnoses:  1. Arthralgia of multiple joints     Plan: Recommend she continue to use her diclofenac orally once a day.  Then add topical Voltaren gel up to 4 times daily to Q-tips the areas that are problematic.  Would not recommend any steroid injections at this time.  Also it is too soon to inject both knees.  Would wait 2 weeks after her second Covid vaccine injection before any steroids.  She can follow-up with [place] as needed.  Questions encouraged and answered.  Follow-Up Instructions: Return if symptoms worsen or fail to improve.   Orders:  No orders of the defined types were placed in this encounter.  No orders of the defined types were placed in this encounter.     Procedures: No procedures performed   Clinical Data: No additional findings.   Subjective: Chief Complaint  Patient presents with  . Right Knee - Pain  . Left Knee - Pain  . Right Foot - Pain  . Left Foot - Pain    HPI Kayla Larsen comes in today complaining of multiple joints bothering her after first Covid vaccine injection on 03/17/2019.  She states her knees were doing well till then the right knee pain is increased.  She last had bilateral cortisone injections in her knees on 02/06/2019.  She also notes that she had a rash after the injection proximal medial left humerus after the injection.  She has had multiple joint aches including her hands right great toe and right elbow.  She has had no new injury to her knees, bilateral feet, right elbow or hands.  She did see her primary care provider yesterday who checked a uric acid level to rule out gout this was within normal limits.  Review of Systems  Constitutional: Negative  for chills and fever.  Musculoskeletal: Positive for arthralgias.  Skin: Positive for rash.     Objective: Vital Signs: There were no vitals taken for this visit.  Physical Exam Constitutional:      Appearance: She is not ill-appearing or diaphoretic.  Pulmonary:     Effort: Pulmonary effort is normal.  Neurological:     Mental Status: She is alert and oriented to person, place, and time.  Psychiatric:        Mood and Affect: Mood normal.     Ortho Exam Upper extremities: Right elbow tenderness over the lateral epicondyle region.  Provocative testing causes pain.  No erythema rashes skin lesions of right elbow.  Good range of motion of both elbows without pain.  Bilateral hands subjective soreness in the hands but no rashes skin lesions ulcerations or edema. Bilateral feet no impending ulcers, rashes erythema or abnormal warmth.  Midfoot dorsal bossing bilaterally.  Right foot tenderness at the first proximal phalanx no gross deformity of the right foot.  No tenderness at the MP joint right great toe. Specialty Comments:  No specialty comments available.  Imaging: No results found.   PMFS History: Patient Active Problem List   Diagnosis Date Noted  . Diverticulosis 11/19/2018  . Colon  ulcer 11/19/2018  . Anemia 11/24/2016  . Vitamin D deficiency 11/24/2016  . S/P right knee arthroscopy 12/13/2015  . Acanthosis nigricans 05/03/2015  . Morbid obesity ([place]) 05/03/2015  . Overactive bladder 05/03/2015  . Essential hypertension 05/03/2015  . Routine general medical examination at a health care facility 05/03/2015  . Arthritis, senescent 05/03/2015  . Hypertriglyceridemia 05/03/2015  . Impaired fasting blood sugar 05/03/2015  . Goiter 05/03/2015   Past Medical History:  Diagnosis Date  . Acanthosis nigricans   . Anemia   . Arthritis of knee   . External hemorrhoid   . History of blood transfusion 2009   2 units after hysterectomy  . Hypertension   . Obesity   .  Plantar fasciitis of left foot   . Sleep apnea 2010   borderline no cpap used  . Wears glasses     Family History  Problem Relation Age of Onset  . Cancer Mother        liver and lung  . Hypertension Mother   . Diabetes Father   . Kidney failure Father   . Hypertension Brother   . Hypertension Brother   . Hypertension Brother   . Cancer Maternal Aunt        thyroid  . Diabetes Maternal Aunt   . Hypertension Maternal Aunt   . Heart disease Maternal Grandmother   . Diabetes Maternal Uncle   . Hypertension Maternal Uncle   . Stroke Neg Hx     Past Surgical History:  Procedure Laterality Date  . ABDOMINAL HYSTERECTOMY  2009  . CESAREAN SECTION     x 2  . COLONOSCOPY  2009   done for anemia eval; Dr. Benson Norway  . COLONOSCOPY  01/25/2018   multiple aphthous ulcers in terminal ileum likely d/t NSAIDs, diverticulosis in sigmoid colon, Dr. Vienna Cellar  . KNEE SURGERY  10/2015   knot removed from R knee  . LAPAROTOMY Left 07/30/2012   Procedure: LEFT SALPINGO OOPHERECTOMY,  OMENTECTOMY, AND LYSIS OF ADHESIONS;  Surgeon: Alvino Chapel, MD;  Location: [facility];  Service: Gynecology;  Laterality: Left;  . OOPHORECTOMY  2014   left   . removed a cyst on her ovaries  2014   Social History   Occupational History  . Not on file  Tobacco Use  . Smoking status: Former Smoker    Packs/day: 0.25    Years: 4.00    Pack years: 1.00    Types: Cigarettes    Quit date: 09/29/2011    Years since quitting: 7.4  . Smokeless tobacco: Never Used  Substance and Sexual Activity  . Alcohol use: No    Alcohol/week: 2.0 standard drinks    Types: 2 Shots of liquor per week  . Drug use: No  . Sexual activity: Not Currently

## 2019-04-08 NOTE — Telephone Encounter (Signed)
Ok to do for 6 months

## 2019-04-09 ENCOUNTER — Ambulatory Visit: Payer: 59 | Attending: Internal Medicine

## 2019-04-09 DIAGNOSIS — Z23 Encounter for immunization: Secondary | ICD-10-CM

## 2019-04-09 NOTE — Progress Notes (Signed)
   Covid-19 Vaccination Clinic  Name:  Kayla Larsen    MRN: [id number] DOB: 10-31-1970  04/09/2019  [name] was observed post Covid-19 immunization for 15 minutes without incident. She was provided with Vaccine Information Sheet and instruction to access the V-Safe system.   [name] was instructed to call 911 with any severe reactions post vaccine: Marland Kitchen Difficulty breathing  . Swelling of face and throat  . A fast heartbeat  . A bad rash all over body  . Dizziness and weakness   Immunizations Administered    Name Date Dose VIS Date Route   Pfizer COVID-19 Vaccine 04/09/2019  1:47 PM 0.3 mL 12/20/2018 Intramuscular   Manufacturer: [place]   Lot: [id number]   [place]: [id number]

## 2019-04-20 ENCOUNTER — Other Ambulatory Visit: Payer: Self-pay | Admitting: Medical

## 2019-05-12 ENCOUNTER — Telehealth: Payer: Self-pay

## 2019-05-12 NOTE — Telephone Encounter (Signed)
Pt. Called stating that last Wednesday she was around her daughter and her daughter tested positive for covid last Wednesday so she told her work and they told her to get tested so she did last week and it was negative. Then they told her to wait 5 days and get tested again for covid so she was just tested again today. But she said that she had to pick her daughter up from the hospital because she got so sick from covid so she was around her again yesterday they both had their masks on and sand she is fully vaccinated. She wants to know if she needs to get tested again or wait 5 days to get tested today her work told her to call her doctor's office for advise.

## 2019-05-13 NOTE — Telephone Encounter (Signed)
I discussed this with my other colleagues here.   We are in agreement along with CDC guidelines that if you are fully vaccinated and are wearing masks, particualry if you are careful to wear a mask in your house around someone who is positive,  then you are safe to go back to work.  If you are not wearing a mask and being in direct contact as a caregiver then you should refrain from going to work until the sick person's quarantine is over, typically 10 days.   See if she has any other questions.  If she needs a note stating this, let me know and I can write a letter

## 2019-05-13 NOTE — Telephone Encounter (Signed)
Pt. Is aware of recommendations and had no further questions at this time.

## 2019-05-14 ENCOUNTER — Other Ambulatory Visit: Payer: Self-pay

## 2019-05-14 ENCOUNTER — Ambulatory Visit: Payer: 59 | Admitting: Physician Assistant

## 2019-05-14 ENCOUNTER — Encounter: Payer: Self-pay | Admitting: Physician Assistant

## 2019-05-14 DIAGNOSIS — M170 Bilateral primary osteoarthritis of knee: Secondary | ICD-10-CM | POA: Diagnosis not present

## 2019-05-14 DIAGNOSIS — M1712 Unilateral primary osteoarthritis, left knee: Secondary | ICD-10-CM | POA: Diagnosis not present

## 2019-05-14 MED ORDER — LIDOCAINE HCL 1 % IJ SOLN
0.5000 mL | INTRAMUSCULAR | Status: AC | PRN
  Administered 2019-05-14: .5 mL

## 2019-05-14 MED ORDER — LIDOCAINE HCL 1 % IJ SOLN
0.5000 mL | INTRAMUSCULAR | Status: AC | PRN
  Administered 2019-05-14: 15:00:00 .5 mL

## 2019-05-14 MED ORDER — METHYLPREDNISOLONE ACETATE 40 MG/ML IJ SUSP
40.0000 mg | INTRAMUSCULAR | Status: AC | PRN
  Administered 2019-05-14: 15:00:00 40 mg via INTRA_ARTICULAR

## 2019-05-14 MED ORDER — METHYLPREDNISOLONE ACETATE 40 MG/ML IJ SUSP
40.0000 mg | INTRAMUSCULAR | Status: AC | PRN
  Administered 2019-05-14: 40 mg via INTRA_ARTICULAR

## 2019-05-14 NOTE — Progress Notes (Signed)
   Procedure Note  Patient: Kayla Larsen             Date of Birth: 1970-10-12           MRN: [id number]             Visit Date: 05/14/2019 HPI: Kayla Larsen returns today requesting injections both knees.  Last injections were given 01/29/2019 well.  Last Covid injection was 04/09/2019.  She states she is having pain medial aspect of both knees.  No new injury.  Stiffness whenever she first gets up after sitting too long.  States that the multiple joint aches that she was having was felt by her primary care physician be due to the Covid injection and are resolving.  Physical exam: Bilateral knees no abnormal warmth erythema overall good range of motion.  Ambulates fine assistive device  Procedures: Visit Diagnoses:  1. Primary osteoarthritis of left knee   2. Primary osteoarthritis of both knees     Large Joint Inj: bilateral knee on 05/14/2019 3:03 PM Indications: pain Details: 25 G 1.5 in needle, anterolateral approach  Arthrogram: No  Medications (Right): 0.5 mL lidocaine 1 %; 40 mg methylPREDNISolone acetate 40 MG/ML Medications (Left): 0.5 mL lidocaine 1 %; 40 mg methylPREDNISolone acetate 40 MG/ML Outcome: tolerated well, no immediate complications Procedure, treatment alternatives, risks and benefits explained, specific risks discussed. Consent was given by the patient. Immediately prior to procedure a time out was called to verify the correct patient, procedure, equipment, support staff and site/side marked as required. Patient was prepped and draped in the usual sterile fashion.     Plan: We will see her back on as-needed basis.  Recommend 3 to 4 months before repeat injections.  Questions were encouraged and answered

## 2019-06-24 ENCOUNTER — Ambulatory Visit: Payer: 59 | Admitting: Medical

## 2019-06-24 ENCOUNTER — Encounter: Payer: Self-pay | Admitting: Medical

## 2019-06-24 ENCOUNTER — Other Ambulatory Visit: Payer: Self-pay

## 2019-06-24 VITALS — BP 138/84 | HR 70 | Ht 64.0 in | Wt 231.2 lb

## 2019-06-24 DIAGNOSIS — E669 Obesity, unspecified: Secondary | ICD-10-CM

## 2019-06-24 DIAGNOSIS — L089 Local infection of the skin and subcutaneous tissue, unspecified: Secondary | ICD-10-CM | POA: Diagnosis not present

## 2019-06-24 DIAGNOSIS — R7301 Impaired fasting glucose: Secondary | ICD-10-CM

## 2019-06-24 LAB — POCT GLYCOSYLATED HEMOGLOBIN (HGB A1C): Hemoglobin A1C: 5.8 % — AB (ref 4.0–5.6)

## 2019-06-24 LAB — POCT CBG (FASTING - GLUCOSE)-MANUAL ENTRY: Glucose Fasting, POC: 74 mg/dL (ref 70–99)

## 2019-06-24 NOTE — Progress Notes (Signed)
Subjective:  Kayla Larsen is a 49 y.o. female who presents for Chief Complaint  Patient presents with  . Recurrent Skin Infections    abdomen      Here for boil under abdomen and skin.  She gets recurrent boils under her abdominal skin folds that gets moist.  Recently she had a irritated red boil that came to a head, bursted on its on.  It has been draining.  She is trying to lose weight just on a diet 2 week ago.  Has already lost 4 pounds.  No fever, no nausea or vomiting.  No other aggravating or relieving factors.    No other c/o.  Past Medical History:  Diagnosis Date  . Acanthosis nigricans   . Anemia   . Arthritis of knee   . External hemorrhoid   . History of blood transfusion 2009   2 units after hysterectomy  . Hypertension   . Obesity   . Plantar fasciitis of left foot   . Sleep apnea 2010   borderline no cpap used  . Wears glasses    Current Outpatient Medications on File Prior to Visit  Medication Sig Dispense Refill  . cholecalciferol (VITAMIN D3) 25 MCG (1000 UT) tablet Take 1 tablet (1,000 Units total) by mouth daily. 90 tablet 3  . Cranberry 125 MG TABS Take 2 tablets by mouth daily.    . diclofenac (VOLTAREN) 75 MG EC tablet Take 1 tablet (75 mg total) by mouth 2 (two) times daily. 60 tablet 0  . doxycycline (VIBRAMYCIN) 100 MG capsule Take 100 mg by mouth daily.    . Multiple Vitamin (MULTIVITAMIN WITH MINERALS) TABS Take 1 tablet by mouth daily.    . Olmesartan-amLODIPine-HCTZ 40-5-12.5 MG TABS Take 1 tablet by mouth daily. 90 tablet 3  . potassium chloride (KLOR-CON) 10 MEQ tablet Take 1 tablet (10 mEq total) by mouth daily. 90 tablet 3  . tolterodine (DETROL LA) 4 MG 24 hr capsule Take 1 capsule (4 mg total) by mouth daily. 90 capsule 3  . TURMERIC PO Take by mouth daily.    . methocarbamol (ROBAXIN) 500 MG tablet TAKE 1 TABLET BY MOUTH AT BEDTIME (Patient not taking: Reported on 06/24/2019) 15 tablet 0   No current facility-administered medications on  file prior to visit.     The following portions of the patient's history were reviewed and updated as appropriate: allergies, current medications, past family history, past medical history, past social history, past surgical history and problem list.  ROS Otherwise as in subjective above    Objective: BP 138/84   Pulse 70   Ht 5\' 4"  (1.626 m)   Wt 231 lb 3.2 oz (104.9 kg)   SpO2 91%   BMI 39.69 kg/m   General appearance: alert, no distress, well developed, well nourished Skin under abdominal pannus with centrally located open draining abscess without induration or fluctuance, opening is approximately 1 cm diameter but no deep wound, mild discharge noted watery     Assessment: Encounter Diagnoses  Name Primary?  . Recurrent infection of skin Yes  . Impaired fasting blood sugar   . Obesity with serious comorbidity, unspecified classification, unspecified obesity type      Plan: She will use the doxycycline she already has at home.  We discussed the need to lose weight as a means of prevention.  I checked her blood sugar and hemoglobin A1c today which are normal.  We discussed wound care, changing out dressings twice daily.  Counseled on  strategies for weight loss through healthy diet and exercise and not fad diets for short-term diets.  Offered referral to weight management clinic or bariatric clinic but she wants to try this on her own.  Ariannie was seen today for recurrent skin infections.  Diagnoses and all orders for this visit:  Recurrent infection of skin  Impaired fasting blood sugar -     HgB A1c -     Glucose (CBG), Fasting  Obesity with serious comorbidity, unspecified classification, unspecified obesity type   Follow up: soon for physical as planned

## 2019-07-03 ENCOUNTER — Other Ambulatory Visit: Payer: Self-pay | Admitting: Physician Assistant

## 2019-08-25 ENCOUNTER — Ambulatory Visit: Payer: 59 | Admitting: Physician Assistant

## 2019-08-27 ENCOUNTER — Encounter: Payer: Self-pay | Admitting: Physician Assistant

## 2019-08-27 ENCOUNTER — Ambulatory Visit (INDEPENDENT_AMBULATORY_CARE_PROVIDER_SITE_OTHER): Payer: 59 | Admitting: Physician Assistant

## 2019-08-27 DIAGNOSIS — M170 Bilateral primary osteoarthritis of knee: Secondary | ICD-10-CM

## 2019-08-27 DIAGNOSIS — M1712 Unilateral primary osteoarthritis, left knee: Secondary | ICD-10-CM

## 2019-08-27 NOTE — Progress Notes (Signed)
   Procedure Note  Patient: Kayla Larsen             Date of Birth: 10/17/1970           MRN: [id number]             Visit Date: 08/27/2019  HPI: Kayla Larsen returns today requesting injection both knees.  She last was seen 05/14/2019 and states that the knee injections helped.  She said no new injury.  No fevers chills.Taking diclofenac and Tylenol for the knee pain.  She is states that both knees swell at times.  She is wearing knee braces when she is at work.  Physical exam: Bilateral knees no abnormal warmth erythema or effusion.  Ambulates without any assistive device.  Overall good range of motion both knees. Procedures: Visit Diagnoses:  1. Primary osteoarthritis of left knee   2. Primary osteoarthritis of both knees     Large Joint Inj: bilateral knee on 08/27/2019 4:43 PM Indications: pain Details: 22 G 1.5 in needle, anterolateral approach  Arthrogram: No  Medications (Right): 0.5 mL lidocaine 1 %; 40 mg methylPREDNISolone acetate 40 MG/ML Medications (Left): 0.5 mL lidocaine 1 %; 40 mg methylPREDNISolone acetate 40 MG/ML Outcome: tolerated well, no immediate complications Procedure, treatment alternatives, risks and benefits explained, specific risks discussed. Consent was given by the patient. Immediately prior to procedure a time out was called to verify the correct patient, procedure, equipment, support staff and site/side marked as required. Patient was prepped and draped in the usual sterile fashion.    Plan: She will follow up with [place] on an as-needed basis.  She understands to wait at least 3 months between injections.  Questions were encouraged and answered

## 2019-08-28 MED ORDER — METHYLPREDNISOLONE ACETATE 40 MG/ML IJ SUSP
40.0000 mg | INTRAMUSCULAR | Status: AC | PRN
  Administered 2019-08-27: 40 mg via INTRA_ARTICULAR

## 2019-08-28 MED ORDER — LIDOCAINE HCL 1 % IJ SOLN
0.5000 mL | INTRAMUSCULAR | Status: AC | PRN
  Administered 2019-08-27: .5 mL

## 2019-10-12 ENCOUNTER — Other Ambulatory Visit: Payer: Self-pay | Admitting: Medical

## 2019-10-14 MED ORDER — DICLOFENAC SODIUM 75 MG PO TBEC: 75.0000 mg | DELAYED_RELEASE_TABLET | Freq: Two times a day (BID) | ORAL | 0 refills | Status: DC

## 2019-10-14 NOTE — Addendum Note (Signed)
Addended by: Carlena Hurl on: 10/14/2019 03:11 PM   Modules accepted: Orders

## 2019-11-20 ENCOUNTER — Other Ambulatory Visit: Payer: Self-pay

## 2019-11-20 ENCOUNTER — Encounter: Payer: Self-pay | Admitting: Medical

## 2019-11-20 ENCOUNTER — Ambulatory Visit (INDEPENDENT_AMBULATORY_CARE_PROVIDER_SITE_OTHER): Payer: 59 | Admitting: Medical

## 2019-11-20 VITALS — BP 116/74 | HR 67 | Ht 64.0 in | Wt 227.6 lb

## 2019-11-20 DIAGNOSIS — E559 Vitamin D deficiency, unspecified: Secondary | ICD-10-CM | POA: Diagnosis not present

## 2019-11-20 DIAGNOSIS — I10 Essential (primary) hypertension: Secondary | ICD-10-CM | POA: Diagnosis not present

## 2019-11-20 DIAGNOSIS — N3281 Overactive bladder: Secondary | ICD-10-CM

## 2019-11-20 DIAGNOSIS — K5790 Diverticulosis of intestine, part unspecified, without perforation or abscess without bleeding: Secondary | ICD-10-CM

## 2019-11-20 DIAGNOSIS — M170 Bilateral primary osteoarthritis of knee: Secondary | ICD-10-CM

## 2019-11-20 DIAGNOSIS — Z7185 Encounter for immunization safety counseling: Secondary | ICD-10-CM | POA: Insufficient documentation

## 2019-11-20 DIAGNOSIS — E049 Nontoxic goiter, unspecified: Secondary | ICD-10-CM

## 2019-11-20 DIAGNOSIS — M109 Gout, unspecified: Secondary | ICD-10-CM

## 2019-11-20 DIAGNOSIS — Z1159 Encounter for screening for other viral diseases: Secondary | ICD-10-CM | POA: Insufficient documentation

## 2019-11-20 DIAGNOSIS — Z9889 Other specified postprocedural states: Secondary | ICD-10-CM

## 2019-11-20 DIAGNOSIS — R7301 Impaired fasting glucose: Secondary | ICD-10-CM

## 2019-11-20 DIAGNOSIS — Z0000 Encounter for general adult medical examination without abnormal findings: Secondary | ICD-10-CM

## 2019-11-20 DIAGNOSIS — E781 Pure hyperglyceridemia: Secondary | ICD-10-CM

## 2019-11-20 DIAGNOSIS — Z1231 Encounter for screening mammogram for malignant neoplasm of breast: Secondary | ICD-10-CM

## 2019-11-20 DIAGNOSIS — L83 Acanthosis nigricans: Secondary | ICD-10-CM

## 2019-11-20 DIAGNOSIS — E669 Obesity, unspecified: Secondary | ICD-10-CM

## 2019-11-20 MED ORDER — OLMESARTAN-AMLODIPINE-HCTZ 40-5-12.5 MG PO TABS: 1.0000 | ORAL_TABLET | Freq: Every day | ORAL | 3 refills | Status: DC

## 2019-11-20 MED ORDER — TOLTERODINE TARTRATE ER 4 MG PO CP24: 4.0000 mg | ORAL_CAPSULE | Freq: Every day | ORAL | 3 refills | Status: DC

## 2019-11-20 MED ORDER — VITAMIN D 25 MCG (1000 UNIT) PO TABS: 1000.0000 [IU] | ORAL_TABLET | Freq: Every day | ORAL | 3 refills | Status: DC

## 2019-11-20 NOTE — Progress Notes (Addendum)
Subjective:   HPI  Kayla Larsen is a 49 y.o. female who presents for Chief Complaint  Patient presents with  . Annual Exam    with fasting labs     Patient Care Team: Addalee Kavanagh, Leward Quan as PCP - General (Family Medicine) Sees dentist Sees eye doctor GI, Dr. El[facility], gynecology Dr. Gershon Mussel, podiatry Orthopedics   Concerns: Compliant with BP medication.     Obesity - quit eating out, and lately was doing better except for ice cream  Reviewed their medical, surgical, family, social, medication, and allergy history and updated chart as appropriate.  Past Medical History:  Diagnosis Date  . Acanthosis nigricans   . Anemia   . Arthritis of knee   . External hemorrhoid   . History of blood transfusion 2009   2 units after hysterectomy  . Hypertension   . Obesity   . Plantar fasciitis of left foot   . Sleep apnea 2010   borderline no cpap used  . Wears glasses     Past Surgical History:  Procedure Laterality Date  . ABDOMINAL HYSTERECTOMY  2009  . CESAREAN SECTION     x 2  . COLONOSCOPY  2009   done for anemia eval; Dr. Benson Norway  . COLONOSCOPY  01/25/2018   multiple aphthous ulcers in terminal ileum likely d/t NSAIDs, diverticulosis in sigmoid colon, Dr. Adams Cellar  . KNEE SURGERY  10/2015   knot removed from R knee  . LAPAROTOMY Left 07/30/2012   Procedure: LEFT SALPINGO OOPHERECTOMY,  OMENTECTOMY, AND LYSIS OF ADHESIONS;  Surgeon: Alvino Chapel, MD;  Location: [facility];  Service: Gynecology;  Laterality: Left;  . OOPHORECTOMY  2014   left   . removed a cyst on her ovaries  2014     Family History  Problem Relation Age of Onset  . Cancer Mother        liver and lung  . Hypertension Mother   . Diabetes Father   . Kidney failure Father   . Hypertension Brother   . Hypertension Brother   . Hypertension Brother   . Cancer Maternal Aunt        thyroid  . Diabetes Maternal Aunt   . Hypertension Maternal Aunt    . Heart disease Maternal Grandmother   . Diabetes Maternal Uncle   . Hypertension Maternal Uncle   . Stroke Neg Hx      Current Outpatient Medications:  .  allopurinol (ZYLOPRIM) 100 MG tablet, Take 100 mg by mouth daily., Disp: , Rfl:  .  cholecalciferol (VITAMIN D3) 25 MCG (1000 UNIT) tablet, Take 1 tablet (1,000 Units total) by mouth daily., Disp: 90 tablet, Rfl: 3 .  Cranberry 125 MG TABS, Take 2 tablets by mouth daily., Disp: , Rfl:  .  doxycycline (VIBRAMYCIN) 100 MG capsule, Take 100 mg by mouth daily. , Disp: , Rfl:  .  Multiple Vitamin (MULTIVITAMIN WITH MINERALS) TABS, Take 1 tablet by mouth daily., Disp: , Rfl:  .  Olmesartan-amLODIPine-HCTZ 40-5-12.5 MG TABS, Take 1 tablet by mouth daily., Disp: 90 tablet, Rfl: 3 .  potassium chloride (KLOR-CON) 10 MEQ tablet, Take 1 tablet (10 mEq total) by mouth daily., Disp: 90 tablet, Rfl: 3 .  terbinafine (LAMISIL) 250 MG tablet, Take 250 mg by mouth daily., Disp: , Rfl:  .  tolterodine (DETROL LA) 4 MG 24 hr capsule, Take 1 capsule (4 mg total) by mouth daily., Disp: 90 capsule, Rfl: 3 .  methocarbamol (ROBAXIN) 500  MG tablet, TAKE 1 TABLET BY MOUTH AT BEDTIME (Patient not taking: Reported on 11/20/2019), Disp: 15 tablet, Rfl: 0 .  TURMERIC PO, Take by mouth daily. (Patient not taking: Reported on 11/20/2019), Disp: , Rfl:   Allergies  Allergen Reactions  . Amlodipine     Ankle swelling at 10mg   . Lisinopril     Cough      Review of Systems Constitutional: -fever, -chills, -sweats, -unexpected weight change, -decreased appetite, -fatigue Allergy: -sneezing, -itching, -congestion Dermatology: -changing moles, --rash, -lumps ENT: -runny nose, -ear pain, -sore throat, -hoarseness, -sinus pain, -teeth pain, - ringing in ears, -hearing loss, -nosebleeds Cardiology: -chest pain, -palpitations, -swelling, -difficulty breathing when lying flat, -waking up short of breath Respiratory: -cough, -shortness of breath, -difficulty breathing  with exercise or exertion, -wheezing, -coughing up blood Gastroenterology: -abdominal pain, -nausea, -vomiting, -diarrhea, -constipation, -blood in stool, -changes in bowel movement, -difficulty swallowing or eating Hematology: -bleeding, -bruising  Musculoskeletal: -joint aches, -muscle aches, -joint swelling, -back pain, -neck pain, -cramping, -changes in gait Ophthalmology: denies vision changes, eye redness, itching, discharge Urology: -burning with urination, -difficulty urinating, -blood in urine, -urinary frequency, -urgency, -incontinence Neurology: -headache, -weakness, -tingling, -numbness, -memory loss, -falls, -dizziness Psychology: -depressed mood, -agitation, -sleep problems Breast/gyn: -breast tendnerss, -discharge, -lumps, -vaginal discharge,- irregular periods, -heavy periods     Objective:  BP 116/74   Pulse 67   Ht 5\' 4"  (1.626 m)   Wt 227 lb 9.6 oz (103.2 kg)   SpO2 96%   BMI 39.07 kg/m   General appearance: alert, no distress, WD/WN, African American female Skin: unremarkable HEENT: normocephalic, conjunctiva/corneas normal, sclerae anicteric, PERRLA, EOMi, nares patent, no discharge or erythema, pharynx normal Oral cavity: MMM, tongue normal, teeth normal Neck: supple, no lymphadenopathy, no thyromegaly, no masses, normal ROM, no bruits Chest: non tender, normal shape and expansion Heart: RRR, normal S1, S2, no murmurs Lungs: CTA bilaterally, no wheezes, rhonchi, or rales Abdomen: +bs, soft, non tender, non distended, no masses, no hepatomegaly, no splenomegaly, no bruits Back: non tender, normal ROM, no scoliosis Musculoskeletal: upper extremities non tender, no obvious deformity, normal ROM throughout, lower extremities non tender, no obvious deformity, normal ROM throughout Extremities: no edema, no cyanosis, no clubbing Pulses: 2+ symmetric, upper and lower extremities, normal cap refill Neurological: alert, oriented x 3, CN2-12 intact, strength normal upper  extremities and lower extremities, sensation normal throughout, DTRs 2+ throughout, no cerebellar signs, gait normal Psychiatric: normal affect, behavior normal, pleasant  Breast/gyn/rectal - deferred to gynecology    Assessment and Plan :   Encounter Diagnoses  Name Primary?  . Routine general medical examination at a health care facility Yes  . Vitamin D deficiency   . Essential hypertension   . Diverticulosis   . Goiter   . Impaired fasting blood sugar   . Acanthosis nigricans   . Primary osteoarthritis of both knees   . Overactive bladder   . Hypertriglyceridemia   . Class 1 obesity with serious comorbidity in adult, unspecified BMI, unspecified obesity type   . S/P right knee arthroscopy   . Vaccine counseling   . Encounter for screening mammogram for malignant neoplasm of breast   . Gout of foot, unspecified cause, unspecified chronicity, unspecified laterality   . Encounter for hepatitis C screening test for low risk patient     Physical exam - discussed and counseled on healthy lifestyle, diet, exercise, preventative care, vaccinations, sick and well care, proper use of emergency dept and after hours care, and addressed their  concerns.    I reviewed recent blood work she had done through biometric screening at work from October 2021: Total cholesterol 187, LDL 105, triglycerides 138, cholesterol ratio 3.2, HDL 58, glucose 97, BMI 39.6, blood pressure 116/74  BUN 15, creatinine 0.76, normal hepatic function panel   Health screening: Advised they see their eye doctor yearly for routine vision care. Advised they see their dentist yearly for routine dental care including hygiene visits twice yearly. See your gynecologist yearly for routine gynecological care.  Cancer screening Counseled on self breast exams, mammograms, cervical cancer screening  Colonoscopy:  Reviewed 01/25/18 colonoscopy showing multiple apthous ulcers in terminal ileum likely due to NSAIDs,  diverticulosis in sigmoid colon, Dr. Buchanan Cellar  Reviewed mammogram on file that is up to date  She is s/p hysterectomy   Vaccinations: Advised yearly influenza vaccine She is up to date  Shingles vaccine:  I recommend you have a shingles vaccine to help prevent shingles or herpes zoster outbreak.   Please call your insurer to inquire about coverage for the Shingrix vaccine given in 2 doses.   Some insurers cover this vaccine after age 65, some cover this after age 19.  If your insurer covers this, then call to schedule appointment to have this vaccine here.   Separate significant chronic issues discussed: Hypertension-continue current medication  Obesity-counseled on diet and exercise recommendations with efforts to lose weight  Goiter-minimal, barely noticeable on palpation no nodules  Vitamin D deficiency-continue supplement and eat fish regularly  Overactive bladder-continue Detrol as this is working for her  Arthritis-sees ortho and podiatry   Anzleigh was seen today for annual exam.  Diagnoses and all orders for this visit:  Routine general medical examination at a health care facility -     Hemoglobin A1c -     Cancel: Comprehensive metabolic panel -     CBC -     TSH -     T4, Free -     MM DIGITAL SCREENING BILATERAL; Future -     Hepatitis C antibody  Vitamin D deficiency  Essential hypertension -     Cancel: Comprehensive metabolic panel  Diverticulosis  Goiter -     TSH -     T4, Free  Impaired fasting blood sugar -     Hemoglobin A1c  Acanthosis nigricans  Primary osteoarthritis of both knees  Overactive bladder  Hypertriglyceridemia  Class 1 obesity with serious comorbidity in adult, unspecified BMI, unspecified obesity type  S/P right knee arthroscopy  Vaccine counseling  Encounter for screening mammogram for malignant neoplasm of breast -     MM DIGITAL SCREENING BILATERAL; Future  Gout of foot, unspecified cause,  unspecified chronicity, unspecified laterality  Encounter for hepatitis C screening test for low risk patient -     Hepatitis C antibody  Other orders -     tolterodine (DETROL LA) 4 MG 24 hr capsule; Take 1 capsule (4 mg total) by mouth daily. -     Olmesartan-amLODIPine-HCTZ 40-5-12.5 MG TABS; Take 1 tablet by mouth daily. -     cholecalciferol (VITAMIN D3) 25 MCG (1000 UNIT) tablet; Take 1 tablet (1,000 Units total) by mouth daily.    Follow-up pending labs, yearly for physical

## 2019-11-20 NOTE — Addendum Note (Signed)
Addended by: Carlena Hurl on: 11/20/2019 10:59 AM   Modules accepted: Orders

## 2019-11-21 ENCOUNTER — Other Ambulatory Visit: Payer: Self-pay | Admitting: Medical

## 2019-11-21 LAB — HEMOGLOBIN A1C
Est. average glucose Bld gHb Est-mCnc: 126 mg/dL
Hgb A1c MFr Bld: 6 % — ABNORMAL HIGH (ref 4.8–5.6)

## 2019-11-21 LAB — CBC
Hematocrit: 33.8 % — ABNORMAL LOW (ref 34.0–46.6)
Hemoglobin: 10.7 g/dL — ABNORMAL LOW (ref 11.1–15.9)
MCH: 23.4 pg — ABNORMAL LOW (ref 26.6–33.0)
MCHC: 31.7 g/dL (ref 31.5–35.7)
MCV: 74 fL — ABNORMAL LOW (ref 79–97)
Platelets: 376 10*3/uL (ref 150–450)
RBC: 4.57 x10E6/uL (ref 3.77–5.28)
RDW: 15.6 % — ABNORMAL HIGH (ref 11.7–15.4)
WBC: 5.7 10*3/uL (ref 3.4–10.8)

## 2019-11-21 LAB — T4, FREE: Free T4: 1.45 ng/dL (ref 0.82–1.77)

## 2019-11-21 LAB — TSH: TSH: 1.17 u[IU]/mL (ref 0.450–4.500)

## 2019-11-21 LAB — HEPATITIS C ANTIBODY: Hep C Virus Ab: <0.1 s/co ratio (ref 0.0–0.9)

## 2019-11-21 MED ORDER — METFORMIN HCL 500 MG PO TABS: 500.0000 mg | ORAL_TABLET | Freq: Every day | ORAL | 2 refills | Status: DC

## 2019-11-22 LAB — SPECIMEN STATUS REPORT

## 2019-11-22 LAB — IRON: Iron: 97 ug/dL (ref 27–159)

## 2019-11-23 ENCOUNTER — Other Ambulatory Visit: Payer: Self-pay | Admitting: Medical

## 2019-11-28 ENCOUNTER — Telehealth: Payer: Self-pay

## 2019-11-28 ENCOUNTER — Other Ambulatory Visit: Payer: Self-pay | Admitting: Medical

## 2019-11-28 MED ORDER — GLIPIZIDE 5 MG PO TABS: 5.0000 mg | ORAL_TABLET | Freq: Every day | ORAL | 1 refills | Status: DC

## 2019-11-28 NOTE — Telephone Encounter (Signed)
Called pharmacy discontinued Metformin called in new meter, strips & lancets.  Called pt and informed

## 2019-11-28 NOTE — Telephone Encounter (Signed)
Recv'd fax from [facility] stating pt is having severe stomach upset with Metformin 500mg  and discontinued its use and can she try the metformin ER instead?

## 2019-11-28 NOTE — Telephone Encounter (Signed)
Stop Metformin.  Lets try something different that usually doesn't cause that symptoms.  Begin glipizide 1 tablet daily in the morning.   The only potential significant side effect of this would be sugars running to low causing shakes, sweats, dizziness, for example.  I don't anticipate this happening.   I would like her to check sugars fasting a few mornings per week or anytime feeling shaky or uneasy to make sure sugars not too low.    Please call out glucometer ,strips, lancets, #100 strips and lancets, 3 refills to pharmacy.

## 2019-12-01 ENCOUNTER — Telehealth: Payer: Self-pay | Admitting: Physician Assistant

## 2019-12-01 ENCOUNTER — Ambulatory Visit (INDEPENDENT_AMBULATORY_CARE_PROVIDER_SITE_OTHER): Payer: 59 | Admitting: Physician Assistant

## 2019-12-01 DIAGNOSIS — M1712 Unilateral primary osteoarthritis, left knee: Secondary | ICD-10-CM

## 2019-12-01 DIAGNOSIS — M1711 Unilateral primary osteoarthritis, right knee: Secondary | ICD-10-CM

## 2019-12-01 MED ORDER — LIDOCAINE HCL 1 % IJ SOLN
0.5000 mL | INTRAMUSCULAR | Status: AC | PRN
  Administered 2019-12-01: .5 mL

## 2019-12-01 MED ORDER — METHYLPREDNISOLONE ACETATE 40 MG/ML IJ SUSP
40.0000 mg | INTRAMUSCULAR | Status: AC | PRN
  Administered 2019-12-01: 40 mg via INTRA_ARTICULAR

## 2019-12-01 NOTE — Telephone Encounter (Signed)
Pt would like to know if we received her FMLA paperwork?  [phone]

## 2019-12-01 NOTE — Progress Notes (Signed)
   Procedure Note  Patient: Kayla Larsen             Date of Birth: May 21, 1970           MRN: [id number]             Visit Date: 12/01/2019 HPI: Kayla Larsen is well-known to our service comes in today requesting cortisone injection of both knees.  Last injections were 08/27/2019 and did well until just recently.  She states she has been told she is prediabetic.  Last hemoglobin A1c was 6.0.  No recent fevers chills or vaccines.  Physical exam: Bilateral knees good range of motion without pain no abnormal warmth erythema or effusion of either knee.  Procedures: Visit Diagnoses:  1. Primary osteoarthritis of left knee   2. Primary osteoarthritis of right knee     Large Joint Inj: bilateral knee on 12/01/2019 2:46 PM Indications: pain Details: 22 G 1.5 in needle, anterolateral approach  Arthrogram: No  Medications (Right): 0.5 mL lidocaine 1 %; 40 mg methylPREDNISolone acetate 40 MG/ML Medications (Left): 0.5 mL lidocaine 1 %; 40 mg methylPREDNISolone acetate 40 MG/ML Outcome: tolerated well, no immediate complications Procedure, treatment alternatives, risks and benefits explained, specific risks discussed. Consent was given by the patient. Immediately prior to procedure a time out was called to verify the correct patient, procedure, equipment, support staff and site/side marked as required. Patient was prepped and draped in the usual sterile fashion.     Plan: Vascular to watch her glucose levels mostly over the next few days as she has a brother with diabetes at home.  If her glucose levels remain elevated she will call her primary care physician.  Questions encouraged and answered at length.  She understands to wait 3 months between injections.

## 2019-12-04 ENCOUNTER — Other Ambulatory Visit: Payer: Self-pay | Admitting: Medical

## 2019-12-07 ENCOUNTER — Other Ambulatory Visit: Payer: Self-pay | Admitting: Medical

## 2019-12-11 ENCOUNTER — Telehealth: Payer: Self-pay | Admitting: Orthopaedic Surgery

## 2019-12-11 NOTE — Telephone Encounter (Signed)
Received vm from pt checking on forms. IC,lmvm advised to contact [employer].

## 2019-12-24 ENCOUNTER — Other Ambulatory Visit: Payer: Self-pay | Admitting: Medical

## 2019-12-28 ENCOUNTER — Other Ambulatory Visit: Payer: Self-pay | Admitting: Medical

## 2019-12-30 ENCOUNTER — Other Ambulatory Visit: Payer: Self-pay | Admitting: Medical

## 2020-01-02 ENCOUNTER — Other Ambulatory Visit: Payer: Self-pay | Admitting: Medical

## 2020-01-22 ENCOUNTER — Other Ambulatory Visit: Payer: Self-pay | Admitting: Medical

## 2020-01-23 ENCOUNTER — Other Ambulatory Visit: Payer: Self-pay | Admitting: Physician Assistant

## 2020-01-23 ENCOUNTER — Other Ambulatory Visit: Payer: Self-pay

## 2020-01-23 MED ORDER — DICLOFENAC SODIUM 75 MG PO TBEC: 75.0000 mg | DELAYED_RELEASE_TABLET | Freq: Two times a day (BID) | ORAL | 3 refills | Status: DC

## 2020-02-05 ENCOUNTER — Other Ambulatory Visit: Payer: Self-pay | Admitting: Medical

## 2020-02-13 LAB — HM MAMMOGRAPHY

## 2020-02-24 ENCOUNTER — Other Ambulatory Visit: Payer: Self-pay | Admitting: Medical

## 2020-03-01 ENCOUNTER — Other Ambulatory Visit: Payer: Self-pay | Admitting: Medical

## 2020-03-10 ENCOUNTER — Encounter: Payer: Self-pay | Admitting: Physician Assistant

## 2020-03-10 ENCOUNTER — Ambulatory Visit: Payer: 59 | Admitting: Physician Assistant

## 2020-03-10 VITALS — Ht 64.0 in | Wt 221.0 lb

## 2020-03-10 DIAGNOSIS — M1711 Unilateral primary osteoarthritis, right knee: Secondary | ICD-10-CM | POA: Diagnosis not present

## 2020-03-10 DIAGNOSIS — M1712 Unilateral primary osteoarthritis, left knee: Secondary | ICD-10-CM | POA: Diagnosis not present

## 2020-03-10 MED ORDER — LIDOCAINE HCL 1 % IJ SOLN
0.5000 mL | INTRAMUSCULAR | Status: AC | PRN
  Administered 2020-03-10: .5 mL

## 2020-03-10 MED ORDER — METHYLPREDNISOLONE ACETATE 40 MG/ML IJ SUSP
40.0000 mg | INTRAMUSCULAR | Status: AC | PRN
  Administered 2020-03-10: 40 mg via INTRA_ARTICULAR

## 2020-03-10 NOTE — Progress Notes (Signed)
   Procedure Note  Patient: Kayla Larsen             Date of Birth: 1970/06/13           MRN: [id number]             Visit Date: 03/10/2020  HPI: Kayla Larsen comes in today requesting bilateral knee injections she has known osteoarthritis of both knees.  States her right knee is more painful than left.  She denies any fevers chills.  Physical exam: Bilateral knees good range of motion.  Significant patellofemoral crepitus both knees with range of motion.  No abnormal warmth erythema or effusion of either knee.   Procedures: Visit Diagnoses:  1. Primary osteoarthritis of left knee   2. Primary osteoarthritis of right knee     Large Joint Inj: bilateral knee on 03/10/2020 1:35 PM Indications: pain Details: 22 G 1.5 in needle, anterolateral approach  Arthrogram: No  Medications (Right): 0.5 mL lidocaine 1 %; 40 mg methylPREDNISolone acetate 40 MG/ML Medications (Left): 0.5 mL lidocaine 1 %; 40 mg methylPREDNISolone acetate 40 MG/ML Outcome: tolerated well, no immediate complications Procedure, treatment alternatives, risks and benefits explained, specific risks discussed. Consent was given by the patient. Immediately prior to procedure a time out was called to verify the correct patient, procedure, equipment, support staff and site/side marked as required. Patient was prepped and draped in the usual sterile fashion.     Plan: She will follow up with [place] as needed.  She knows to wait at least 3 months between injections.  Questions were encouraged and answered

## 2020-03-22 ENCOUNTER — Other Ambulatory Visit: Payer: Self-pay | Admitting: Medical

## 2020-04-01 ENCOUNTER — Other Ambulatory Visit: Payer: Self-pay | Admitting: Medical

## 2020-04-02 ENCOUNTER — Telehealth: Payer: Self-pay

## 2020-04-02 ENCOUNTER — Other Ambulatory Visit: Payer: Self-pay | Admitting: Medical

## 2020-04-02 MED ORDER — POTASSIUM CHLORIDE ER 10 MEQ PO TBCR: EXTENDED_RELEASE_TABLET | ORAL | 1 refills | Status: DC

## 2020-04-02 NOTE — Telephone Encounter (Signed)
Pt left message needs refill on her Potassium

## 2020-04-02 NOTE — Telephone Encounter (Signed)
done

## 2020-04-16 ENCOUNTER — Encounter: Payer: Self-pay | Admitting: Medical

## 2020-04-30 ENCOUNTER — Other Ambulatory Visit: Payer: Self-pay | Admitting: Physician Assistant

## 2020-06-14 ENCOUNTER — Other Ambulatory Visit: Payer: Self-pay

## 2020-06-14 ENCOUNTER — Ambulatory Visit (INDEPENDENT_AMBULATORY_CARE_PROVIDER_SITE_OTHER): Payer: 59 | Admitting: Physician Assistant

## 2020-06-14 ENCOUNTER — Encounter: Payer: Self-pay | Admitting: Physician Assistant

## 2020-06-14 DIAGNOSIS — M170 Bilateral primary osteoarthritis of knee: Secondary | ICD-10-CM

## 2020-06-14 DIAGNOSIS — M1711 Unilateral primary osteoarthritis, right knee: Secondary | ICD-10-CM

## 2020-06-14 MED ORDER — METHYLPREDNISOLONE ACETATE 40 MG/ML IJ SUSP
40.0000 mg | INTRAMUSCULAR | Status: AC | PRN
  Administered 2020-06-14: 40 mg via INTRA_ARTICULAR

## 2020-06-14 MED ORDER — LIDOCAINE HCL 1 % IJ SOLN
0.5000 mL | INTRAMUSCULAR | Status: AC | PRN
  Administered 2020-06-14: .5 mL

## 2020-06-14 NOTE — Progress Notes (Signed)
   Procedure Note  Patient: Kayla Larsen             Date of Birth: 1970/11/07           MRN: [id number]             Visit Date: 06/14/2020 HPI: [name] comes in today requesting cortisone injection both knees.  She had an injection of both knees 3-22's.  States they did well until recently.  She is having more pain in the right knee and she is considering having the right knee replaced the future.  She has known severe arthritis both knees.  She had no new injuries.  Procedures: Visit Diagnoses:  1. Primary osteoarthritis of right knee   2. Primary osteoarthritis of both knees     Large Joint Inj: bilateral knee on 06/14/2020 11:53 AM Indications: pain Details: 22 G 1.5 in needle, anterolateral approach  Arthrogram: No  Medications (Right): 0.5 mL lidocaine 1 %; 40 mg methylPREDNISolone acetate 40 MG/ML Medications (Left): 0.5 mL lidocaine 1 %; 40 mg methylPREDNISolone acetate 40 MG/ML Outcome: tolerated well, no immediate complications Procedure, treatment alternatives, risks and benefits explained, specific risks discussed. Consent was given by the patient. Immediately prior to procedure a time out was called to verify the correct patient, procedure, equipment, support staff and site/side marked as required. Patient was prepped and draped in the usual sterile fashion.      Plan: She will follow-up with [place] as needed.  She states she like to think about having total knee replacement on the right sometime in January 2023.

## 2020-07-23 ENCOUNTER — Other Ambulatory Visit: Payer: Self-pay

## 2020-07-23 ENCOUNTER — Other Ambulatory Visit: Payer: Self-pay | Admitting: Medical

## 2020-07-23 MED ORDER — METHOCARBAMOL 500 MG PO TABS: 500.0000 mg | ORAL_TABLET | Freq: Every day | ORAL | 0 refills | Status: DC

## 2020-07-26 ENCOUNTER — Ambulatory Visit: Payer: 59 | Admitting: Physician Assistant

## 2020-08-29 ENCOUNTER — Other Ambulatory Visit: Payer: Self-pay | Admitting: Medical

## 2020-09-21 ENCOUNTER — Encounter: Payer: Self-pay | Admitting: Internal Medicine

## 2020-09-21 DIAGNOSIS — Z90710 Acquired absence of both cervix and uterus: Secondary | ICD-10-CM | POA: Insufficient documentation

## 2020-09-22 ENCOUNTER — Ambulatory Visit (INDEPENDENT_AMBULATORY_CARE_PROVIDER_SITE_OTHER): Payer: 59 | Admitting: Orthopaedic Surgery

## 2020-09-22 ENCOUNTER — Other Ambulatory Visit: Payer: Self-pay

## 2020-09-22 ENCOUNTER — Encounter: Payer: Self-pay | Admitting: Orthopaedic Surgery

## 2020-09-22 DIAGNOSIS — M25561 Pain in right knee: Secondary | ICD-10-CM | POA: Diagnosis not present

## 2020-09-22 DIAGNOSIS — M25562 Pain in left knee: Secondary | ICD-10-CM

## 2020-09-22 DIAGNOSIS — M1712 Unilateral primary osteoarthritis, left knee: Secondary | ICD-10-CM

## 2020-09-22 DIAGNOSIS — M1711 Unilateral primary osteoarthritis, right knee: Secondary | ICD-10-CM | POA: Diagnosis not present

## 2020-09-22 DIAGNOSIS — G8929 Other chronic pain: Secondary | ICD-10-CM

## 2020-09-22 NOTE — Progress Notes (Signed)
Office Visit Note   Patient: Kayla Larsen           Date of Birth: March 13, 1970           MRN: [id number] Visit Date: 09/22/2020              Requested by: Carlena Hurl, PA-C [address] [place],   [id number] PCP: Carlena Hurl, PA-C   Assessment & Plan: Visit Diagnoses:  1. Primary osteoarthritis of right knee   2. Primary osteoarthritis of left knee   3. Chronic pain of left knee   4. Chronic pain of right knee     Plan: Per the patient's request I did provide a steroid injection in both her knees that she tolerated well.  She said at some point she will come in to discuss knee replacement surgery.  Follow-up thus far is as needed.  At her next visit when she does come in I would like a weight and BMI calculation.  Follow-Up Instructions: Return if symptoms worsen or fail to improve.   Orders:  No orders of the defined types were placed in this encounter.  No orders of the defined types were placed in this encounter.     Procedures: No procedures performed   Clinical Data: No additional findings.   Subjective: Chief Complaint  Patient presents with   Right Knee - Pain, Follow-up   Left Knee - Pain, Follow-up  The patient is a 50 year old well-known to [place].  She has arthritis of both her knees with the right worse than left.  She has been coming in about every 3 months for steroid injections in her knees.  She said they last about 2 months.  She says the left shoulder really last longer than that but is the right knee that bothers her the most.  We have performed arthroscopic surgery on that knee before on the right side.  She has had no acute change in her medical status.  She does wish to have steroid injections in both knees today.  HPI  Review of Systems There is currently listed no headache, chest pain, shortness of breath, fever, chills, nausea, vomiting  Objective: Vital Signs: There were no vitals taken for this visit.  Physical  Exam She is alert and orient x3 and in no acute distress Ortho Exam Examination of both knees shows slight varus malalignment with a mild effusion of both knees.  Both knees have medial joint line tenderness and patellofemoral tenderness with good range of motion.  Both knees are ligamentously stable. Specialty Comments:  No specialty comments available.  Imaging: No results found.   PMFS History: Patient Active Problem List   Diagnosis Date Noted   H/O: hysterectomy 09/21/2020   Vaccine counseling 11/20/2019   Encounter for screening mammogram for malignant neoplasm of breast 11/20/2019   Gout of foot 11/20/2019   Encounter for hepatitis C screening test for low risk patient 11/20/2019   Recurrent infection of skin 06/24/2019   Primary osteoarthritis of both knees 05/14/2019   Diverticulosis 11/19/2018   Vitamin D deficiency 11/24/2016   Tonsil stone 01/28/2016   S/P right knee arthroscopy 12/13/2015   Obesity with serious comorbidity 05/03/2015   Overactive bladder 05/03/2015   Essential hypertension 05/03/2015   Routine general medical examination at a health care facility 05/03/2015   Arthritis, senescent 05/03/2015   Hypertriglyceridemia 05/03/2015   Impaired fasting blood sugar 05/03/2015   Past Medical History:  Diagnosis Date   Acanthosis nigricans  Anemia    Arthritis of knee    External hemorrhoid    History of blood transfusion 2009   2 units after hysterectomy   Hypertension    Obesity    Plantar fasciitis of left foot    Sleep apnea 2010   borderline no cpap used   Wears glasses     Family History  Problem Relation Age of Onset   Cancer Mother        liver and lung   Hypertension Mother    Diabetes Father    Kidney failure Father    Hypertension Brother    Hypertension Brother    Hypertension Brother    Cancer Maternal Aunt        thyroid   Diabetes Maternal Aunt    Hypertension Maternal Aunt    Heart disease Maternal Grandmother     Diabetes Maternal Uncle    Hypertension Maternal Uncle    Stroke Neg Hx     Past Surgical History:  Procedure Laterality Date   ABDOMINAL HYSTERECTOMY  2009   CESAREAN SECTION     x 2   COLONOSCOPY  2009   done for anemia eval; Dr. Benson Norway   COLONOSCOPY  01/25/2018   multiple aphthous ulcers in terminal ileum likely d/t NSAIDs, diverticulosis in sigmoid colon, Dr. [place] Cellar   KNEE SURGERY  10/2015   knot removed from R knee   LAPAROTOMY Left 07/30/2012   Procedure: LEFT SALPINGO OOPHERECTOMY,  OMENTECTOMY, AND LYSIS OF ADHESIONS;  Surgeon: Alvino Chapel, MD;  Location: [facility];  Service: Gynecology;  Laterality: Left;   OOPHORECTOMY  2014   left    removed a cyst on her ovaries  2014   Social History   Occupational History   Not on file  Tobacco Use   Smoking status: Former    Packs/day: 0.25    Years: 4.00    Pack years: 1.00    Types: Cigarettes    Quit date: 09/29/2011    Years since quitting: 8.9   Smokeless tobacco: Never  Vaping Use   Vaping Use: Never used  Substance and Sexual Activity   Alcohol use: No    Alcohol/week: 2.0 standard drinks    Types: 2 Shots of liquor per week   Drug use: No   Sexual activity: Not Currently

## 2020-11-12 ENCOUNTER — Telehealth: Payer: Self-pay | Admitting: Orthopaedic Surgery

## 2020-11-12 NOTE — Telephone Encounter (Signed)
Pt submitted medical release form, and $25.00 cash payment to [employer]. Accepted 11/12/20

## 2020-11-12 NOTE — Telephone Encounter (Signed)
We have received forms.Patient is requesting intermittent leave for work. If agreeable, please advise amount and duration. Thanks  She is scheduled for rov 11/8.

## 2020-11-16 ENCOUNTER — Ambulatory Visit (INDEPENDENT_AMBULATORY_CARE_PROVIDER_SITE_OTHER): Payer: 59 | Admitting: Orthopaedic Surgery

## 2020-11-16 ENCOUNTER — Ambulatory Visit (INDEPENDENT_AMBULATORY_CARE_PROVIDER_SITE_OTHER): Payer: 59

## 2020-11-16 ENCOUNTER — Encounter: Payer: Self-pay | Admitting: Orthopaedic Surgery

## 2020-11-16 VITALS — Ht 64.0 in | Wt 225.0 lb

## 2020-11-16 DIAGNOSIS — M25562 Pain in left knee: Secondary | ICD-10-CM | POA: Diagnosis not present

## 2020-11-16 DIAGNOSIS — M1711 Unilateral primary osteoarthritis, right knee: Secondary | ICD-10-CM | POA: Diagnosis not present

## 2020-11-16 DIAGNOSIS — G8929 Other chronic pain: Secondary | ICD-10-CM

## 2020-11-16 DIAGNOSIS — M25561 Pain in right knee: Secondary | ICD-10-CM

## 2020-11-16 DIAGNOSIS — M1712 Unilateral primary osteoarthritis, left knee: Secondary | ICD-10-CM

## 2020-11-16 NOTE — Progress Notes (Signed)
Office Visit Note   Patient: Kayla Larsen           Date of Birth: [birth year]           MRN: [id number] Visit Date: 11/16/2020              Requested by: Carlena Hurl, PA-C [address] [place],  [place] [address] PCP: Carlena Hurl, PA-C   Assessment & Plan: Visit Diagnoses:  1. Chronic pain of right knee   2. Chronic pain of left knee   3. Primary osteoarthritis of right knee   4. Primary osteoarthritis of left knee     Plan: I agree with her request for knee replacement surgery at this point for her right knee.  Her deformity is quite significant and the arthritis is severe.  I showed her knee replacement model and described in detail what the surgery involves.  We talked about intraoperative and postoperative course and the risk and benefits of surgery.  We will work on getting the surgery scheduled hopefully in the near future and will be in touch.  All questions and concerns were answered and addressed.  Follow-Up Instructions: Return for 2 weeks post-op.   Orders:  Orders Placed This Encounter  Procedures   XR Knee 1-2 Views Right   No orders of the defined types were placed in this encounter.     Procedures: No procedures performed   Clinical Data: No additional findings.   Subjective: Chief Complaint  Patient presents with   Right Knee - Follow-up  The patient is well-known to me.  She is a 50 year old female with debilitating arthritis involving both her knees.  We have not x-rayed her knee since 2018.  She has significant varus malalignment more so on the right knee and that knee has now become debilitating in terms of the pain that is causing her and the detrimental effect is having on her quality of life and mobility.  She does work and mainly sitdown job.  Her BMI is down to 38.62.  She is a diabetic but has always had a hemoglobin A1c in the upper 5-6 range.  She said her blood glucose stays down.  She has a physical next month.  At this  point her knee pain is daily and it is 10 out of 10.  It is detriment affecting her mobility, her quality of life and actives daily living.  She has failed conservative treatment now for multiple years.  I have seen her for over 5 years for her knees.  HPI  Review of Systems There is currently listed no headache, chest pain, shortness of breath, fever, chills, nausea, vomiting  Objective: Vital Signs: Ht 5\' 4"  (1.626 m)   Wt 225 lb (102.1 kg)   BMI 38.62 kg/m   Physical Exam She is alert and orient x3 and in no acute distress Ortho Exam Examination of her right knee shows significant varus malalignment.  There is also a slight flexion contracture.  There is severe patellofemoral crepitation and medial and lateral joint line tenderness.  There is pretty good range of motion of the knee.  There is some laxity ligamentously in the knee as well. Specialty Comments:  No specialty comments available.  Imaging: XR Knee 1-2 Views Right  Result Date: 11/16/2020 2 views of the right knee show severe end-stage arthritis.  There is significant varus malalignment.  There are large osteophytes in all 3 compartments.  There is complete loss of the joint space  of the medial compartment and the patellofemoral joint.    PMFS History: Patient Active Problem List   Diagnosis Date Noted   Primary osteoarthritis of right knee 11/16/2020   H/O: hysterectomy 09/21/2020   Vaccine counseling 11/20/2019   Encounter for screening mammogram for malignant neoplasm of breast 11/20/2019   Gout of foot 11/20/2019   Encounter for hepatitis C screening test for low risk patient 11/20/2019   Recurrent infection of skin 06/24/2019   Primary osteoarthritis of both knees 05/14/2019   Diverticulosis 11/19/2018   Vitamin D deficiency 11/24/2016   Tonsil stone 01/28/2016   S/P right knee arthroscopy 12/13/2015   Obesity with serious comorbidity 05/03/2015   Overactive bladder 05/03/2015   Essential hypertension  05/03/2015   Routine general medical examination at a health care facility 05/03/2015   Arthritis, senescent 05/03/2015   Hypertriglyceridemia 05/03/2015   Impaired fasting blood sugar 05/03/2015   Past Medical History:  Diagnosis Date   Acanthosis nigricans    Anemia    Arthritis of knee    External hemorrhoid    History of blood transfusion 2009   2 units after hysterectomy   Hypertension    Obesity    Plantar fasciitis of left foot    Sleep apnea 2010   borderline no cpap used   Wears glasses     Family History  Problem Relation Age of Onset   Cancer Mother        liver and lung   Hypertension Mother    Diabetes Father    Kidney failure Father    Hypertension Brother    Hypertension Brother    Hypertension Brother    Cancer Maternal Aunt        thyroid   Diabetes Maternal Aunt    Hypertension Maternal Aunt    Heart disease Maternal Grandmother    Diabetes Maternal Uncle    Hypertension Maternal Uncle    Stroke Neg Hx     Past Surgical History:  Procedure Laterality Date   ABDOMINAL HYSTERECTOMY  2009   CESAREAN SECTION     x 2   COLONOSCOPY  2009   done for anemia eval; Dr. Benson Norway   COLONOSCOPY  01/25/2018   multiple aphthous ulcers in terminal ileum likely d/t NSAIDs, diverticulosis in sigmoid colon, Dr. Aitkin Cellar   KNEE SURGERY  10/2015   knot removed from R knee   LAPAROTOMY Left 07/30/2012   Procedure: LEFT SALPINGO OOPHERECTOMY,  OMENTECTOMY, AND LYSIS OF ADHESIONS;  Surgeon: Alvino Chapel, MD;  Location: [facility];  Service: Gynecology;  Laterality: Left;   OOPHORECTOMY  2014   left    removed a cyst on her ovaries  2014   Social History   Occupational History   Not on file  Tobacco Use   Smoking status: Former    Packs/day: 0.25    Years: 4.00    Pack years: 1.00    Types: Cigarettes    Quit date: 09/29/2011    Years since quitting: 9.1   Smokeless tobacco: Never  Vaping Use   Vaping Use: Never used  Substance and Sexual  Activity   Alcohol use: No    Alcohol/week: 2.0 standard drinks    Types: 2 Shots of liquor per week   Drug use: No   Sexual activity: Not Currently

## 2020-12-03 ENCOUNTER — Other Ambulatory Visit: Payer: Self-pay | Admitting: Physician Assistant

## 2020-12-05 ENCOUNTER — Other Ambulatory Visit: Payer: Self-pay | Admitting: Medical

## 2020-12-08 ENCOUNTER — Telehealth: Payer: Self-pay | Admitting: Orthopaedic Surgery

## 2020-12-08 NOTE — Telephone Encounter (Signed)
[employer] forms received. To [employer].

## 2020-12-11 IMAGING — MG DIGITAL SCREENING BILATERAL MAMMOGRAM WITH TOMO AND CAD
8 series · 8 of 24 positions shown · non-contrast
Comparison: Previous exam(s).

CLINICAL DATA: Screening.

EXAM:
DIGITAL SCREENING BILATERAL MAMMOGRAM WITH TOMO AND CAD

[L CC synth-2D]
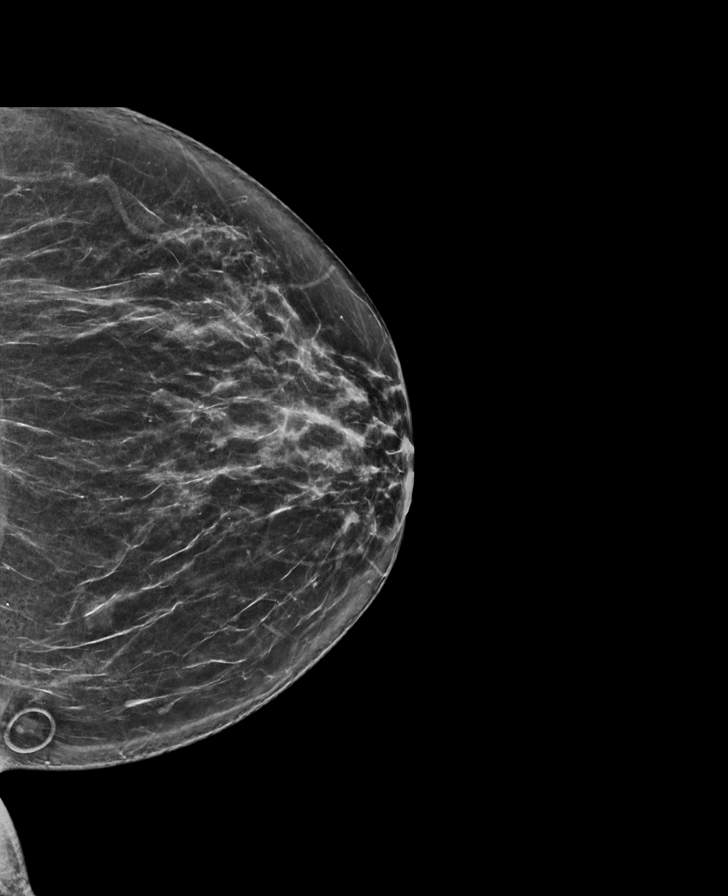

[L MLO synth-2D]
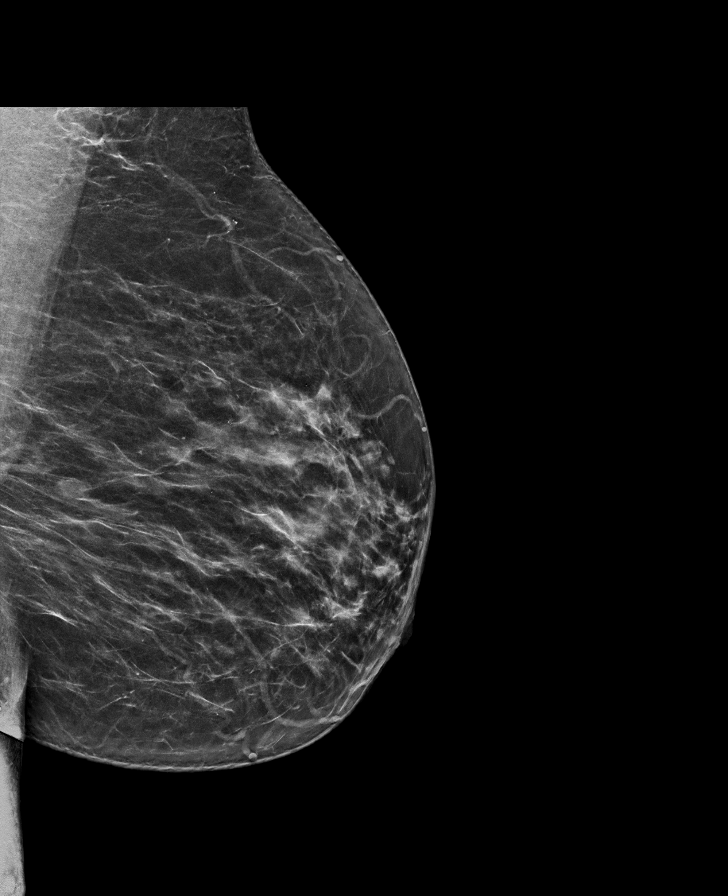

[R CC synth-2D]
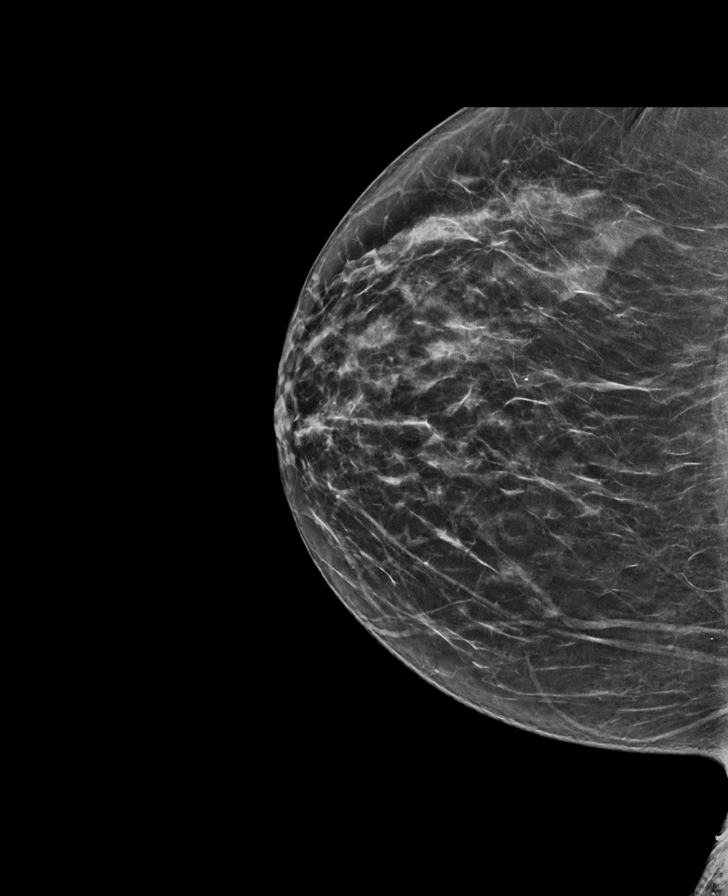

[R MLO synth-2D]
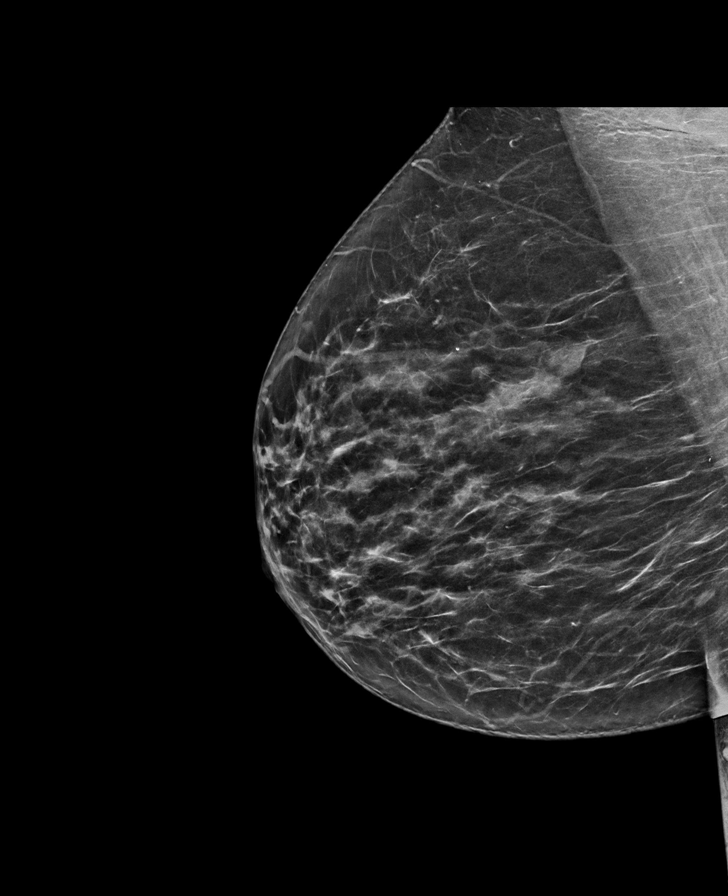

[L CC tomo · tomo slice 35/70.0]
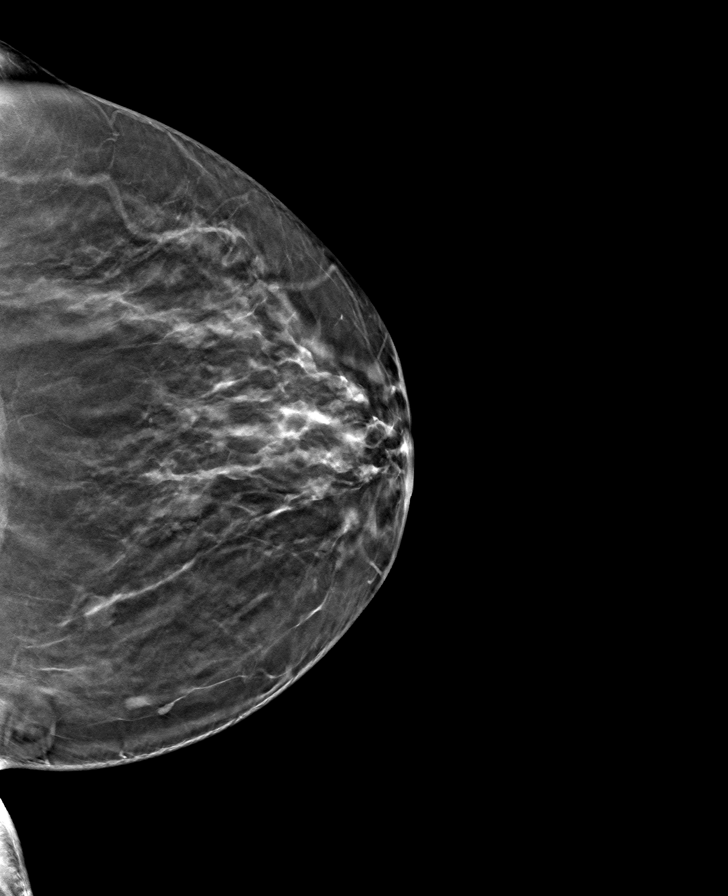

[R MLO tomo · tomo slice 37/74.0]
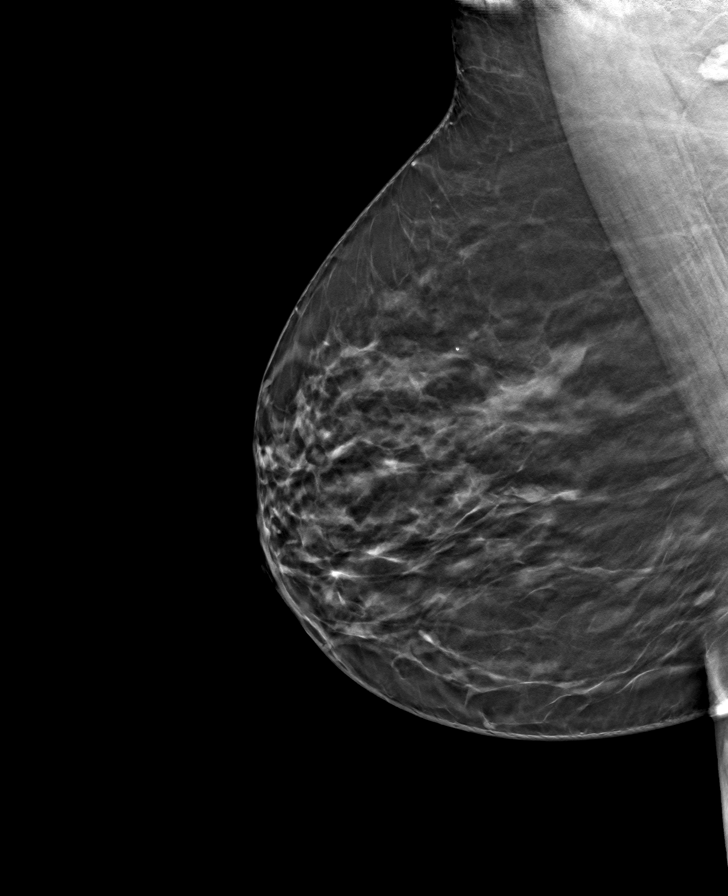

[R CC tomo · tomo slice 35/68.0]
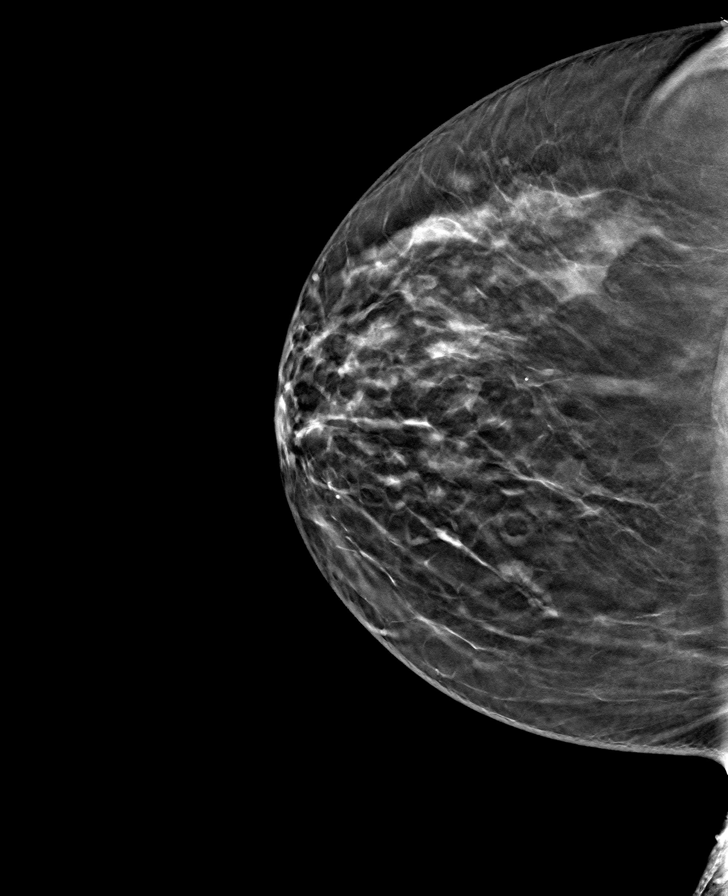

[L MLO tomo · tomo slice 37/74.0]
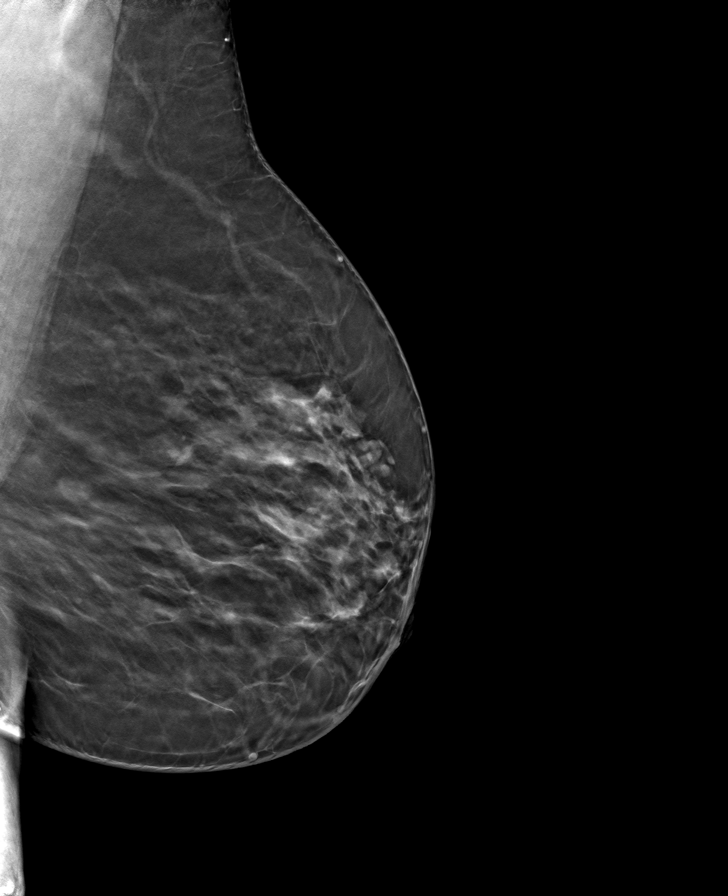

[8 of 24 positions shown; findings below may reference images not displayed]

ACR Breast Density Category b: There are scattered areas of
fibroglandular density.
FINDINGS: In the right breast, a possible asymmetry warrants further
evaluation. In the left breast, no findings suspicious for
malignancy. Images were processed with CAD.
IMPRESSION: Further evaluation is suggested for possible asymmetry in the right
breast.

RECOMMENDATION:
Diagnostic mammogram and possibly ultrasound of the right breast.
(Code:[id number]-55T)

The patient will be contacted regarding the findings, and additional
imaging will be scheduled.

BI-RADS CATEGORY  0: Incomplete. Need additional imaging evaluation
and/or prior mammograms for comparison.

## 2020-12-14 ENCOUNTER — Telehealth: Payer: Self-pay | Admitting: Orthopaedic Surgery

## 2020-12-14 NOTE — Telephone Encounter (Signed)
Pt submitted medical release form and $25.00 cash payment to [employer]. Pt stated she spoke with Tammy from [employer] and FMLA forms was faxed. Accepted 12/14/20

## 2020-12-16 ENCOUNTER — Other Ambulatory Visit: Payer: Self-pay

## 2020-12-20 ENCOUNTER — Encounter: Payer: 59 | Admitting: Medical

## 2020-12-22 NOTE — Progress Notes (Signed)
Sent message, via epic in basket, requesting orders in epic from surgeon.  

## 2020-12-23 ENCOUNTER — Other Ambulatory Visit: Payer: Self-pay | Admitting: Physician Assistant

## 2020-12-23 DIAGNOSIS — M1711 Unilateral primary osteoarthritis, right knee: Secondary | ICD-10-CM

## 2020-12-25 DIAGNOSIS — Z23 Encounter for immunization: Secondary | ICD-10-CM | POA: Diagnosis not present

## 2020-12-25 DIAGNOSIS — Z7185 Encounter for immunization safety counseling: Secondary | ICD-10-CM | POA: Diagnosis not present

## 2020-12-27 ENCOUNTER — Other Ambulatory Visit: Payer: Self-pay

## 2020-12-27 ENCOUNTER — Ambulatory Visit (INDEPENDENT_AMBULATORY_CARE_PROVIDER_SITE_OTHER): Payer: 59 | Admitting: Medical

## 2020-12-27 ENCOUNTER — Encounter: Payer: Self-pay | Admitting: Medical

## 2020-12-27 VITALS — BP 130/78 | HR 66 | Ht 64.0 in | Wt 223.0 lb

## 2020-12-27 DIAGNOSIS — E876 Hypokalemia: Secondary | ICD-10-CM

## 2020-12-27 DIAGNOSIS — R7301 Impaired fasting glucose: Secondary | ICD-10-CM

## 2020-12-27 DIAGNOSIS — E781 Pure hyperglyceridemia: Secondary | ICD-10-CM

## 2020-12-27 DIAGNOSIS — E611 Iron deficiency: Secondary | ICD-10-CM

## 2020-12-27 DIAGNOSIS — Z7185 Encounter for immunization safety counseling: Secondary | ICD-10-CM

## 2020-12-27 DIAGNOSIS — Z8739 Personal history of other diseases of the musculoskeletal system and connective tissue: Secondary | ICD-10-CM | POA: Insufficient documentation

## 2020-12-27 DIAGNOSIS — Z0000 Encounter for general adult medical examination without abnormal findings: Secondary | ICD-10-CM | POA: Insufficient documentation

## 2020-12-27 DIAGNOSIS — I10 Essential (primary) hypertension: Secondary | ICD-10-CM

## 2020-12-27 DIAGNOSIS — Z6838 Body mass index (BMI) 38.0-38.9, adult: Secondary | ICD-10-CM

## 2020-12-27 DIAGNOSIS — Z131 Encounter for screening for diabetes mellitus: Secondary | ICD-10-CM | POA: Insufficient documentation

## 2020-12-27 DIAGNOSIS — Z136 Encounter for screening for cardiovascular disorders: Secondary | ICD-10-CM | POA: Diagnosis not present

## 2020-12-27 DIAGNOSIS — M170 Bilateral primary osteoarthritis of knee: Secondary | ICD-10-CM

## 2020-12-27 DIAGNOSIS — N3281 Overactive bladder: Secondary | ICD-10-CM | POA: Insufficient documentation

## 2020-12-27 NOTE — Patient Instructions (Addendum)
DUE TO COVID-19 ONLY ONE VISITOR IS ALLOWED TO COME WITH YOU AND STAY IN THE WAITING ROOM ONLY DURING PRE OP AND PROCEDURE DAY OF SURGERY IF YOU ARE GOING HOME AFTER SURGERY. IF YOU ARE SPENDING THE NIGHT 2 PEOPLE MAY VISIT WITH YOU IN YOUR PRIVATE ROOM AFTER SURGERY UNTIL VISITING  HOURS ARE OVER AT 800 PM AND 1  VISITOR  MAY  SPEND THE NIGHT.  Covid test 1/4 at 8:00 am. Pt called.                Kayla Larsen     Your procedure is scheduled on: 01/14/21   Report to [facility] Main  Entrance   Report to admitting at 9:15 AM     Call this number if you have problems the morning of surgery [phone]    No food after midnight.    You may have clear liquid until 9:00AM.    At 8:30 AM drink pre surgery drink.   Nothing by mouth after 9:00 AM.   CLEAR LIQUID DIET   Foods Allowed                                                                     Foods Excluded  Coffee and tea, regular and decaf                             liquids that you cannot  Plain Jell-O any favor except red or purple                                           see through such as: Fruit ices (not with fruit pulp)                                     milk, soups, orange juice  Iced Popsicles                                    All solid food Carbonated beverages, regular and diet                                    Cranberry, grape and apple juices Sports drinks like Gatorade Lightly seasoned clear broth or consume(fat free) Sugar   BRUSH YOUR TEETH MORNING OF SURGERY AND RINSE YOUR MOUTH OUT, NO CHEWING GUM CANDY OR MINTS.     Take these medicines the morning of surgery with A SIP OF WATER: Allopurinol, Tolterodine  Bring mask and tubing if you have one                               You may not have any metal on your body including hair pins and              piercings  Do not  wear jewelry, make-up, lotions, powders or perfumes, deodorant             Do not wear nail polish on your  fingernails.  Do not shave  48 hours prior to surgery.               Do not bring valuables to the hospital. [place].  Contacts, dentures or bridgework may not be worn into surgery.  Leave suitcase in the car. After surgery it may be brought to your room.                 [facility] - Preparing for Surgery Before surgery, you can play an important role.  Because skin is not sterile, your skin needs to be as free of germs as possible.  You can reduce the number of germs on your skin by washing with CHG (chlorahexidine gluconate) soap before surgery.  CHG is an antiseptic cleaner which kills germs and bonds with the skin to continue killing germs even after washing. Please DO NOT use if you have an allergy to CHG or antibacterial soaps.  If your skin becomes reddened/irritated stop using the CHG and inform your nurse when you arrive at Short Stay. Do not shave (including legs and underarms) for at least 48 hours prior to the first CHG shower.  You may shave your face/neck. Please follow these instructions carefully:  1.  Shower with CHG Soap the night before surgery and the  morning of Surgery.  2.  If you choose to wash your hair, wash your hair first as usual with your  normal  shampoo.  3.  After you shampoo, rinse your hair and body thoroughly to remove the  shampoo.                            4.  Use CHG as you would any other liquid soap.  You can apply chg directly  to the skin and wash                       Gently with a scrungie or clean washcloth.  5.  Apply the CHG Soap to your body ONLY FROM THE NECK DOWN.   Do not use on face/ open                           Wound or open sores. Avoid contact with eyes, ears mouth and genitals (private parts).                       Wash face,  Genitals (private parts) with your normal soap.             6.  Wash thoroughly, paying special attention to the area where your surgery  will be  performed.  7.  Thoroughly rinse your body with warm water from the neck down.  8.  DO NOT shower/wash with your normal soap after using and rinsing off  the CHG Soap.                9.  Pat yourself dry with a clean towel.            10.  Wear clean pajamas.  11.  Place clean sheets on your bed the night of your first shower and do not  sleep with pets. Day of Surgery : Do not apply any lotions/deodorants the morning of surgery.  Please wear clean clothes to the hospital/surgery center.  FAILURE TO FOLLOW THESE INSTRUCTIONS MAY RESULT IN THE CANCELLATION OF YOUR SURGERY PATIENT SIGNATURE_________________________________  NURSE SIGNATURE__________________________________  ________________________________________________________________________   Adam Phenix  An incentive spirometer is a tool that can help keep your lungs clear and active. This tool measures how well you are filling your lungs with each breath. Taking long deep breaths may help reverse or decrease the chance of developing breathing (pulmonary) problems (especially infection) following: A long period of time when you are unable to move or be active. BEFORE THE PROCEDURE  If the spirometer includes an indicator to show your best effort, your nurse or respiratory therapist will set it to a desired goal. If possible, sit up straight or lean slightly forward. Try not to slouch. Hold the incentive spirometer in an upright position. INSTRUCTIONS FOR USE  Sit on the edge of your bed if possible, or sit up as far as you can in bed or on a chair. Hold the incentive spirometer in an upright position. Breathe out normally. Place the mouthpiece in your mouth and seal your lips tightly around it. Breathe in slowly and as deeply as possible, raising the piston or the ball toward the top of the column. Hold your breath for 3-5 seconds or for as long as possible. Allow the piston or ball to fall to the bottom of the  column. Remove the mouthpiece from your mouth and breathe out normally. Rest for a few seconds and repeat Steps 1 through 7 at least 10 times every 1-2 hours when you are awake. Take your time and take a few normal breaths between deep breaths. The spirometer may include an indicator to show your best effort. Use the indicator as a goal to work toward during each repetition. After each set of 10 deep breaths, practice coughing to be sure your lungs are clear. If you have an incision (the cut made at the time of surgery), support your incision when coughing by placing a pillow or rolled up towels firmly against it. Once you are able to get out of bed, walk around indoors and cough well. You may stop using the incentive spirometer when instructed by your caregiver.  RISKS AND COMPLICATIONS Take your time so you do not get dizzy or light-headed. If you are in pain, you may need to take or ask for pain medication before doing incentive spirometry. It is harder to take a deep breath if you are having pain. AFTER USE Rest and breathe slowly and easily. It can be helpful to keep track of a log of your progress. Your caregiver can provide you with a simple table to help with this. If you are using the spirometer at home, follow these instructions: [place] IF:  You are having difficultly using the spirometer. You have trouble using the spirometer as often as instructed. Your pain medication is not giving enough relief while using the spirometer. You develop fever of 100.5 F (38.1 C) or higher. SEEK IMMEDIATE MEDICAL CARE IF:  You cough up bloody sputum that had not been present before. You develop fever of 102 F (38.9 C) or greater. You develop worsening pain at or near the incision site. MAKE SURE YOU:  Understand these instructions. Will watch your condition. Will get  help right away if you are not doing well or get worse. Document Released: 05/08/2006 Document Revised: 03/20/2011  Document Reviewed: 07/09/2006 [facility] Patient Information 2014 [place], [place].   ________________________________________________________________________

## 2020-12-27 NOTE — Progress Notes (Signed)
Subjective:   HPI  Kayla Larsen is a 50 y.o. female who presents for Chief Complaint  Patient presents with   fasting cpe    Fasting cpe, would like flu shot today. [facility]     Patient Care Team: Wyndell Cardiff, Leward Quan as PCP - General (Family Medicine) Sees dentist Sees eye doctor GI, Dr. [place] [facility], gynecology Dr. Gershon Mussel, podiatry Dr. Zollie Beckers, Orthopedics   Concerns: Wants flu shot today  Having right knee TKR 01/15/20.   Having a lot of trouble walking on the knee of late.    Denies hx/o chicken pox as a child  HTN - compliant with medication.  No chest pain, no palpitation, no edema.  History of iron deficiency anemia-taking iron daily  Only uses Robaxin as needed  Compliant with Klor-Con daily  Compliant with Detrol daily for overactive bladder  Reviewed their medical, surgical, family, social, medication, and allergy history and updated chart as appropriate.  Past Medical History:  Diagnosis Date   Acanthosis nigricans    Anemia    Arthritis of knee    External hemorrhoid    History of blood transfusion 2009   2 units after hysterectomy   Hypertension    Obesity    Plantar fasciitis of left foot    Sleep apnea 2010   borderline no cpap used   Wears glasses     Past Surgical History:  Procedure Laterality Date   CESAREAN SECTION     x 2   COLONOSCOPY  2009   done for anemia eval; Dr. Benson [place]   COLONOSCOPY  01/25/2018   multiple aphthous ulcers in terminal ileum likely d/t NSAIDs, diverticulosis in sigmoid colon, Dr.  Cellar   KNEE SURGERY  10/2015   knot removed from R knee   LAPAROTOMY Left 07/30/2012   Procedure: LEFT SALPINGO OOPHERECTOMY,  OMENTECTOMY, AND LYSIS OF ADHESIONS;  Surgeon: Alvino Chapel, MD;  Location: [facility];  Service: Gynecology;  Laterality: Left;   OOPHORECTOMY  2014   left    removed a cyst on her ovaries  2014   TOTAL ABDOMINAL HYSTERECTOMY  2009      Family History  Problem Relation Age of Onset   Cancer Mother        liver and lung   Hypertension Mother    Diabetes Father    Kidney failure Father    Hypertension Brother    Hypertension Brother    Hypertension Brother    Cancer Maternal Aunt        thyroid   Diabetes Maternal Aunt    Hypertension Maternal Aunt    Heart disease Maternal Grandmother    Diabetes Maternal Uncle    Hypertension Maternal Uncle    Stroke Neg Hx      Current Outpatient Medications:    allopurinol (ZYLOPRIM) 100 MG tablet, Take 100 mg by mouth daily., Disp: , Rfl:    Cholecalciferol (VITAMIN D) 50 MCG (2000 UT) tablet, Take 2,000 Units by mouth daily., Disp: , Rfl:    CRANBERRY PO, Take 1 capsule by mouth daily., Disp: , Rfl:    diclofenac (VOLTAREN) 75 MG EC tablet, TAKE 1 TABLET(75 MG) BY MOUTH TWICE DAILY (Patient taking differently: Take 75 mg by mouth daily.), Disp: 60 tablet, Rfl: 3   diclofenac Sodium (VOLTAREN) 1 % GEL, Apply 1 application topically daily as needed (pain)., Disp: , Rfl:    ferrous sulfate 325 (65 FE) MG tablet, Take 325 mg by mouth  daily., Disp: , Rfl:    Multiple Vitamin (MULTIVITAMIN WITH MINERALS) TABS, Take 1 tablet by mouth daily., Disp: , Rfl:    Olmesartan-amLODIPine-HCTZ 40-5-12.5 MG TABS, TAKE 1 TABLET BY MOUTH DAILY, Disp: 90 tablet, Rfl: 0   potassium chloride (KLOR-CON) 10 MEQ tablet, TAKE 1 TABLET(10 MEQ) BY MOUTH DAILY, Disp: 90 tablet, Rfl: 1   tolterodine (DETROL LA) 4 MG 24 hr capsule, TAKE 1 CAPSULE(4 MG) BY MOUTH DAILY, Disp: 90 capsule, Rfl: 0   TURMERIC PO, Take 2 capsules by mouth daily., Disp: , Rfl:   Allergies  Allergen Reactions   Amlodipine     Ankle swelling at 10mg    Lisinopril     Cough      Review of Systems Constitutional: -fever, -chills, -sweats, -unexpected weight change, -decreased appetite, -fatigue Allergy: -sneezing, -itching, -congestion Dermatology: -changing moles, --rash, -lumps ENT: -runny nose, -ear pain, -sore  throat, -hoarseness, -sinus pain, -teeth pain, - ringing in ears, -hearing loss, -nosebleeds Cardiology: -chest pain, -palpitations, -swelling, -difficulty breathing when lying flat, -waking up short of breath Respiratory: -cough, -shortness of breath, -difficulty breathing with exercise or exertion, -wheezing, -coughing up blood Gastroenterology: -abdominal pain, -nausea, -vomiting, -diarrhea, -constipation, -blood in stool, -changes in bowel movement, -difficulty swallowing or eating Hematology: -bleeding, -bruising  Musculoskeletal: +joint aches, -muscle aches, -joint swelling, -back pain, -neck pain, -cramping, -changes in gait Ophthalmology: denies vision changes, eye redness, itching, discharge Urology: -burning with urination, -difficulty urinating, -blood in urine, -urinary frequency, -urgency, -incontinence Neurology: -headache, -weakness, -tingling, -numbness, -memory loss, -falls, -dizziness Psychology: -depressed mood, -agitation, -sleep problems Breast/gyn: -breast tendnerss, -discharge, -lumps, -vaginal discharge,- irregular periods, -heavy periods     Objective:  BP 130/78    Pulse 66    Ht 5\' 4"  (1.626 m)    Wt 223 lb (101.2 kg)    BMI 38.28 kg/m   BP Readings from Last 3 Encounters:  12/27/20 130/78  11/20/19 116/74  06/24/19 138/84   Wt Readings from Last 3 Encounters:  12/27/20 223 lb (101.2 kg)  11/16/20 225 lb (102.1 kg)  03/10/20 221 lb (100.2 kg)    General appearance: alert, no distress, WD/WN, African American female Skin: unremarkable, tattoos right volar distal forearm, right lateral forearm, posterior neck/upper back HEENT: normocephalic, conjunctiva/corneas normal, sclerae anicteric, PERRLA, EOMi, nares patent, no discharge or erythema, pharynx normal, moderately enlarged tonsils bilat Oral cavity: MMM, tongue normal, teeth in good repair, no small airway Neck: supple, no lymphadenopathy, no thyromegaly, no masses, normal ROM, no bruits Chest: non tender,  normal shape and expansion Heart: RRR, normal S1, S2, no murmurs Lungs: CTA bilaterally, no wheezes, rhonchi, or rales Abdomen: +bs, soft, non tender, non distended, no masses, no hepatomegaly, no splenomegaly, no bruits Back: non tender, normal ROM, no scoliosis Musculoskeletal: limited exam unremarkable, cautious with right knee, cautious gait given right knee pains Extremities: no edema, no cyanosis, no clubbing Pulses: 2+ symmetric, upper and 1+ bilat lower extremities, normal cap refill Neurological: alert, oriented x 3, CN2-12 intact, strength normal upper extremities and lower extremities, sensation normal throughout, DTRs 2+ throughout, no cerebellar signs, gait normal Psychiatric: normal affect, behavior normal, pleasant  Breast/gyn/rectal - deferred to gynecology  EKG Indication physical, screen heart disease, rate 56 bpm, PR 172 ms, QRS 82 ms, QTC 430 ms, axis 40 degrees, sinus bradycardia, nonspecific T wave inversion 3, unchanged from prior EKGs in 2018    Assessment and Plan :   Encounter Diagnoses  Name Primary?   Encounter for health maintenance examination in  adult Yes   Screening for heart disease    Impaired fasting blood sugar    Hypertriglyceridemia    Essential hypertension    Primary osteoarthritis of both knees    Vaccine counseling    History of gout    Iron deficiency    Screening for diabetes mellitus    BMI 38.0-38.9,adult    OAB (overactive bladder)    Hypokalemia     Physical exam - discussed and counseled on healthy lifestyle, diet, exercise, preventative care, vaccinations, sick and well care, proper use of emergency dept and after hours care, and addressed their concerns.    Health screening: Advised they see their eye doctor yearly for routine vision care. Advised they see their dentist yearly for routine dental care including hygiene visits twice yearly. See your gynecologist yearly for routine gynecological care.   Cancer  screening: Counseled on self breast exams, mammograms, cervical cancer screening  Colonoscopy:  Reviewed 01/25/18 colonoscopy showing multiple aphthous ulcers in terminal ileum likely due to NSAIDs, diverticulosis in sigmoid colon, Dr. [place] Cellar  Reviewed mammogram on file that is up to date  She is s/p hysterectomy  Use skin surveillance    Vaccinations: Immunization History  Administered Date(s) Administered   Influenza,inj,Quad PF,6+ Mos 09/10/2013, 09/30/2014, 09/08/2015, 09/25/2017, 09/18/2018   Influenza-Unspecified 09/19/2016, 09/25/2017, 10/25/2019   PFIZER(Purple Top)SARS-COV-2 Vaccination 03/14/2019, 04/09/2019   Tdap 02/12/2013   Counseled on the influenza virus vaccine.  Vaccine information sheet given.  Influenza vaccine given after consent obtained.  Shingles vaccine:  I recommend you have a shingles vaccine to help prevent shingles or herpes zoster outbreak.   Please call your insurer to inquire about coverage for the Shingrix vaccine given in 2 doses.   Some insurers cover this vaccine after age 2, some cover this after age 44.  If your insurer covers this, then call to schedule appointment to have this vaccine here.   Separate significant chronic issues discussed: Hypertension-continue current medication olmesartan amlodipine HCTZ 40/5/12.5 mg daily  Obesity-counseled on diet and exercise recommendations with efforts to lose weight  Vitamin D deficiency-continue supplement and eat fish regularly  Overactive bladder-continue Detrol as usual  Arthritis-sees ortho and podiatry, upcoming TKR right knee in January 2023  History of gout-on allopurinol for prevention  Impaired glucose-updated labs today  She will have preop labs for basic metabolic panel and CBC in 5 days on Friday.  We will review those as well  Prior hypokalemia-updated labs today, continue potassium  Iron deficiency-currently on iron therapy daily.  I reviewed her 2020 colonoscopy.   Await lab results for further recommendations  Advised avoidance of NSAIDs given prior ulcers on colonoscopy thought to be due to NSAID use   Tejasvi was seen today for fasting cpe.  Diagnoses and all orders for this visit:  Encounter for health maintenance examination in adult -     Iron, TIBC and Ferritin Panel -     Hemoglobin A1c -     EKG 12-Lead -     Lipid panel -     Uric acid -     Hepatic function panel  Screening for heart disease -     EKG 12-Lead  Impaired fasting blood sugar -     Hemoglobin A1c  Hypertriglyceridemia  Essential hypertension  Primary osteoarthritis of both knees  Vaccine counseling  History of gout -     Uric acid  Iron deficiency -     Iron, TIBC and Ferritin Panel  Screening for diabetes  mellitus  BMI 38.0-38.9,adult  OAB (overactive bladder)  Hypokalemia    Follow-up pending labs, yearly for physical

## 2020-12-28 ENCOUNTER — Telehealth: Payer: Self-pay | Admitting: Orthopaedic Surgery

## 2020-12-28 LAB — HEPATIC FUNCTION PANEL
ALT: 17 IU/L (ref 0–32)
AST: 13 IU/L (ref 0–40)
Albumin: 4.4 g/dL (ref 3.8–4.9)
Alkaline Phosphatase: 48 IU/L (ref 44–121)
Bilirubin Total: 0.5 mg/dL (ref 0.0–1.2)
Bilirubin, Direct: 0.13 mg/dL (ref 0.00–0.40)
Total Protein: 6.8 g/dL (ref 6.0–8.5)

## 2020-12-28 LAB — IRON,TIBC AND FERRITIN PANEL
Ferritin: 427 ng/mL — ABNORMAL HIGH (ref 15–150)
Iron Saturation: 35 % (ref 15–55)
Iron: 84 ug/dL (ref 27–159)
Total Iron Binding Capacity: 239 ug/dL — ABNORMAL LOW (ref 250–450)
UIBC: 155 ug/dL (ref 131–425)

## 2020-12-28 LAB — LIPID PANEL
Chol/HDL Ratio: 3.7 ratio (ref 0.0–4.4)
Cholesterol, Total: 193 mg/dL (ref 100–199)
HDL: 52 mg/dL (ref >39)
LDL Chol Calc (NIH): 118 mg/dL — ABNORMAL HIGH (ref 0–99)
Triglycerides: 130 mg/dL (ref 0–149)
VLDL Cholesterol Cal: 23 mg/dL (ref 5–40)

## 2020-12-28 LAB — HEMOGLOBIN A1C
Est. average glucose Bld gHb Est-mCnc: 120 mg/dL
Hgb A1c MFr Bld: 5.8 % — ABNORMAL HIGH (ref 4.8–5.6)

## 2020-12-28 LAB — URIC ACID: Uric Acid: 4.8 mg/dL (ref 3.0–7.2)

## 2020-12-28 NOTE — Telephone Encounter (Signed)
[employer] forms received. To [employer].

## 2020-12-31 ENCOUNTER — Encounter (HOSPITAL_COMMUNITY)
Admission: RE | Admit: 2020-12-31 | Discharge: 2020-12-31 | Disposition: A | Discharge status: Home / Self Care | Payer: 59 | Source: Ambulatory Visit | Attending: Orthopaedic Surgery | Admitting: Orthopaedic Surgery

## 2020-12-31 ENCOUNTER — Encounter (HOSPITAL_COMMUNITY): Payer: Self-pay

## 2020-12-31 ENCOUNTER — Other Ambulatory Visit: Payer: Self-pay

## 2020-12-31 VITALS — BP 161/84 | HR 64 | Temp 98.6°F | Resp 18 | Ht 64.0 in | Wt 220.0 lb

## 2020-12-31 DIAGNOSIS — Z01818 Encounter for other preprocedural examination: Secondary | ICD-10-CM

## 2020-12-31 DIAGNOSIS — M1711 Unilateral primary osteoarthritis, right knee: Secondary | ICD-10-CM

## 2020-12-31 DIAGNOSIS — I10 Essential (primary) hypertension: Secondary | ICD-10-CM

## 2020-12-31 DIAGNOSIS — Z01812 Encounter for preprocedural laboratory examination: Secondary | ICD-10-CM | POA: Insufficient documentation

## 2020-12-31 LAB — CBC
HCT: 32.5 % — ABNORMAL LOW (ref 36.0–46.0)
Hemoglobin: 10 g/dL — ABNORMAL LOW (ref 12.0–15.0)
MCH: 23.2 pg — ABNORMAL LOW (ref 26.0–34.0)
MCHC: 30.8 g/dL (ref 30.0–36.0)
MCV: 75.4 fL — ABNORMAL LOW (ref 80.0–100.0)
Platelets: 306 10*3/uL (ref 150–400)
RBC: 4.31 MIL/uL (ref 3.87–5.11)
RDW: 15.6 % — ABNORMAL HIGH (ref 11.5–15.5)
WBC: 5.8 10*3/uL (ref 4.0–10.5)
nRBC: 0 % (ref 0.0–0.2)

## 2020-12-31 LAB — BASIC METABOLIC PANEL
Anion gap: 7 (ref 5–15)
BUN: 18 mg/dL (ref 6–20)
CO2: 24 mmol/L (ref 22–32)
Calcium: 9.3 mg/dL (ref 8.9–10.3)
Chloride: 109 mmol/L (ref 98–111)
Creatinine, Ser: 0.71 mg/dL (ref 0.44–1.00)
GFR, Estimated: >60 mL/min (ref >60)
Glucose, Bld: 97 mg/dL (ref 70–99)
Potassium: 3.3 mmol/L — ABNORMAL LOW (ref 3.5–5.1)
Sodium: 140 mmol/L (ref 135–145)

## 2020-12-31 LAB — SURGICAL PCR SCREEN
MRSA, PCR: NEGATIVE
Staphylococcus aureus: NEGATIVE

## 2020-12-31 NOTE — Progress Notes (Signed)
COVID test- 1/4  PCP - D. Tysinger Cardiologist - none  Chest x-ray - no EKG - 12/17/20-epic Stress Test - no ECHO - no Cardiac Cath - no Pacemaker/ICD device last checked:NA  Sleep Study - yes CPAP - no  Fasting Blood Sugar - NA Checks Blood Sugar _____ times a day  Blood Thinner Instructions:NA Aspirin Instructions: Last Dose:  Anesthesia review: no  Patient denies shortness of breath, fever, cough and chest pain at PAT appointment Pt has no SOB with activities. She has been in a brace and doesn't climb stairs.  Patient verbalized understanding of instructions that were given to them at the PAT appointment. Patient was also instructed that they will need to review over the PAT instructions again at home before surgery. yes

## 2021-01-04 ENCOUNTER — Telehealth: Payer: Self-pay | Admitting: Orthopaedic Surgery

## 2021-01-04 NOTE — Telephone Encounter (Signed)
Pt submitted second set of medical release form, extension of FMLA forms, and $25.00 cash payment to [employer]. Accepted 01/04/21

## 2021-01-12 ENCOUNTER — Other Ambulatory Visit: Payer: Self-pay

## 2021-01-12 ENCOUNTER — Encounter (HOSPITAL_COMMUNITY)
Admission: RE | Admit: 2021-01-12 | Discharge: 2021-01-12 | Disposition: A | Discharge status: Home / Self Care | Payer: 59 | Source: Ambulatory Visit | Attending: Orthopaedic Surgery | Admitting: Orthopaedic Surgery

## 2021-01-12 DIAGNOSIS — Z20822 Contact with and (suspected) exposure to covid-19: Secondary | ICD-10-CM | POA: Insufficient documentation

## 2021-01-12 DIAGNOSIS — Z01812 Encounter for preprocedural laboratory examination: Secondary | ICD-10-CM | POA: Diagnosis not present

## 2021-01-12 DIAGNOSIS — Z01818 Encounter for other preprocedural examination: Secondary | ICD-10-CM

## 2021-01-12 LAB — SARS CORONAVIRUS 2 (TAT 6-24 HRS): SARS Coronavirus 2: NEGATIVE

## 2021-01-13 NOTE — H&P (Signed)
TOTAL KNEE ADMISSION H&P  Patient is being admitted for right total knee arthroplasty.  Subjective:  Chief Complaint:right knee pain.  HPI: VERNETTE Larsen, 51 y.o. female, has a history of pain and functional disability in the right knee due to arthritis and has failed non-surgical conservative treatments for greater than 12 weeks to includeNSAID's and/or analgesics, corticosteriod injections, viscosupplementation injections, flexibility and strengthening excercises, use of assistive devices, weight reduction as appropriate, and activity modification.  Onset of symptoms was gradual, starting 6 years ago with gradually worsening course since that time. The patient noted no past surgery on the right knee(s).  Patient currently rates pain in the right knee(s) at 10 out of 10 with activity. Patient has night pain, worsening of pain with activity and weight bearing, pain that interferes with activities of daily living, pain with passive range of motion, crepitus, and joint swelling.  Patient has evidence of subchondral sclerosis, periarticular osteophytes, and joint space narrowing by imaging studies. There is no active infection.  Patient Active Problem List   Diagnosis Date Noted   Encounter for health maintenance examination in adult 12/27/2020   Screening for heart disease 12/27/2020   History of gout 12/27/2020   Iron deficiency 12/27/2020   Screening for diabetes mellitus 12/27/2020   BMI 38.0-38.9,adult 12/27/2020   OAB (overactive bladder) 12/27/2020   Hypokalemia 12/27/2020   Primary osteoarthritis of right knee 11/16/2020   H/O: hysterectomy 09/21/2020   Vaccine counseling 11/20/2019   Encounter for screening mammogram for malignant neoplasm of breast 11/20/2019   Gout of foot 11/20/2019   Encounter for hepatitis C screening test for low risk patient 11/20/2019   Primary osteoarthritis of both knees 05/14/2019   Diverticulosis 11/19/2018   Vitamin D deficiency 11/24/2016   S/P  right knee arthroscopy 12/13/2015   Obesity with serious comorbidity 05/03/2015   Overactive bladder 05/03/2015   Essential hypertension 05/03/2015   Routine general medical examination at a health care facility 05/03/2015   Arthritis, senescent 05/03/2015   Hypertriglyceridemia 05/03/2015   Impaired fasting blood sugar 05/03/2015   Past Medical History:  Diagnosis Date   Acanthosis nigricans    Anemia    Arthritis of knee    External hemorrhoid    History of blood transfusion 2009   2 units after hysterectomy   Hypertension    Obesity    Plantar fasciitis of left foot    Sleep apnea 2010   borderline no cpap used   Wears glasses     Past Surgical History:  Procedure Laterality Date   CESAREAN SECTION     x 2   COLONOSCOPY  2009   done for anemia eval; Dr. Benson Norway   COLONOSCOPY  01/25/2018   multiple aphthous ulcers in terminal ileum likely d/t NSAIDs, diverticulosis in sigmoid colon, Dr. [place] Cellar   KNEE SURGERY  10/2015   knot removed from R knee   LAPAROTOMY Left 07/30/2012   Procedure: LEFT SALPINGO OOPHERECTOMY,  OMENTECTOMY, AND LYSIS OF ADHESIONS;  Surgeon: Alvino Chapel, MD;  Location: [facility];  Service: Gynecology;  Laterality: Left;   OOPHORECTOMY  2014   left    TOTAL ABDOMINAL HYSTERECTOMY  2009    No current facility-administered medications for this encounter.   Current Outpatient Medications  Medication Sig Dispense Refill Last Dose   allopurinol (ZYLOPRIM) 100 MG tablet Take 100 mg by mouth daily.      Cholecalciferol (VITAMIN D) 50 MCG (2000 UT) tablet Take 2,000 Units by mouth daily.  CRANBERRY PO Take 1 capsule by mouth daily.      diclofenac (VOLTAREN) 75 MG EC tablet TAKE 1 TABLET(75 MG) BY MOUTH TWICE DAILY (Patient taking differently: Take 75 mg by mouth daily.) 60 tablet 3    diclofenac Sodium (VOLTAREN) 1 % GEL Apply 1 application topically daily as needed (pain).      ferrous sulfate 325 (65 FE) MG tablet Take 325 mg by  mouth daily.      Multiple Vitamin (MULTIVITAMIN WITH MINERALS) TABS Take 1 tablet by mouth daily.      Olmesartan-amLODIPine-HCTZ 40-5-12.5 MG TABS TAKE 1 TABLET BY MOUTH DAILY 90 tablet 0    potassium chloride (KLOR-CON) 10 MEQ tablet TAKE 1 TABLET(10 MEQ) BY MOUTH DAILY 90 tablet 1    tolterodine (DETROL LA) 4 MG 24 hr capsule TAKE 1 CAPSULE(4 MG) BY MOUTH DAILY 90 capsule 0    TURMERIC PO Take 2 capsules by mouth daily.      Allergies  Allergen Reactions   Amlodipine     Ankle swelling at 10mg    Lisinopril     Cough     Social History   Tobacco Use   Smoking status: Former    Packs/day: 0.25    Years: 4.00    Pack years: 1.00    Types: Cigarettes    Quit date: 09/29/2011    Years since quitting: 9.2   Smokeless tobacco: Never  Substance Use Topics   Alcohol use: No    Alcohol/week: 2.0 standard drinks    Types: 2 Shots of liquor per week    Family History  Problem Relation Age of Onset   Cancer Mother        liver and lung   Hypertension Mother    Diabetes Father    Kidney failure Father    Hypertension Brother    Hypertension Brother    Hypertension Brother    Cancer Maternal Aunt        thyroid   Diabetes Maternal Aunt    Hypertension Maternal Aunt    Heart disease Maternal Grandmother    Diabetes Maternal Uncle    Hypertension Maternal Uncle    Stroke Neg Hx      Review of Systems  Musculoskeletal:  Positive for gait problem and joint swelling.  All other systems reviewed and are negative.  Objective:  Physical Exam Vitals reviewed.  Constitutional:      Appearance: Normal appearance.  HENT:     Head: Normocephalic and atraumatic.  Eyes:     Extraocular Movements: Extraocular movements intact.     Pupils: Pupils are equal, round, and reactive to light.  Cardiovascular:     Rate and Rhythm: Normal rate and regular rhythm.     Pulses: Normal pulses.  Pulmonary:     Effort: Pulmonary effort is normal.  Abdominal:     Palpations: Abdomen is  soft.  Musculoskeletal:     Cervical back: Normal range of motion and neck supple.     Right knee: Effusion, bony tenderness and crepitus present. Decreased range of motion. Tenderness present over the medial joint line, lateral joint line and patellar tendon. Abnormal alignment.  Neurological:     Mental Status: She is alert and oriented to person, place, and time.  Psychiatric:        Behavior: Behavior normal.    Vital signs in last 24 hours:    Labs:   Estimated body mass index is 37.76 kg/m as calculated from the following:   Height  as of 12/31/20: 5\' 4"  (1.626 m).   Weight as of 12/31/20: 99.8 kg.   Imaging Review Plain radiographs demonstrate severe degenerative joint disease of the right knee(s). The overall alignment issignificant varus. The bone quality appears to be good for age and reported activity level.      Assessment/Plan:  End stage arthritis, right knee   The patient history, physical examination, clinical judgment of the provider and imaging studies are consistent with end stage degenerative joint disease of the right knee(s) and total knee arthroplasty is deemed medically necessary. The treatment options including medical management, injection therapy arthroscopy and arthroplasty were discussed at length. The risks and benefits of total knee arthroplasty were presented and reviewed. The risks due to aseptic loosening, infection, stiffness, patella tracking problems, thromboembolic complications and other imponderables were discussed. The patient acknowledged the explanation, agreed to proceed with the plan and consent was signed. Patient is being admitted for inpatient treatment for surgery, pain control, PT, OT, prophylactic antibiotics, VTE prophylaxis, progressive ambulation and ADL's and discharge planning. The patient is planning to be discharged home with home health services

## 2021-01-14 ENCOUNTER — Encounter (HOSPITAL_COMMUNITY): Payer: Self-pay | Admitting: Orthopaedic Surgery

## 2021-01-14 ENCOUNTER — Ambulatory Visit (HOSPITAL_COMMUNITY): Payer: 59 | Admitting: Anesthesiology

## 2021-01-14 ENCOUNTER — Encounter (HOSPITAL_COMMUNITY)
Admission: RE | Disposition: A | Discharge status: Home / Self Care | Payer: Self-pay | Source: Ambulatory Visit | Attending: Orthopaedic Surgery

## 2021-01-14 ENCOUNTER — Observation Stay (HOSPITAL_COMMUNITY): Payer: 59

## 2021-01-14 ENCOUNTER — Other Ambulatory Visit: Payer: Self-pay

## 2021-01-14 ENCOUNTER — Observation Stay (HOSPITAL_COMMUNITY)
Admission: RE | Admit: 2021-01-14 | Discharge: 2021-01-17 | Disposition: A | Discharge status: Home / Self Care | Payer: 59 | Source: Ambulatory Visit | Attending: Orthopaedic Surgery | Admitting: Orthopaedic Surgery

## 2021-01-14 DIAGNOSIS — I10 Essential (primary) hypertension: Secondary | ICD-10-CM | POA: Insufficient documentation

## 2021-01-14 DIAGNOSIS — Z87891 Personal history of nicotine dependence: Secondary | ICD-10-CM | POA: Diagnosis not present

## 2021-01-14 DIAGNOSIS — Z01818 Encounter for other preprocedural examination: Secondary | ICD-10-CM

## 2021-01-14 DIAGNOSIS — Z96651 Presence of right artificial knee joint: Secondary | ICD-10-CM

## 2021-01-14 DIAGNOSIS — Z79899 Other long term (current) drug therapy: Secondary | ICD-10-CM | POA: Insufficient documentation

## 2021-01-14 DIAGNOSIS — M1711 Unilateral primary osteoarthritis, right knee: Secondary | ICD-10-CM | POA: Diagnosis present

## 2021-01-14 HISTORY — PX: TOTAL KNEE ARTHROPLASTY: SHX125

## 2021-01-14 LAB — TYPE AND SCREEN
ABO/RH(D): B POS
Antibody Screen: NEGATIVE

## 2021-01-14 SURGERY — ARTHROPLASTY, KNEE, TOTAL
Anesthesia: Spinal | Site: Knee | Laterality: Right

## 2021-01-14 MED ORDER — LACTATED RINGERS IV SOLN: INTRAVENOUS | Status: DC

## 2021-01-14 MED ORDER — ASPIRIN 81 MG PO CHEW
81.0000 mg | CHEWABLE_TABLET | Freq: Two times a day (BID) | ORAL | Status: DC
  Administered 2021-01-15 – 2021-01-17 (×5): 81 mg via ORAL
  Filled 2021-01-14 (×5): qty 1

## 2021-01-14 MED ORDER — METHOCARBAMOL 500 MG PO TABS
500.0000 mg | ORAL_TABLET | Freq: Four times a day (QID) | ORAL | Status: DC | PRN
  Administered 2021-01-14 – 2021-01-17 (×9): 500 mg via ORAL
  Filled 2021-01-14 (×10): qty 1

## 2021-01-14 MED ORDER — SODIUM CHLORIDE 0.9 % IV SOLN: INTRAVENOUS | Status: DC

## 2021-01-14 MED ORDER — 0.9 % SODIUM CHLORIDE (POUR BTL) OPTIME
TOPICAL | Status: DC | PRN
  Administered 2021-01-14: 1000 mL

## 2021-01-14 MED ORDER — ONDANSETRON HCL 4 MG/2ML IJ SOLN
INTRAMUSCULAR | Status: AC
  Filled 2021-01-14: qty 2

## 2021-01-14 MED ORDER — CEFAZOLIN SODIUM-DEXTROSE 2-4 GM/100ML-% IV SOLN
2.0000 g | INTRAVENOUS | Status: AC
  Administered 2021-01-14: 2 g via INTRAVENOUS
  Filled 2021-01-14: qty 100

## 2021-01-14 MED ORDER — ACETAMINOPHEN ER 650 MG PO TBCR: 650.0000 mg | EXTENDED_RELEASE_TABLET | Freq: Every day | ORAL | Status: DC

## 2021-01-14 MED ORDER — ORAL CARE MOUTH RINSE: 15.0000 mL | Freq: Once | OROMUCOSAL | Status: AC

## 2021-01-14 MED ORDER — BUPIVACAINE IN DEXTROSE 0.75-8.25 % IT SOLN
INTRATHECAL | Status: DC | PRN
Start: 2021-01-14 — End: 2021-01-14
  Administered 2021-01-14: 1.6 mL via INTRATHECAL

## 2021-01-14 MED ORDER — ONDANSETRON HCL 4 MG/2ML IJ SOLN
INTRAMUSCULAR | Status: DC | PRN
Start: 2021-01-14 — End: 2021-01-14
  Administered 2021-01-14: 4 mg via INTRAVENOUS

## 2021-01-14 MED ORDER — HYDROMORPHONE HCL 1 MG/ML IJ SOLN
0.5000 mg | INTRAMUSCULAR | Status: DC | PRN
  Administered 2021-01-14 – 2021-01-15 (×4): 1 mg via INTRAVENOUS
  Filled 2021-01-14 (×4): qty 1

## 2021-01-14 MED ORDER — PROPOFOL 500 MG/50ML IV EMUL
INTRAVENOUS | Status: AC
  Filled 2021-01-14: qty 50

## 2021-01-14 MED ORDER — HYDROMORPHONE HCL 1 MG/ML IJ SOLN
0.2500 mg | INTRAMUSCULAR | Status: DC | PRN
  Administered 2021-01-14: 0.5 mg via INTRAVENOUS

## 2021-01-14 MED ORDER — CHLORHEXIDINE GLUCONATE 0.12 % MT SOLN
15.0000 mL | Freq: Once | OROMUCOSAL | Status: AC
  Administered 2021-01-14: 15 mL via OROMUCOSAL

## 2021-01-14 MED ORDER — PROMETHAZINE HCL 25 MG/ML IJ SOLN: 6.2500 mg | INTRAMUSCULAR | Status: DC | PRN

## 2021-01-14 MED ORDER — CLONIDINE HCL (ANALGESIA) 100 MCG/ML EP SOLN
EPIDURAL | Status: DC | PRN
Start: 2021-01-14 — End: 2021-01-14
  Administered 2021-01-14: 100 ug

## 2021-01-14 MED ORDER — ALUM & MAG HYDROXIDE-SIMETH 200-200-20 MG/5ML PO SUSP: 30.0000 mL | ORAL | Status: DC | PRN

## 2021-01-14 MED ORDER — PHENYLEPHRINE HCL-NACL 20-0.9 MG/250ML-% IV SOLN
INTRAVENOUS | Status: AC
  Filled 2021-01-14: qty 250

## 2021-01-14 MED ORDER — OXYCODONE HCL 5 MG PO TABS: 5.0000 mg | ORAL_TABLET | Freq: Once | ORAL | Status: DC | PRN

## 2021-01-14 MED ORDER — FENTANYL CITRATE PF 50 MCG/ML IJ SOSY
25.0000 ug | PREFILLED_SYRINGE | Freq: Once | INTRAMUSCULAR | Status: AC
  Administered 2021-01-14: 50 ug via INTRAVENOUS
  Filled 2021-01-14: qty 2

## 2021-01-14 MED ORDER — ALLOPURINOL 100 MG PO TABS
100.0000 mg | ORAL_TABLET | Freq: Every day | ORAL | Status: DC
  Administered 2021-01-15 – 2021-01-17 (×3): 100 mg via ORAL
  Filled 2021-01-14 (×3): qty 1

## 2021-01-14 MED ORDER — PROPOFOL 500 MG/50ML IV EMUL
INTRAVENOUS | Status: DC | PRN
  Administered 2021-01-14: 50 ug/kg/min via INTRAVENOUS

## 2021-01-14 MED ORDER — MIDAZOLAM HCL 2 MG/2ML IJ SOLN
INTRAMUSCULAR | Status: AC
  Filled 2021-01-14: qty 2

## 2021-01-14 MED ORDER — VITAMIN D 25 MCG (1000 UNIT) PO TABS
2000.0000 [IU] | ORAL_TABLET | Freq: Every day | ORAL | Status: DC
  Administered 2021-01-15 – 2021-01-17 (×3): 2000 [IU] via ORAL
  Filled 2021-01-14 (×3): qty 2

## 2021-01-14 MED ORDER — TRANEXAMIC ACID-NACL 1000-0.7 MG/100ML-% IV SOLN
1000.0000 mg | INTRAVENOUS | Status: AC
  Administered 2021-01-14: 1000 mg via INTRAVENOUS
  Filled 2021-01-14: qty 100

## 2021-01-14 MED ORDER — AMLODIPINE BESYLATE 5 MG PO TABS
5.0000 mg | ORAL_TABLET | Freq: Every day | ORAL | Status: DC
  Administered 2021-01-15 – 2021-01-17 (×3): 5 mg via ORAL
  Filled 2021-01-14 (×3): qty 1

## 2021-01-14 MED ORDER — METOCLOPRAMIDE HCL 5 MG/ML IJ SOLN: 5.0000 mg | Freq: Three times a day (TID) | INTRAMUSCULAR | Status: DC | PRN

## 2021-01-14 MED ORDER — POTASSIUM CHLORIDE ER 10 MEQ PO TBCR
10.0000 meq | EXTENDED_RELEASE_TABLET | Freq: Every day | ORAL | Status: DC
  Administered 2021-01-15 – 2021-01-17 (×3): 10 meq via ORAL
  Filled 2021-01-14 (×6): qty 1

## 2021-01-14 MED ORDER — IRBESARTAN 150 MG PO TABS
300.0000 mg | ORAL_TABLET | Freq: Every day | ORAL | Status: DC
  Administered 2021-01-15 – 2021-01-17 (×3): 300 mg via ORAL
  Filled 2021-01-14 (×3): qty 2

## 2021-01-14 MED ORDER — PROPOFOL 10 MG/ML IV BOLUS
INTRAVENOUS | Status: AC
  Filled 2021-01-14: qty 20

## 2021-01-14 MED ORDER — FERROUS SULFATE 325 (65 FE) MG PO TABS
325.0000 mg | ORAL_TABLET | Freq: Every day | ORAL | Status: DC
  Administered 2021-01-15 – 2021-01-17 (×3): 325 mg via ORAL
  Filled 2021-01-14 (×3): qty 1

## 2021-01-14 MED ORDER — PROPOFOL 10 MG/ML IV BOLUS
INTRAVENOUS | Status: DC | PRN
  Administered 2021-01-14 (×4): 20 mg via INTRAVENOUS
  Administered 2021-01-14: 30 mg via INTRAVENOUS

## 2021-01-14 MED ORDER — OXYCODONE HCL 5 MG PO TABS
5.0000 mg | ORAL_TABLET | ORAL | Status: DC | PRN
  Administered 2021-01-14 – 2021-01-17 (×4): 10 mg via ORAL
  Filled 2021-01-14 (×7): qty 2
  Filled 2021-01-14: qty 1
  Filled 2021-01-14 (×2): qty 2

## 2021-01-14 MED ORDER — KETOROLAC TROMETHAMINE 15 MG/ML IJ SOLN
7.5000 mg | Freq: Four times a day (QID) | INTRAMUSCULAR | Status: AC
  Administered 2021-01-14 – 2021-01-15 (×4): 7.5 mg via INTRAVENOUS
  Filled 2021-01-14 (×4): qty 1

## 2021-01-14 MED ORDER — CEFAZOLIN SODIUM-DEXTROSE 1-4 GM/50ML-% IV SOLN
1.0000 g | Freq: Four times a day (QID) | INTRAVENOUS | Status: AC
  Administered 2021-01-14 – 2021-01-15 (×2): 1 g via INTRAVENOUS
  Filled 2021-01-14 (×2): qty 50

## 2021-01-14 MED ORDER — PHENOL 1.4 % MT LIQD: 1.0000 | OROMUCOSAL | Status: DC | PRN

## 2021-01-14 MED ORDER — OXYCODONE HCL 5 MG/5ML PO SOLN: 5.0000 mg | Freq: Once | ORAL | Status: DC | PRN

## 2021-01-14 MED ORDER — OLMESARTAN-AMLODIPINE-HCTZ 40-5-12.5 MG PO TABS: 1.0000 | ORAL_TABLET | Freq: Every day | ORAL | Status: DC

## 2021-01-14 MED ORDER — MIDAZOLAM HCL 2 MG/2ML IJ SOLN
1.0000 mg | Freq: Once | INTRAMUSCULAR | Status: AC
  Administered 2021-01-14: 1 mg via INTRAVENOUS
  Filled 2021-01-14: qty 2

## 2021-01-14 MED ORDER — DIPHENHYDRAMINE HCL 12.5 MG/5ML PO ELIX
12.5000 mg | ORAL_SOLUTION | ORAL | Status: DC | PRN
  Administered 2021-01-14 – 2021-01-17 (×9): 25 mg via ORAL
  Filled 2021-01-14 (×9): qty 10

## 2021-01-14 MED ORDER — OXYCODONE HCL 5 MG PO TABS
10.0000 mg | ORAL_TABLET | ORAL | Status: DC | PRN
  Administered 2021-01-15: 15 mg via ORAL
  Administered 2021-01-15: 10 mg via ORAL
  Administered 2021-01-15 (×2): 15 mg via ORAL
  Administered 2021-01-16: 10 mg via ORAL
  Administered 2021-01-16 (×2): 15 mg via ORAL
  Administered 2021-01-16 – 2021-01-17 (×3): 10 mg via ORAL
  Filled 2021-01-14 (×5): qty 3

## 2021-01-14 MED ORDER — PANTOPRAZOLE SODIUM 40 MG PO TBEC
40.0000 mg | DELAYED_RELEASE_TABLET | Freq: Every day | ORAL | Status: DC
  Administered 2021-01-15 – 2021-01-17 (×3): 40 mg via ORAL
  Filled 2021-01-14 (×3): qty 1

## 2021-01-14 MED ORDER — ONDANSETRON HCL 4 MG/2ML IJ SOLN: 4.0000 mg | Freq: Four times a day (QID) | INTRAMUSCULAR | Status: DC | PRN

## 2021-01-14 MED ORDER — HYDROMORPHONE HCL 1 MG/ML IJ SOLN
INTRAMUSCULAR | Status: DC | PRN
  Administered 2021-01-14 (×2): 1 mg via INTRAVENOUS

## 2021-01-14 MED ORDER — ONDANSETRON HCL 4 MG PO TABS: 4.0000 mg | ORAL_TABLET | Freq: Four times a day (QID) | ORAL | Status: DC | PRN

## 2021-01-14 MED ORDER — BUPIVACAINE-EPINEPHRINE (PF) 0.5% -1:200000 IJ SOLN
INTRAMUSCULAR | Status: DC | PRN
  Administered 2021-01-14: 15 mL via PERINEURAL

## 2021-01-14 MED ORDER — DEXAMETHASONE SODIUM PHOSPHATE 10 MG/ML IJ SOLN
INTRAMUSCULAR | Status: DC | PRN
  Administered 2021-01-14: 4 mg via INTRAVENOUS

## 2021-01-14 MED ORDER — MIDAZOLAM HCL 2 MG/2ML IJ SOLN: 0.5000 mg | Freq: Once | INTRAMUSCULAR | Status: DC | PRN

## 2021-01-14 MED ORDER — MIDAZOLAM HCL 2 MG/2ML IJ SOLN
INTRAMUSCULAR | Status: DC | PRN
  Administered 2021-01-14: 2 mg via INTRAVENOUS

## 2021-01-14 MED ORDER — DEXAMETHASONE SODIUM PHOSPHATE 10 MG/ML IJ SOLN
INTRAMUSCULAR | Status: AC
  Filled 2021-01-14: qty 1

## 2021-01-14 MED ORDER — SODIUM CHLORIDE 0.9 % IR SOLN
Status: DC | PRN
  Administered 2021-01-14: 1000 mL

## 2021-01-14 MED ORDER — DOCUSATE SODIUM 100 MG PO CAPS
100.0000 mg | ORAL_CAPSULE | Freq: Two times a day (BID) | ORAL | Status: DC
  Administered 2021-01-14 – 2021-01-17 (×6): 100 mg via ORAL
  Filled 2021-01-14 (×6): qty 1

## 2021-01-14 MED ORDER — MENTHOL 3 MG MT LOZG: 1.0000 | LOZENGE | OROMUCOSAL | Status: DC | PRN

## 2021-01-14 MED ORDER — METOCLOPRAMIDE HCL 5 MG PO TABS: 5.0000 mg | ORAL_TABLET | Freq: Three times a day (TID) | ORAL | Status: DC | PRN

## 2021-01-14 MED ORDER — ADULT MULTIVITAMIN W/MINERALS CH
1.0000 | ORAL_TABLET | Freq: Every day | ORAL | Status: DC
  Administered 2021-01-15 – 2021-01-17 (×3): 1 via ORAL
  Filled 2021-01-14 (×3): qty 1

## 2021-01-14 MED ORDER — ACETAMINOPHEN 500 MG PO TABS
1000.0000 mg | ORAL_TABLET | Freq: Once | ORAL | Status: AC
  Administered 2021-01-14: 1000 mg via ORAL
  Filled 2021-01-14: qty 2

## 2021-01-14 MED ORDER — MEPERIDINE HCL 50 MG/ML IJ SOLN: 6.2500 mg | INTRAMUSCULAR | Status: DC | PRN

## 2021-01-14 MED ORDER — HYDROCHLOROTHIAZIDE 12.5 MG PO TABS
12.5000 mg | ORAL_TABLET | Freq: Every day | ORAL | Status: DC
  Administered 2021-01-15 – 2021-01-17 (×3): 12.5 mg via ORAL
  Filled 2021-01-14 (×3): qty 1

## 2021-01-14 MED ORDER — HYDROMORPHONE HCL 2 MG/ML IJ SOLN
INTRAMUSCULAR | Status: AC
  Filled 2021-01-14: qty 1

## 2021-01-14 MED ORDER — POVIDONE-IODINE 10 % EX SWAB
2.0000 "application " | Freq: Once | CUTANEOUS | Status: AC
  Administered 2021-01-14: 2 via TOPICAL

## 2021-01-14 MED ORDER — ACETAMINOPHEN 325 MG PO TABS
325.0000 mg | ORAL_TABLET | Freq: Four times a day (QID) | ORAL | Status: DC | PRN
  Administered 2021-01-15 – 2021-01-17 (×6): 650 mg via ORAL
  Filled 2021-01-14 (×6): qty 2

## 2021-01-14 MED ORDER — METHOCARBAMOL 500 MG IVPB - SIMPLE MED
500.0000 mg | Freq: Four times a day (QID) | INTRAVENOUS | Status: DC | PRN
  Filled 2021-01-14: qty 50

## 2021-01-14 MED ORDER — STERILE WATER FOR IRRIGATION IR SOLN
Status: DC | PRN
  Administered 2021-01-14: 2000 mL

## 2021-01-14 MED ORDER — HYDROMORPHONE HCL 1 MG/ML IJ SOLN
INTRAMUSCULAR | Status: AC
  Filled 2021-01-14: qty 1

## 2021-01-14 MED ORDER — FESOTERODINE FUMARATE ER 4 MG PO TB24
4.0000 mg | ORAL_TABLET | Freq: Every day | ORAL | Status: DC
  Administered 2021-01-15 – 2021-01-17 (×3): 4 mg via ORAL
  Filled 2021-01-14 (×3): qty 1

## 2021-01-14 SURGICAL SUPPLY — 63 items
BAG COUNTER SPONGE SURGICOUNT (BAG) ×1 IMPLANT
BAG ZIPLOCK 12X15 (MISCELLANEOUS) ×2 IMPLANT
BENZOIN TINCTURE PRP APPL 2/3 (GAUZE/BANDAGES/DRESSINGS) IMPLANT
BLADE SAG 18X100X1.27 (BLADE) ×2 IMPLANT
BLADE SURG SZ10 CARB STEEL (BLADE) ×4 IMPLANT
BNDG ELASTIC 6X5.8 VLCR STR LF (GAUZE/BANDAGES/DRESSINGS) ×3 IMPLANT
BOWL SMART MIX CTS (DISPOSABLE) ×1 IMPLANT
CEMENT BONE R 1X40 (Cement) ×2 IMPLANT
COOLER ICEMAN CLASSIC (MISCELLANEOUS) ×2 IMPLANT
COVER SURGICAL LIGHT HANDLE (MISCELLANEOUS) ×2 IMPLANT
CUFF TOURN SGL QUICK 34 (TOURNIQUET CUFF) ×2
CUFF TRNQT CYL 34X4.125X (TOURNIQUET CUFF) ×1 IMPLANT
DECANTER SPIKE VIAL GLASS SM (MISCELLANEOUS) IMPLANT
DRAPE INCISE IOBAN 66X45 STRL (DRAPES) ×2 IMPLANT
DRAPE U-SHAPE 47X51 STRL (DRAPES) ×2 IMPLANT
DRSG PAD ABDOMINAL 8X10 ST (GAUZE/BANDAGES/DRESSINGS) ×4 IMPLANT
DURAPREP 26ML APPLICATOR (WOUND CARE) ×2 IMPLANT
ELECT BLADE TIP CTD 4 INCH (ELECTRODE) ×2 IMPLANT
ELECT CAUTERY BLADE TIP 2.5 (TIP) ×2
ELECT REM PT RETURN 15FT ADLT (MISCELLANEOUS) ×2 IMPLANT
ELECTRODE CAUTERY BLDE TIP 2.5 (TIP) IMPLANT
FEMUR CMT CR STD SZ 6 RT KNEE (Joint) ×2 IMPLANT
FEMUR CMTD CR STD SZ 6 RT KNEE (Joint) IMPLANT
GAUZE SPONGE 4X4 12PLY STRL (GAUZE/BANDAGES/DRESSINGS) ×2 IMPLANT
GAUZE XEROFORM 1X8 LF (GAUZE/BANDAGES/DRESSINGS) ×1 IMPLANT
GLOVE SRG 8 PF TXTR STRL LF DI (GLOVE) ×2 IMPLANT
GLOVE SURG ENC MOIS LTX SZ7.5 (GLOVE) ×2 IMPLANT
GLOVE SURG NEOPR MICRO LF SZ8 (GLOVE) ×2 IMPLANT
GLOVE SURG UNDER POLY LF SZ8 (GLOVE) ×4
GOWN STRL REUS W/TWL XL LVL3 (GOWN DISPOSABLE) ×4 IMPLANT
HANDPIECE INTERPULSE COAX TIP (DISPOSABLE) ×2
HDLS TROCR DRIL PIN KNEE 75 (PIN) ×2
HOLDER FOLEY CATH W/STRAP (MISCELLANEOUS) IMPLANT
IMMOBILIZER KNEE 20 (SOFTGOODS) ×2
IMMOBILIZER KNEE 20 THIGH 36 (SOFTGOODS) ×1 IMPLANT
INSERT TIB ASF PS CD/6-7 RT 13 (Insert) ×1 IMPLANT
KIT TURNOVER KIT A (KITS) IMPLANT
NS IRRIG 1000ML POUR BTL (IV SOLUTION) ×2 IMPLANT
PACK TOTAL KNEE CUSTOM (KITS) ×2 IMPLANT
PAD COLD SHLDR WRAP-ON (PAD) ×2 IMPLANT
PADDING CAST COTTON 6X4 STRL (CAST SUPPLIES) ×4 IMPLANT
PADDING CAST SYN 6 (CAST SUPPLIES) ×1
PADDING CAST SYNTHETIC 6X4 NS (CAST SUPPLIES) IMPLANT
PIN DRILL HDLS TROCAR 75 4PK (PIN) IMPLANT
PROTECTOR NERVE ULNAR (MISCELLANEOUS) ×2 IMPLANT
SCREW FEMALE HEX FIX 25X2.5 (ORTHOPEDIC DISPOSABLE SUPPLIES) ×1 IMPLANT
SET HNDPC FAN SPRY TIP SCT (DISPOSABLE) ×1 IMPLANT
SET PAD KNEE POSITIONER (MISCELLANEOUS) ×2 IMPLANT
SPONGE T-LAP 18X18 ~~LOC~~+RFID (SPONGE) ×6 IMPLANT
STAPLER VISISTAT 35W (STAPLE) IMPLANT
STEM POLY PAT PLY 32M KNEE (Knees) ×1 IMPLANT
STEM TIBIA 5 DEG SZ D R KNEE (Knees) IMPLANT
STRIP CLOSURE SKIN 1/2X4 (GAUZE/BANDAGES/DRESSINGS) IMPLANT
SUT MNCRL AB 4-0 PS2 18 (SUTURE) IMPLANT
SUT VIC AB 0 CT1 27 (SUTURE) ×2
SUT VIC AB 0 CT1 27XBRD ANTBC (SUTURE) ×1 IMPLANT
SUT VIC AB 1 CT1 36 (SUTURE) ×4 IMPLANT
SUT VIC AB 2-0 CT1 27 (SUTURE) ×4
SUT VIC AB 2-0 CT1 TAPERPNT 27 (SUTURE) ×2 IMPLANT
TIBIA STEM 5 DEG SZ D R KNEE (Knees) ×2 IMPLANT
TRAY FOLEY MTR SLVR 16FR STAT (SET/KITS/TRAYS/PACK) IMPLANT
TUBE SUCTION HIGH CAP CLEAR NV (SUCTIONS) ×1 IMPLANT
WATER STERILE IRR 1000ML POUR (IV SOLUTION) ×4 IMPLANT

## 2021-01-14 NOTE — Plan of Care (Signed)

## 2021-01-14 NOTE — Anesthesia Procedure Notes (Signed)
Anesthesia Regional Block: Adductor canal block   Pre-Anesthetic Checklist: , timeout performed,  Correct Patient, Correct Site, Correct Laterality,  Correct Procedure, Correct Position, site marked,  Risks and benefits discussed,  Pre-op evaluation,  At surgeon's request and post-op pain management  Laterality: Right  Prep: Maximum Sterile Barrier Precautions used, chloraprep       Needles:  Injection technique: Single-shot  Needle Type: Echogenic Stimulator Needle     Needle Length: 9cm  Needle Gauge: 22     Additional Needles:   Procedures:,,,, ultrasound used (permanent image in chart),,    Narrative:  Start time: 01/14/2021 10:57 AM End time: 01/14/2021 11:00 AM Injection made incrementally with aspirations every 5 mL.  Performed by: Personally  Anesthesiologist: Brennan Bailey, MD  Additional Notes: Risks, benefits, and alternative discussed. Patient gave consent for procedure. Patient prepped and draped in sterile fashion. Sedation administered, patient remains easily responsive to voice. Relevant anatomy identified with ultrasound guidance. Local anesthetic given in 5cc increments with no signs or symptoms of intravascular injection. No pain or paraesthesias with injection. Patient monitored throughout procedure with signs of LAST or immediate complications. Tolerated well. Ultrasound image placed in chart.  Tawny Asal, MD

## 2021-01-14 NOTE — Evaluation (Signed)
Physical Therapy Evaluation Patient Details Name: Kayla Larsen MRN: [id number] DOB: 07/27/1970 Today's Date: 01/14/2021  History of Present Illness  Pt is a 51 year old female s/p Rt TKA on 01/14/21  Clinical Impression  Pt is s/p TKA resulting in the deficits listed below (see PT Problem List).  Pt assisted with ambulating in hallway and limited by nausea which resolved with return to recliner.  Pt anticipates d/c home tomorrow however will need to be able to perform 12-14 steps to enter home.  Pt states her children can also assist her upon d/c.  Pt will benefit from skilled PT to increase their independence and safety with mobility to allow discharge to the venue listed below.       Recommendations for follow up therapy are one component of a multi-disciplinary discharge planning process, led by the attending physician.  Recommendations may be updated based on patient status, additional functional criteria and insurance authorization.  Follow Up Recommendations Follow physician's recommendations for discharge plan and follow up therapies    Assistance Recommended at Discharge Intermittent Supervision/Assistance  Patient can return home with the following  A little help with walking and/or transfers;Help with stairs or ramp for entrance    Equipment Recommendations Rolling walker (2 wheels)  Recommendations for Other Services       Functional Status Assessment Patient has had a recent decline in their functional status and demonstrates the ability to make significant improvements in function in a reasonable and predictable amount of time.     Precautions / Restrictions Precautions Precautions: Fall;Knee Required Braces or Orthoses: Knee Immobilizer - Right Knee Immobilizer - Right: Discontinue once straight leg raise with < 10 degree lag Restrictions Weight Bearing Restrictions: No Other Position/Activity Restrictions: WBAT      Mobility  Bed Mobility Overal bed mobility:  Needs Assistance Bed Mobility: Supine to Sit     Supine to sit: Min guard;HOB elevated     General bed mobility comments: verbal cues for self assist    Transfers Overall transfer level: Needs assistance Equipment used: Rolling walker (2 wheels) Transfers: Sit to/from Stand Sit to Stand: Min guard;From elevated surface           General transfer comment: verbal cues for UE and LE positioning    Ambulation/Gait Ambulation/Gait assistance: Min guard Gait Distance (Feet): 40 Feet Assistive device: Rolling walker (2 wheels) Gait Pattern/deviations: Step-to pattern;Decreased stance time - right;Antalgic Gait velocity: decr     General Gait Details: verbal cues for sequence, RW positioning, posture, step length; distance per tolerance however pt also with nausea upon returning to room (resolved upon sitting in recliner a couple minutes, RN notified)  Stairs            Wheelchair Mobility    Modified Rankin (Stroke Patients Only)       Balance                                             Pertinent Vitals/Pain Pain Assessment: 0-10 Pain Score: 2  Pain Location: Rt knee Pain Descriptors / Indicators: Aching;Sore Pain Intervention(s): Repositioned;Premedicated before session;Monitored during session    [place] expects to be discharged to:: Private residence Living Arrangements: Children Available Help at Discharge: Family;Available 24 hours/day Type of Home: Apartment Home Access: Stairs to enter Entrance Stairs-Rails: Right;Left Entrance Stairs-Number of Steps: 12-14   Home Layout: One  level Home Equipment: Crutches      Prior Function Prior Level of Function : Independent/Modified Independent                     Hand Dominance        Extremity/Trunk Assessment        Lower Extremity Assessment Lower Extremity Assessment: RLE deficits/detail;LLE deficits/detail RLE Deficits / Details: unable to  perform SLR, maintained KI LLE Deficits / Details: reports left knee also gives her trouble (has been compensating for Rt knee)       Communication   Communication: No difficulties  Cognition Arousal/Alertness: Awake/alert Behavior During Therapy: WFL for tasks assessed/performed Overall Cognitive Status: Within Functional Limits for tasks assessed                                          General Comments      Exercises     Assessment/Plan    PT Assessment Patient needs continued PT services  PT Problem List Decreased strength;Decreased range of motion;Decreased mobility;Decreased knowledge of precautions;Decreased knowledge of use of DME;Pain       PT Treatment Interventions Stair training;Gait training;DME instruction;Therapeutic exercise;Therapeutic activities;Functional mobility training;Patient/family education    PT Goals (Current goals can be found in the Care Plan section)  Acute Rehab PT Goals PT Goal Formulation: With patient Time For Goal Achievement: 01/21/21 Potential to Achieve Goals: Good    Frequency 7X/week     Co-evaluation               AM-PAC PT "6 Clicks" Mobility  Outcome Measure Help needed turning from your back to your side while in a flat bed without using bedrails?: A Little Help needed moving from lying on your back to sitting on the side of a flat bed without using bedrails?: A Little Help needed moving to and from a bed to a chair (including a wheelchair)?: A Little Help needed standing up from a chair using your arms (e.g., wheelchair or bedside chair)?: A Little Help needed to walk in hospital room?: A Little Help needed climbing 3-5 steps with a railing? : A Little 6 Click Score: 18    End of Session Equipment Utilized During Treatment: Gait belt;Right knee immobilizer Activity Tolerance: Patient tolerated treatment well Patient left: in chair;with call bell/phone within reach;with chair alarm set Nurse  Communication: Mobility status PT Visit Diagnosis: Other abnormalities of gait and mobility (R26.89)    Time: 9476-5465 PT Time Calculation (min) (ACUTE ONLY): 20 min   Charges:   PT Evaluation $PT Eval Low Complexity: 1 Low         Kati PT, DPT Acute Rehabilitation Services Pager: [phone] Office: [place] 01/14/2021, 7:07 PM

## 2021-01-14 NOTE — Anesthesia Procedure Notes (Signed)
Spinal  Patient location during procedure: OR Start time: 01/14/2021 12:11 PM End time: 01/14/2021 12:14 PM Reason for block: surgical anesthesia Staffing Performed: anesthesiologist  Anesthesiologist: Brennan Bailey, MD Preanesthetic Checklist Completed: patient identified, IV checked, risks and benefits discussed, surgical consent, monitors and equipment checked, pre-op evaluation and timeout performed Spinal Block Patient position: sitting Prep: DuraPrep and site prepped and draped Patient monitoring: continuous pulse ox, blood pressure and heart rate Approach: midline Location: L3-4 Injection technique: single-shot Needle Needle type: Pencan  Needle gauge: 24 G Needle length: 9 cm Assessment Events: CSF return Additional Notes Risks, benefits, and alternative discussed. Patient gave consent to procedure. Prepped and draped in sitting position. Patient sedated but responsive to voice. Clear CSF obtained after one needle pass. Positive terminal aspiration. No pain or paraesthesias with injection. Patient tolerated procedure well. Vital signs stable. Tawny Asal, MD

## 2021-01-14 NOTE — Anesthesia Procedure Notes (Signed)
Date/Time: 01/14/2021 12:07 PM Performed by: Sharlette Dense, CRNA Oxygen Delivery Method: Simple face mask

## 2021-01-14 NOTE — Interval H&P Note (Signed)
History and Physical Interval Note: The patient understands that she is here today for a right knee replacement to treat her severe end-stage arthritis of her right knee.  There has been no acute or interval change in her medical status.  Please see recent H&P.  The risks and benefits of surgery been explained in detail and informed consent is obtained.  The right operative knee has been marked.  01/14/2021 10:36 AM  Kayla Larsen  has presented today for surgery, with the diagnosis of Osteoarthritis, Degenerative Joint Diease Right Knee.  The various methods of treatment have been discussed with the patient and family. After consideration of risks, benefits and other options for treatment, the patient has consented to  Procedure(s) with comments: Right TOTAL KNEE ARTHROPLASTY (Right) - RNFA as a surgical intervention.  The patient's history has been reviewed, patient examined, no change in status, stable for surgery.  I have reviewed the patient's chart and labs.  Questions were answered to the patient's satisfaction.     [name]

## 2021-01-14 NOTE — Transfer of Care (Signed)
Immediate Anesthesia Transfer of Care Note  Patient: Kayla Larsen  Procedure(s) Performed: Right TOTAL KNEE ARTHROPLASTY (Right: Knee)  Patient Location: PACU  Anesthesia Type:Spinal  Level of Consciousness: awake, alert  and oriented  Airway & Oxygen Therapy: Patient Spontanous Breathing and Patient connected to face mask oxygen  Post-op Assessment: Report given to RN and Post -op Vital signs reviewed and stable  Post vital signs: Reviewed and stable  Last Vitals:  Vitals Value Taken Time  BP    Temp    Pulse 61 01/14/21 1428  Resp 12 01/14/21 1428  SpO2 100 % 01/14/21 1428  Vitals shown include unvalidated device data.  Last Pain:  Vitals:   01/14/21 0931  TempSrc:   PainSc: 0-No pain      Patients Stated Pain Goal: 3 (16/74/25 5258)  Complications: No notable events documented.

## 2021-01-14 NOTE — Op Note (Signed)
NAME: Kayla Larsen, Kayla Larsen MEDICAL RECORD NO: [id number] ACCOUNT NO: [id number] DATE OF BIRTH: December 15, 1970 FACILITY: [facility] LOCATION: [id number] PHYSICIAN: Lind Guest. Ninfa Linden, MD  Operative Report   DATE OF PROCEDURE: 01/14/2021   PREOPERATIVE DIAGNOSIS:  Primary osteoarthritis and degenerative joint disease, right knee.  POSTOPERATIVE DIAGNOSIS:  Primary osteoarthritis and degenerative joint disease, right knee.  PROCEDURE:  Cemented right total knee arthroplasty.  IMPLANTS:  Biomet/Zimmer Persona knee system with size 6 right femur, size D right tibial tray, 13 mm thickness medial congruent fixed bearing polyethylene insert, size 32 patellar button.  SURGEON:  Jean Rosenthal, MD  ASSISTANT:  Hustisford Bing, RNFA  ANESTHESIA:  1.  Right lower extremity adductor canal block. 2.  Spinal.  ESTIMATED BLOOD LOSS:  Less than 100 mL.  TOURNIQUET TIME:  Less than an hour and a half.  ANTIBIOTICS:  2 g IV Ancef.  COMPLICATIONS:  None.  INDICATIONS:  The patient is a very pleasant 51 year old female well known to me.  Unfortunately, she has debilitating arthritis with varus malalignment of both her knees.  Her right hurts worse than the left.  She has tried and failed conservative  treatment for years.  X-ray shows severe arthritis in both her knees.  At this point, she wished to proceed with total knee arthroplasty and I agree with this as well.  She has exhausted all conservative treatment measures.  Her pain is daily and is  detrimentally affecting her mobility, her quality of life and her activities of daily living.  We did talk about the risk of acute blood loss anemia, nerve or vessel injury, fracture, infection, DVT, implant failure and skin and soft tissue issues.  We  talked about our goals being decreased pain, improve mobility and overall improve quality of life.  DESCRIPTION OF PROCEDURE:  After informed consent was obtained, appropriate right knee was marked.  She was brought  to the operating room after an adductor canal block was obtained in the right lower extremity in the holding room. In the operating room  she was sat up on the operating table and a spinal anesthesia was obtained.  She was laid in supine position on the operating table.  Foley catheter was placed and a nonsterile tourniquet was placed around her upper right thigh.  Her right thigh, knee,  leg, ankle and foot were prepped and draped with DuraPrep and sterile drapes.  A timeout was called and she was identified as correct patient, correct right knee.  We then used Esmarch to wrap that leg and tourniquet was inflated to 300 mm of pressure.   I then made a direct midline incision over the patella and carried this proximally and distally.  I dissected down the knee joint, carried out a medial parapatellar arthrotomy finding very large joint effusion and significant osteophytes in all 3  compartments of the knee and severe cartilage wear throughout the knee. With the knee in a flexed position, we removed osteophytes from all three compartments as well as remnants of the ACL, medial and lateral meniscus.  Using extramedullary cutting  guide for making our proximal tibia cut, we set this for a 5-degree slope correcting for varus and valgus and taking 9 mm off the high side.  We made this cut without difficulty.  We then went to the femur and we placed our femoral sizing guide based off  of a right knee at 5 degrees externally rotated for a 10 mm distal femoral cut, we made this cut without difficulty, and brought  the knee back down to full extension and with a 10 mm extension block, she slightly hyperextended.  We then went back to the  femur for our femoral sizing guide.  We placed this based off the epicondylar axis and we chose a size 6 femur.  We put a 4-in-1 cutting block for a size 6 femur, made our anterior and posterior cuts, followed by our chamfer cuts.  We then went back to  the tibia and chose a size D  right tibial tray for coverage over the tibia and set the rotation of the femur and tibial tubercle.  We did our keel punch off of this.  With a size D right tibia, we trialed a right 6 femur.  We went up to a 13 mm medial  congruent fixed bearing polyethylene insert.  We were pleased with range of motion and stability with out insert.  We then made our patellar cut and drilled three holes for a size 32 patellar button.  With all trials in position and the knee, I put her  through several cycles of motion.  I was pleased with range of motion and stability.  We then removed all trial instrumentation from the knee and irrigated the knee with normal saline solution using pulsatile lavage.  I dried the knee real well and with  the knee in a flexed position, we cemented our Biomet Zimmer Persona tibial component for a right knee size D, followed by our size 6 right femur, which we cemented, we removed excess cement from the knee and then placed our medial congruent fixed  bearing polyethylene insert, size 13 mm thickness.  We then cemented our patellar button.  I then held the knee in a fully extended position compressing the knee while the cement hardened.  Once it hardened, I put it through several cycles of motion.  We  were pleased with range of motion and stability.  We removed any excess cement debris from the knee we could and we let the tourniquet down.  Hemostasis was obtained with electrocautery.  We then closed the arthrotomy with interrupted #1 Vicryl suture  followed by 0 Vicryl for the deep tissue and 2-0 Vicryl to close the subcutaneous tissue.  The skin was closed with staples.  A well-padded sterile dressing was applied.  She was taken to recovery room in stable condition.  All final counts being  correct.  No complications noted.     SUJ D: 01/14/2021 2:03:05 pm T: 01/14/2021 10:47:00 pm  JOB: [id number]/ [id number]

## 2021-01-14 NOTE — Plan of Care (Signed)
°  Problem: Education: Goal: Knowledge of General Education information will improve Description: Including pain rating scale, medication(s)/side effects and non-pharmacologic comfort measures Outcome: Progressing   Problem: Coping: Goal: Level of anxiety will decrease Outcome: Progressing   Problem: Pain Managment: Goal: General experience of comfort will improve Outcome: Progressing   Problem: Safety: Goal: Ability to remain free from injury will improve Outcome: Progressing   Problem: Clinical Measurements: Goal: Postoperative complications will be avoided or minimized Outcome: Progressing

## 2021-01-14 NOTE — Brief Op Note (Signed)
01/14/2021  2:04 PM  PATIENT:  Rise Mu  51 y.o. female  PRE-OPERATIVE DIAGNOSIS:  Osteoarthritis, Degenerative Joint Diease Right Knee  POST-OPERATIVE DIAGNOSIS:  Osteoarthritis, Degenerative Joint Diease Right Knee  PROCEDURE:  Procedure(s) with comments: Right TOTAL KNEE ARTHROPLASTY (Right) - RNFA  SURGEON:  Surgeon(s) and Role:    [name], MD - Primary  ASSISTANTS: Vicki Mallet, RNFA   ANESTHESIA:   regional and spinal  EBL:  50 mL   COUNTS:  YES  TOURNIQUET:   Total Tourniquet Time Documented: Thigh (Right) - 68 minutes Total: Thigh (Right) - 68 minutes   DICTATION: .Other Dictation: Dictation Number [id number]  PLAN OF CARE: Admit for overnight observation  PATIENT DISPOSITION:  PACU - hemodynamically stable.   Delay start of Pharmacological VTE agent (>24hrs) due to surgical blood loss or risk of bleeding: no

## 2021-01-14 NOTE — Anesthesia Preprocedure Evaluation (Addendum)
Anesthesia Evaluation  Patient identified by MRN, date of birth, ID band  Reviewed: Allergy & Precautions, NPO status , Patient's Chart, lab work & pertinent test results  History of Anesthesia Complications Negative for: history of anesthetic complications  Airway Mallampati: II  TM Distance: >3 FB Neck ROM: Full    Dental no notable dental hx.    Pulmonary sleep apnea , former smoker,  01/13/2020 SARS coronavirus NEG   Pulmonary exam normal        Cardiovascular hypertension, Pt. on medications Normal cardiovascular exam     Neuro/Psych    GI/Hepatic   Endo/Other  obese  Renal/GU      Musculoskeletal  (+) Arthritis , Osteoarthritis,    Abdominal   Peds  Hematology   Anesthesia Other Findings   Reproductive/Obstetrics                            Anesthesia Physical Anesthesia Plan  ASA: 2  Anesthesia Plan: Spinal   Post-op Pain Management: Regional block and Tylenol PO (pre-op)   Induction:   PONV Risk Score and Plan: 2 and Ondansetron, Treatment may vary due to age or medical condition, Midazolam, Dexamethasone and Propofol infusion  Airway Management Planned: Natural Airway and Simple Face Mask  Additional Equipment: None  Intra-op Plan:   Post-operative Plan:   Informed Consent: I have reviewed the patients History and Physical, chart, labs and discussed the procedure including the risks, benefits and alternatives for the proposed anesthesia with the patient or authorized representative who has indicated his/her understanding and acceptance.       Plan Discussed with: CRNA  Anesthesia Plan Comments:        Anesthesia Quick Evaluation

## 2021-01-14 NOTE — Progress Notes (Signed)
AssistedDr. Birdie Sons with right, ultrasound guided, adductor canal block. Side rails up, monitors on throughout procedure. See vital signs in flow sheet. Tolerated Procedure well.

## 2021-01-14 NOTE — Plan of Care (Signed)
  Problem: Clinical Measurements: Goal: Ability to maintain clinical measurements within normal limits will improve Outcome: Progressing   Problem: Activity: Goal: Risk for activity intolerance will decrease Outcome: Progressing   Problem: Pain Managment: Goal: General experience of comfort will improve Outcome: Progressing   

## 2021-01-15 DIAGNOSIS — M1711 Unilateral primary osteoarthritis, right knee: Secondary | ICD-10-CM | POA: Diagnosis not present

## 2021-01-15 LAB — CBC
HCT: 30.6 % — ABNORMAL LOW (ref 36.0–46.0)
Hemoglobin: 9.6 g/dL — ABNORMAL LOW (ref 12.0–15.0)
MCH: 23.4 pg — ABNORMAL LOW (ref 26.0–34.0)
MCHC: 31.4 g/dL (ref 30.0–36.0)
MCV: 74.6 fL — ABNORMAL LOW (ref 80.0–100.0)
Platelets: 310 10*3/uL (ref 150–400)
RBC: 4.1 MIL/uL (ref 3.87–5.11)
RDW: 15.2 % (ref 11.5–15.5)
WBC: 11.3 10*3/uL — ABNORMAL HIGH (ref 4.0–10.5)
nRBC: 0 % (ref 0.0–0.2)

## 2021-01-15 LAB — BASIC METABOLIC PANEL
Anion gap: 7 (ref 5–15)
BUN: 16 mg/dL (ref 6–20)
CO2: 25 mmol/L (ref 22–32)
Calcium: 9.1 mg/dL (ref 8.9–10.3)
Chloride: 105 mmol/L (ref 98–111)
Creatinine, Ser: 0.61 mg/dL (ref 0.44–1.00)
GFR, Estimated: >60 mL/min (ref >60)
Glucose, Bld: 147 mg/dL — ABNORMAL HIGH (ref 70–99)
Potassium: 3.8 mmol/L (ref 3.5–5.1)
Sodium: 137 mmol/L (ref 135–145)

## 2021-01-15 MED ORDER — TIZANIDINE HCL 4 MG PO TABS: 4.0000 mg | ORAL_TABLET | Freq: Three times a day (TID) | ORAL | 0 refills | Status: DC | PRN

## 2021-01-15 MED ORDER — OXYCODONE HCL 5 MG PO TABS: 5.0000 mg | ORAL_TABLET | ORAL | 0 refills | Status: DC | PRN

## 2021-01-15 MED ORDER — ASPIRIN 81 MG PO CHEW: 81.0000 mg | CHEWABLE_TABLET | Freq: Two times a day (BID) | ORAL | 0 refills | Status: DC

## 2021-01-15 NOTE — Progress Notes (Signed)
Subjective: 1 Day Post-Op Procedure(s) (LRB): Right TOTAL KNEE ARTHROPLASTY (Right) Patient reports pain as moderate.  She is working with therapy currently.  She is requesting to stay until tomorrow because they do need to work on her with stairs since she has some declined to get into her house.  Her hemoglobin is 9.6 but it was only 10.0 preop because she does have chronic anemia.  Her vital signs are stable.  She feels good overall.  Objective: Vital signs in last 24 hours: Temp:  [97.8 F (36.6 C)-98.5 F (36.9 C)] 98.5 F (36.9 C) (01/07 6168) Pulse Rate:  [57-77] 68 (01/07 0638) Resp:  [12-23] 19 (01/07 0308) BP: (104-142)/(63-83) 131/63 (01/07 0638) SpO2:  [96 %-100 %] 99 % (01/07 3729) Weight:  [99.8 kg] 99.8 kg (01/06 1610)  Intake/Output from previous day: 01/06 0701 - 01/07 0700 In: 2872.9 [P.O.:480; I.V.:2094.5; IV Piggyback:298.4] Out: 1050 [Urine:1000; Blood:50] Intake/Output this shift: Total I/O In: 396.5 [I.V.:396.5] Out: 50 [Urine:50]  Recent Labs    01/15/21 0304  HGB 9.6*   Recent Labs    01/15/21 0304  WBC 11.3*  RBC 4.10  HCT 30.6*  PLT 310   Recent Labs    01/15/21 0304  NA 137  K 3.8  CL 105  CO2 25  BUN 16  CREATININE 0.61  GLUCOSE 147*  CALCIUM 9.1   No results for input(s): LABPT, INR in the last 72 hours.  Sensation intact distally Intact pulses distally Dorsiflexion/Plantar flexion intact Incision: dressing C/D/I No cellulitis present Compartment soft   Assessment/Plan: 1 Day Post-Op Procedure(s) (LRB): Right TOTAL KNEE ARTHROPLASTY (Right) Up with therapy Plan for discharge tomorrow      [name] 01/15/2021, 10:06 AM

## 2021-01-15 NOTE — Plan of Care (Signed)

## 2021-01-15 NOTE — Progress Notes (Signed)
Physical Therapy Treatment Patient Details Name: Kayla Larsen MRN: [id number] DOB: 02/13/1970 Today's Date: 01/15/2021   History of Present Illness Pt is a 51 year old female s/p Rt TKA on 01/14/21    PT Comments    Pt assisted to bathroom, ambulated in hallway and initiated stair training.  Pt anticipates d/c home tomorrow.    Recommendations for follow up therapy are one component of a multi-disciplinary discharge planning process, led by the attending physician.  Recommendations may be updated based on patient status, additional functional criteria and insurance authorization.  Follow Up Recommendations  Follow physician's recommendations for discharge plan and follow up therapies     Assistance Recommended at Discharge Intermittent Supervision/Assistance  Patient can return home with the following A little help with walking and/or transfers;Help with stairs or ramp for entrance   Equipment Recommendations  Rolling walker (2 wheels)    Recommendations for Other Services       Precautions / Restrictions Precautions Precautions: Fall;Knee Required Braces or Orthoses: Knee Immobilizer - Right Knee Immobilizer - Right: Discontinue once straight leg raise with < 10 degree lag Restrictions Other Position/Activity Restrictions: WBAT     Mobility  Bed Mobility Overal bed mobility: Needs Assistance Bed Mobility: Supine to Sit;Sit to Supine     Supine to sit: Min guard;HOB elevated Sit to supine: Min guard;HOB elevated   General bed mobility comments: increased time and effort    Transfers Overall transfer level: Needs assistance Equipment used: Rolling walker (2 wheels) Transfers: Sit to/from Stand Sit to Stand: Min guard           General transfer comment: verbal cues for UE and LE positioning    Ambulation/Gait Ambulation/Gait assistance: Min guard Gait Distance (Feet): 200 Feet Assistive device: Rolling walker (2 wheels) Gait Pattern/deviations: Step-to  pattern;Decreased stance time - right;Antalgic Gait velocity: decr     General Gait Details: verbal cues for sequence, RW positioning, posture, step length; a few rest breaks required due to UE fatigue   Stairs Stairs: Yes Stairs assistance: Min guard Stair Management: Step to pattern;Forwards;One rail Right;With crutches Number of Stairs: 4 General stair comments: verbal cues for sequence and safety with right rail and left crutch   Wheelchair Mobility    Modified Rankin (Stroke Patients Only)       Balance                                            Cognition Arousal/Alertness: Awake/alert Behavior During Therapy: WFL for tasks assessed/performed Overall Cognitive Status: Within Functional Limits for tasks assessed                                          Exercises     General Comments        Pertinent Vitals/Pain Pain Assessment: 0-10 Pain Score: 5  Pain Location: Rt knee Pain Descriptors / Indicators: Aching;Sore Pain Intervention(s): Repositioned;Monitored during session;Ice applied    Home Living                          Prior Function            PT Goals (current goals can now be found in the care plan section) Progress towards PT goals: Progressing  toward goals    Frequency    7X/week      PT Plan Current plan remains appropriate    Co-evaluation              AM-PAC PT "6 Clicks" Mobility   Outcome Measure  Help needed turning from your back to your side while in a flat bed without using bedrails?: A Little Help needed moving from lying on your back to sitting on the side of a flat bed without using bedrails?: A Little Help needed moving to and from a bed to a chair (including a wheelchair)?: A Little Help needed standing up from a chair using your arms (e.g., wheelchair or bedside chair)?: A Little Help needed to walk in hospital room?: A Little Help needed climbing 3-5 steps with a  railing? : A Little 6 Click Score: 18    End of Session Equipment Utilized During Treatment: Gait belt;Right knee immobilizer Activity Tolerance: Patient tolerated treatment well Patient left: with call bell/phone within reach;in bed;with bed alarm set Nurse Communication: Mobility status PT Visit Diagnosis: Other abnormalities of gait and mobility (R26.89)     Time: 0923-3007 PT Time Calculation (min) (ACUTE ONLY): 24 min  Charges:  $Gait Training: 23-37 mins                    Jannette Spanner PT, DPT Acute Rehabilitation Services Pager: [phone] Office: [place] 01/15/2021, 2:28 PM

## 2021-01-15 NOTE — Discharge Instructions (Signed)

## 2021-01-15 NOTE — Progress Notes (Signed)
Physical Therapy Treatment Patient Details Name: Kayla Larsen MRN: [id number] DOB: 05-31-1970 Today's Date: 01/15/2021   History of Present Illness Pt is a 51 year old female s/p Rt TKA on 01/14/21    PT Comments    Pt assisted to bathroom, ambulated in hallway and performed LE exercises.  Pt anticipates d/c home tomorrow (has flight of stairs to enter home).   Recommendations for follow up therapy are one component of a multi-disciplinary discharge planning process, led by the attending physician.  Recommendations may be updated based on patient status, additional functional criteria and insurance authorization.  Follow Up Recommendations  Follow physician's recommendations for discharge plan and follow up therapies     Assistance Recommended at Discharge Intermittent Supervision/Assistance  Patient can return home with the following A little help with walking and/or transfers;Help with stairs or ramp for entrance   Equipment Recommendations  Rolling walker (2 wheels)    Recommendations for Other Services       Precautions / Restrictions Precautions Precautions: Fall;Knee Required Braces or Orthoses: Knee Immobilizer - Right Knee Immobilizer - Right: Discontinue once straight leg raise with < 10 degree lag Restrictions Other Position/Activity Restrictions: WBAT     Mobility  Bed Mobility               General bed mobility comments: pt in recliner    Transfers Overall transfer level: Needs assistance Equipment used: Rolling walker (2 wheels) Transfers: Sit to/from Stand Sit to Stand: Min guard           General transfer comment: verbal cues for UE and LE positioning    Ambulation/Gait Ambulation/Gait assistance: Min guard Gait Distance (Feet): 140 Feet Assistive device: Rolling walker (2 wheels) Gait Pattern/deviations: Step-to pattern;Decreased stance time - right;Antalgic       General Gait Details: verbal cues for sequence, RW positioning,  posture, step length; a few rest breaks required due to UE fatigue   Stairs             Wheelchair Mobility    Modified Rankin (Stroke Patients Only)       Balance                                            Cognition Arousal/Alertness: Awake/alert Behavior During Therapy: WFL for tasks assessed/performed Overall Cognitive Status: Within Functional Limits for tasks assessed                                          Exercises Total Joint Exercises Ankle Circles/Pumps: AROM;Both;10 reps Quad Sets: AROM;Both;10 reps Heel Slides: AAROM;Right;10 reps Hip ABduction/ADduction: AAROM;Right;10 reps Straight Leg Raises: AAROM;Right;10 reps    General Comments        Pertinent Vitals/Pain Pain Assessment: 0-10 Pain Score: 5  Pain Location: Rt knee Pain Descriptors / Indicators: Aching;Sore Pain Intervention(s): Repositioned;Monitored during session    Home Living                          Prior Function            PT Goals (current goals can now be found in the care plan section) Progress towards PT goals: Progressing toward goals    Frequency    7X/week  PT Plan Current plan remains appropriate    Co-evaluation              AM-PAC PT "6 Clicks" Mobility   Outcome Measure  Help needed turning from your back to your side while in a flat bed without using bedrails?: A Little Help needed moving from lying on your back to sitting on the side of a flat bed without using bedrails?: A Little Help needed moving to and from a bed to a chair (including a wheelchair)?: A Little Help needed standing up from a chair using your arms (e.g., wheelchair or bedside chair)?: A Little Help needed to walk in hospital room?: A Little Help needed climbing 3-5 steps with a railing? : A Little 6 Click Score: 18    End of Session Equipment Utilized During Treatment: Gait belt;Right knee immobilizer Activity Tolerance:  Patient tolerated treatment well Patient left: with call bell/phone within reach;in bed;with bed alarm set Nurse Communication: Mobility status PT Visit Diagnosis: Other abnormalities of gait and mobility (R26.89)     Time: 4098-1191 PT Time Calculation (min) (ACUTE ONLY): 30 min  Charges:  $Gait Training: 8-22 mins $Therapeutic Exercise: 8-22 mins                     Jannette Spanner PT, DPT Acute Rehabilitation Services Pager: [phone] Office: [place] 01/15/2021, 2:24 PM

## 2021-01-15 NOTE — Plan of Care (Signed)
°  Problem: Education: Goal: Knowledge of General Education information will improve Description: Including pain rating scale, medication(s)/side effects and non-pharmacologic comfort measures Outcome: Progressing   Problem: Clinical Measurements: Goal: Ability to maintain clinical measurements within normal limits will improve Outcome: Progressing   Problem: Coping: Goal: Level of anxiety will decrease Outcome: Progressing   Problem: Pain Managment: Goal: General experience of comfort will improve Outcome: Progressing   Problem: Activity: Goal: Ability to avoid complications of mobility impairment will improve Outcome: Progressing   Problem: Clinical Measurements: Goal: Postoperative complications will be avoided or minimized Outcome: Progressing

## 2021-01-16 DIAGNOSIS — M1711 Unilateral primary osteoarthritis, right knee: Secondary | ICD-10-CM | POA: Diagnosis not present

## 2021-01-16 DIAGNOSIS — Z96651 Presence of right artificial knee joint: Secondary | ICD-10-CM

## 2021-01-16 NOTE — Progress Notes (Signed)
Physical Therapy Treatment Patient Details Name: Kayla Larsen MRN: [id number] DOB: 09/07/1970 Today's Date: 01/16/2021   History of Present Illness Pt is a 51 year old female s/p Rt TKA on 01/14/21    PT Comments    Pt progressing well. Session focused on TKA, HEP and ROM. Amb short distance in room. Will review stairs again tomorrow and pt should (again) be ready for d/c.  Recommendations for follow up therapy are one component of a multi-disciplinary discharge planning process, led by the attending physician.  Recommendations may be updated based on patient status, additional functional criteria and insurance authorization.  Follow Up Recommendations  Follow physician's recommendations for discharge plan and follow up therapies     Assistance Recommended at Discharge Intermittent Supervision/Assistance  Patient can return home with the following A little help with walking and/or transfers;Help with stairs or ramp for entrance;Assist for transportation   Equipment Recommendations  Rolling walker (2 wheels)    Recommendations for Other Services       Precautions / Restrictions Precautions Precautions: Fall;Knee Required Braces or Orthoses: Knee Immobilizer - Right Knee Immobilizer - Right: Discontinue once straight leg raise with < 10 degree lag Restrictions Other Position/Activity Restrictions: WBAT     Mobility  Bed Mobility               General bed mobility comments: in recliner    Transfers Overall transfer level: Needs assistance Equipment used: Rolling walker (2 wheels) Transfers: Sit to/from Stand Sit to Stand: Min guard;Supervision           General transfer comment: verbal cues for UE and LE positioning    Ambulation/Gait Ambulation/Gait assistance: Supervision Gait Distance (Feet): 20 Feet (x2) Assistive device: Rolling walker (2 wheels) Gait Pattern/deviations: Step-to pattern;Decreased stance time - right Gait velocity: decr     General  Gait Details: verbal cues for sequence, RW positioning, posture, step length; a few rest breaks required due to UE fatigue   Stairs             Wheelchair Mobility    Modified Rankin (Stroke Patients Only)       Balance                                            Cognition Arousal/Alertness: Awake/alert Behavior During Therapy: WFL for tasks assessed/performed Overall Cognitive Status: Within Functional Limits for tasks assessed                                          Exercises Total Joint Exercises Ankle Circles/Pumps: AROM;Both;10 reps Quad Sets: AROM;Both;10 reps Heel Slides: AAROM;Right;10 reps Hip ABduction/ADduction: AAROM;Right;10 reps Straight Leg Raises: AAROM;Right;10 reps    General Comments        Pertinent Vitals/Pain Pain Location: Rt knee Pain Descriptors / Indicators: Aching;Sore    Home Living                          Prior Function            PT Goals (current goals can now be found in the care plan section) Acute Rehab PT Goals PT Goal Formulation: With patient Time For Goal Achievement: 01/21/21 Potential to Achieve Goals: Good Progress towards PT goals: Progressing toward  goals    Frequency    7X/week      PT Plan Current plan remains appropriate    Co-evaluation              AM-PAC PT "6 Clicks" Mobility   Outcome Measure  Help needed turning from your back to your side while in a flat bed without using bedrails?: A Little Help needed moving from lying on your back to sitting on the side of a flat bed without using bedrails?: A Little Help needed moving to and from a bed to a chair (including a wheelchair)?: A Little Help needed standing up from a chair using your arms (e.g., wheelchair or bedside chair)?: A Little Help needed to walk in hospital room?: A Little Help needed climbing 3-5 steps with a railing? : A Little 6 Click Score: 18    End of Session  Equipment Utilized During Treatment: Gait belt;Right knee immobilizer Activity Tolerance: Patient tolerated treatment well Patient left: in chair;with call bell/phone within reach Nurse Communication: Mobility status PT Visit Diagnosis: Other abnormalities of gait and mobility (R26.89)     Time: 1510-1530 PT Time Calculation (min) (ACUTE ONLY): 20 min  Charges:  $Therapeutic Exercise: 8-22 mins                     Baxter Flattery, PT  Acute Rehab Dept ([place]) [phone] Pager [phone]  01/16/2021    [facility] 01/16/2021, 3:38 PM

## 2021-01-16 NOTE — Progress Notes (Signed)
Physical Therapy Treatment Patient Details Name: Kayla Larsen MRN: [id number] DOB: January 31, 1970 Today's Date: 01/16/2021   History of Present Illness Pt is a 51 year old female s/p Rt TKA on 01/14/21    PT Comments    Pt continues to progress steadily. States she wants to stay an extra day, likely could d/c today from PT standpoint with intermittent support at home. She does have a flight of stairs, should be able to manage from an endurance standpoint. Defer d/c to MD    Recommendations for follow up therapy are one component of a multi-disciplinary discharge planning process, led by the attending physician.  Recommendations may be updated based on patient status, additional functional criteria and insurance authorization.  Follow Up Recommendations  Follow physician's recommendations for discharge plan and follow up therapies     Assistance Recommended at Discharge Intermittent Supervision/Assistance  Patient can return home with the following A little help with walking and/or transfers;Help with stairs or ramp for entrance;Assist for transportation   Equipment Recommendations  Rolling walker (2 wheels)    Recommendations for Other Services       Precautions / Restrictions Precautions Precautions: Fall;Knee Required Braces or Orthoses: Knee Immobilizer - Right Knee Immobilizer - Right: Discontinue once straight leg raise with < 10 degree lag Restrictions Weight Bearing Restrictions: No Other Position/Activity Restrictions: WBAT     Mobility  Bed Mobility               General bed mobility comments: in recliner    Transfers Overall transfer level: Needs assistance Equipment used: Rolling walker (2 wheels) Transfers: Sit to/from Stand Sit to Stand: Min guard;Supervision           General transfer comment: verbal cues for UE and LE positioning    Ambulation/Gait Ambulation/Gait assistance: Supervision Gait Distance (Feet): 180 Feet Assistive device:  Rolling walker (2 wheels) Gait Pattern/deviations: Step-to pattern;Decreased stance time - right Gait velocity: decr     General Gait Details: verbal cues for sequence, RW positioning, posture, step length; a few rest breaks required due to UE fatigue   Stairs Stairs: Yes Stairs assistance: Min guard Stair Management: One rail Left;Forwards;With crutches;Step to pattern Number of Stairs: 7 General stair comments: verbal cues for sequence and safety with left  rail and right  crutch   Wheelchair Mobility    Modified Rankin (Stroke Patients Only)       Balance                                            Cognition Arousal/Alertness: Awake/alert Behavior During Therapy: WFL for tasks assessed/performed Overall Cognitive Status: Within Functional Limits for tasks assessed                                          Exercises      General Comments        Pertinent Vitals/Pain Pain Assessment: 0-10 Pain Score: 4  Pain Location: Rt knee Pain Descriptors / Indicators: Aching;Sore Pain Intervention(s): Limited activity within patient's tolerance;Monitored during session;Premedicated before session;Ice applied    Home Living                          Prior Function  PT Goals (current goals can now be found in the care plan section) Acute Rehab PT Goals PT Goal Formulation: With patient Time For Goal Achievement: 01/21/21 Potential to Achieve Goals: Good Progress towards PT goals: Progressing toward goals    Frequency    7X/week      PT Plan Current plan remains appropriate    Co-evaluation              AM-PAC PT "6 Clicks" Mobility   Outcome Measure  Help needed turning from your back to your side while in a flat bed without using bedrails?: A Little Help needed moving from lying on your back to sitting on the side of a flat bed without using bedrails?: A Little Help needed moving to and from a  bed to a chair (including a wheelchair)?: A Little Help needed standing up from a chair using your arms (e.g., wheelchair or bedside chair)?: A Little Help needed to walk in hospital room?: A Little Help needed climbing 3-5 steps with a railing? : A Little 6 Click Score: 18    End of Session Equipment Utilized During Treatment: Gait belt;Right knee immobilizer Activity Tolerance: Patient tolerated treatment well Patient left: in chair;with call bell/phone within reach Nurse Communication: Mobility status PT Visit Diagnosis: Other abnormalities of gait and mobility (R26.89)     Time: 7035-0093 PT Time Calculation (min) (ACUTE ONLY): 31 min  Charges:  $Gait Training: 23-37 mins                     Baxter Flattery, PT  Acute Rehab Dept ([place]) [phone] Pager [phone]  01/16/2021    [facility] 01/16/2021, 10:26 AM

## 2021-01-16 NOTE — Plan of Care (Signed)

## 2021-01-16 NOTE — Plan of Care (Signed)

## 2021-01-16 NOTE — Progress Notes (Signed)
°  Subjective: Kayla Larsen is a 51 y.o. female s/p right TKA.  They are POD 2.  Pt's pain is controlled but moderate.    Pt has ambulated with some difficulty.   She has about 10-15 stairs to get into her house and does not feel comfortable navigating this many stairs yet.  Denies any lightheadedness or dizziness with ambulation.  No chest pain or shortness of breath or calf pain.  No abdominal pain.  Does complain of some itching in her arms.  Objective: Vital signs in last 24 hours: Temp:  [98.1 F (36.7 C)-99.8 F (37.7 C)] 99.8 F (37.7 C) (01/08 0940) Pulse Rate:  [73-91] 75 (01/08 0940) Resp:  [15-18] 17 (01/08 0940) BP: (116-156)/(54-92) 156/70 (01/08 0940) SpO2:  [94 %-100 %] 95 % (01/08 0940)  Intake/Output from previous day: 01/07 0701 - 01/08 0700 In: 2057.2 [P.O.:960; I.V.:1097.2] Out: 200 [Urine:200] Intake/Output this shift: No intake/output data recorded.  Exam:  No gross blood or drainage overlying the dressing 2+ DP pulse Sensation intact distally in the right foot Able to dorsiflex and plantarflex the right foot Minimal calf tenderness.  Negative Homans' sign.  Not able to perform straight leg raise yet.   Labs: Recent Labs    01/15/21 0304  HGB 9.6*   Recent Labs    01/15/21 0304  WBC 11.3*  RBC 4.10  HCT 30.6*  PLT 310   Recent Labs    01/15/21 0304  NA 137  K 3.8  CL 105  CO2 25  BUN 16  CREATININE 0.61  GLUCOSE 147*  CALCIUM 9.1   No results for input(s): LABPT, INR in the last 72 hours.  Assessment/Plan: Pt is POD 2 s/p right TKA.    -Plan to discharge to home in coming days (likely tomorrow).  She wants another night to stay so that she can do more therapy to prepare for her discharge.  -WBAT with a walker  -Benadryl for itching       Donella Stade 01/16/2021, 11:51 AM

## 2021-01-17 DIAGNOSIS — M1711 Unilateral primary osteoarthritis, right knee: Secondary | ICD-10-CM | POA: Diagnosis not present

## 2021-01-17 NOTE — Progress Notes (Signed)
Physical Therapy Treatment Patient Details Name: Kayla Larsen MRN: [id number] DOB: 1970/01/28 Today's Date: 01/17/2021   History of Present Illness Pt is a 51 year old female s/p Rt TKA on 01/14/21    PT Comments    Pt is progressing well, meeting goals. Ready for d/c from PT standpoint and assist as noted below   Recommendations for follow up therapy are one component of a multi-disciplinary discharge planning process, led by the attending physician.  Recommendations may be updated based on patient status, additional functional criteria and insurance authorization.  Follow Up Recommendations  Follow physician's recommendations for discharge plan and follow up therapies     Assistance Recommended at Discharge Intermittent Supervision/Assistance  Patient can return home with the following A little help with walking and/or transfers;Help with stairs or ramp for entrance;Assist for transportation   Equipment Recommendations  Rolling walker (2 wheels)    Recommendations for Other Services       Precautions / Restrictions Precautions Precautions: Fall;Knee Precaution Comments: improved quad activation, amb without use of KI today Required Braces or Orthoses: Knee Immobilizer - Right Knee Immobilizer - Right: Discontinue once straight leg raise with < 10 degree lag Restrictions Weight Bearing Restrictions: No Other Position/Activity Restrictions: WBAT     Mobility  Bed Mobility               General bed mobility comments: in recliner    Transfers Overall transfer level: Needs assistance Equipment used: Rolling walker (2 wheels) Transfers: Sit to/from Stand Sit to Stand: Supervision;Modified independent (Device/Increase time)           General transfer comment: verbal cues for UE and LE positioning, demonstrates carryover and self corrects    Ambulation/Gait Ambulation/Gait assistance: Supervision Gait Distance (Feet): 200 Feet Assistive device: Rolling  walker (2 wheels) Gait Pattern/deviations: Step-to pattern;Decreased stance time - right Gait velocity: decr     General Gait Details: verbal cues for sequence, RW positioning, posture, step length; a few rest breaks required due to UE fatigue   Stairs Stairs: Yes Stairs assistance: Min guard Stair Management: One rail Left;One rail Right;Step to pattern;Forwards;With crutches Number of Stairs: 7 General stair comments: verbal cues for sequence and safety with left/right  rail and right/left  crutch   Wheelchair Mobility    Modified Rankin (Stroke Patients Only)       Balance                                            Cognition Arousal/Alertness: Awake/alert Behavior During Therapy: WFL for tasks assessed/performed Overall Cognitive Status: Within Functional Limits for tasks assessed                                          Exercises Total Joint Exercises Ankle Circles/Pumps: AROM;Both;10 reps    General Comments        Pertinent Vitals/Pain Pain Assessment: 0-10 Pain Score: 4  Pain Location: Rt knee Pain Descriptors / Indicators: Aching;Sore Pain Intervention(s): Limited activity within patient's tolerance;Monitored during session;Premedicated before session;Repositioned    Home Living                          Prior Function  PT Goals (current goals can now be found in the care plan section) Acute Rehab PT Goals PT Goal Formulation: With patient Time For Goal Achievement: 01/21/21 Potential to Achieve Goals: Good Progress towards PT goals: Progressing toward goals    Frequency    7X/week      PT Plan Current plan remains appropriate    Co-evaluation              AM-PAC PT "6 Clicks" Mobility   Outcome Measure  Help needed turning from your back to your side while in a flat bed without using bedrails?: A Little Help needed moving from lying on your back to sitting on the side of  a flat bed without using bedrails?: A Little Help needed moving to and from a bed to a chair (including a wheelchair)?: A Little Help needed standing up from a chair using your arms (e.g., wheelchair or bedside chair)?: A Little Help needed to walk in hospital room?: A Little Help needed climbing 3-5 steps with a railing? : A Little 6 Click Score: 18    End of Session Equipment Utilized During Treatment: Gait belt Activity Tolerance: Patient tolerated treatment well Patient left: in chair;with call bell/phone within reach Nurse Communication: Mobility status PT Visit Diagnosis: Other abnormalities of gait and mobility (R26.89)     Time: 8299-3716 PT Time Calculation (min) (ACUTE ONLY): 23 min  Charges:  $Gait Training: 23-37 mins                     Baxter Flattery, PT  Acute Rehab Dept ([place]) [phone] Pager [phone]  01/17/2021    [facility] 01/17/2021, 10:23 AM

## 2021-01-17 NOTE — TOC Transition Note (Signed)
Transition of Care [facility]) - CM/SW Discharge Note   Patient Details  Name: JOANE POSTEL MRN: [id number] Date of Birth: [birth year]  Transition of Care [facility]) CM/SW Contact:  Lennart Pall, LCSW Phone Number: 01/17/2021, 10:48 AM   Clinical Narrative:    Met with pt this morning and confirming need for rw and 3n1 commode as well as HHPT follow up. No agency preferences.  DME order placed with [employer] and HH arranged via [facility].  No further TOC needs.   Final next level of care: [place] Barriers to Discharge: Barriers Resolved   Patient Goals and CMS Choice Patient states their goals for this hospitalization and ongoing recovery are:: return home      Discharge Placement                       Discharge Plan and Services                DME Arranged: 3-N-1, Walker rolling DME Agency: [employer] Date DME Agency Contacted: 01/17/21 Time DME Agency Contacted: [id number] Representative spoke with at DME Agency: [place]: PT [place]   Time [place]: [place] Representative spoke with at [place]: [place] Determinants of Health ([place]) Interventions     Readmission Risk Interventions No flowsheet data found.

## 2021-01-17 NOTE — Progress Notes (Signed)
Provided discharge education/instructions, all questions and concerns addressed, Pt not in acute distress, DME (RW and BSC) delivered to room, Pt to discharge home with belongings accompanied by family.

## 2021-01-17 NOTE — Plan of Care (Signed)

## 2021-01-17 NOTE — Progress Notes (Signed)
Patient ID: Kayla Larsen, female   DOB: 1970-03-12, 51 y.o.   MRN: [id number] Making progress overall.  Vitals stable and right knee stable.  Can be discharged to home today.

## 2021-01-17 NOTE — Discharge Summary (Signed)
Patient ID: Kayla Larsen MRN: [id number] DOB/AGE: 1970/11/20 51 y.o.  Admit date: 01/14/2021 Discharge date: 01/17/2021  Admission Diagnoses:  Principal Problem:   Primary osteoarthritis of right knee Active Problems:   Status post right knee replacement   Status post total right knee replacement   Discharge Diagnoses:  Same  Past Medical History:  Diagnosis Date   Acanthosis nigricans    Anemia    Arthritis of knee    External hemorrhoid    History of blood transfusion 2009   2 units after hysterectomy   Hypertension    Obesity    Plantar fasciitis of left foot    Sleep apnea 2010   borderline no cpap used   Wears glasses     Surgeries: Procedure(s): Right TOTAL KNEE ARTHROPLASTY on 01/14/2021   Consultants:   Discharged Condition: Improved  Hospital Course: Kayla Larsen is an 51 y.o. female who was admitted 01/14/2021 for operative treatment ofPrimary osteoarthritis of right knee. Patient has severe unremitting pain that affects sleep, daily activities, and work/hobbies. After pre-op clearance the patient was taken to the operating room on 01/14/2021 and underwent  Procedure(s): Right TOTAL KNEE ARTHROPLASTY.    Patient was given perioperative antibiotics:  Anti-infectives (From admission, onward)    Start     Dose/Rate Route Frequency Ordered Stop   01/14/21 1800  ceFAZolin (ANCEF) IVPB 1 g/50 mL premix        1 g 100 mL/hr over 30 Minutes Intravenous Every 6 hours 01/14/21 1603 01/15/21 0052   01/14/21 0930  ceFAZolin (ANCEF) IVPB 2g/100 mL premix        2 g 200 mL/hr over 30 Minutes Intravenous On call to O.R. 01/14/21 3570 01/14/21 1223        Patient was given sequential compression devices, early ambulation, and chemoprophylaxis to prevent DVT.  Patient benefited maximally from hospital stay and there were no complications.    Recent vital signs: Patient Vitals for the past 24 hrs:  BP Temp Temp src Pulse Resp SpO2  01/17/21 0529 (!) 141/88 99.7 F  (37.6 C) Oral 81 18 92 %  01/16/21 2145 127/63 98.9 F (37.2 C) Oral 88 16 97 %  01/16/21 1408 (!) 114/59 98.5 F (36.9 C) Oral 74 18 99 %  01/16/21 0940 (!) 156/70 99.8 F (37.7 C) Oral 75 17 95 %     Recent laboratory studies:  Recent Labs    01/15/21 0304  WBC 11.3*  HGB 9.6*  HCT 30.6*  PLT 310  NA 137  K 3.8  CL 105  CO2 25  BUN 16  CREATININE 0.61  GLUCOSE 147*  CALCIUM 9.1     Discharge Medications:   Allergies as of 01/17/2021       Reactions   Amlodipine    Ankle swelling at 10mg    Lisinopril    Cough        Medication List     TAKE these medications    acetaminophen 650 MG CR tablet Commonly known as: TYLENOL Take 650 mg by mouth daily.   allopurinol 100 MG tablet Commonly known as: ZYLOPRIM Take 100 mg by mouth daily.   aspirin 81 MG chewable tablet Chew 1 tablet (81 mg total) by mouth 2 (two) times daily.   CRANBERRY PO Take 1 capsule by mouth daily.   diclofenac 75 MG EC tablet Commonly known as: VOLTAREN TAKE 1 TABLET(75 MG) BY MOUTH TWICE DAILY What changed: See the new instructions.   diclofenac Sodium 1 %  Gel Commonly known as: VOLTAREN Apply 1 application topically daily as needed (pain).   ferrous sulfate 325 (65 FE) MG tablet Take 325 mg by mouth daily.   multivitamin with minerals Tabs tablet Take 1 tablet by mouth daily.   Olmesartan-amLODIPine-HCTZ 40-5-12.5 MG Tabs TAKE 1 TABLET BY MOUTH DAILY   oxyCODONE 5 MG immediate release tablet Commonly known as: Oxy IR/ROXICODONE Take 1-2 tablets (5-10 mg total) by mouth every 4 (four) hours as needed for moderate pain (pain score 4-6).   potassium chloride 10 MEQ tablet Commonly known as: KLOR-CON TAKE 1 TABLET(10 MEQ) BY MOUTH DAILY   tiZANidine 4 MG tablet Commonly known as: Zanaflex Take 1 tablet (4 mg total) by mouth every 8 (eight) hours as needed for muscle spasms.   tolterodine 4 MG 24 hr capsule Commonly known as: DETROL LA TAKE 1 CAPSULE(4 MG) BY MOUTH  DAILY   TURMERIC PO Take 2 capsules by mouth daily.   Vitamin D 50 MCG (2000 UT) tablet Take 2,000 Units by mouth daily.               Durable Medical Equipment  (From admission, onward)           Start     Ordered   01/17/21 0736  For home use only DME Bedside commode  Once       Question:  Patient needs a bedside commode to treat with the following condition  Answer:  Status post right knee replacement   01/17/21 0735   01/14/21 1604  DME 3 n 1  Once        01/14/21 1603   01/14/21 1604  DME Walker rolling  Once       Question Answer Comment  Walker: With 5 Inch Wheels   Patient needs a walker to treat with the following condition Status post right knee replacement      01/14/21 1603            Diagnostic Studies: DG Knee Right Port  Result Date: 01/14/2021 CLINICAL DATA:  Right knee replacement EXAM: PORTABLE RIGHT KNEE - 1-2 VIEW COMPARISON:  Right knee radiograph 11/16/2020 FINDINGS: There is a new 3 component total knee arthroplasty in normal alignment out evidence of loosening or periprosthetic fracture. Expected soft tissue changes. Small joint effusion. IMPRESSION: Right total knee arthroplasty without evidence of hardware complication. Electronically Signed   By: Maurine Simmering M.D.   On: 01/14/2021 15:16    Disposition: Discharge disposition: 01-Home or Self Care       Discharge Instructions     Discharge patient   Complete by: As directed    Can discharge the patient to home this afternoon.   Discharge disposition: 01-Home or Self Care   Discharge patient date: 01/17/2021        Follow-up Information     [name], MD Follow up in 2 week(s).   Specialty: Orthopedic Surgery Contact information: [address] [place] [address] [phone]                  Signed: [name] 01/17/2021, 7:36 AM

## 2021-01-17 NOTE — Anesthesia Postprocedure Evaluation (Signed)
Anesthesia Post Note  Patient: Kayla Larsen  Procedure(s) Performed: Right TOTAL KNEE ARTHROPLASTY (Right: Knee)     Patient location during evaluation: PACU Anesthesia Type: Spinal Level of consciousness: awake and alert and oriented Pain management: pain level controlled Vital Signs Assessment: post-procedure vital signs reviewed and stable Respiratory status: spontaneous breathing, nonlabored ventilation and respiratory function stable Cardiovascular status: blood pressure returned to baseline Postop Assessment: no apparent nausea or vomiting, spinal receding, no headache and no backache Anesthetic complications: no   No notable events documented.           Marthenia Rolling

## 2021-01-19 ENCOUNTER — Encounter (HOSPITAL_COMMUNITY): Payer: Self-pay | Admitting: Orthopaedic Surgery

## 2021-01-21 ENCOUNTER — Other Ambulatory Visit: Payer: Self-pay | Admitting: Orthopaedic Surgery

## 2021-01-21 ENCOUNTER — Telehealth: Payer: Self-pay | Admitting: Orthopaedic Surgery

## 2021-01-21 MED ORDER — OXYCODONE HCL 5 MG PO TABS: 5.0000 mg | ORAL_TABLET | ORAL | 0 refills | Status: DC | PRN

## 2021-01-21 NOTE — Telephone Encounter (Signed)
Pt called stating she needs a refill of her oxycodone 5 mg rx called in. Pt would like to be notified when that's done please.   [phone]

## 2021-01-25 NOTE — Addendum Note (Signed)
Addended by: Minette Headland A on: 01/25/2021 02:31 PM   Modules accepted: Orders

## 2021-01-26 ENCOUNTER — Telehealth: Payer: Self-pay | Admitting: Orthopaedic Surgery

## 2021-01-26 ENCOUNTER — Other Ambulatory Visit: Payer: Self-pay | Admitting: Orthopaedic Surgery

## 2021-01-26 MED ORDER — OXYCODONE HCL 5 MG PO TABS: 5.0000 mg | ORAL_TABLET | ORAL | 0 refills | Status: DC | PRN

## 2021-01-26 NOTE — Telephone Encounter (Signed)
Received $25.00 cash,medical records release form and FMLA/Disability from patient     Forwarding to [employer] today

## 2021-01-27 ENCOUNTER — Other Ambulatory Visit: Payer: Self-pay

## 2021-01-27 ENCOUNTER — Encounter: Payer: Self-pay | Admitting: Orthopaedic Surgery

## 2021-01-27 ENCOUNTER — Ambulatory Visit (INDEPENDENT_AMBULATORY_CARE_PROVIDER_SITE_OTHER): Payer: 59 | Admitting: Orthopaedic Surgery

## 2021-01-27 DIAGNOSIS — Z96651 Presence of right artificial knee joint: Secondary | ICD-10-CM

## 2021-01-27 NOTE — Progress Notes (Signed)
The patient is 2 weeks status post a right total knee arthroplasty.  She is on 51 years old.  She has been having home health therapy.  Her calf is soft on the right side.  She can flex and extend her foot and ankle.  The staples been removed and Steri-Strips applied to her knee incision.  She has almost full extension but I can only flex her to about 70 degrees.  I stressed the importance of aggressive outpatient physical therapy.  We will see if we can get this set up within the [facility] system soon.  She understands this as well.  I will see her back in 4 weeks to see how she is doing overall but no x-rays are needed.

## 2021-01-30 ENCOUNTER — Other Ambulatory Visit: Payer: Self-pay | Admitting: Orthopaedic Surgery

## 2021-01-31 ENCOUNTER — Other Ambulatory Visit: Payer: Self-pay | Admitting: Medical

## 2021-01-31 MED ORDER — ACETAMINOPHEN ER 650 MG PO TBCR
650.0000 mg | EXTENDED_RELEASE_TABLET | Freq: Two times a day (BID) | ORAL | 0 refills | Status: DC
  Filled 2021-01-31: qty 60, 30d supply, fill #0

## 2021-01-31 MED ORDER — TIZANIDINE HCL 4 MG PO TABS: 4.0000 mg | ORAL_TABLET | Freq: Three times a day (TID) | ORAL | 0 refills | Status: DC | PRN

## 2021-01-31 MED ORDER — OXYCODONE HCL 5 MG PO TABS: 5.0000 mg | ORAL_TABLET | ORAL | 0 refills | Status: DC | PRN

## 2021-01-31 NOTE — Therapy (Signed)
OUTPATIENT PHYSICAL THERAPY LOWER EXTREMITY EVALUATION   Patient Name: Kayla Larsen MRN: [id number] DOB:[birth year], 51 y.o., female Today's Date: 02/01/2021   PT End of Session - 02/01/21 0908     Visit Number 1    Number of Visits 21    Date for PT Re-Evaluation 04/26/21    Authorization Type [employer]    PT Start Time 0910    PT Stop Time 0945    PT Time Calculation (min) 35 min    Activity Tolerance Patient tolerated treatment well    Behavior During Therapy [facility] for tasks assessed/performed             Past Medical History:  Diagnosis Date   Acanthosis nigricans    Anemia    Arthritis of knee    External hemorrhoid    History of blood transfusion 2009   2 units after hysterectomy   Hypertension    Obesity    Plantar fasciitis of left foot    Sleep apnea 2010   borderline no cpap used   Wears glasses    Past Surgical History:  Procedure Laterality Date   CESAREAN SECTION     x 2   COLONOSCOPY  2009   done for anemia eval; Dr. Benson Norway   COLONOSCOPY  01/25/2018   multiple aphthous ulcers in terminal ileum likely d/t NSAIDs, diverticulosis in sigmoid colon, Dr. Sappington Cellar   KNEE SURGERY  10/2015   knot removed from R knee   LAPAROTOMY Left 07/30/2012   Procedure: LEFT SALPINGO OOPHERECTOMY,  OMENTECTOMY, AND LYSIS OF ADHESIONS;  Surgeon: Alvino Chapel, MD;  Location: [facility];  Service: Gynecology;  Laterality: Left;   OOPHORECTOMY  2014   left    TOTAL ABDOMINAL HYSTERECTOMY  2009   TOTAL KNEE ARTHROPLASTY Right 01/14/2021   Procedure: Right TOTAL KNEE ARTHROPLASTY;  Surgeon: [name], MD;  Location: [facility];  Service: Orthopedics;  Laterality: Right;  RNFA   Patient Active Problem List   Diagnosis Date Noted   Status post total right knee replacement 01/16/2021   Status post right knee replacement 01/14/2021   Encounter for health maintenance examination in adult 12/27/2020   Screening for heart disease 12/27/2020   History of  gout 12/27/2020   Iron deficiency 12/27/2020   Screening for diabetes mellitus 12/27/2020   BMI 38.0-38.9,adult 12/27/2020   OAB (overactive bladder) 12/27/2020   Hypokalemia 12/27/2020   Primary osteoarthritis of right knee 11/16/2020   H/O: hysterectomy 09/21/2020   Vaccine counseling 11/20/2019   Encounter for screening mammogram for malignant neoplasm of breast 11/20/2019   Gout of foot 11/20/2019   Encounter for hepatitis C screening test for low risk patient 11/20/2019   Primary osteoarthritis of both knees 05/14/2019   Diverticulosis 11/19/2018   Vitamin D deficiency 11/24/2016   S/P right knee arthroscopy 12/13/2015   Obesity with serious comorbidity 05/03/2015   Overactive bladder 05/03/2015   Essential hypertension 05/03/2015   Routine general medical examination at a health care facility 05/03/2015   Arthritis, senescent 05/03/2015   Hypertriglyceridemia 05/03/2015   Impaired fasting blood sugar 05/03/2015    PCP: Carlena Hurl, PA-C  REFERRING PROVIDER: [name]*  REFERRING DIAG: W23.762 (ICD-10-CM) - Status post total right knee replacement  THERAPY DIAG:  Stiffness of right knee, not elsewhere classified  Muscle weakness (generalized)  Difficulty in walking, not elsewhere classified  ONSET DATE: DOS: 01/14/2021  SUBJECTIVE:   SUBJECTIVE STATEMENT: Pt is a 51 y/o F s/p R TKA performed  on 01/14/2021. She discharged from acute setting with HHPT and has been doing well post surgery. Notes most pain with R knee flexion and walking. Denies calf pain or any red flag signs. Would like to get back to walking at the park and walking her dogs. Does state "my left knee is just as bad, they will do that one soon too".   PERTINENT HISTORY: Pt is a 51 y/o F s/p R TKA performed by Dr. Ninfa Linden on 01/14/2021  PAIN:  Are you having pain? Yes NPRS scale: 7/10 Pain location: R medial knee PAIN TYPE: sharp and tight Pain description: intermittent   Aggravating factors: touch, bending forward Relieving factors: ice, compression  PRECAUTIONS: None  WEIGHT BEARING RESTRICTIONS Yes WBAT  FALLS:  Has patient fallen in last 6 months? No, Number of falls: N/A  LIVING ENVIRONMENT: Lives with: lives with their family Lives in: House/apartment Stairs: Yes; Internal: 12 steps; bilateral but cannot reach both Has following equipment at home: Walker - 2 wheeled and Crutches  OCCUPATION: [employer]  PLOF: Independent and Independent with basic ADLs  PATIENT GOALS: Pt wants to improve walking post surgery   OBJECTIVE:   PATIENT SURVEYS:  FOTO 59% function; 71% predicted   COGNITION:  Overall cognitive status: Within functional limits for tasks assessed     SENSATION:  Light touch: Appears intact  POSTURE:  Medium body habitus, L knee varus, increased swelling noted in R knee  PALPATION: TTP to R medial knee  LE AROM/PROM:  A/PROM Right 02/01/2021 Left 02/01/2021  Knee flexion 60   Knee extension 15    (Blank rows = not tested)   LOWER EXTREMITY SPECIAL TESTS:  N/A  FUNCTIONAL TESTS:  30"STS: 8 reps with UE assist  GAIT: Distance walked: 74ft Assistive device utilized: Environmental consultant - 2 wheeled Level of assistance: Modified independence Comments: antalgic gait, decreased R knee flexion    TODAY'S TREATMENT: Therapeutic Exercise: Seated heel slide x 5 - 10" R Seated quad set x 5 - 5" R LAQ x 10 R Supine quad set x 5 - 5" R Supine heel slide w/ strap x 5 - 10" R   PATIENT EDUCATION:  Education details: eval findings, FOTO, HEP, POC Person educated: Patient Education method: Explanation, Demonstration, and Handouts Education comprehension: verbalized understanding and returned demonstration   HOME EXERCISE PROGRAM: Access Code: [email or web address]  ASSESSMENT:  CLINICAL IMPRESSION: Pt is a 51 y/o F s/p R TKA performed by Dr. Ninfa Linden on 01/14/2021. Objective impairments include Abnormal gait, decreased activity  tolerance, decreased balance, decreased mobility, decreased ROM, decreased strength, and pain. These impairments are limiting patient from cleaning, community activity, driving, occupation, and shopping. Personal factors including Fitness and 1 comorbidity: HTN  are also affecting patient's functional outcome. Pt knee AROM is below where it needs to be at this point post surgery. Patient will benefit from skilled PT to address above impairments and improve overall function.  REHAB POTENTIAL: Good  CLINICAL DECISION MAKING: Stable/uncomplicated  EVALUATION COMPLEXITY: Low   GOALS: Goals reviewed with patient? No  SHORT TERM GOALS:  STG Name Target Date Goal status  1 Pt will be compliant and knowledgeable with initial HEP for improved comfort and carryover Baseline: initial HEP given 02/22/2021 INITIAL  2 Pt will self report R knee pain no greater than 6/10 for improved comfort and functional ability Baseline: 8/10 at worst 02/22/2021 INITIAL   LONG TERM GOALS:   LTG Name Target Date Goal status  1 Pt will improve FOTO function score to  no less than 71% as proxy for functional improvement Baseline: 59% function 04/26/2021 INITIAL  2 Pt will improve R knee AROM to no less than 3-110 degrees for improved functional mobility Baseline: 15-60 degrees 04/26/2021 INITIAL  3 Pt will self report R knee pain no greater than 3/10 for improved comfort and functional ability Baseline: 8/10 at worst 04/26/2021 INITIAL  4 Pt will increase reps in 30" STS to no less than 10 without use of UE for improved balance and functional mobility Baseline: 04/26/2021 INITIAL  5 Pt will be able to amb up/down 10 stairs with reciprocal gait and no increase in pain for improved community navigation Baseline: unable 04/26/2021 INITIAL   PLAN: PT FREQUENCY: 2x/week  PT DURATION: 12 weeks  PLANNED INTERVENTIONS: Therapeutic exercises, Therapeutic activity, Neuro Muscular re-education, Balance training, Gait training,  Patient/Family education, Joint mobilization, Aquatic Therapy, Dry Needling, Cryotherapy, Moist heat, Vasopneumatic device, and Manual therapy  PLAN FOR NEXT SESSION: assess HEP response, PROM to R knee, progress as able   Ward Chatters, PT 02/01/2021, 9:49 AM

## 2021-02-01 ENCOUNTER — Telehealth: Payer: Self-pay

## 2021-02-01 ENCOUNTER — Other Ambulatory Visit: Payer: Self-pay

## 2021-02-01 ENCOUNTER — Ambulatory Visit: Payer: 59 | Attending: Orthopaedic Surgery

## 2021-02-01 DIAGNOSIS — Z96651 Presence of right artificial knee joint: Secondary | ICD-10-CM | POA: Diagnosis not present

## 2021-02-01 DIAGNOSIS — M6281 Muscle weakness (generalized): Secondary | ICD-10-CM | POA: Diagnosis present

## 2021-02-01 DIAGNOSIS — M25661 Stiffness of right knee, not elsewhere classified: Secondary | ICD-10-CM | POA: Diagnosis not present

## 2021-02-01 DIAGNOSIS — R262 Difficulty in walking, not elsewhere classified: Secondary | ICD-10-CM | POA: Diagnosis present

## 2021-02-01 NOTE — Telephone Encounter (Signed)
Transition Care Management Follow-up Telephone Call Date of discharge and from where: 01/17/21 from [facility] How have you been since you were released from the hospital? "I am doing fine" Any questions or concerns? No  Items Reviewed: Did the pt receive and understand the discharge instructions provided? Yes  Medications obtained and verified? Yes  Other? No  Any new allergies since your discharge? Yes  reports itching, but is not clear which medication caused the itching. Reports was given Benadryl while in the hospital. MD is aware. Reports has not needed to take benadryl since yesterday. Dietary orders reviewed? Yes Do you have support at home? Yes   Home Care and Equipment/Supplies: Attending outpatient rehab at [facility] outpatient rehab now. Were home health services ordered? Yes- reports had home health therapy x2 weeks. If so, what is the name of the agency? Does not remember  Has the agency set up a time to come to the patient's home? Yes. Were any new equipment or medical supplies ordered?  No What is the name of the medical supply agency? Not applicable Were you able to get the supplies/equipment? not applicable Do you have any questions related to the use of the equipment or supplies? No  Functional Questionnaire: (I = Independent and D = Dependent) ADLs: I  Bathing/Dressing- I  Meal Prep- I  Eating- I  Maintaining continence- I  Transferring/Ambulation- I  Managing Meds- I  Follow up appointments reviewed:  PCP Hospital f/u appt confirmed? Following up with specialist  Specialist Hospital f/u appt confirmed? Yes   Saw Dr. Kathrynn Speed on 01/27/21 and Scheduled to see Dr. Kathrynn Speed on 02/24/21 @ 3:00 pm. Are transportation arrangements needed? No  If their condition worsens, is the pt aware to call PCP or go to the Emergency Dept.? Yes Was the patient provided with contact information for the PCP's office or ED? Yes Was to pt encouraged to  call back with questions or concerns? Yes   Thea Silversmith, RN, MSN, BSN, [place] Care Management Coordinator [phone]

## 2021-02-01 NOTE — Addendum Note (Signed)
Addended by: Ward Chatters on: 02/01/2021 09:53 AM   Modules accepted: Orders

## 2021-02-03 ENCOUNTER — Other Ambulatory Visit: Payer: Self-pay | Admitting: Orthopaedic Surgery

## 2021-02-03 MED ORDER — OXYCODONE HCL 5 MG PO TABS: 5.0000 mg | ORAL_TABLET | ORAL | 0 refills | Status: DC | PRN

## 2021-02-07 ENCOUNTER — Other Ambulatory Visit: Payer: Self-pay | Admitting: Orthopaedic Surgery

## 2021-02-07 MED ORDER — OXYCODONE HCL 5 MG PO TABS: 5.0000 mg | ORAL_TABLET | ORAL | 0 refills | Status: DC | PRN

## 2021-02-09 ENCOUNTER — Other Ambulatory Visit: Payer: Self-pay

## 2021-02-09 ENCOUNTER — Ambulatory Visit: Payer: 59 | Attending: Orthopaedic Surgery | Admitting: Physical Therapy

## 2021-02-09 ENCOUNTER — Encounter: Payer: Self-pay | Admitting: Physical Therapy

## 2021-02-09 DIAGNOSIS — R262 Difficulty in walking, not elsewhere classified: Secondary | ICD-10-CM | POA: Insufficient documentation

## 2021-02-09 DIAGNOSIS — M6281 Muscle weakness (generalized): Secondary | ICD-10-CM | POA: Insufficient documentation

## 2021-02-09 DIAGNOSIS — M25661 Stiffness of right knee, not elsewhere classified: Secondary | ICD-10-CM | POA: Insufficient documentation

## 2021-02-09 NOTE — Therapy (Signed)
OUTPATIENT PHYSICAL THERAPY TREATMENT NOTE   Patient Name: Kayla Larsen MRN: [id number] DOB:1971/01/07, 51 y.o., female Today's Date: 02/09/2021  PCP: Carlena Hurl, PA-C REFERRING PROVIDER: Carlena Hurl, PA-C   PT End of Session - 02/09/21 1027     Visit Number 2    Number of Visits 21    Date for PT Re-Evaluation 04/26/21    PT Start Time 1017    PT Stop Time 1110    PT Time Calculation (min) 53 min             Past Medical History:  Diagnosis Date   Acanthosis nigricans    Anemia    Arthritis of knee    External hemorrhoid    History of blood transfusion 2009   2 units after hysterectomy   Hypertension    Obesity    Plantar fasciitis of left foot    Sleep apnea 2010   borderline no cpap used   Wears glasses    Past Surgical History:  Procedure Laterality Date   CESAREAN SECTION     x 2   COLONOSCOPY  2009   done for anemia eval; Dr. Benson [place]   COLONOSCOPY  01/25/2018   multiple aphthous ulcers in terminal ileum likely d/t NSAIDs, diverticulosis in sigmoid colon, Dr. [place] Cellar   KNEE SURGERY  10/2015   knot removed from R knee   LAPAROTOMY Left 07/30/2012   Procedure: LEFT SALPINGO OOPHERECTOMY,  OMENTECTOMY, AND LYSIS OF ADHESIONS;  Surgeon: Alvino Chapel, MD;  Location: [facility];  Service: Gynecology;  Laterality: Left;   OOPHORECTOMY  2014   left    TOTAL ABDOMINAL HYSTERECTOMY  2009   TOTAL KNEE ARTHROPLASTY Right 01/14/2021   Procedure: Right TOTAL KNEE ARTHROPLASTY;  Surgeon: [name], MD;  Location: [facility];  Service: Orthopedics;  Laterality: Right;  RNFA   Patient Active Problem List   Diagnosis Date Noted   Status post total right knee replacement 01/16/2021   Status post right knee replacement 01/14/2021   Encounter for health maintenance examination in adult 12/27/2020   Screening for heart disease 12/27/2020   History of gout 12/27/2020   Iron deficiency 12/27/2020   Screening for diabetes mellitus  12/27/2020   BMI 38.0-38.9,adult 12/27/2020   OAB (overactive bladder) 12/27/2020   Hypokalemia 12/27/2020   Primary osteoarthritis of right knee 11/16/2020   H/O: hysterectomy 09/21/2020   Vaccine counseling 11/20/2019   Encounter for screening mammogram for malignant neoplasm of breast 11/20/2019   Gout of foot 11/20/2019   Encounter for hepatitis C screening test for low risk patient 11/20/2019   Primary osteoarthritis of both knees 05/14/2019   Diverticulosis 11/19/2018   Vitamin D deficiency 11/24/2016   S/P right knee arthroscopy 12/13/2015   Obesity with serious comorbidity 05/03/2015   Overactive bladder 05/03/2015   Essential hypertension 05/03/2015   Routine general medical examination at a health care facility 05/03/2015   Arthritis, senescent 05/03/2015   Hypertriglyceridemia 05/03/2015   Impaired fasting blood sugar 05/03/2015   PCP: Carlena Hurl, PA-C   REFERRING PROVIDER: [name]*   REFERRING DIAG: B76.283 (ICD-10-CM) - Status post total right knee replacement    THERAPY DIAG:  Muscle weakness (generalized)  Difficulty in walking, not elsewhere classified  Stiffness of right knee, not elsewhere classified  PERTINENT HISTORY: Pt is a 51 y/o F s/p R TKA performed by Dr. Ninfa Linden on 01/14/2021   ONSET DATE: DOS: 01/14/2021  PRECAUTIONS: None  Weight Bearing Restrictions: Yes.  WBAT  SUBJECTIVE: I took a pain pill. It is mostly stiff.   PAIN:  Are you having pain? Yes NPRS scale: 4/10 Pain location: R medial knee PAIN TYPE: sharp and tight Pain description: intermittent  Aggravating factors: touch, bending forward Relieving factors: ice, compression     OBJECTIVE:    PATIENT SURVEYS:  FOTO 59% function; 71% predicted    COGNITION:          Overall cognitive status: Within functional limits for tasks assessed                        SENSATION:          Light touch: Appears intact   POSTURE:  Medium body habitus, L knee  varus, increased swelling noted in R knee   PALPATION: TTP to R medial knee   LE AROM/PROM:   A/PROM Right 02/01/2021 Left 02/01/2021 Right 02/09/21  Knee flexion 60   73 AAROM  Knee extension 15      (Blank rows = not tested)     LOWER EXTREMITY SPECIAL TESTS:  N/A   FUNCTIONAL TESTS:  30"STS: 8 reps with UE assist   GAIT: Distance walked: 6ft Assistive device utilized: Environmental consultant - 2 wheeled Level of assistance: Modified independence Comments: antalgic gait, decreased R knee flexion       TODAY'S TREATMENT: [facility] Adult PT Treatment:                                                DATE: 02/09/21 Therapeutic Exercise: Nustep L2 for ROM x 5 minutes  Seated heel slide x 10 - 10" R Seated quad set x 510- 5" R LAQ x 10 R Supine quad set x 15 - 5" R-  Supine heel slide w/ strap x 15- 10" R Supine SAQ x 15 right SLR x 15 right Side hip abduction x 10  Manual Therapy: Patella mobs all planes , passive knee flexion, passive hamstring stretch Neuromuscular re-ed: N/A Therapeutic Activity: N/A Modalities: N/A Self Care: N/A   Therapeutic Exercise: 02/01/21 Seated heel slide x 5 - 10" R Seated quad set x 5 - 5" R LAQ x 10 R Supine quad set x 5 - 5" R Supine heel slide w/ strap x 5 - 10" R     PATIENT EDUCATION:  Education details: continue HEP Person educated: Patient Education method: Explanation, Demonstration, and Handouts Education comprehension: verbalized understanding and returned demonstration     HOME EXERCISE PROGRAM: Access Code: [email or web address]   ASSESSMENT:   CLINICAL IMPRESSION: Pt arrives reporting min compliance with HEP. She left her HEP in the car and did not retrieve it. She has mostly bee doing squats at her walker. Reviewed HEP and provided education on the need to complete TKA specific exercises to maximize outcomes. She has improved her knee flexion AAROM to 73 degrees.   REHAB POTENTIAL: Good   CLINICAL DECISION MAKING: Stable/uncomplicated    EVALUATION COMPLEXITY: Low     GOALS: Goals reviewed with patient? No   SHORT TERM GOALS:   STG Name Target Date Goal status  1 Pt will be compliant and knowledgeable with initial HEP for improved comfort and carryover Baseline: initial HEP given 02/22/2021 INITIAL  2 Pt will self report R knee pain no greater than 6/10 for improved comfort and functional ability Baseline: 8/10 at  worst 02/22/2021 INITIAL    LONG TERM GOALS:    LTG Name Target Date Goal status  1 Pt will improve FOTO function score to no less than 71% as proxy for functional improvement Baseline: 59% function 04/26/2021 INITIAL  2 Pt will improve R knee AROM to no less than 3-110 degrees for improved functional mobility Baseline: 15-60 degrees 04/26/2021 INITIAL  3 Pt will self report R knee pain no greater than 3/10 for improved comfort and functional ability Baseline: 8/10 at worst 04/26/2021 INITIAL  4 Pt will increase reps in 30" STS to no less than 10 without use of UE for improved balance and functional mobility Baseline: 04/26/2021 INITIAL  5 Pt will be able to amb up/down 10 stairs with reciprocal gait and no increase in pain for improved community navigation Baseline: unable 04/26/2021 INITIAL    PLAN: PT FREQUENCY: 2x/week   PT DURATION: 12 weeks   PLANNED INTERVENTIONS: Therapeutic exercises, Therapeutic activity, Neuro Muscular re-education, Balance training, Gait training, Patient/Family education, Joint mobilization, Aquatic Therapy, Dry Needling, Cryotherapy, Moist heat, Vasopneumatic device, and Manual therapy   PLAN FOR NEXT SESSION: assess HEP response, PROM to R knee, progress as able      Hessie Diener, PTA 02/09/21 11:06 AM Phone: [phone] Fax: [phone]

## 2021-02-11 ENCOUNTER — Encounter: Payer: Self-pay | Admitting: Physical Therapy

## 2021-02-11 ENCOUNTER — Ambulatory Visit: Payer: 59 | Admitting: Physical Therapy

## 2021-02-11 ENCOUNTER — Other Ambulatory Visit: Payer: Self-pay

## 2021-02-11 ENCOUNTER — Other Ambulatory Visit: Payer: Self-pay | Admitting: Orthopaedic Surgery

## 2021-02-11 DIAGNOSIS — M6281 Muscle weakness (generalized): Secondary | ICD-10-CM

## 2021-02-11 DIAGNOSIS — M25661 Stiffness of right knee, not elsewhere classified: Secondary | ICD-10-CM

## 2021-02-11 DIAGNOSIS — R262 Difficulty in walking, not elsewhere classified: Secondary | ICD-10-CM

## 2021-02-11 MED ORDER — OXYCODONE HCL 5 MG PO TABS: 5.0000 mg | ORAL_TABLET | ORAL | 0 refills | Status: DC | PRN

## 2021-02-11 MED ORDER — TIZANIDINE HCL 4 MG PO TABS: 4.0000 mg | ORAL_TABLET | Freq: Three times a day (TID) | ORAL | 0 refills | Status: DC | PRN

## 2021-02-11 NOTE — Therapy (Signed)
OUTPATIENT PHYSICAL THERAPY TREATMENT NOTE   Patient Name: Kayla Larsen MRN: [id number] DOB:1970/04/18, 51 y.o., female Today's Date: 02/11/2021  PCP: Carlena Hurl, PA-C REFERRING PROVIDER: [name]*   PT End of Session - 02/11/21 1023     Visit Number 3    Number of Visits 21    Date for PT Re-Evaluation 04/26/21    Authorization Type [employer]    PT Start Time 1017    PT Stop Time 1100    PT Time Calculation (min) 43 min             Past Medical History:  Diagnosis Date   Acanthosis nigricans    Anemia    Arthritis of knee    External hemorrhoid    History of blood transfusion 2009   2 units after hysterectomy   Hypertension    Obesity    Plantar fasciitis of left foot    Sleep apnea 2010   borderline no cpap used   Wears glasses    Past Surgical History:  Procedure Laterality Date   CESAREAN SECTION     x 2   COLONOSCOPY  2009   done for anemia eval; Dr. Benson [place]   COLONOSCOPY  01/25/2018   multiple aphthous ulcers in terminal ileum likely d/t NSAIDs, diverticulosis in sigmoid colon, Dr. [place] Cellar   KNEE SURGERY  10/2015   knot removed from R knee   LAPAROTOMY Left 07/30/2012   Procedure: LEFT SALPINGO OOPHERECTOMY,  OMENTECTOMY, AND LYSIS OF ADHESIONS;  Surgeon: Alvino Chapel, MD;  Location: [facility];  Service: Gynecology;  Laterality: Left;   OOPHORECTOMY  2014   left    TOTAL ABDOMINAL HYSTERECTOMY  2009   TOTAL KNEE ARTHROPLASTY Right 01/14/2021   Procedure: Right TOTAL KNEE ARTHROPLASTY;  Surgeon: [name], MD;  Location: [facility];  Service: Orthopedics;  Laterality: Right;  RNFA   Patient Active Problem List   Diagnosis Date Noted   Status post total right knee replacement 01/16/2021   Status post right knee replacement 01/14/2021   Encounter for health maintenance examination in adult 12/27/2020   Screening for heart disease 12/27/2020   History of gout 12/27/2020   Iron deficiency 12/27/2020    Screening for diabetes mellitus 12/27/2020   BMI 38.0-38.9,adult 12/27/2020   OAB (overactive bladder) 12/27/2020   Hypokalemia 12/27/2020   Primary osteoarthritis of right knee 11/16/2020   H/O: hysterectomy 09/21/2020   Vaccine counseling 11/20/2019   Encounter for screening mammogram for malignant neoplasm of breast 11/20/2019   Gout of foot 11/20/2019   Encounter for hepatitis C screening test for low risk patient 11/20/2019   Primary osteoarthritis of both knees 05/14/2019   Diverticulosis 11/19/2018   Vitamin D deficiency 11/24/2016   S/P right knee arthroscopy 12/13/2015   Obesity with serious comorbidity 05/03/2015   Overactive bladder 05/03/2015   Essential hypertension 05/03/2015   Routine general medical examination at a health care facility 05/03/2015   Arthritis, senescent 05/03/2015   Hypertriglyceridemia 05/03/2015   Impaired fasting blood sugar 05/03/2015   PCP: Carlena Hurl, PA-C   REFERRING PROVIDER: [name]*   REFERRING DIAG: X90.240 (ICD-10-CM) - Status post total right knee replacement    THERAPY DIAG:  Muscle weakness (generalized)  Difficulty in walking, not elsewhere classified  Stiffness of right knee, not elsewhere classified  PERTINENT HISTORY: Pt is a 51 y/o F s/p R TKA performed by Dr. Ninfa Linden on 01/14/2021   ONSET DATE: DOS: 01/14/2021  PRECAUTIONS: None  Weight Bearing Restrictions: Yes. WBAT  SUBJECTIVE: I am stiff this morning. It locks up when I bend it.   PAIN:  Are you having pain? Yes NPRS scale: 6/10 Pain location: R medial knee PAIN TYPE: sharp and tight Pain description: intermittent  Aggravating factors: touch, bending forward Relieving factors: ice, compression     OBJECTIVE:    PATIENT SURVEYS:  FOTO 59% function; 71% predicted    COGNITION:          Overall cognitive status: Within functional limits for tasks assessed                        SENSATION:          Light touch: Appears intact    POSTURE:  Medium body habitus, L knee varus, increased swelling noted in R knee   PALPATION: TTP to R medial knee   LE AROM/PROM:   A/PROM Right 02/01/2021 Left 02/01/2021 Right 02/09/21  Knee flexion 60   73 AAROM  Knee extension 15      (Blank rows = not tested)     LOWER EXTREMITY SPECIAL TESTS:  N/A   FUNCTIONAL TESTS:  30"STS: 8 reps with UE assist   GAIT: Distance walked: 77ft Assistive device utilized: Environmental consultant - 2 wheeled Level of assistance: Modified independence Comments: antalgic gait, decreased R knee flexion       TODAY'S TREATMENT:  [facility] Adult PT Treatment:                                                DATE: 02/11/21 Therapeutic Exercise: Nustep L5 for ROM x 5 minutes , UE/LE [employer]: with RW and exaggerated knee/hip flexion to heel strike 150 feet Gait up and down stairs, bilateral hand rails , step to pattern on descend Step stretch for right knee flexion 10 sec x 10 Heel raises x 15 Weight shifting to RLE at RW 10 sec x 5 Left hip march with SLS on RLE -1 UE on RW for support  Seated right knee flexion AAROM with over pressure from LLE Scoot stretch seated EOM 3 x 15 sec  Long sitting right hamstring stretch 30 sec x 2 Supine quad set x 15 - 5" R-  SLR x 15 Supine heel slide w/ strap x 15- 10" R Hip flexor stretch passive off edge of mat   Manual Therapy: Patella mobs all planes , passive knee flexion, passive hamstring stretch Neuromuscular re-ed: N/A Therapeutic Activity: N/A Modalities: N/A Self Care: N/A   [facility] Adult PT Treatment:                                                DATE: 02/09/21 Therapeutic Exercise: Nustep L2 for ROM x 5 minutes  Seated heel slide x 10 - 10" R Seated quad set x 510- 5" R LAQ x 10 R Supine quad set x 15 - 5" R-  Supine heel slide w/ strap x 15- 10" R Supine SAQ x 15 right SLR x 15 right Side hip abduction x 10  Manual Therapy: Patella mobs all planes , passive knee flexion,  passive hamstring stretch Neuromuscular re-ed: N/A Therapeutic Activity: N/A Modalities: N/A Self Care:  N/A   Therapeutic Exercise: 02/01/21 Seated heel slide x 5 - 10" R Seated quad set x 5 - 5" R LAQ x 10 R Supine quad set x 5 - 5" R Supine heel slide w/ strap x 5 - 10" R     PATIENT EDUCATION:  Education details: continue HEP Person educated: Patient Education method: Explanation, Demonstration, and Handouts Education comprehension: verbalized understanding and returned demonstration     HOME EXERCISE PROGRAM: Access Code: [email or web address]   ASSESSMENT:   CLINICAL IMPRESSION: Patient arrives reporting min compliance with HEP. She arrives with RW and ambulates with bilat knee extension. She reports that she walked with both legs in extension prior to surgery. RW raised to appropriate height today and gait cues provided to flex and knees and hips. She is able to negotiate reciprocal stairs with bilateral hand rails and step to pattern to descend. Continued with AAROM, manual, and quad activation. Better ROM in sitting-82 however supine 73 due to pain at medial knee. Improving knee extension ROM, but with quad lag still present.    REHAB POTENTIAL: Good   CLINICAL DECISION MAKING: Stable/uncomplicated   EVALUATION COMPLEXITY: Low     GOALS: Goals reviewed with patient? No   SHORT TERM GOALS:   STG Name Target Date Goal status  1 Pt will be compliant and knowledgeable with initial HEP for improved comfort and carryover Baseline: initial HEP given 02/22/2021 INITIAL  2 Pt will self report R knee pain no greater than 6/10 for improved comfort and functional ability Baseline: 8/10 at worst 02/22/2021 INITIAL    LONG TERM GOALS:    LTG Name Target Date Goal status  1 Pt will improve FOTO function score to no less than 71% as proxy for functional improvement Baseline: 59% function 04/26/2021 INITIAL  2 Pt will improve R knee AROM to no less than 3-110 degrees for improved  functional mobility Baseline: 15-60 degrees 04/26/2021 INITIAL  3 Pt will self report R knee pain no greater than 3/10 for improved comfort and functional ability Baseline: 8/10 at worst 04/26/2021 INITIAL  4 Pt will increase reps in 30" STS to no less than 10 without use of UE for improved balance and functional mobility Baseline: 04/26/2021 INITIAL  5 Pt will be able to amb up/down 10 stairs with reciprocal gait and no increase in pain for improved community navigation Baseline: unable 04/26/2021 INITIAL    PLAN: PT FREQUENCY: 2x/week   PT DURATION: 12 weeks   PLANNED INTERVENTIONS: Therapeutic exercises, Therapeutic activity, Neuro Muscular re-education, Balance training, Gait training, Patient/Family education, Joint mobilization, Aquatic Therapy, Dry Needling, Cryotherapy, Moist heat, Vasopneumatic device, and Manual therapy   PLAN FOR NEXT SESSION: manual to reduce medial knee pain during flexion , assess HEP response, PROM to R knee, progress as able      Hessie Diener, PTA 02/11/21 11:30 AM Phone: [phone] Fax: [phone]

## 2021-02-16 ENCOUNTER — Encounter: Payer: Self-pay | Admitting: Physical Therapy

## 2021-02-16 ENCOUNTER — Other Ambulatory Visit: Payer: Self-pay

## 2021-02-16 ENCOUNTER — Ambulatory Visit: Payer: 59 | Admitting: Physical Therapy

## 2021-02-16 DIAGNOSIS — M6281 Muscle weakness (generalized): Secondary | ICD-10-CM | POA: Diagnosis not present

## 2021-02-16 DIAGNOSIS — M25661 Stiffness of right knee, not elsewhere classified: Secondary | ICD-10-CM

## 2021-02-16 DIAGNOSIS — R262 Difficulty in walking, not elsewhere classified: Secondary | ICD-10-CM

## 2021-02-16 NOTE — Therapy (Signed)
OUTPATIENT PHYSICAL THERAPY TREATMENT NOTE   Patient Name: Kayla Larsen MRN: [id number] DOB:[birth year], 51 y.o., female Today's Date: 02/16/2021  PCP: Carlena Hurl, PA-C REFERRING PROVIDER: [name]*   PT End of Session - 02/16/21 1027     Visit Number 4    Number of Visits 21    Date for PT Re-Evaluation 04/26/21    Authorization Type [employer]    PT Start Time 1020    PT Stop Time 1059    PT Time Calculation (min) 39 min             Past Medical History:  Diagnosis Date   Acanthosis nigricans    Anemia    Arthritis of knee    External hemorrhoid    History of blood transfusion 2009   2 units after hysterectomy   Hypertension    Obesity    Plantar fasciitis of left foot    Sleep apnea 2010   borderline no cpap used   Wears glasses    Past Surgical History:  Procedure Laterality Date   CESAREAN SECTION     x 2   COLONOSCOPY  2009   done for anemia eval; Dr. Benson [place]   COLONOSCOPY  01/25/2018   multiple aphthous ulcers in terminal ileum likely d/t NSAIDs, diverticulosis in sigmoid colon, Dr. [place] Cellar   KNEE SURGERY  10/2015   knot removed from R knee   LAPAROTOMY Left 07/30/2012   Procedure: LEFT SALPINGO OOPHERECTOMY,  OMENTECTOMY, AND LYSIS OF ADHESIONS;  Surgeon: Alvino Chapel, MD;  Location: [facility];  Service: Gynecology;  Laterality: Left;   OOPHORECTOMY  2014   left    TOTAL ABDOMINAL HYSTERECTOMY  2009   TOTAL KNEE ARTHROPLASTY Right 01/14/2021   Procedure: Right TOTAL KNEE ARTHROPLASTY;  Surgeon: [name], MD;  Location: [facility];  Service: Orthopedics;  Laterality: Right;  RNFA   Patient Active Problem List   Diagnosis Date Noted   Status post total right knee replacement 01/16/2021   Status post right knee replacement 01/14/2021   Encounter for health maintenance examination in adult 12/27/2020   Screening for heart disease 12/27/2020   History of gout 12/27/2020   Iron deficiency 12/27/2020    Screening for diabetes mellitus 12/27/2020   BMI 38.0-38.9,adult 12/27/2020   OAB (overactive bladder) 12/27/2020   Hypokalemia 12/27/2020   Primary osteoarthritis of right knee 11/16/2020   H/O: hysterectomy 09/21/2020   Vaccine counseling 11/20/2019   Encounter for screening mammogram for malignant neoplasm of breast 11/20/2019   Gout of foot 11/20/2019   Encounter for hepatitis C screening test for low risk patient 11/20/2019   Primary osteoarthritis of both knees 05/14/2019   Diverticulosis 11/19/2018   Vitamin D deficiency 11/24/2016   S/P right knee arthroscopy 12/13/2015   Obesity with serious comorbidity 05/03/2015   Overactive bladder 05/03/2015   Essential hypertension 05/03/2015   Routine general medical examination at a health care facility 05/03/2015   Arthritis, senescent 05/03/2015   Hypertriglyceridemia 05/03/2015   Impaired fasting blood sugar 05/03/2015   PCP: Carlena Hurl, PA-C   REFERRING PROVIDER: [name]*   REFERRING DIAG: T01.601 (ICD-10-CM) - Status post total right knee replacement    THERAPY DIAG:  Muscle weakness (generalized)  Difficulty in walking, not elsewhere classified  Stiffness of right knee, not elsewhere classified  PERTINENT HISTORY: Pt is a 51 y/o F s/p R TKA performed by Dr. Ninfa Linden on 01/14/2021   ONSET DATE: DOS: 01/14/2021  PRECAUTIONS: None  Weight Bearing Restrictions: Yes. WBAT  SUBJECTIVE:It still hurts when I bend it on the inside.   PAIN:  Are you having pain? Yes NPRS scale: 5/10 Pain location: R medial knee PAIN TYPE: sharp and tight Pain description: intermittent  Aggravating factors: touch, bending forward Relieving factors: ice, compression     OBJECTIVE:    PATIENT SURVEYS:  FOTO 59% function; 71% predicted    COGNITION:          Overall cognitive status: Within functional limits for tasks assessed                        SENSATION:          Light touch: Appears intact    POSTURE:  Medium body habitus, L knee varus, increased swelling noted in R knee   PALPATION: TTP to R medial knee   LE AROM/PROM:   A/PROM Right 02/01/2021 Left 02/01/2021 Right 02/09/21 Right 02/16/21  Knee flexion 60   73 AAROM 82 AAROM  Knee extension 15       (Blank rows = not tested)     LOWER EXTREMITY SPECIAL TESTS:  N/A   FUNCTIONAL TESTS:  30"STS: 8 reps with UE assist   GAIT: Distance walked: 72ft Assistive device utilized: Environmental consultant - 2 wheeled Level of assistance: Modified independence Comments: antalgic gait, decreased R knee flexion       TODAY'S TREATMENT:  [facility] Adult PT Treatment:                                                DATE: 02/16/21 Therapeutic Exercise: Nustep L5 for ROM x 5 minutes , UE/LE [employer]: with RW and exaggerated knee/hip flexion to heel strike 150 feet Step stretch for right knee flexion 10 sec x 10 Heel raises x 15 Standing gastroc stretch right 30 sec  Seated heel slide with cloth SAQ  x 20 Long sitting right hamstring stretch 30 sec x 2 Supine quad set x 15 - 5" R-  SLR x 15 Supine heel slide w/ strap x 15- 10" R   [facility] Adult PT Treatment:                                                DATE: 02/11/21 Therapeutic Exercise: Nustep L5 for ROM x 5 minutes , UE/LE [employer]: with RW and exaggerated knee/hip flexion to heel strike 150 feet Gait up and down stairs, bilateral hand rails , step to pattern on descend Step stretch for right knee flexion 10 sec x 10 Heel raises x 15 Weight shifting to RLE at RW 10 sec x 5   Scoot stretch seated EOM 3 x 15 sec  Long sitting right hamstring stretch 30 sec x 2 Supine quad set x 15 - 5" R-  SLR x 15 Supine heel slide w/ strap x 15- 10" R Hip flexor stretch passive off edge of mat   Manual Therapy: Patella mobs all planes , passive knee flexion, passive hamstring stretch Neuromuscular re-ed: N/A Therapeutic  Activity: N/A Modalities: N/A Self Care: N/A   [facility] Adult PT Treatment:  DATE: 02/09/21 Therapeutic Exercise: Nustep L2 for ROM x 5 minutes  Seated heel slide x 10 - 10" R Seated quad set x 510- 5" R LAQ x 10 R Supine quad set x 15 - 5" R-  Supine heel slide w/ strap x 15- 10" R Supine SAQ x 15 right SLR x 15 right Side hip abduction x 10  Manual Therapy: Patella mobs all planes , passive knee flexion, passive hamstring stretch Neuromuscular re-ed: N/A Therapeutic Activity: N/A Modalities: N/A Self Care: N/A   Therapeutic Exercise: 02/01/21 Seated heel slide x 5 - 10" R Seated quad set x 5 - 5" R LAQ x 10 R Supine quad set x 5 - 5" R Supine heel slide w/ strap x 5 - 10" R     PATIENT EDUCATION:  Education details: continue HEP Person educated: Patient Education method: Explanation, Demonstration, and Handouts Education comprehension: verbalized understanding and returned demonstration     HOME EXERCISE PROGRAM: Access Code: [email or web address]   ASSESSMENT:   CLINICAL IMPRESSION: Patient reports improved compliance with HEP and continued medial knee pain. Continued with gait training to promote knee flexion with ambulation. Continued with ROM focus using mobs, PROM and AAROM. She tolerated the session well with end range pain.  Able to achieve 88 degrees AAROM of right knee.    REHAB POTENTIAL: Good   CLINICAL DECISION MAKING: Stable/uncomplicated   EVALUATION COMPLEXITY: Low     GOALS: Goals reviewed with patient? No   SHORT TERM GOALS:   STG Name Target Date Goal status  1 Pt will be compliant and knowledgeable with initial HEP for improved comfort and carryover Baseline: initial HEP given 02/22/2021 INITIAL  2 Pt will self report R knee pain no greater than 6/10 for improved comfort and functional ability Baseline: 8/10 at worst 02/22/2021 INITIAL    LONG TERM GOALS:    LTG Name Target Date Goal status  1 Pt  will improve FOTO function score to no less than 71% as proxy for functional improvement Baseline: 59% function 04/26/2021 INITIAL  2 Pt will improve R knee AROM to no less than 3-110 degrees for improved functional mobility Baseline: 15-60 degrees 04/26/2021 INITIAL  3 Pt will self report R knee pain no greater than 3/10 for improved comfort and functional ability Baseline: 8/10 at worst 04/26/2021 INITIAL  4 Pt will increase reps in 30" STS to no less than 10 without use of UE for improved balance and functional mobility Baseline: 04/26/2021 INITIAL  5 Pt will be able to amb up/down 10 stairs with reciprocal gait and no increase in pain for improved community navigation Baseline: unable 04/26/2021 INITIAL    PLAN: PT FREQUENCY: 2x/week   PT DURATION: 12 weeks   PLANNED INTERVENTIONS: Therapeutic exercises, Therapeutic activity, Neuro Muscular re-education, Balance training, Gait training, Patient/Family education, Joint mobilization, Aquatic Therapy, Dry Needling, Cryotherapy, Moist heat, Vasopneumatic device, and Manual therapy   PLAN FOR NEXT SESSION: manual to reduce medial knee pain during flexion , assess HEP response, PROM to R knee, progress as able      Hessie Diener, PTA 02/16/21 10:59 AM Phone: [phone] Fax: [phone]

## 2021-02-18 ENCOUNTER — Other Ambulatory Visit: Payer: Self-pay | Admitting: Orthopaedic Surgery

## 2021-02-18 ENCOUNTER — Ambulatory Visit: Payer: 59 | Admitting: Physical Therapy

## 2021-02-18 MED ORDER — OXYCODONE HCL 5 MG PO TABS: 5.0000 mg | ORAL_TABLET | Freq: Four times a day (QID) | ORAL | 0 refills | Status: DC | PRN

## 2021-02-23 ENCOUNTER — Ambulatory Visit: Payer: 59

## 2021-02-23 ENCOUNTER — Other Ambulatory Visit: Payer: Self-pay

## 2021-02-23 DIAGNOSIS — M25661 Stiffness of right knee, not elsewhere classified: Secondary | ICD-10-CM

## 2021-02-23 DIAGNOSIS — M6281 Muscle weakness (generalized): Secondary | ICD-10-CM

## 2021-02-23 DIAGNOSIS — R262 Difficulty in walking, not elsewhere classified: Secondary | ICD-10-CM

## 2021-02-23 NOTE — Therapy (Signed)
OUTPATIENT PHYSICAL THERAPY TREATMENT NOTE   Patient Name: Kayla Larsen MRN: [id number] DOB:1970/12/25, 51 y.o., female Today's Date: 02/23/2021  PCP: Carlena Hurl, PA-C REFERRING PROVIDER: [name]*   PT End of Session - 02/23/21 1042     Visit Number 5    Number of Visits 21    Date for PT Re-Evaluation 04/26/21    Authorization Type [employer]    PT Start Time 1042    PT Stop Time 1123    PT Time Calculation (min) 41 min    Activity Tolerance Patient tolerated treatment well    Behavior During Therapy [facility] for tasks assessed/performed              Past Medical History:  Diagnosis Date   Acanthosis nigricans    Anemia    Arthritis of knee    External hemorrhoid    History of blood transfusion 2009   2 units after hysterectomy   Hypertension    Obesity    Plantar fasciitis of left foot    Sleep apnea 2010   borderline no cpap used   Wears glasses    Past Surgical History:  Procedure Laterality Date   CESAREAN SECTION     x 2   COLONOSCOPY  2009   done for anemia eval; Dr. Benson [place]   COLONOSCOPY  01/25/2018   multiple aphthous ulcers in terminal ileum likely d/t NSAIDs, diverticulosis in sigmoid colon, Dr. [place] Cellar   KNEE SURGERY  10/2015   knot removed from R knee   LAPAROTOMY Left 07/30/2012   Procedure: LEFT SALPINGO OOPHERECTOMY,  OMENTECTOMY, AND LYSIS OF ADHESIONS;  Surgeon: Alvino Chapel, MD;  Location: [facility];  Service: Gynecology;  Laterality: Left;   OOPHORECTOMY  2014   left    TOTAL ABDOMINAL HYSTERECTOMY  2009   TOTAL KNEE ARTHROPLASTY Right 01/14/2021   Procedure: Right TOTAL KNEE ARTHROPLASTY;  Surgeon: [name], MD;  Location: [facility];  Service: Orthopedics;  Laterality: Right;  RNFA   Patient Active Problem List   Diagnosis Date Noted   Status post total right knee replacement 01/16/2021   Status post right knee replacement 01/14/2021   Encounter for health maintenance examination in adult  12/27/2020   Screening for heart disease 12/27/2020   History of gout 12/27/2020   Iron deficiency 12/27/2020   Screening for diabetes mellitus 12/27/2020   BMI 38.0-38.9,adult 12/27/2020   OAB (overactive bladder) 12/27/2020   Hypokalemia 12/27/2020   Primary osteoarthritis of right knee 11/16/2020   H/O: hysterectomy 09/21/2020   Vaccine counseling 11/20/2019   Encounter for screening mammogram for malignant neoplasm of breast 11/20/2019   Gout of foot 11/20/2019   Encounter for hepatitis C screening test for low risk patient 11/20/2019   Primary osteoarthritis of both knees 05/14/2019   Diverticulosis 11/19/2018   Vitamin D deficiency 11/24/2016   S/P right knee arthroscopy 12/13/2015   Obesity with serious comorbidity 05/03/2015   Overactive bladder 05/03/2015   Essential hypertension 05/03/2015   Routine general medical examination at a health care facility 05/03/2015   Arthritis, senescent 05/03/2015   Hypertriglyceridemia 05/03/2015   Impaired fasting blood sugar 05/03/2015   PCP: Carlena Hurl, PA-C   REFERRING PROVIDER: [name]*   REFERRING DIAG: E17.408 (ICD-10-CM) - Status post total right knee replacement    THERAPY DIAG:  Muscle weakness (generalized)  Difficulty in walking, not elsewhere classified  Stiffness of right knee, not elsewhere classified  PERTINENT HISTORY: Pt is a 50  y/o F s/p R TKA performed by Dr. Ninfa Linden on 01/14/2021   ONSET DATE: DOS: 01/14/2021  PRECAUTIONS: None  SUBJECTIVE: Pt presents to PT with reports of continued R knee pain post surgery, but notes increased pain in L knee today as well. She is having a cortisone injection tomorrow in her L knee when she sees Dr. Ninfa Linden. Pt is ready to begin PT at this time.   Pain: Are you having pain? Yes NPRS: 7/10 R knee; 8/10 L knee Pain location: R medial knee PAIN TYPE: sharp and tight Pain description: intermittent  Aggravating factors: touch, bending  forward Relieving factors: ice, compression   OBJECTIVE:    PATIENT SURVEYS:  FOTO 59% function; 71% predicted    COGNITION:          Overall cognitive status: Within functional limits for tasks assessed                        SENSATION:          Light touch: Appears intact   POSTURE:  Medium body habitus, L knee varus, increased swelling noted in R knee   PALPATION: TTP to R medial knee   LE AROM/PROM:   A/PROM Right 02/01/2021 Right 02/09/21 Right 02/16/21 Right 02/23/2021  Knee flexion 60 73 AAROM 82 AAROM 90 AAROM  Knee extension 15   5   (Blank rows = not tested)     LOWER EXTREMITY SPECIAL TESTS:  N/A   FUNCTIONAL TESTS:  30"STS: 8 reps with UE assist   GAIT: Distance walked: 59ft Assistive device utilized: Environmental consultant - 2 wheeled Level of assistance: Modified independence Comments: antalgic gait, decreased R knee flexion       TODAY'S TREATMENT: [facility] Adult PT Treatment:                                                DATE: 02/23/21 Therapeutic Exercise: Nustep L5 UE/LE x 5 min while taking subjective Step stretch for right knee flexion 10 sec x 10 6in step Standing hamstring curl x 15 R Heel-Toe raises x 15 Slant board stretch 2x30"  Seated heel slide 2x10 - 10" hold R LAQ R 2x10 3# Long sitting right hamstring stretch 2x30" Supine quad set x 15 - 5" R SLR 2x10 R Supine heel slide w/ strap x 15 - 10" R Manual Therapy: AP mobs to R knee in slightly flexed position   [facility] Adult PT Treatment:                                                DATE: 02/16/21 Therapeutic Exercise: Nustep L5 for ROM x 5 minutes , UE/LE [employer]: with RW and exaggerated knee/hip flexion to heel strike 150 feet Step stretch for right knee flexion 10 sec x 10 Heel raises x 15 Standing gastroc stretch right 30 sec  Seated heel slide with cloth SAQ  x 20 Long sitting right hamstring stretch 30 sec x 2 Supine quad set x 15 - 5" R-  SLR x 15 Supine heel slide w/  strap x 15- 10" R  [facility] Adult PT Treatment:  DATE: 02/11/21 Therapeutic Exercise: Nustep L5 for ROM x 5 minutes , UE/LE [employer]: with RW and exaggerated knee/hip flexion to heel strike 150 feet Gait up and down stairs, bilateral hand rails , step to pattern on descend Step stretch for right knee flexion 10 sec x 10 Heel raises x 15 Weight shifting to RLE at RW 10 sec x 5 Scoot stretch seated EOM 3 x 15 sec  Long sitting right hamstring stretch 30 sec x 2 Supine quad set x 15 - 5" R-  SLR x 15 Supine heel slide w/ strap x 15- 10" R Hip flexor stretch passive off edge of mat  Manual Therapy: Patella mobs all planes , passive knee flexion, passive hamstring stretch    PATIENT EDUCATION:  Education details: continue HEP Person educated: Patient Education method: Consulting civil engineer, Demonstration, and Handouts Education comprehension: verbalized understanding and returned demonstration     HOME EXERCISE PROGRAM: Access Code: [email or web address]   ASSESSMENT: Pt was able to complete prescribed exercises with no adverse effect. Does continue to demonstrate decreased R knee flexion/extension post surgery, but both have improved each session. She responded well to manual therapy interventions, with improved range noted.post. Pt continues to benefit from skilled PT services and should continue to be seen and progressed as tolerated.    GOALS: Goals reviewed with patient? No   SHORT TERM GOALS:   STG Name Target Date Goal status  1 Pt will be compliant and knowledgeable with initial HEP for improved comfort and carryover Baseline: initial HEP given 02/22/2021 INITIAL  2 Pt will self report R knee pain no greater than 6/10 for improved comfort and functional ability Baseline: 8/10 at worst 02/22/2021 INITIAL    LONG TERM GOALS:    LTG Name Target Date Goal status  1 Pt will improve FOTO function score to no less than 71% as proxy for  functional improvement Baseline: 59% function 04/26/2021 INITIAL  2 Pt will improve R knee AROM to no less than 3-110 degrees for improved functional mobility Baseline: 15-60 degrees 04/26/2021 INITIAL  3 Pt will self report R knee pain no greater than 3/10 for improved comfort and functional ability Baseline: 8/10 at worst 04/26/2021 INITIAL  4 Pt will increase reps in 30" STS to no less than 10 without use of UE for improved balance and functional mobility Baseline: 04/26/2021 INITIAL  5 Pt will be able to amb up/down 10 stairs with reciprocal gait and no increase in pain for improved community navigation Baseline: unable 04/26/2021 INITIAL    PLAN: PT FREQUENCY: 2x/week   PT DURATION: 12 weeks   PLANNED INTERVENTIONS: Therapeutic exercises, Therapeutic activity, Neuro Muscular re-education, Balance training, Gait training, Patient/Family education, Joint mobilization, Aquatic Therapy, Dry Needling, Cryotherapy, Moist heat, Vasopneumatic device, and Manual therapy   PLAN FOR NEXT SESSION: manual to reduce medial knee pain during flexion , assess HEP response, PROM to R knee, progress as able      Ward Chatters, PT 02/23/21 11:25 AM

## 2021-02-24 ENCOUNTER — Telehealth: Payer: Self-pay | Admitting: Orthopaedic Surgery

## 2021-02-24 ENCOUNTER — Encounter: Payer: Self-pay | Admitting: Orthopaedic Surgery

## 2021-02-24 ENCOUNTER — Ambulatory Visit (INDEPENDENT_AMBULATORY_CARE_PROVIDER_SITE_OTHER): Payer: 59 | Admitting: Orthopaedic Surgery

## 2021-02-24 DIAGNOSIS — M25562 Pain in left knee: Secondary | ICD-10-CM

## 2021-02-24 DIAGNOSIS — G8929 Other chronic pain: Secondary | ICD-10-CM | POA: Diagnosis not present

## 2021-02-24 DIAGNOSIS — Z96651 Presence of right artificial knee joint: Secondary | ICD-10-CM

## 2021-02-24 MED ORDER — LIDOCAINE HCL 1 % IJ SOLN
3.0000 mL | INTRAMUSCULAR | Status: AC | PRN
  Administered 2021-02-24: 3 mL

## 2021-02-24 MED ORDER — ZOLPIDEM TARTRATE ER 12.5 MG PO TBCR: 12.5000 mg | EXTENDED_RELEASE_TABLET | Freq: Every evening | ORAL | 0 refills | Status: DC | PRN

## 2021-02-24 MED ORDER — METHYLPREDNISOLONE ACETATE 40 MG/ML IJ SUSP
40.0000 mg | INTRAMUSCULAR | Status: AC | PRN
  Administered 2021-02-24: 40 mg via INTRA_ARTICULAR

## 2021-02-24 NOTE — Progress Notes (Signed)
Office Visit Note   Patient: Kayla Larsen           Date of Birth: [birth year]           MRN: [id number] Visit Date: 02/24/2021              Requested by: Carlena Hurl, PA-C [id number] [place],  [place] [id number] PCP: Carlena Hurl, PA-C   Assessment & Plan: Visit Diagnoses:  1. Status post total right knee replacement   2. Chronic pain of left knee     Plan: Per her request I did provide a steroid injection in her left knee today which she tolerated well.  I want her to push herself hard to physical therapy these next 2 weeks.  I would like to see her back in 2 weeks for repeat exam on the right knee.  If she is not getting beyond 90 degrees we would need to set her up for manipulation under anesthesia of the right knee and she understands this as well.  Follow-Up Instructions: Return in about 2 weeks (around 03/10/2021).   Orders:  Orders Placed This Encounter  Procedures   Large Joint Inj   No orders of the defined types were placed in this encounter.     Procedures: Large Joint Inj: L knee on 02/24/2021 3:26 PM Indications: diagnostic evaluation and pain Details: 22 G 1.5 in needle, superolateral approach  Arthrogram: No  Medications: 3 mL lidocaine 1 %; 40 mg methylPREDNISolone acetate 40 MG/ML Outcome: tolerated well, no immediate complications Procedure, treatment alternatives, risks and benefits explained, specific risks discussed. Consent was given by the patient. Immediately prior to procedure a time out was called to verify the correct patient, procedure, equipment, support staff and site/side marked as required. Patient was prepped and draped in the usual sterile fashion.      Clinical Data: No additional findings.   Subjective: Chief Complaint  Patient presents with   Right Knee - Follow-up  The patient is now about 6 weeks status post a right total knee arthroplasty.  She does have severe arthritis in her left knee and is requesting a  steroid injection in that knee today.  She is ambulate with a rolling walker.  She is doing well overall but states that her right knee is only flex to about 90 degrees.  She only had 2 home health visits so she started slow.  She feels like her therapists are doing a good job with her in terms of her outpatient physical therapy and they are encouraged about her potential progress.  HPI  Review of Systems   Objective: Vital Signs: There were no vitals taken for this visit.  Physical Exam She is alert and orient x3 and in no acute distress Ortho Exam Examination of her right operative knee shows that she has almost full extension but I can only flex her to about 90 degrees and barely beyond that.  Her left knee definitely has an arthritic knee with global tenderness and patellofemoral crepitation with varus malalignment. Specialty Comments:  No specialty comments available.  Imaging: No results found.   PMFS History: Patient Active Problem List   Diagnosis Date Noted   Status post total right knee replacement 01/16/2021   Status post right knee replacement 01/14/2021   Encounter for health maintenance examination in adult 12/27/2020   Screening for heart disease 12/27/2020   History of gout 12/27/2020   Iron deficiency 12/27/2020   Screening for diabetes mellitus 12/27/2020  BMI 38.0-38.9,adult 12/27/2020   OAB (overactive bladder) 12/27/2020   Hypokalemia 12/27/2020   Primary osteoarthritis of right knee 11/16/2020   H/O: hysterectomy 09/21/2020   Vaccine counseling 11/20/2019   Encounter for screening mammogram for malignant neoplasm of breast 11/20/2019   Gout of foot 11/20/2019   Encounter for hepatitis C screening test for low risk patient 11/20/2019   Primary osteoarthritis of both knees 05/14/2019   Diverticulosis 11/19/2018   Vitamin D deficiency 11/24/2016   S/P right knee arthroscopy 12/13/2015   Obesity with serious comorbidity 05/03/2015   Overactive bladder  05/03/2015   Essential hypertension 05/03/2015   Routine general medical examination at a health care facility 05/03/2015   Arthritis, senescent 05/03/2015   Hypertriglyceridemia 05/03/2015   Impaired fasting blood sugar 05/03/2015   Past Medical History:  Diagnosis Date   Acanthosis nigricans    Anemia    Arthritis of knee    External hemorrhoid    History of blood transfusion 2009   2 units after hysterectomy   Hypertension    Obesity    Plantar fasciitis of left foot    Sleep apnea 2010   borderline no cpap used   Wears glasses     Family History  Problem Relation Age of Onset   Cancer Mother        liver and lung   Hypertension Mother    Diabetes Father    Kidney failure Father    Hypertension Brother    Hypertension Brother    Hypertension Brother    Cancer Maternal Aunt        thyroid   Diabetes Maternal Aunt    Hypertension Maternal Aunt    Heart disease Maternal Grandmother    Diabetes Maternal Uncle    Hypertension Maternal Uncle    Stroke Neg Hx     Past Surgical History:  Procedure Laterality Date   CESAREAN SECTION     x 2   COLONOSCOPY  2009   done for anemia eval; Dr. Benson Norway   COLONOSCOPY  01/25/2018   multiple aphthous ulcers in terminal ileum likely d/t NSAIDs, diverticulosis in sigmoid colon, Dr. [place] Cellar   KNEE SURGERY  10/2015   knot removed from R knee   LAPAROTOMY Left 07/30/2012   Procedure: LEFT SALPINGO OOPHERECTOMY,  OMENTECTOMY, AND LYSIS OF ADHESIONS;  Surgeon: Alvino Chapel, MD;  Location: [facility];  Service: Gynecology;  Laterality: Left;   OOPHORECTOMY  2014   left    TOTAL ABDOMINAL HYSTERECTOMY  2009   TOTAL KNEE ARTHROPLASTY Right 01/14/2021   Procedure: Right TOTAL KNEE ARTHROPLASTY;  Surgeon: [name], MD;  Location: [facility];  Service: Orthopedics;  Laterality: Right;  RNFA   Social History   Occupational History   Not on file  Tobacco Use   Smoking status: Former    Packs/day: 0.25     Years: 4.00    Pack years: 1.00    Types: Cigarettes    Quit date: 09/29/2011    Years since quitting: 9.4   Smokeless tobacco: Never  Vaping Use   Vaping Use: Never used  Substance and Sexual Activity   Alcohol use: No    Alcohol/week: 2.0 standard drinks    Types: 2 Shots of liquor per week   Drug use: No   Sexual activity: Not Currently

## 2021-02-24 NOTE — Telephone Encounter (Signed)
Pt would like some Ambien to help her sleep.   [phone]

## 2021-02-25 ENCOUNTER — Other Ambulatory Visit: Payer: Self-pay | Admitting: Orthopaedic Surgery

## 2021-02-25 ENCOUNTER — Ambulatory Visit: Payer: 59 | Admitting: Physical Therapy

## 2021-02-25 ENCOUNTER — Other Ambulatory Visit: Payer: Self-pay

## 2021-02-25 ENCOUNTER — Encounter: Payer: Self-pay | Admitting: Physical Therapy

## 2021-02-25 DIAGNOSIS — M25661 Stiffness of right knee, not elsewhere classified: Secondary | ICD-10-CM

## 2021-02-25 DIAGNOSIS — R262 Difficulty in walking, not elsewhere classified: Secondary | ICD-10-CM

## 2021-02-25 DIAGNOSIS — M6281 Muscle weakness (generalized): Secondary | ICD-10-CM | POA: Diagnosis not present

## 2021-02-25 MED ORDER — OXYCODONE HCL 5 MG PO TABS: 5.0000 mg | ORAL_TABLET | Freq: Four times a day (QID) | ORAL | 0 refills | Status: DC | PRN

## 2021-02-25 MED ORDER — TIZANIDINE HCL 4 MG PO TABS: 4.0000 mg | ORAL_TABLET | Freq: Three times a day (TID) | ORAL | 0 refills | Status: DC | PRN

## 2021-02-25 NOTE — Therapy (Signed)
OUTPATIENT PHYSICAL THERAPY TREATMENT NOTE   Patient Name: Kayla Larsen MRN: [id number] DOB:03/07/1970, 51 y.o., female Today's Date: 02/25/2021  PCP: Carlena Hurl, PA-C REFERRING PROVIDER: [name]*   Kayla Larsen End of Session - 02/25/21 1025     Visit Number 6    Number of Visits 21    Date for Kayla Larsen Re-Evaluation 04/26/21    Authorization Type [employer]    Kayla Larsen Start Time 1020    Kayla Larsen Stop Time 1110    Kayla Larsen Time Calculation (min) 50 min              Past Medical History:  Diagnosis Date   Acanthosis nigricans    Anemia    Arthritis of knee    External hemorrhoid    History of blood transfusion 2009   2 units after hysterectomy   Hypertension    Obesity    Plantar fasciitis of left foot    Sleep apnea 2010   borderline no cpap used   Wears glasses    Past Surgical History:  Procedure Laterality Date   CESAREAN SECTION     x 2   COLONOSCOPY  2009   done for anemia eval; Dr. Benson [place]   COLONOSCOPY  01/25/2018   multiple aphthous ulcers in terminal ileum likely d/t NSAIDs, diverticulosis in sigmoid colon, Dr. [place] Cellar   KNEE SURGERY  10/2015   knot removed from R knee   LAPAROTOMY Left 07/30/2012   Procedure: LEFT SALPINGO OOPHERECTOMY,  OMENTECTOMY, AND LYSIS OF ADHESIONS;  Surgeon: Alvino Chapel, MD;  Location: [facility];  Service: Gynecology;  Laterality: Left;   OOPHORECTOMY  2014   left    TOTAL ABDOMINAL HYSTERECTOMY  2009   TOTAL KNEE ARTHROPLASTY Right 01/14/2021   Procedure: Right TOTAL KNEE ARTHROPLASTY;  Surgeon: [name], MD;  Location: [facility];  Service: Orthopedics;  Laterality: Right;  RNFA   Patient Active Problem List   Diagnosis Date Noted   Status post total right knee replacement 01/16/2021   Status post right knee replacement 01/14/2021   Encounter for health maintenance examination in adult 12/27/2020   Screening for heart disease 12/27/2020   History of gout 12/27/2020   Iron deficiency 12/27/2020    Screening for diabetes mellitus 12/27/2020   BMI 38.0-38.9,adult 12/27/2020   OAB (overactive bladder) 12/27/2020   Hypokalemia 12/27/2020   Primary osteoarthritis of right knee 11/16/2020   H/O: hysterectomy 09/21/2020   Vaccine counseling 11/20/2019   Encounter for screening mammogram for malignant neoplasm of breast 11/20/2019   Gout of foot 11/20/2019   Encounter for hepatitis C screening test for low risk patient 11/20/2019   Primary osteoarthritis of both knees 05/14/2019   Diverticulosis 11/19/2018   Vitamin D deficiency 11/24/2016   S/P right knee arthroscopy 12/13/2015   Obesity with serious comorbidity 05/03/2015   Overactive bladder 05/03/2015   Essential hypertension 05/03/2015   Routine general medical examination at a health care facility 05/03/2015   Arthritis, senescent 05/03/2015   Hypertriglyceridemia 05/03/2015   Impaired fasting blood sugar 05/03/2015   PCP: Carlena Hurl, PA-C   REFERRING PROVIDER: [name]*   REFERRING DIAG: W09.811 (ICD-10-CM) - Status post total right knee replacement    THERAPY DIAG:  Muscle weakness (generalized)  Difficulty in walking, not elsewhere classified  Stiffness of right knee, not elsewhere classified  PERTINENT HISTORY: Kayla Larsen is a 51 y/o F s/p R TKA performed by Dr. Ninfa Linden on 01/14/2021   ONSET DATE: DOS: 01/14/2021  PRECAUTIONS:  None  SUBJECTIVE: I saw the Surgeon who said 2 more weeks of Kayla Larsen and if I am not better he will manipulate the knee. My knee feels like the scar will rip open when I am stretching.   Pain: Are you having pain? Yes NPRS: 4/10 R knee; 8/10 L knee Pain location: R medial knee PAIN TYPE: sharp and tight Pain description: intermittent  Aggravating factors: touch, bending forward Relieving factors: ice, compression   OBJECTIVE:    PATIENT SURVEYS:  FOTO 59% function; 71% predicted    COGNITION:          Overall cognitive status: Within functional limits for tasks  assessed                        SENSATION:          Light touch: Appears intact   POSTURE:  Medium body habitus, L knee varus, increased swelling noted in R knee   PALPATION: TTP to R medial knee   LE AROM/PROM:   A/PROM Right 02/01/2021 Right 02/09/21 Right 02/16/21 Right 02/23/2021 Right  02/25/21  Knee flexion 60 73 AAROM 82 AAROM 90 AAROM 94 AAROM  Knee extension 15   5    (Blank rows = not tested)     LOWER EXTREMITY SPECIAL TESTS:  N/A   FUNCTIONAL TESTS:  30"STS: 8 reps with UE assist   GAIT: Distance walked: 31ft Assistive device utilized: Environmental consultant - 2 wheeled Level of assistance: Modified independence Comments: antalgic gait, decreased R knee flexion       TODAY'S TREATMENT: [facility] Adult Kayla Larsen Treatment:                                                DATE: 02/25/21 Therapeutic Exercise: Nustep L5 UE/LE x 5 min while taking subjective Rec bike partial revolutions x 3 minutes Step stretch for right knee flexion 10 sec x 10 6in step Manual performed at this time Hip flexor stretch with strap EOM 3 x 30 sec  Heel slides with strap x 10 for 10 sec  PROM 94 knee flexion Hamstring stretch with strap  Manual Therapy: AP mobs to R knee in slightly flexed position , Scar massage , massage roller to thigh   [facility] Adult Kayla Larsen Treatment:                                                DATE: 02/23/21 Therapeutic Exercise: Nustep L5 UE/LE x 5 min while taking subjective Step stretch for right knee flexion 10 sec x 10 6in step Standing hamstring curl x 15 R Heel-Toe raises x 15 Slant board stretch 2x30"  Seated heel slide 2x10 - 10" hold R LAQ R 2x10 3# Long sitting right hamstring stretch 2x30" Supine quad set x 15 - 5" R SLR 2x10 R Supine heel slide w/ strap x 15 - 10" R Manual Therapy: AP mobs to R knee in slightly flexed position  Modalities: ice pack 10 minutes right knee  [facility] Adult Kayla Larsen Treatment:  DATE: 02/16/21 Therapeutic  Exercise: Nustep L5 for ROM x 5 minutes , UE/LE [employer]: with RW and exaggerated knee/hip flexion to heel strike 150 feet Step stretch for right knee flexion 10 sec x 10 Heel raises x 15 Standing gastroc stretch right 30 sec  Seated heel slide with cloth SAQ  x 20 Long sitting right hamstring stretch 30 sec x 2 Supine quad set x 15 - 5" R-  SLR x 15 Supine heel slide w/ strap x 15- 10" R  [facility] Adult Kayla Larsen Treatment:                                                DATE: 02/11/21 Therapeutic Exercise: Nustep L5 for ROM x 5 minutes , UE/LE [employer]: with RW and exaggerated knee/hip flexion to heel strike 150 feet Gait up and down stairs, bilateral hand rails , step to pattern on descend Step stretch for right knee flexion 10 sec x 10 Heel raises x 15 Weight shifting to RLE at RW 10 sec x 5 Scoot stretch seated EOM 3 x 15 sec  Long sitting right hamstring stretch 30 sec x 2 Supine quad set x 15 - 5" R-  SLR x 15 Supine heel slide w/ strap x 15- 10" R Hip flexor stretch passive off edge of mat  Manual Therapy: [place] mobs all planes , passive knee flexion, passive hamstring stretch,    PATIENT EDUCATION:  Education details: continue HEP Person educated: Patient Education method: Explanation, Demonstration, and Handouts Education comprehension: verbalized understanding and returned demonstration     HOME EXERCISE PROGRAM: Access Code: [email or web address]   ASSESSMENT: Session focused on manual for scar mobilization, joint mobs and stretching. Kayla Larsen was given 2 weeks by MD to improved ROM or manipulation will be scheduled. Able to improved to 94 AAROM at end of session. Kayla Larsen continues to benefit from skilled Kayla Larsen services and should continue to be seen and progressed as tolerated.    GOALS: Goals reviewed with patient? No   SHORT TERM GOALS:   STG Name Target Date Goal status  1 Kayla Larsen will be compliant and knowledgeable with initial HEP for improved comfort  and carryover Baseline: initial HEP given 02/22/2021 INITIAL  2 Kayla Larsen will self report R knee pain no greater than 6/10 for improved comfort and functional ability Baseline: 8/10 at worst 02/22/2021 INITIAL    LONG TERM GOALS:    LTG Name Target Date Goal status  1 Kayla Larsen will improve FOTO function score to no less than 71% as proxy for functional improvement Baseline: 59% function 04/26/2021 INITIAL  2 Kayla Larsen will improve R knee AROM to no less than 3-110 degrees for improved functional mobility Baseline: 15-60 degrees 04/26/2021 INITIAL  3 Kayla Larsen will self report R knee pain no greater than 3/10 for improved comfort and functional ability Baseline: 8/10 at worst 04/26/2021 INITIAL  4 Kayla Larsen will increase reps in 30" STS to no less than 10 without use of UE for improved balance and functional mobility Baseline: 04/26/2021 INITIAL  5 Kayla Larsen will be able to amb up/down 10 stairs with reciprocal gait and no increase in pain for improved community navigation Baseline: unable 04/26/2021 INITIAL    PLAN: Kayla Larsen FREQUENCY: 2x/week   Kayla Larsen DURATION: 12 weeks   PLANNED INTERVENTIONS: Therapeutic exercises, Therapeutic activity, Neuro Muscular re-education, Balance training, Gait  training, Patient/Family education, Joint mobilization, Aquatic Therapy, Dry Needling, Cryotherapy, Moist heat, Vasopneumatic device, and Manual therapy   PLAN FOR NEXT SESSION: manual to reduce medial knee pain during flexion , assess HEP response, PROM to R knee, progress as able, continue scar massage and STW       Hessie Diener, PTA 02/25/21 11:41 AM Phone: [phone] Fax: [phone]

## 2021-03-01 ENCOUNTER — Other Ambulatory Visit: Payer: Self-pay | Admitting: Medical

## 2021-03-02 ENCOUNTER — Ambulatory Visit: Payer: 59

## 2021-03-02 ENCOUNTER — Other Ambulatory Visit: Payer: Self-pay

## 2021-03-02 DIAGNOSIS — R262 Difficulty in walking, not elsewhere classified: Secondary | ICD-10-CM

## 2021-03-02 DIAGNOSIS — M6281 Muscle weakness (generalized): Secondary | ICD-10-CM

## 2021-03-02 NOTE — Therapy (Signed)
OUTPATIENT PHYSICAL THERAPY TREATMENT NOTE   Patient Name: ADELLA MANOLIS MRN: [id number] DOB:[birth year], 51 y.o., female Today's Date: 03/02/2021  PCP: Carlena Hurl, PA-C REFERRING PROVIDER: [name]*   PT End of Session - 03/02/21 1056     Visit Number 7    Number of Visits 21    Date for PT Re-Evaluation 04/26/21    Authorization Type [employer]    PT Start Time 1053   arrived late   PT Stop Time 1128    PT Time Calculation (min) 35 min    Activity Tolerance Patient tolerated treatment well               Past Medical History:  Diagnosis Date   Acanthosis nigricans    Anemia    Arthritis of knee    External hemorrhoid    History of blood transfusion 2009   2 units after hysterectomy   Hypertension    Obesity    Plantar fasciitis of left foot    Sleep apnea 2010   borderline no cpap used   Wears glasses    Past Surgical History:  Procedure Laterality Date   CESAREAN SECTION     x 2   COLONOSCOPY  2009   done for anemia eval; Dr. Benson [place]   COLONOSCOPY  01/25/2018   multiple aphthous ulcers in terminal ileum likely d/t NSAIDs, diverticulosis in sigmoid colon, Dr. [place] Cellar   KNEE SURGERY  10/2015   knot removed from R knee   LAPAROTOMY Left 07/30/2012   Procedure: LEFT SALPINGO OOPHERECTOMY,  OMENTECTOMY, AND LYSIS OF ADHESIONS;  Surgeon: Alvino Chapel, MD;  Location: [facility];  Service: Gynecology;  Laterality: Left;   OOPHORECTOMY  2014   left    TOTAL ABDOMINAL HYSTERECTOMY  2009   TOTAL KNEE ARTHROPLASTY Right 01/14/2021   Procedure: Right TOTAL KNEE ARTHROPLASTY;  Surgeon: [name], MD;  Location: [facility];  Service: Orthopedics;  Laterality: Right;  RNFA   Patient Active Problem List   Diagnosis Date Noted   Status post total right knee replacement 01/16/2021   Status post right knee replacement 01/14/2021   Encounter for health maintenance examination in adult 12/27/2020   Screening for heart disease  12/27/2020   History of gout 12/27/2020   Iron deficiency 12/27/2020   Screening for diabetes mellitus 12/27/2020   BMI 38.0-38.9,adult 12/27/2020   OAB (overactive bladder) 12/27/2020   Hypokalemia 12/27/2020   Primary osteoarthritis of right knee 11/16/2020   H/O: hysterectomy 09/21/2020   Vaccine counseling 11/20/2019   Encounter for screening mammogram for malignant neoplasm of breast 11/20/2019   Gout of foot 11/20/2019   Encounter for hepatitis C screening test for low risk patient 11/20/2019   Primary osteoarthritis of both knees 05/14/2019   Diverticulosis 11/19/2018   Vitamin D deficiency 11/24/2016   S/P right knee arthroscopy 12/13/2015   Obesity with serious comorbidity 05/03/2015   Overactive bladder 05/03/2015   Essential hypertension 05/03/2015   Routine general medical examination at a health care facility 05/03/2015   Arthritis, senescent 05/03/2015   Hypertriglyceridemia 05/03/2015   Impaired fasting blood sugar 05/03/2015   PCP: Carlena Hurl, PA-C   REFERRING PROVIDER: [name]*   REFERRING DIAG: U38.453 (ICD-10-CM) - Status post total right knee replacement    THERAPY DIAG:  Muscle weakness (generalized)  Difficulty in walking, not elsewhere classified  PERTINENT HISTORY: Pt is a 51 y/o F s/p R TKA performed by Dr. Ninfa Linden on 01/14/2021   ONSET  DATE: DOS: 01/14/2021  PRECAUTIONS: None  SUBJECTIVE: Pt presents to PT with reports of continued bilateral knee pain, L>R. Injection into L knee did not seem to help last week. Pt is ready to begin PT at this time.   Pain: Are you having pain? Yes NPRS: 4/10 R knee; 8/10 L knee Pain location: R medial knee PAIN TYPE: sharp and tight Pain description: intermittent  Aggravating factors: touch, bending forward Relieving factors: ice, compression   OBJECTIVE:    PATIENT SURVEYS:  FOTO 59% function; 71% predicted    COGNITION:          Overall cognitive status: Within functional  limits for tasks assessed                        SENSATION:          Light touch: Appears intact   POSTURE:  Medium body habitus, L knee varus, increased swelling noted in R knee   PALPATION: TTP to R medial knee   LE AROM/PROM:   A/PROM Right 02/01/2021 Right 02/09/21 Right 02/16/21 Right 02/23/2021 Right  02/25/21  Knee flexion 60 73 AAROM 82 AAROM 90 AAROM 94 AAROM  Knee extension 15   5    (Blank rows = not tested)     LOWER EXTREMITY SPECIAL TESTS:  N/A   FUNCTIONAL TESTS:  30"STS: 8 reps with UE assist   GAIT: Distance walked: 43ft Assistive device utilized: Environmental consultant - 2 wheeled Level of assistance: Modified independence Comments: antalgic gait, decreased R knee flexion       TODAY'S TREATMENT: [facility] Adult PT Treatment:                                                DATE: 03/02/21 Therapeutic Exercise: Nustep L5 UE/LE x 3 min while taking subjective Rec bike partial revolutions x 3 minutes Step stretch for right knee flexion 10 sec x 10 6in step Heel slides with strap x 10 for 10 sec  Heel slide on wall R x 10 - 5" hold Manual Therapy: Seated knee flexion stretch w/ manipulation belt and overpressure by PT AP mobs to R knee in slightly flexed position , Scar massage , massage roller to thigh  [facility] Adult PT Treatment:                                                DATE: 02/25/21 Therapeutic Exercise: Nustep L5 UE/LE x 5 min while taking subjective Rec bike partial revolutions x 3 minutes Step stretch for right knee flexion 10 sec x 10 6in step Manual performed at this time Hip flexor stretch with strap EOM 3 x 30 sec  Heel slides with strap x 10 for 10 sec  PROM 94 knee flexion Hamstring stretch with strap  Manual Therapy: AP mobs to R knee in slightly flexed position , Scar massage , massage roller to thigh   [facility] Adult PT Treatment:                                                DATE:  02/23/21 Therapeutic Exercise: Nustep L5 UE/LE x 5 min while taking  subjective Step stretch for right knee flexion 10 sec x 10 6in step Standing hamstring curl x 15 R Heel-Toe raises x 15 Slant board stretch 2x30"  Seated heel slide 2x10 - 10" hold R LAQ R 2x10 3# Long sitting right hamstring stretch 2x30" Supine quad set x 15 - 5" R SLR 2x10 R Supine heel slide w/ strap x 15 - 10" R Manual Therapy: AP mobs to R knee in slightly flexed position  Modalities: ice pack 10 minutes right knee   PATIENT EDUCATION:  Education details: continue HEP Person educated: Patient Education method: Explanation, Demonstration, and Handouts Education comprehension: verbalized understanding and returned demonstration     HOME EXERCISE PROGRAM: Access Code: [email or web address] URL: [email or web address] Date: 03/02/2021 Prepared by: Octavio Manns  Exercises Seated Heel Slide - 3-4 x daily - 7 x weekly - 2 sets - 10 reps - 10" hold Seated Quad Set - 3-4 x daily - 7 x weekly - 2 sets - 10 reps - 5" hold Seated Long Arc Quad - 3-4 x daily - 7 x weekly - 3 sets - 10 reps Supine Quadricep Sets - 3-4 x daily - 7 x weekly - 2 sets - 10 reps - 5" hold Supine Heel Slide with Strap - 5-7 x daily - 7 x weekly - 2 sets - 10 reps - 10" hold Supine Knee Flexion Wall Slide - 1 x daily - 7 x weekly - 10 reps - 5 hold    ASSESSMENT: Pt was able to complete prescribed exercises with no adverse effect. Therapy today focused on improving R knee flexion ROM for increased functional mobility. She was able to progress into slightly further range today, but continues to be limited post TKA. Will work towards decreasing [place] reliance next session. PT will also continue to progress exercises as tolerated per POC as prescribed.    GOALS: Goals reviewed with patient? No   SHORT TERM GOALS:   STG Name Target Date Goal status  1 Pt will be compliant and knowledgeable with initial HEP for improved comfort and carryover Baseline: initial HEP given 02/22/2021 INITIAL  2 Pt will self  report R knee pain no greater than 6/10 for improved comfort and functional ability Baseline: 8/10 at worst 02/22/2021 INITIAL    LONG TERM GOALS:    LTG Name Target Date Goal status  1 Pt will improve FOTO function score to no less than 71% as proxy for functional improvement Baseline: 59% function 04/26/2021 INITIAL  2 Pt will improve R knee AROM to no less than 3-110 degrees for improved functional mobility Baseline: 15-60 degrees 04/26/2021 INITIAL  3 Pt will self report R knee pain no greater than 3/10 for improved comfort and functional ability Baseline: 8/10 at worst 04/26/2021 INITIAL  4 Pt will increase reps in 30" STS to no less than 10 without use of UE for improved balance and functional mobility Baseline: 04/26/2021 INITIAL  5 Pt will be able to amb up/down 10 stairs with reciprocal gait and no increase in pain for improved community navigation Baseline: unable 04/26/2021 INITIAL    PLAN: PT FREQUENCY: 2x/week   PT DURATION: 12 weeks   PLANNED INTERVENTIONS: Therapeutic exercises, Therapeutic activity, Neuro Muscular re-education, Balance training, Gait training, Patient/Family education, Joint mobilization, Aquatic Therapy, Dry Needling, Cryotherapy, Moist heat, Vasopneumatic device, and Manual therapy   PLAN FOR NEXT SESSION: manual to reduce medial knee pain during flexion , assess HEP response,  PROM to R knee, progress as able, continue scar massage and STW      Ward Chatters, PT 03/02/21 11:30 AM

## 2021-03-04 ENCOUNTER — Ambulatory Visit: Payer: 59 | Admitting: Physical Therapy

## 2021-03-04 ENCOUNTER — Other Ambulatory Visit: Payer: Self-pay | Admitting: Orthopaedic Surgery

## 2021-03-04 ENCOUNTER — Encounter: Payer: Self-pay | Admitting: Physical Therapy

## 2021-03-04 ENCOUNTER — Other Ambulatory Visit: Payer: Self-pay

## 2021-03-04 DIAGNOSIS — M6281 Muscle weakness (generalized): Secondary | ICD-10-CM | POA: Diagnosis not present

## 2021-03-04 DIAGNOSIS — R262 Difficulty in walking, not elsewhere classified: Secondary | ICD-10-CM

## 2021-03-04 MED ORDER — HYDROCODONE-ACETAMINOPHEN 7.5-325 MG PO TABS: 1.0000 | ORAL_TABLET | Freq: Four times a day (QID) | ORAL | 0 refills | Status: DC | PRN

## 2021-03-04 NOTE — Therapy (Signed)
OUTPATIENT PHYSICAL THERAPY TREATMENT NOTE   Patient Name: ABBEE Larsen MRN: [id number] DOB:[birth year], 51 y.o., female Today's Date: 03/04/2021  PCP: Kayla Hurl, PA-C REFERRING PROVIDER: Carlena Hurl, PA-C   PT End of Session - 03/04/21 1056     Visit Number 8    Number of Visits 21    Date for PT Re-Evaluation 04/26/21    Authorization Type [employer]    PT Start Time 1020    PT Stop Time 1110    PT Time Calculation (min) 50 min                Past Medical History:  Diagnosis Date   Acanthosis nigricans    Anemia    Arthritis of knee    External hemorrhoid    History of blood transfusion 2009   2 units after hysterectomy   Hypertension    Obesity    Plantar fasciitis of left foot    Sleep apnea 2010   borderline no cpap used   Wears glasses    Past Surgical History:  Procedure Laterality Date   CESAREAN SECTION     x 2   COLONOSCOPY  2009   done for anemia eval; Dr. Benson Larsen   COLONOSCOPY  01/25/2018   multiple aphthous ulcers in terminal ileum likely d/t NSAIDs, diverticulosis in sigmoid colon, Dr. Sutton Larsen   KNEE SURGERY  10/2015   knot removed from R knee   LAPAROTOMY Left 07/30/2012   Procedure: LEFT SALPINGO OOPHERECTOMY,  OMENTECTOMY, AND LYSIS OF ADHESIONS;  Surgeon: Kayla Chapel, MD;  Location: [facility];  Service: Gynecology;  Laterality: Left;   OOPHORECTOMY  2014   left    TOTAL ABDOMINAL HYSTERECTOMY  2009   TOTAL KNEE ARTHROPLASTY Right 01/14/2021   Procedure: Right TOTAL KNEE ARTHROPLASTY;  Surgeon: Kayla Rossetti, MD;  Location: [facility];  Service: Orthopedics;  Laterality: Right;  RNFA   Patient Active Problem List   Diagnosis Date Noted   Status post total right knee replacement 01/16/2021   Status post right knee replacement 01/14/2021   Encounter for health maintenance examination in adult 12/27/2020   Screening for heart disease 12/27/2020   History of gout 12/27/2020   Iron deficiency 12/27/2020    Screening for diabetes mellitus 12/27/2020   BMI 38.0-38.9,adult 12/27/2020   OAB (overactive bladder) 12/27/2020   Hypokalemia 12/27/2020   Primary osteoarthritis of right knee 11/16/2020   H/O: hysterectomy 09/21/2020   Vaccine counseling 11/20/2019   Encounter for screening mammogram for malignant neoplasm of breast 11/20/2019   Gout of foot 11/20/2019   Encounter for hepatitis C screening test for low risk patient 11/20/2019   Primary osteoarthritis of both knees 05/14/2019   Diverticulosis 11/19/2018   Vitamin D deficiency 11/24/2016   S/P right knee arthroscopy 12/13/2015   Obesity with serious comorbidity 05/03/2015   Overactive bladder 05/03/2015   Essential hypertension 05/03/2015   Routine general medical examination at a health care facility 05/03/2015   Arthritis, senescent 05/03/2015   Hypertriglyceridemia 05/03/2015   Impaired fasting blood sugar 05/03/2015   PCP: Kayla Hurl, PA-C   REFERRING PROVIDER: Mcarthur Larsen*   REFERRING DIAG: Q65.784 (ICD-10-CM) - Status post total right knee replacement    THERAPY DIAG:  No diagnosis found.  PERTINENT HISTORY: Pt is a 51 y/o F s/p R TKA performed by Kayla Larsen on 01/14/2021   ONSET DATE: DOS: 01/14/2021  PRECAUTIONS: None  SUBJECTIVE: Pt reports left knee is buckling more since injection  so she is using the RW.   Pain: Are you having pain? Yes NPRS: 4-5/10 R knee; 9/10 L knee Pain location: R medial knee PAIN TYPE: sharp and tight Pain description: intermittent  Aggravating factors: touch, bending forward Relieving factors: ice, compression   OBJECTIVE:    PATIENT SURVEYS:  FOTO 59% function; 71% predicted    COGNITION:          Overall cognitive status: Within functional limits for tasks assessed                        SENSATION:          Light touch: Appears intact   POSTURE:  Medium body habitus, L knee varus, increased swelling noted in R knee   PALPATION: TTP to R medial  knee   LE AROM/PROM:   A/PROM Right 02/01/2021 Right 02/09/21 Right 02/16/21 Right 02/23/2021 Right  02/25/21 Right 03/04/21  Knee flexion 60 73 AAROM 82 AAROM 90 AAROM 94 AAROM 92 AAROm-supine and feet on wall   Knee extension 15   5     (Blank rows = not tested)     LOWER EXTREMITY SPECIAL TESTS:  N/A   FUNCTIONAL TESTS:  30"STS: 8 reps with UE assist   GAIT: Distance walked: 39ft Assistive device utilized: Environmental consultant - 2 wheeled Level of assistance: Modified independence Comments: antalgic gait, decreased R knee flexion       TODAY'S TREATMENT: [facility] Adult PT Treatment:                                                DATE: 03/04/21 Therapeutic Exercise: Nustep L6 UE/LE x 5 min while taking subjective Seated leg press 35# bil x 20, single leg 35# x 15  Rec bike partial to full revolutions - min hip hike x 3 min Knee ext 5# bilat with right eccentric Knee flex 20# bilat with right eccentric  Passive hip flexor stretch  Heel slides with strap x 10 for 10 sec  Heel slide on wall R x 10 - 5" hold Supine hamstring stretch with strap Manual Therapy:  AP mobs to R knee in  flexed position massage roller to thigh Modalities: ice pack 10 minutes right knee   [facility] Adult PT Treatment:                                                DATE: 03/02/21 Therapeutic Exercise: Nustep L5 UE/LE x 3 min while taking subjective Rec bike partial revolutions x 3 minutes Step stretch for right knee flexion 10 sec x 10 6in step Heel slides with strap x 10 for 10 sec  Heel slide on wall R x 10 - 5" hold Manual Therapy: Seated knee flexion stretch w/ manipulation belt and overpressure by PT AP mobs to R knee in slightly flexed position , Scar massage , massage roller to thigh  [facility] Adult PT Treatment:                                                DATE: 02/25/21 Therapeutic Exercise:  Nustep L5 UE/LE x 5 min while taking subjective Rec bike partial revolutions x 3 minutes Step stretch for right knee  flexion 10 sec x 10 6in step Manual performed at this time Hip flexor stretch with strap EOM 3 x 30 sec  Heel slides with strap x 10 for 10 sec  PROM 94 knee flexion Hamstring stretch with strap  Manual Therapy: AP mobs to R knee in slightly flexed position , Scar massage , massage roller to thigh   [facility] Adult PT Treatment:                                                DATE: 02/23/21 Therapeutic Exercise: Nustep L5 UE/LE x 5 min while taking subjective Step stretch for right knee flexion 10 sec x 10 6in step Standing hamstring curl x 15 R Heel-Toe raises x 15 Slant board stretch 2x30"  Seated heel slide 2x10 - 10" hold R LAQ R 2x10 3# Long sitting right hamstring stretch 2x30" Supine quad set x 15 - 5" R SLR 2x10 R Supine heel slide w/ strap x 15 - 10" R Manual Therapy: AP mobs to R knee in slightly flexed position  Modalities: ice pack 10 minutes right knee   PATIENT EDUCATION:  Education details: continue HEP Person educated: Patient Education method: Explanation, Demonstration, and Handouts Education comprehension: verbalized understanding and returned demonstration     HOME EXERCISE PROGRAM: Access Code: [email or web address] URL: [email or web address] Date: 03/02/2021 Prepared by: Octavio Manns  Exercises Seated Heel Slide - 3-4 x daily - 7 x weekly - 2 sets - 10 reps - 10" hold Seated Quad Set - 3-4 x daily - 7 x weekly - 2 sets - 10 reps - 5" hold Seated Long Arc Quad - 3-4 x daily - 7 x weekly - 3 sets - 10 reps Supine Quadricep Sets - 3-4 x daily - 7 x weekly - 2 sets - 10 reps - 5" hold Supine Heel Slide with Strap - 5-7 x daily - 7 x weekly - 2 sets - 10 reps - 10" hold Supine Knee Flexion Wall Slide - 1 x daily - 7 x weekly - 10 reps - 5 hold    ASSESSMENT: Pt able to make full revolutions on Rec bike with assist at first and then independently. Began knee machines for strengthening. Continued ROM. Pt reports using RW more since her left knee injection.  She feels the left knee is buckling more since the injection. AAROM measures did not improve today.     GOALS: Goals reviewed with patient? No   SHORT TERM GOALS:   STG Name Target Date Goal status  1 Pt will be compliant and knowledgeable with initial HEP for improved comfort and carryover Baseline: initial HEP given 02/22/2021 INITIAL  2 Pt will self report R knee pain no greater than 6/10 for improved comfort and functional ability Baseline: 8/10 at worst 02/22/2021 INITIAL    LONG TERM GOALS:    LTG Name Target Date Goal status  1 Pt will improve FOTO function score to no less than 71% as proxy for functional improvement Baseline: 59% function 04/26/2021 INITIAL  2 Pt will improve R knee AROM to no less than 3-110 degrees for improved functional mobility Baseline: 15-60 degrees 04/26/2021 INITIAL  3 Pt will self report R knee pain no greater than 3/10 for improved comfort  and functional ability Baseline: 8/10 at worst 04/26/2021 INITIAL  4 Pt will increase reps in 30" STS to no less than 10 without use of UE for improved balance and functional mobility Baseline: 04/26/2021 INITIAL  5 Pt will be able to amb up/down 10 stairs with reciprocal gait and no increase in pain for improved community navigation Baseline: unable 04/26/2021 INITIAL    PLAN: PT FREQUENCY: 2x/week   PT DURATION: 12 weeks   PLANNED INTERVENTIONS: Therapeutic exercises, Therapeutic activity, Neuro Muscular re-education, Balance training, Gait training, Patient/Family education, Joint mobilization, Aquatic Therapy, Dry Needling, Cryotherapy, Moist heat, Vasopneumatic device, and Manual therapy   PLAN FOR NEXT SESSION: manual to reduce medial knee pain during flexion , assess HEP response, PROM to R knee, progress as able, continue scar massage and STW , gait with [facility]    Hessie Diener, PTA 03/04/21 11:57 AM Phone: [phone] Fax: [phone]

## 2021-03-08 ENCOUNTER — Other Ambulatory Visit: Payer: Self-pay

## 2021-03-08 ENCOUNTER — Encounter: Payer: Self-pay | Admitting: Physical Therapy

## 2021-03-08 ENCOUNTER — Ambulatory Visit: Payer: 59 | Admitting: Physical Therapy

## 2021-03-08 DIAGNOSIS — M25661 Stiffness of right knee, not elsewhere classified: Secondary | ICD-10-CM

## 2021-03-08 DIAGNOSIS — M6281 Muscle weakness (generalized): Secondary | ICD-10-CM

## 2021-03-08 DIAGNOSIS — R262 Difficulty in walking, not elsewhere classified: Secondary | ICD-10-CM

## 2021-03-08 NOTE — Therapy (Signed)
OUTPATIENT PHYSICAL THERAPY TREATMENT NOTE   Patient Name: Kayla Larsen MRN: [id number] DOB:1970-06-16, 51 y.o., female Today's Date: 03/08/2021  PCP: Carlena Hurl, PA-C REFERRING PROVIDER: Carlena Hurl, PA-C   PT End of Session - 03/08/21 [id number]     Visit Number 9    Number of Visits 21    Date for PT Re-Evaluation 04/26/21    Authorization Type [employer]et[employer]    PT Start Time 0800    PT Stop Time 3267    PT Time Calculation (min) 55 min                Past Medical History:  Diagnosis Date   Acanthosis nigricans    Anemia    Arthritis of knee    External hemorrhoid    History of blood transfusion 2009   2 units after hysterectomy   Hypertension    Obesity    Plantar fasciitis of left foot    Sleep apnea 2010   borderline no cpap used   Wears glasses    Past Surgical History:  Procedure Laterality Date   CESAREAN SECTION     x 2   COLONOSCOPY  2009   done for anemia eval; Dr. Benson [place]   COLONOSCOPY  01/25/2018   multiple aphthous ulcers in terminal ileum likely d/t NSAIDs, diverticulosis in sigmoid colon, Dr. [place] Cellar   KNEE SURGERY  10/2015   knot removed from R knee   LAPAROTOMY Left 07/30/2012   Procedure: LEFT SALPINGO OOPHERECTOMY,  OMENTECTOMY, AND LYSIS OF ADHESIONS;  Surgeon: Alvino Chapel, MD;  Location: [facility];  Service: Gynecology;  Laterality: Left;   OOPHORECTOMY  2014   left    TOTAL ABDOMINAL HYSTERECTOMY  2009   TOTAL KNEE ARTHROPLASTY Right 01/14/2021   Procedure: Right TOTAL KNEE ARTHROPLASTY;  Surgeon: [name], MD;  Location: [facility];  Service: Orthopedics;  Laterality: Right;  RNFA   Patient Active Problem List   Diagnosis Date Noted   Status post total right knee replacement 01/16/2021   Status post right knee replacement 01/14/2021   Encounter for health maintenance examination in adult 12/27/2020   Screening for heart disease 12/27/2020   History of gout 12/27/2020   Iron deficiency 12/27/2020    Screening for diabetes mellitus 12/27/2020   BMI 38.0-38.9,adult 12/27/2020   OAB (overactive bladder) 12/27/2020   Hypokalemia 12/27/2020   Primary osteoarthritis of right knee 11/16/2020   H/O: hysterectomy 09/21/2020   Vaccine counseling 11/20/2019   Encounter for screening mammogram for malignant neoplasm of breast 11/20/2019   Gout of foot 11/20/2019   Encounter for hepatitis C screening test for low risk patient 11/20/2019   Primary osteoarthritis of both knees 05/14/2019   Diverticulosis 11/19/2018   Vitamin D deficiency 11/24/2016   S/P right knee arthroscopy 12/13/2015   Obesity with serious comorbidity 05/03/2015   Overactive bladder 05/03/2015   Essential hypertension 05/03/2015   Routine general medical examination at a health care facility 05/03/2015   Arthritis, senescent 05/03/2015   Hypertriglyceridemia 05/03/2015   Impaired fasting blood sugar 05/03/2015   PCP: Carlena Hurl, PA-C   REFERRING PROVIDER: [name]*   REFERRING DIAG: T24.580 (ICD-10-CM) - Status post total right knee replacement    THERAPY DIAG:  Muscle weakness (generalized)  Difficulty in walking, not elsewhere classified  Stiffness of right knee, not elsewhere classified  PERTINENT HISTORY: Pt is a 51 y/o F s/p R TKA performed by Dr. Ninfa Linden on 01/14/2021   ONSET DATE:  DOS: 01/14/2021  PRECAUTIONS: None  SUBJECTIVE: Pt reports left knee is buckling more since injection so she is using the RW.   Pain: Are you having pain? Yes NPRS: 4-5/10 R knee; 9/10 L knee Pain location: R medial knee PAIN TYPE: sharp and tight Pain description: intermittent  Aggravating factors: touch, bending forward Relieving factors: ice, compression   OBJECTIVE:    PATIENT SURVEYS:  FOTO 59% function; 71% predicted    COGNITION:          Overall cognitive status: Within functional limits for tasks assessed                        SENSATION:          Light touch: Appears intact    POSTURE:  Medium body habitus, L knee varus, increased swelling noted in R knee   PALPATION: TTP to R medial knee   LE AROM/PROM:   A/PROM Right 02/01/2021 Right 02/09/21 Right 02/16/21 Right 02/23/2021 Right  02/25/21 Right 03/04/21 Right  03/08/21  Knee flexion 60 73 AAROM 82 AAROM 90 AAROM 94 AAROM 92 AAROm-supine and feet on wall  95 AAROM strap  Knee extension 15   5      (Blank rows = not tested)     LOWER EXTREMITY SPECIAL TESTS:  N/A   FUNCTIONAL TESTS:  30"STS: 8 reps with UE assist   GAIT: Distance walked: 75f Assistive device utilized: WEnvironmental consultant- 2 wheeled Level of assistance: Modified independence Comments: antalgic gait, decreased R knee flexion       TODAY'S TREATMENT: [facility] Adult PT Treatment:                                                DATE: 03/08/21 Therapeutic Exercise: Nustep L6 UE/LE x 5 min while taking subjective Seated leg press 45# bil x 20, single leg 45# x 15  Rec bike full revolutions - min hip hike x 4 min Step stretch for knee flexion Passive hip fleor stretch Passive knee flexion Patella mobs Hamstring curls with feet on ball  Bridge with calves over ball x 15 Seated hamstring curl blue band _0  20  Right SLR  Supine strap hamstring stretch   Manual Therapy:  Seated AP mobs to R knee in  flexed position/ strap asssited knee flexion with distraction,  massage to VMO Modalities: ice pack 10 minutes right knee   [facility] Adult PT Treatment:                                                DATE: 03/04/21 Therapeutic Exercise: Nustep L6 UE/LE x 5 min while taking subjective Seated leg press 35# bil x 20, single leg 35# x 15  Rec bike partial to full revolutions - min hip hike x 3 min Knee ext 5# bilat with right eccentric Knee flex 20# bilat with right eccentric  Passive hip flexor stretch  Heel slides with strap x 10 for 10 sec  Heel slide on wall R x 10 - 5" hold Supine hamstring stretch with strap Manual Therapy:  AP mobs to R knee  in  flexed position massage roller to thigh Modalities: ice pack 10 minutes right knee  [facility] Adult PT Treatment:                                                DATE: 03/02/21 Therapeutic Exercise: Nustep L5 UE/LE x 3 min while taking subjective Rec bike partial revolutions x 3 minutes Step stretch for right knee flexion 10 sec x 10 6in step Heel slides with strap x 10 for 10 sec  Heel slide on wall R x 10 - 5" hold Manual Therapy: Seated knee flexion stretch w/ manipulation belt and overpressure by PT AP mobs to R knee in slightly flexed position , Scar massage , massage roller to thigh  [facility] Adult PT Treatment:                                                DATE: 02/25/21 Therapeutic Exercise: Nustep L5 UE/LE x 5 min while taking subjective Rec bike partial revolutions x 3 minutes Step stretch for right knee flexion 10 sec x 10 6in step Manual performed at this time Hip flexor stretch with strap EOM 3 x 30 sec  Heel slides with strap x 10 for 10 sec  PROM 94 knee flexion Hamstring stretch with strap  Manual Therapy: AP mobs to R knee in slightly flexed position , Scar massage , massage roller to thigh   [facility] Adult PT Treatment:                                                DATE: 02/23/21 Therapeutic Exercise: Nustep L5 UE/LE x 5 min while taking subjective Step stretch for right knee flexion 10 sec x 10 6in step Standing hamstring curl x 15 R Heel-Toe raises x 15 Slant board stretch 2x30"  Seated heel slide 2x10 - 10" hold R LAQ R 2x10 3# Long sitting right hamstring stretch 2x30" Supine quad set x 15 - 5" R SLR 2x10 R Supine heel slide w/ strap x 15 - 10" R Manual Therapy: AP mobs to R knee in slightly flexed position  Modalities: ice pack 10 minutes right knee   PATIENT EDUCATION:  Education details: continue HEP Person educated: Patient Education method: Explanation, Demonstration, and Handouts Education comprehension: verbalized understanding and returned  demonstration     HOME EXERCISE PROGRAM: Access Code: [email or web address] URL: [email or web address] Date: 03/02/2021 Prepared by: Octavio Manns  Exercises Seated Heel Slide - 3-4 x daily - 7 x weekly - 2 sets - 10 reps - 10" hold Seated Quad Set - 3-4 x daily - 7 x weekly - 2 sets - 10 reps - 5" hold Seated Long Arc Quad - 3-4 x daily - 7 x weekly - 3 sets - 10 reps Supine Quadricep Sets - 3-4 x daily - 7 x weekly - 2 sets - 10 reps - 5" hold Supine Heel Slide with Strap - 5-7 x daily - 7 x weekly - 2 sets - 10 reps - 10" hold Supine Knee Flexion Wall Slide - 1 x daily - 7 x weekly - 10 reps - 5 hold    ASSESSMENT: Pt able to make full revolutions again on bike  with hip hike. Continued with knee flexion ROM focus with pt achieving 95 AAROM by end of session after mobs and passive ROM. Her knee pain remains high with increased pain especially with flexion stretching, medial knee. She is independent with initial HEP. STG#1 met.    GOALS: Goals reviewed with patient? No   SHORT TERM GOALS:   STG Name Target Date Goal status  1 Pt will be compliant and knowledgeable with initial HEP for improved comfort and carryover Baseline: initial HEP given 02/22/2021 Met  03/08/21  2 Pt will self report R knee pain no greater than 6/10 for improved comfort and functional ability Baseline: 8/10 at worst Status: 03/08/21: 7-8/10 pain at worst  02/22/2021 ONGOING  03/08/21    LONG TERM GOALS:    LTG Name Target Date Goal status  1 Pt will improve FOTO function score to no less than 71% as proxy for functional improvement Baseline: 59% function 04/26/2021 INITIAL  2 Pt will improve R knee AROM to no less than 3-110 degrees for improved functional mobility Baseline: 15-60 degrees 04/26/2021 INITIAL  3 Pt will self report R knee pain no greater than 3/10 for improved comfort and functional ability Baseline: 8/10 at worst 04/26/2021 INITIAL  4 Pt will increase reps in 30" STS to no less than 10  without use of UE for improved balance and functional mobility Baseline: 04/26/2021 INITIAL  5 Pt will be able to amb up/down 10 stairs with reciprocal gait and no increase in pain for improved community navigation Baseline: unable 04/26/2021 INITIAL    PLAN: PT FREQUENCY: 2x/week   PT DURATION: 12 weeks   PLANNED INTERVENTIONS: Therapeutic exercises, Therapeutic activity, Neuro Muscular re-education, Balance training, Gait training, Patient/Family education, Joint mobilization, Aquatic Therapy, Dry Needling, Cryotherapy, Moist heat, Vasopneumatic device, and Manual therapy   PLAN FOR NEXT SESSION: manual to reduce medial knee pain during flexion , assess HEP response, PROM to R knee, progress as able, continue scar massage and STW , gait with [place], PTA 03/08/21 9:08 AM Phone: [phone] Fax: [phone]

## 2021-03-09 ENCOUNTER — Ambulatory Visit (INDEPENDENT_AMBULATORY_CARE_PROVIDER_SITE_OTHER): Payer: 59 | Admitting: Podiatry

## 2021-03-09 ENCOUNTER — Ambulatory Visit (INDEPENDENT_AMBULATORY_CARE_PROVIDER_SITE_OTHER): Payer: 59

## 2021-03-09 ENCOUNTER — Ambulatory Visit: Payer: 59

## 2021-03-09 DIAGNOSIS — M79671 Pain in right foot: Secondary | ICD-10-CM | POA: Diagnosis not present

## 2021-03-09 DIAGNOSIS — M1A0710 Idiopathic chronic gout, right ankle and foot, without tophus (tophi): Secondary | ICD-10-CM | POA: Diagnosis not present

## 2021-03-09 DIAGNOSIS — M79672 Pain in left foot: Secondary | ICD-10-CM

## 2021-03-09 MED ORDER — ALLOPURINOL 100 MG PO TABS: 100.0000 mg | ORAL_TABLET | Freq: Every day | ORAL | 3 refills | Status: DC

## 2021-03-09 NOTE — Progress Notes (Signed)
? ?  HPI: 51 y.o. female presenting today as a new patient for evaluation of chronic gout to the patient's right foot.  She says that she does get intermittent gout and she has been on allopurinol for years.  She used to see Dr. Gershon Mussel, local podiatrist, who is office just shut down recently and she needs to reestablish with podiatry.  She says that Dr. Gershon Mussel used to prescribe her allopurinol.  Currently she is not dealing with an acute flareup.  She presents for further treatment and evaluation ? ?Past Medical History:  ?Diagnosis Date  ? Acanthosis nigricans   ? Anemia   ? Arthritis of knee   ? External hemorrhoid   ? History of blood transfusion 2009  ? 2 units after hysterectomy  ? Hypertension   ? Obesity   ? Plantar fasciitis of left foot   ? Sleep apnea 2010  ? borderline no cpap used  ? Wears glasses   ? ? ?Past Surgical History:  ?Procedure Laterality Date  ? CESAREAN SECTION    ? x 2  ? COLONOSCOPY  2009  ? done for anemia eval; Dr. Benson Norway  ? COLONOSCOPY  01/25/2018  ? multiple aphthous ulcers in terminal ileum likely d/t NSAIDs, diverticulosis in sigmoid colon, Dr. [place] Cellar  ? KNEE SURGERY  10/2015  ? knot removed from R knee  ? LAPAROTOMY Left 07/30/2012  ? Procedure: LEFT SALPINGO OOPHERECTOMY,  OMENTECTOMY, AND LYSIS OF ADHESIONS;  Surgeon: Alvino Chapel, MD;  Location: [facility];  Service: Gynecology;  Laterality: Left;  ? OOPHORECTOMY  2014  ? left   ? TOTAL ABDOMINAL HYSTERECTOMY  2009  ? TOTAL KNEE ARTHROPLASTY Right 01/14/2021  ? Procedure: Right TOTAL KNEE ARTHROPLASTY;  Surgeon: [name], MD;  Location: [facility];  Service: Orthopedics;  Laterality: Right;  RNFA  ? ? ?Allergies  ?Allergen Reactions  ? Amlodipine   ?  Ankle swelling at 10mg   ? Lisinopril   ?  Cough ?  ? ?  ?Physical Exam: ?General: The patient is alert and oriented x3 in no acute distress. ? ?Dermatology: Skin is warm, dry and supple bilateral lower extremities. Negative for open lesions or  macerations. ? ?Vascular: Palpable pedal pulses bilaterally. Capillary refill within normal limits.  Negative for any significant edema or erythema ? ?Neurological: Light touch and protective threshold grossly intact ? ?Musculoskeletal Exam: No pedal deformities noted ? ?Radiographic Exam:  ?Normal osseous mineralization.  Mild degenerative changes noted throughout the pedal joints of the foot.  No acute fractures identified ? ?Assessment: ?1.  Chronic history of gout involving toe of right foot ? ? ?Plan of Care:  ?1. Patient evaluated. X-Rays reviewed.  ?2.  Refill prescription for allopurinol 100 mg daily ?3.  Continue good supportive shoes and sneakers ?4.  Return to clinic as needed ? ?  ?  ?Edrick Kins, DPM ?[facility] ? ?Dr. Edrick Kins, DPM  ?  ?[address]                                        ?[place], [place] [address]                ?Office [phone]  ?Fax [phone] ? ? ? ? ?

## 2021-03-09 NOTE — Progress Notes (Signed)
xray

## 2021-03-10 ENCOUNTER — Other Ambulatory Visit: Payer: Self-pay

## 2021-03-10 ENCOUNTER — Ambulatory Visit: Payer: 59 | Attending: Orthopaedic Surgery

## 2021-03-10 ENCOUNTER — Encounter: Payer: Self-pay | Admitting: Orthopaedic Surgery

## 2021-03-10 ENCOUNTER — Ambulatory Visit (INDEPENDENT_AMBULATORY_CARE_PROVIDER_SITE_OTHER): Payer: 59 | Admitting: Orthopaedic Surgery

## 2021-03-10 DIAGNOSIS — M6281 Muscle weakness (generalized): Secondary | ICD-10-CM | POA: Insufficient documentation

## 2021-03-10 DIAGNOSIS — R262 Difficulty in walking, not elsewhere classified: Secondary | ICD-10-CM | POA: Insufficient documentation

## 2021-03-10 DIAGNOSIS — M25661 Stiffness of right knee, not elsewhere classified: Secondary | ICD-10-CM | POA: Diagnosis present

## 2021-03-10 DIAGNOSIS — Z96651 Presence of right artificial knee joint: Secondary | ICD-10-CM

## 2021-03-10 NOTE — Progress Notes (Signed)
The patient is right at 2 months status post a right total knee arthroplasty.  At her last visit I can barely flex her to past 80 degrees.  She said therapy is now gone her to 100 degrees and she is pleased and does not feel like a manipulation is necessary.  She is dealing with severe arthritis of her left knee.  A recent steroid injection did not help that knee at all.  She does ambulate with a walker but that is more she states for her left knee and not a right knee. ? ?On my exam today there is only some mild swelling of the right operative knee.  Her extension is full and I could only flex her to about 95 today but this is still improvement. ? ?She feels like she can get better motion of that knee with time and she does not need a manipulation from our standpoint and given the progress she is made over the last 2 weeks I feel comfortable with not manipulating her knee. ? ?We will see her back in 4 weeks to see how she is doing overall but no x-rays are needed.  At some point we will determine when the best time to proceed with the left knee replacement will be. ?

## 2021-03-10 NOTE — Therapy (Signed)
OUTPATIENT PHYSICAL THERAPY TREATMENT NOTE   Patient Name: MARIJAYNE RAUTH MRN: [id number] DOB:1970-11-05, 51 y.o., female Today's Date: 03/10/2021  PCP: Carlena Hurl, PA-C REFERRING PROVIDER: [name]*   PT End of Session - 03/10/21 1052     Visit Number 10    Number of Visits 21    Date for PT Re-Evaluation 04/26/21    Authorization Type [employer]    PT Start Time 2637    PT Stop Time 1130    PT Time Calculation (min) 39 min    Activity Tolerance Patient tolerated treatment well    Behavior During Therapy [facility] for tasks assessed/performed                 Past Medical History:  Diagnosis Date   Acanthosis nigricans    Anemia    Arthritis of knee    External hemorrhoid    History of blood transfusion 2009   2 units after hysterectomy   Hypertension    Obesity    Plantar fasciitis of left foot    Sleep apnea 2010   borderline no cpap used   Wears glasses    Past Surgical History:  Procedure Laterality Date   CESAREAN SECTION     x 2   COLONOSCOPY  2009   done for anemia eval; Dr. Benson [place]   COLONOSCOPY  01/25/2018   multiple aphthous ulcers in terminal ileum likely d/t NSAIDs, diverticulosis in sigmoid colon, Dr. [place] Cellar   KNEE SURGERY  10/2015   knot removed from R knee   LAPAROTOMY Left 07/30/2012   Procedure: LEFT SALPINGO OOPHERECTOMY,  OMENTECTOMY, AND LYSIS OF ADHESIONS;  Surgeon: Alvino Chapel, MD;  Location: [facility];  Service: Gynecology;  Laterality: Left;   OOPHORECTOMY  2014   left    TOTAL ABDOMINAL HYSTERECTOMY  2009   TOTAL KNEE ARTHROPLASTY Right 01/14/2021   Procedure: Right TOTAL KNEE ARTHROPLASTY;  Surgeon: [name], MD;  Location: [facility];  Service: Orthopedics;  Laterality: Right;  RNFA   Patient Active Problem List   Diagnosis Date Noted   Status post total right knee replacement 01/16/2021   Status post right knee replacement 01/14/2021   Encounter for health maintenance examination in  adult 12/27/2020   Screening for heart disease 12/27/2020   History of gout 12/27/2020   Iron deficiency 12/27/2020   Screening for diabetes mellitus 12/27/2020   BMI 38.0-38.9,adult 12/27/2020   OAB (overactive bladder) 12/27/2020   Hypokalemia 12/27/2020   Primary osteoarthritis of right knee 11/16/2020   H/O: hysterectomy 09/21/2020   Vaccine counseling 11/20/2019   Encounter for screening mammogram for malignant neoplasm of breast 11/20/2019   Gout of foot 11/20/2019   Encounter for hepatitis C screening test for low risk patient 11/20/2019   Primary osteoarthritis of both knees 05/14/2019   Diverticulosis 11/19/2018   Vitamin D deficiency 11/24/2016   S/P right knee arthroscopy 12/13/2015   Obesity with serious comorbidity 05/03/2015   Overactive bladder 05/03/2015   Essential hypertension 05/03/2015   Routine general medical examination at a health care facility 05/03/2015   Arthritis, senescent 05/03/2015   Hypertriglyceridemia 05/03/2015   Impaired fasting blood sugar 05/03/2015   PCP: Carlena Hurl, PA-C   REFERRING PROVIDER: [name]*   REFERRING DIAG: C58.850 (ICD-10-CM) - Status post total right knee replacement    THERAPY DIAG:  Muscle weakness (generalized)  Difficulty in walking, not elsewhere classified  Stiffness of right knee, not elsewhere classified  PERTINENT HISTORY: Pt  is a 51 y/o F s/p R TKA performed by Dr. Ninfa Linden on 01/14/2021   ONSET DATE: DOS: 01/14/2021  PRECAUTIONS: None  SUBJECTIVE: I see Dr. Ninfa Linden today at 3. My left knee hurts worse than my right one does.  Pain: Are you having pain? Yes NPRS: 0/10 R knee; 9/10 L knee Pain location: R medial knee PAIN TYPE: sharp and tight Pain description: intermittent  Aggravating factors: touch, bending forward Relieving factors: ice, compression   OBJECTIVE:    PATIENT SURVEYS:  FOTO 59% function; 71% predicted  03/10/2021: 61%   COGNITION:          Overall  cognitive status: Within functional limits for tasks assessed                        SENSATION:          Light touch: Appears intact   POSTURE:  Medium body habitus, L knee varus, increased swelling noted in R knee   PALPATION: TTP to R medial knee   LE AROM/PROM:   A/PROM Right 02/01/2021 Right 02/09/21 Right 02/16/21 Right 02/23/2021 Right  02/25/21 Right 03/04/21 Right  03/08/21 Right 03/10/21  Knee flexion 60 73 AAROM 82 AAROM 90 AAROM 94 AAROM 92 AAROm-supine and feet on wall  95 AAROM strap 100 AAROM  Knee extension 15   5       (Blank rows = not tested)     LOWER EXTREMITY SPECIAL TESTS:  N/A   FUNCTIONAL TESTS:  30"STS: 8 reps with UE assist   GAIT: Distance walked: 60f Assistive device utilized: WEnvironmental consultant- 2 wheeled Level of assistance: Modified independence Comments: antalgic gait, decreased R knee flexion       TODAY'S TREATMENT: [facility] Adult PT Treatment:                                                DATE: 03/10/2021 Therapeutic Exercise: Nustep L6 UE/LE x 5 min while taking subjective Seated leg press 45# bil x 20, single leg 45# x 15  Rec bike full revolutions - min hip hike x 4 min Step stretch for knee flexion 3 x 30" Step hamstring stretch 3 x 30" Supine strap hamstring stretch 2 x 30" Manual Therapy: Supine AP mobs to R knee in  flexed position/ strap asssited knee flexion,  massage to VMO Modalities: ice pack 10 minutes right knee    [facility] Adult PT Treatment:                                                DATE: 03/08/21 Therapeutic Exercise: Nustep L6 UE/LE x 5 min while taking subjective Seated leg press 45# bil x 20, single leg 45# x 15  Rec bike full revolutions - min hip hike x 4 min Step stretch for knee flexion Passive hip fleor stretch Passive knee flexion Patella mobs Hamstring curls with feet on ball  Bridge with calves over ball x 15 Seated hamstring curl blue band \x 20  Right SLR  Supine strap hamstring stretch   Manual  Therapy:  Seated AP mobs to R knee in  flexed position/ strap asssited knee flexion with distraction,  massage to VMO Modalities: ice pack 10  minutes right knee   [facility] Adult PT Treatment:                                                DATE: 03/04/21 Therapeutic Exercise: Nustep L6 UE/LE x 5 min while taking subjective Seated leg press 35# bil x 20, single leg 35# x 15  Rec bike partial to full revolutions - min hip hike x 3 min Knee ext 5# bilat with right eccentric Knee flex 20# bilat with right eccentric  Passive hip flexor stretch  Heel slides with strap x 10 for 10 sec  Heel slide on wall R x 10 - 5" hold Supine hamstring stretch with strap Manual Therapy:  AP mobs to R knee in  flexed position massage roller to thigh Modalities: ice pack 10 minutes right knee   PATIENT EDUCATION:  Education details: continue HEP Person educated: Patient Education method: Explanation, Demonstration, and Handouts Education comprehension: verbalized understanding and returned demonstration     HOME EXERCISE PROGRAM: Access Code: [email or web address] URL: [email or web address] Date: 03/02/2021 Prepared by: Octavio Manns  Exercises Seated Heel Slide - 3-4 x daily - 7 x weekly - 2 sets - 10 reps - 10" hold Seated Quad Set - 3-4 x daily - 7 x weekly - 2 sets - 10 reps - 5" hold Seated Long Arc Quad - 3-4 x daily - 7 x weekly - 3 sets - 10 reps Supine Quadricep Sets - 3-4 x daily - 7 x weekly - 2 sets - 10 reps - 5" hold Supine Heel Slide with Strap - 5-7 x daily - 7 x weekly - 2 sets - 10 reps - 10" hold Supine Knee Flexion Wall Slide - 1 x daily - 7 x weekly - 10 reps - 5 hold    ASSESSMENT: Patient presents to PT with no pain in her R knee and high levels of pain in her L knee. She was able to make full revolutions on the the bike again with a hip hike. She reports pain in the medial side of her knee with flexion stretches. Focus of session continued on knee flexion ROM with patient  achieving 100 AAROM by end of session after AP mobilizations and passive ROM. Patient continues to benefit from skilled PT services and should be progressed as able to improve functional independence.     GOALS: Goals reviewed with patient? No   SHORT TERM GOALS:   STG Name Target Date Goal status  1 Pt will be compliant and knowledgeable with initial HEP for improved comfort and carryover Baseline: initial HEP given 02/22/2021 Met  03/08/21  2 Pt will self report R knee pain no greater than 6/10 for improved comfort and functional ability Baseline: 8/10 at worst Status: 03/08/21: 7-8/10 pain at worst  02/22/2021 ONGOING  03/08/21    LONG TERM GOALS:    LTG Name Target Date Goal status  1 Pt will improve FOTO function score to no less than 71% as proxy for functional improvement Baseline: 59% function 04/26/2021 INITIAL  2 Pt will improve R knee AROM to no less than 3-110 degrees for improved functional mobility Baseline: 15-60 degrees 04/26/2021 INITIAL  3 Pt will self report R knee pain no greater than 3/10 for improved comfort and functional ability Baseline: 8/10 at worst 04/26/2021 INITIAL  4 Pt will increase reps in 30" STS to no  less than 10 without use of UE for improved balance and functional mobility Baseline: 04/26/2021 INITIAL  5 Pt will be able to amb up/down 10 stairs with reciprocal gait and no increase in pain for improved community navigation Baseline: unable 04/26/2021 INITIAL    PLAN: PT FREQUENCY: 2x/week   PT DURATION: 12 weeks   PLANNED INTERVENTIONS: Therapeutic exercises, Therapeutic activity, Neuro Muscular re-education, Balance training, Gait training, Patient/Family education, Joint mobilization, Aquatic Therapy, Dry Needling, Cryotherapy, Moist heat, Vasopneumatic device, and Manual therapy   PLAN FOR NEXT SESSION: manual to reduce medial knee pain during flexion , assess HEP response, PROM to R knee, progress as able, continue scar massage and STW , gait  with SPC    Evelene Croon, PTA 03/10/21 11:33 AM

## 2021-03-11 ENCOUNTER — Other Ambulatory Visit: Payer: Self-pay | Admitting: Orthopaedic Surgery

## 2021-03-11 MED ORDER — TIZANIDINE HCL 4 MG PO TABS: 4.0000 mg | ORAL_TABLET | Freq: Three times a day (TID) | ORAL | 0 refills | Status: DC | PRN

## 2021-03-11 MED ORDER — HYDROCODONE-ACETAMINOPHEN 7.5-325 MG PO TABS: 1.0000 | ORAL_TABLET | Freq: Four times a day (QID) | ORAL | 0 refills | Status: DC | PRN

## 2021-03-14 ENCOUNTER — Ambulatory Visit: Payer: 59 | Admitting: Physical Therapy

## 2021-03-14 ENCOUNTER — Encounter: Payer: Self-pay | Admitting: Physical Therapy

## 2021-03-14 ENCOUNTER — Other Ambulatory Visit: Payer: Self-pay | Admitting: Orthopaedic Surgery

## 2021-03-14 ENCOUNTER — Other Ambulatory Visit: Payer: Self-pay

## 2021-03-14 DIAGNOSIS — M25661 Stiffness of right knee, not elsewhere classified: Secondary | ICD-10-CM

## 2021-03-14 DIAGNOSIS — M6281 Muscle weakness (generalized): Secondary | ICD-10-CM | POA: Diagnosis not present

## 2021-03-14 DIAGNOSIS — R262 Difficulty in walking, not elsewhere classified: Secondary | ICD-10-CM

## 2021-03-14 NOTE — Therapy (Signed)
OUTPATIENT PHYSICAL THERAPY TREATMENT NOTE   Patient Name: Kayla Larsen MRN: [id number] DOB:1970/07/28, 51 y.o., female Today's Date: 03/14/2021  PCP: Carlena Hurl, PA-C REFERRING PROVIDER: Carlena Hurl, PA-C   PT End of Session - 03/14/21 1110     Visit Number 11    Number of Visits 21    Date for PT Re-Evaluation 04/26/21    Authorization Type [employer]    PT Start Time 1102    PT Stop Time 1145    PT Time Calculation (min) 43 min                 Past Medical History:  Diagnosis Date   Acanthosis nigricans    Anemia    Arthritis of knee    External hemorrhoid    History of blood transfusion 2009   2 units after hysterectomy   Hypertension    Obesity    Plantar fasciitis of left foot    Sleep apnea 2010   borderline no cpap used   Wears glasses    Past Surgical History:  Procedure Laterality Date   CESAREAN SECTION     x 2   COLONOSCOPY  2009   done for anemia eval; Dr. Benson [place]   COLONOSCOPY  01/25/2018   multiple aphthous ulcers in terminal ileum likely d/t NSAIDs, diverticulosis in sigmoid colon, Dr. [place] Cellar   KNEE SURGERY  10/2015   knot removed from R knee   LAPAROTOMY Left 07/30/2012   Procedure: LEFT SALPINGO OOPHERECTOMY,  OMENTECTOMY, AND LYSIS OF ADHESIONS;  Surgeon: Alvino Chapel, MD;  Location: [facility];  Service: Gynecology;  Laterality: Left;   OOPHORECTOMY  2014   left    TOTAL ABDOMINAL HYSTERECTOMY  2009   TOTAL KNEE ARTHROPLASTY Right 01/14/2021   Procedure: Right TOTAL KNEE ARTHROPLASTY;  Surgeon: [name], MD;  Location: [facility];  Service: Orthopedics;  Laterality: Right;  RNFA   Patient Active Problem List   Diagnosis Date Noted   Status post total right knee replacement 01/16/2021   Status post right knee replacement 01/14/2021   Encounter for health maintenance examination in adult 12/27/2020   Screening for heart disease 12/27/2020   History of gout 12/27/2020   Iron deficiency 12/27/2020    Screening for diabetes mellitus 12/27/2020   BMI 38.0-38.9,adult 12/27/2020   OAB (overactive bladder) 12/27/2020   Hypokalemia 12/27/2020   Primary osteoarthritis of right knee 11/16/2020   H/O: hysterectomy 09/21/2020   Vaccine counseling 11/20/2019   Encounter for screening mammogram for malignant neoplasm of breast 11/20/2019   Gout of foot 11/20/2019   Encounter for hepatitis C screening test for low risk patient 11/20/2019   Primary osteoarthritis of both knees 05/14/2019   Diverticulosis 11/19/2018   Vitamin D deficiency 11/24/2016   S/P right knee arthroscopy 12/13/2015   Obesity with serious comorbidity 05/03/2015   Overactive bladder 05/03/2015   Essential hypertension 05/03/2015   Routine general medical examination at a health care facility 05/03/2015   Arthritis, senescent 05/03/2015   Hypertriglyceridemia 05/03/2015   Impaired fasting blood sugar 05/03/2015   PCP: Carlena Hurl, PA-C   REFERRING PROVIDER: [name]*   REFERRING DIAG: P59.163 (ICD-10-CM) - Status post total right knee replacement    THERAPY DIAG:  Muscle weakness (generalized)  Difficulty in walking, not elsewhere classified  Stiffness of right knee, not elsewhere classified  PERTINENT HISTORY: Pt is a 51 y/o F s/p R TKA performed by Dr. Ninfa Linden on 01/14/2021   ONSET DATE:  DOS: 01/14/2021  PRECAUTIONS: None  SUBJECTIVE: Saw MD who wants to do my other knee but FMLA can only be once a year.   Pain: Are you having pain? Yes NPRS: 5/10 R knee; 7/10 L knee Pain location: R medial knee PAIN TYPE: sharp and tight Pain description: intermittent  Aggravating factors: touch, bending forward Relieving factors: ice, compression   OBJECTIVE:    PATIENT SURVEYS:  FOTO 59% function; 71% predicted  03/10/2021: 61%   COGNITION:          Overall cognitive status: Within functional limits for tasks assessed                        SENSATION:          Light touch: Appears  intact   POSTURE:  Medium body habitus, L knee varus, increased swelling noted in R knee   PALPATION: TTP to R medial knee   LE AROM/PROM:   A/PROM Right 02/01/2021 Right 02/09/21 Right 02/16/21 Right 02/23/2021 Right  02/25/21 Right 03/04/21 Right  03/08/21 Right 03/10/21  Knee flexion 60 73 AAROM 82 AAROM 90 AAROM 94 AAROM 92 AAROm-supine and feet on wall  95 AAROM strap 100 AAROM  Knee extension 15   5       (Blank rows = not tested)     LOWER EXTREMITY SPECIAL TESTS:  N/A   FUNCTIONAL TESTS:  30"STS: 8 reps with UE assist   GAIT: Distance walked: 86f Assistive device utilized: WEnvironmental consultant- 2 wheeled Level of assistance: Modified independence Comments: antalgic gait, decreased R knee flexion       TODAY'S TREATMENT: [facility] Adult PT Treatment:                                                DATE: 03/14/2021 Therapeutic Exercise: Nustep L6 UE/LE x 5 min while taking subjective Seated leg press 55# bil x 20, single leg 45# x 15  Rec bike full revolutions - min hip hike x 4 min Passive hip flexor stretch  Prone quad stretch with strap 3 x 30 sec  Supine hamstring stretch  with strap Knee ext 5# bilat with right eccentric Knee flex 20# bilat with right eccentric   Manual Therapy: Supine AP mobs to R knee in  flexed position/ strap asssited knee flexion,  massage to VMO Modalities: ice pack 10 minutes right knee  [facility] Adult PT Treatment:                                                DATE: 03/10/2021 Therapeutic Exercise: Nustep L6 UE/LE x 5 min while taking subjective Seated leg press 45# bil x 20, single leg 45# x 15  Rec bike full revolutions - min hip hike x 4 min Step stretch for knee flexion 3 x 30" Step hamstring stretch 3 x 30" Supine strap hamstring stretch 2 x 30" Manual Therapy: Supine AP mobs to R knee in  flexed position/ strap asssited knee flexion,  massage to VMO Modalities: ice pack 10 minutes right knee    [facility] Adult PT Treatment:  DATE: 03/08/21 Therapeutic Exercise: Nustep L6 UE/LE x 5 min while taking subjective Seated leg press 45# bil x 20, single leg 45# x 15  Rec bike full revolutions - min hip hike x 4 min Step stretch for knee flexion Passive hip fleor stretch Passive knee flexion Patella mobs Hamstring curls with feet on ball  Bridge with calves over ball x 15 Seated hamstring curl blue band \x 20  Right SLR  Supine strap hamstring stretch   Manual Therapy:  Seated AP mobs to R knee in  flexed position/ strap asssited knee flexion with distraction,  massage to VMO Modalities: ice pack 10 minutes right knee   [facility] Adult PT Treatment:                                                DATE: 03/04/21 Therapeutic Exercise: Nustep L6 UE/LE x 5 min while taking subjective Seated leg press 35# bil x 20, single leg 35# x 15  Rec bike partial to full revolutions - min hip hike x 3 min Knee ext 5# bilat with right eccentric Knee flex 20# bilat with right eccentric  Passive hip flexor stretch  Heel slides with strap x 10 for 10 sec  Heel slide on wall R x 10 - 5" hold Supine hamstring stretch with strap Manual Therapy:  AP mobs to R knee in  flexed position massage roller to thigh Modalities: ice pack 10 minutes right knee   PATIENT EDUCATION:  Education details: continue HEP Person educated: Patient Education method: Explanation, Demonstration, and Handouts Education comprehension: verbalized understanding and returned demonstration     HOME EXERCISE PROGRAM: Access Code: [email or web address] URL: [email or web address] Date: 03/02/2021 Prepared by: Octavio Manns  Exercises Seated Heel Slide - 3-4 x daily - 7 x weekly - 2 sets - 10 reps - 10" hold Seated Quad Set - 3-4 x daily - 7 x weekly - 2 sets - 10 reps - 5" hold Seated Long Arc Quad - 3-4 x daily - 7 x weekly - 3 sets - 10 reps Supine Quadricep Sets - 3-4 x daily - 7 x weekly - 2 sets - 10 reps - 5" hold Supine  Heel Slide with Strap - 5-7 x daily - 7 x weekly - 2 sets - 10 reps - 10" hold Supine Knee Flexion Wall Slide - 1 x daily - 7 x weekly - 10 reps - 5 hold    ASSESSMENT: Clinical Impression: Patient reports MD was pleased and she will not need a manipulation. She will have to wait until 2024 for the other knee due to her job's FMLA requirements. [facility] arrives with [facility] and it was adjusted and gait training performed to decrease gait deviations.  Focus of session continued on knee flexion ROM with patient achieving 102 PROM by end of session after AP mobilizations and passive ROM. Patient continues to benefit from skilled PT services and should be progressed as able to improve functional independence.     GOALS: Goals reviewed with patient? No   SHORT TERM GOALS:   STG Name Target Date Goal status  1 Pt will be compliant and knowledgeable with initial HEP for improved comfort and carryover Baseline: initial HEP given 02/22/2021 Met  03/08/21  2 Pt will self report R knee pain no greater than 6/10 for improved comfort and functional ability Baseline: 8/10 at worst  Status: 03/08/21: 7-8/10 pain at worst  02/22/2021 ONGOING  03/08/21    LONG TERM GOALS:    LTG Name Target Date Goal status  1 Pt will improve FOTO function score to no less than 71% as proxy for functional improvement Baseline: 59% function 04/26/2021 INITIAL  2 Pt will improve R knee AROM to no less than 3-110 degrees for improved functional mobility Baseline: 15-60 degrees 04/26/2021 INITIAL  3 Pt will self report R knee pain no greater than 3/10 for improved comfort and functional ability Baseline: 8/10 at worst 04/26/2021 INITIAL  4 Pt will increase reps in 30" STS to no less than 10 without use of UE for improved balance and functional mobility Baseline: 04/26/2021 INITIAL  5 Pt will be able to amb up/down 10 stairs with reciprocal gait and no increase in pain for improved community navigation Baseline: unable 04/26/2021 INITIAL     PLAN: PT FREQUENCY: 2x/week   PT DURATION: 12 weeks   PLANNED INTERVENTIONS: Therapeutic exercises, Therapeutic activity, Neuro Muscular re-education, Balance training, Gait training, Patient/Family education, Joint mobilization, Aquatic Therapy, Dry Needling, Cryotherapy, Moist heat, Vasopneumatic device, and Manual therapy   PLAN FOR NEXT SESSION: manual to reduce medial knee pain during flexion , assess HEP response, PROM to R knee, progress as able, continue scar massage and STW , gait with SPC    Hessie Diener, PTA 03/14/21 11:38 AM Phone: [phone] Fax: [phone]

## 2021-03-17 ENCOUNTER — Ambulatory Visit: Payer: 59

## 2021-03-17 ENCOUNTER — Other Ambulatory Visit: Payer: Self-pay | Admitting: Orthopaedic Surgery

## 2021-03-17 ENCOUNTER — Other Ambulatory Visit: Payer: Self-pay

## 2021-03-17 DIAGNOSIS — M6281 Muscle weakness (generalized): Secondary | ICD-10-CM

## 2021-03-17 DIAGNOSIS — M25661 Stiffness of right knee, not elsewhere classified: Secondary | ICD-10-CM

## 2021-03-17 DIAGNOSIS — R262 Difficulty in walking, not elsewhere classified: Secondary | ICD-10-CM

## 2021-03-17 MED ORDER — ZOLPIDEM TARTRATE ER 12.5 MG PO TBCR: 12.5000 mg | EXTENDED_RELEASE_TABLET | Freq: Every evening | ORAL | 0 refills | Status: DC | PRN

## 2021-03-17 MED ORDER — HYDROCODONE-ACETAMINOPHEN 7.5-325 MG PO TABS: 1.0000 | ORAL_TABLET | Freq: Four times a day (QID) | ORAL | 0 refills | Status: DC | PRN

## 2021-03-17 NOTE — Therapy (Signed)
OUTPATIENT PHYSICAL THERAPY TREATMENT NOTE   Patient Name: Kayla Larsen MRN: [id number] DOB:[birth year], 51 y.o., female Today's Date: 03/17/2021  PCP: Carlena Hurl, PA-C REFERRING PROVIDER: [name]*   PT End of Session - 03/17/21 1052     Visit Number 12    Number of Visits 21    Date for PT Re-Evaluation 04/26/21    Authorization Type [employer]    PT Start Time 1052    PT Stop Time 1130    PT Time Calculation (min) 38 min                  Past Medical History:  Diagnosis Date   Acanthosis nigricans    Anemia    Arthritis of knee    External hemorrhoid    History of blood transfusion 2009   2 units after hysterectomy   Hypertension    Obesity    Plantar fasciitis of left foot    Sleep apnea 2010   borderline no cpap used   Wears glasses    Past Surgical History:  Procedure Laterality Date   CESAREAN SECTION     x 2   COLONOSCOPY  2009   done for anemia eval; Dr. Benson [place]   COLONOSCOPY  01/25/2018   multiple aphthous ulcers in terminal ileum likely d/t NSAIDs, diverticulosis in sigmoid colon, Dr. [place] Cellar   KNEE SURGERY  10/2015   knot removed from R knee   LAPAROTOMY Left 07/30/2012   Procedure: LEFT SALPINGO OOPHERECTOMY,  OMENTECTOMY, AND LYSIS OF ADHESIONS;  Surgeon: Alvino Chapel, MD;  Location: [facility];  Service: Gynecology;  Laterality: Left;   OOPHORECTOMY  2014   left    TOTAL ABDOMINAL HYSTERECTOMY  2009   TOTAL KNEE ARTHROPLASTY Right 01/14/2021   Procedure: Right TOTAL KNEE ARTHROPLASTY;  Surgeon: [name], MD;  Location: [facility];  Service: Orthopedics;  Laterality: Right;  RNFA   Patient Active Problem List   Diagnosis Date Noted   Status post total right knee replacement 01/16/2021   Status post right knee replacement 01/14/2021   Encounter for health maintenance examination in adult 12/27/2020   Screening for heart disease 12/27/2020   History of gout 12/27/2020   Iron deficiency  12/27/2020   Screening for diabetes mellitus 12/27/2020   BMI 38.0-38.9,adult 12/27/2020   OAB (overactive bladder) 12/27/2020   Hypokalemia 12/27/2020   Primary osteoarthritis of right knee 11/16/2020   H/O: hysterectomy 09/21/2020   Vaccine counseling 11/20/2019   Encounter for screening mammogram for malignant neoplasm of breast 11/20/2019   Gout of foot 11/20/2019   Encounter for hepatitis C screening test for low risk patient 11/20/2019   Primary osteoarthritis of both knees 05/14/2019   Diverticulosis 11/19/2018   Vitamin D deficiency 11/24/2016   S/P right knee arthroscopy 12/13/2015   Obesity with serious comorbidity 05/03/2015   Overactive bladder 05/03/2015   Essential hypertension 05/03/2015   Routine general medical examination at a health care facility 05/03/2015   Arthritis, senescent 05/03/2015   Hypertriglyceridemia 05/03/2015   Impaired fasting blood sugar 05/03/2015   PCP: Carlena Hurl, PA-C   REFERRING PROVIDER: [name]*   REFERRING DIAG: R44.315 (ICD-10-CM) - Status post total right knee replacement    THERAPY DIAG:  Muscle weakness (generalized)  Difficulty in walking, not elsewhere classified  Stiffness of right knee, not elsewhere classified  PERTINENT HISTORY: Pt is a 51 y/o F s/p R TKA performed by Dr. Ninfa Linden on 01/14/2021   ONSET DATE:  DOS: 01/14/2021  PRECAUTIONS: None  SUBJECTIVE:  Pt presents to PT with reports of continued lateral R knee pain. She has been compliant with her HEP with no adverse effect. Pt is ready to begin PT treatment at this time.   Pain: Are you having pain? Yes NPRS: 5/10 R knee; 7/10 L knee Pain location: R medial knee PAIN TYPE: sharp and tight Pain description: intermittent  Aggravating factors: touch, bending forward Relieving factors: ice, compression   OBJECTIVE:    PATIENT SURVEYS:  FOTO 59% function; 71% predicted  03/10/2021: 61%   COGNITION:          Overall cognitive status:  Within functional limits for tasks assessed                        SENSATION:          Light touch: Appears intact   POSTURE:  Medium body habitus, L knee varus, increased swelling noted in R knee   PALPATION: TTP to R medial knee   LE AROM/PROM:   A/PROM Right 02/01/2021 Right 02/09/21 Right 02/16/21 Right 02/23/2021 Right  02/25/21 Right 03/04/21 Right  03/08/21 Right 03/10/21 Right 03/17/2021  Knee flexion 60 73 AAROM 82 AAROM 90 AAROM 94 AAROM 92 AAROm-supine and feet on wall  95 AAROM strap 100 AAROM 101 PROM  Knee extension 15   5        (Blank rows = not tested)     LOWER EXTREMITY SPECIAL TESTS:  N/A   FUNCTIONAL TESTS:  30"STS: 8 reps with UE assist   GAIT: Distance walked: 32f Assistive device utilized: WEnvironmental consultant- 2 wheeled Level of assistance: Modified independence Comments: antalgic gait, decreased R knee flexion       TODAY'S TREATMENT: [facility] Adult PT Treatment:                                                DATE: 03/17/2021 Therapeutic Exercise: Nustep L6 UE/LE x 3 min while taking subjective Seated leg press 55# bil 3x10  Single leg 45# x 15 R Rec bike full revolutions - min hip hike x 2.5 min R SLR 3x10 2.5# Long sitting heel slide x 10 - 5" R Supine hamstring stretch with strap 3x30" Manual Therapy: Massage to distal hamstring tendons PROM into R knee flexion in supine, R LE hanging off table Modalities:  Ice pack 10 minutes right knee  [facility] Adult PT Treatment:                                                DATE: 03/14/2021 Therapeutic Exercise: Nustep L6 UE/LE x 5 min while taking subjective Seated leg press 55# bil x 20, single leg 45# x 15  Rec bike full revolutions - min hip hike x 4 min Passive hip flexor stretch  Prone quad stretch with strap 3 x 30 sec  Supine hamstring stretch  with strap Knee ext 5# bilat with right eccentric Knee flex 20# bilat with right eccentric Manual Therapy: Supine AP mobs to R knee in  flexed position/ strap  asssited knee flexion,  massage to VMO Modalities: ice pack 10 minutes right knee  [facility] Adult PT Treatment:  DATE: 03/10/2021 Therapeutic Exercise: Nustep L6 UE/LE x 5 min while taking subjective Seated leg press 45# bil x 20, single leg 45# x 15  Rec bike full revolutions - min hip hike x 4 min Step stretch for knee flexion 3 x 30" Step hamstring stretch 3 x 30" Supine strap hamstring stretch 2 x 30" Manual Therapy: Supine AP mobs to R knee in  flexed position/ strap asssited knee flexion,  massage to VMO Modalities: ice pack 10 minutes right knee   PATIENT EDUCATION:  Education details: continue HEP Person educated: Patient Education method: Explanation, Demonstration, and Handouts Education comprehension: verbalized understanding and returned demonstration     HOME EXERCISE PROGRAM: Access Code: [email or web address] URL: [email or web address] Date: 03/02/2021 Prepared by: Octavio Manns  Exercises Seated Heel Slide - 3-4 x daily - 7 x weekly - 2 sets - 10 reps - 10" hold Seated Quad Set - 3-4 x daily - 7 x weekly - 2 sets - 10 reps - 5" hold Seated Long Arc Quad - 3-4 x daily - 7 x weekly - 3 sets - 10 reps Supine Quadricep Sets - 3-4 x daily - 7 x weekly - 2 sets - 10 reps - 5" hold Supine Heel Slide with Strap - 5-7 x daily - 7 x weekly - 2 sets - 10 reps - 10" hold Supine Knee Flexion Wall Slide - 1 x daily - 7 x weekly - 10 reps - 5 hold    ASSESSMENT: Pt was able to complete all prescribed exercises with no adverse effect or increase in pain. Therapy today focused on improving R knee strength and ROM in order to improve mobility and decrease pain. Pt continues to benefit from skilled PT services for improving mobility post surgery. Will continue to progress as tolerated per POC.    GOALS: Goals reviewed with patient? No   SHORT TERM GOALS:   STG Name Target Date Goal status  1 Pt will be compliant and knowledgeable  with initial HEP for improved comfort and carryover Baseline: initial HEP given 02/22/2021 Met  03/08/21  2 Pt will self report R knee pain no greater than 6/10 for improved comfort and functional ability Baseline: 8/10 at worst Status: 03/08/21: 7-8/10 pain at worst  02/22/2021 ONGOING  03/08/21    LONG TERM GOALS:    LTG Name Target Date Goal status  1 Pt will improve FOTO function score to no less than 71% as proxy for functional improvement Baseline: 59% function 04/26/2021 INITIAL  2 Pt will improve R knee AROM to no less than 3-110 degrees for improved functional mobility Baseline: 15-60 degrees 04/26/2021 INITIAL  3 Pt will self report R knee pain no greater than 3/10 for improved comfort and functional ability Baseline: 8/10 at worst 04/26/2021 INITIAL  4 Pt will increase reps in 30" STS to no less than 10 without use of UE for improved balance and functional mobility Baseline: 04/26/2021 INITIAL  5 Pt will be able to amb up/down 10 stairs with reciprocal gait and no increase in pain for improved community navigation Baseline: unable 04/26/2021 INITIAL    PLAN: PT FREQUENCY: 2x/week   PT DURATION: 12 weeks   PLANNED INTERVENTIONS: Therapeutic exercises, Therapeutic activity, Neuro Muscular re-education, Balance training, Gait training, Patient/Family education, Joint mobilization, Aquatic Therapy, Dry Needling, Cryotherapy, Moist heat, Vasopneumatic device, and Manual therapy   PLAN FOR NEXT SESSION: manual to reduce medial knee pain during flexion , assess HEP response, PROM to R knee, progress as able, continue scar  massage and STW , gait with SPC    Ward Chatters, PT 03/17/21 1:22 PM

## 2021-03-21 ENCOUNTER — Encounter: Payer: Self-pay | Admitting: Physical Therapy

## 2021-03-21 ENCOUNTER — Ambulatory Visit: Payer: 59 | Admitting: Physical Therapy

## 2021-03-21 ENCOUNTER — Other Ambulatory Visit: Payer: Self-pay | Admitting: Medical

## 2021-03-21 ENCOUNTER — Other Ambulatory Visit: Payer: Self-pay

## 2021-03-21 DIAGNOSIS — R262 Difficulty in walking, not elsewhere classified: Secondary | ICD-10-CM

## 2021-03-21 DIAGNOSIS — M6281 Muscle weakness (generalized): Secondary | ICD-10-CM | POA: Diagnosis not present

## 2021-03-21 DIAGNOSIS — M25661 Stiffness of right knee, not elsewhere classified: Secondary | ICD-10-CM

## 2021-03-21 NOTE — Therapy (Signed)
OUTPATIENT PHYSICAL THERAPY TREATMENT NOTE   Patient Name: Kayla Larsen MRN: [id number] DOB:1970/02/17, 51 y.o., female Today's Date: 03/21/2021  PCP: Carlena Hurl, PA-C REFERRING PROVIDER: Carlena Hurl, PA-C   PT End of Session - 03/21/21 1104     Visit Number 13    Number of Visits 21    Date for PT Re-Evaluation 04/26/21    Authorization Type [employer]    PT Start Time 1100    PT Stop Time 1155    PT Time Calculation (min) 55 min                  Past Medical History:  Diagnosis Date   Acanthosis nigricans    Anemia    Arthritis of knee    External hemorrhoid    History of blood transfusion 2009   2 units after hysterectomy   Hypertension    Obesity    Plantar fasciitis of left foot    Sleep apnea 2010   borderline no cpap used   Wears glasses    Past Surgical History:  Procedure Laterality Date   CESAREAN SECTION     x 2   COLONOSCOPY  2009   done for anemia eval; Dr. Benson [place]   COLONOSCOPY  01/25/2018   multiple aphthous ulcers in terminal ileum likely d/t NSAIDs, diverticulosis in sigmoid colon, Dr. [place] Cellar   KNEE SURGERY  10/2015   knot removed from R knee   LAPAROTOMY Left 07/30/2012   Procedure: LEFT SALPINGO OOPHERECTOMY,  OMENTECTOMY, AND LYSIS OF ADHESIONS;  Surgeon: Alvino Chapel, MD;  Location: [facility];  Service: Gynecology;  Laterality: Left;   OOPHORECTOMY  2014   left    TOTAL ABDOMINAL HYSTERECTOMY  2009   TOTAL KNEE ARTHROPLASTY Right 01/14/2021   Procedure: Right TOTAL KNEE ARTHROPLASTY;  Surgeon: [name], MD;  Location: [facility];  Service: Orthopedics;  Laterality: Right;  RNFA   Patient Active Problem List   Diagnosis Date Noted   Status post total right knee replacement 01/16/2021   Status post right knee replacement 01/14/2021   Encounter for health maintenance examination in adult 12/27/2020   Screening for heart disease 12/27/2020   History of gout 12/27/2020   Iron deficiency  12/27/2020   Screening for diabetes mellitus 12/27/2020   BMI 38.0-38.9,adult 12/27/2020   OAB (overactive bladder) 12/27/2020   Hypokalemia 12/27/2020   Primary osteoarthritis of right knee 11/16/2020   H/O: hysterectomy 09/21/2020   Vaccine counseling 11/20/2019   Encounter for screening mammogram for malignant neoplasm of breast 11/20/2019   Gout of foot 11/20/2019   Encounter for hepatitis C screening test for low risk patient 11/20/2019   Primary osteoarthritis of both knees 05/14/2019   Diverticulosis 11/19/2018   Vitamin D deficiency 11/24/2016   S/P right knee arthroscopy 12/13/2015   Obesity with serious comorbidity 05/03/2015   Overactive bladder 05/03/2015   Essential hypertension 05/03/2015   Routine general medical examination at a health care facility 05/03/2015   Arthritis, senescent 05/03/2015   Hypertriglyceridemia 05/03/2015   Impaired fasting blood sugar 05/03/2015   PCP: Carlena Hurl, PA-C   REFERRING PROVIDER: [name]*   REFERRING DIAG: N82.956 (ICD-10-CM) - Status post total right knee replacement    THERAPY DIAG:  Muscle weakness (generalized)  Difficulty in walking, not elsewhere classified  Stiffness of right knee, not elsewhere classified  PERTINENT HISTORY: Pt is a 51 y/o F s/p R TKA performed by Dr. Ninfa Linden on 01/14/2021   ONSET  DATE: DOS: 01/14/2021  PRECAUTIONS: None  SUBJECTIVE:  I feel like there  is a tourniquet around my right knee today. It is tight.   Pain: Are you having pain? Yes NPRS: 6/10 R knee; 8/10 L knee Pain location: R medial knee PAIN TYPE: sharp and tight Pain description: intermittent  Aggravating factors: touch, bending forward Relieving factors: ice, compression   OBJECTIVE:    PATIENT SURVEYS:  FOTO 59% function; 71% predicted  03/10/2021: 61%   COGNITION:          Overall cognitive status: Within functional limits for tasks assessed                        SENSATION:          Light  touch: Appears intact   POSTURE:  Medium body habitus, L knee varus, increased swelling noted in R knee   PALPATION: TTP to R medial knee   LE AROM/PROM:   A/PROM Right 02/01/2021 Right 02/09/21 Right 02/16/21 Right 02/23/2021 Right  02/25/21 Right 03/04/21 Right  03/08/21 Right 03/10/21 Right 03/17/2021 Right 03/21/21  Knee flexion 60 73 AAROM 82 AAROM 90 AAROM 94 AAROM 92 AAROm-supine and feet on wall  95 AAROM strap 100 AAROM 101 PROM 97 AAROM  Knee extension 15   5         (Blank rows = not tested)     LOWER EXTREMITY SPECIAL TESTS:  N/A   FUNCTIONAL TESTS:  30"STS: 8 reps with UE assist   GAIT: Distance walked: 22f Assistive device utilized: WEnvironmental consultant- 2 wheeled Level of assistance: Modified independence Comments: antalgic gait, decreased R knee flexion       TODAY'S TREATMENT: [facility] Adult PT Treatment:                                                DATE: 03/21/2021 Therapeutic Exercise: Nustep L6 UE/LE x 5 min while taking subjective Seated leg press 55# bil 3x10  Single leg 55# x 15 R Rec bike full revolutions - min hip hike x 2.5 min R quad set Right hip flexor stretch 3 x 30 sec with strap  Seated h/s curls blue band 8 inch step up Bilat heel raises Slant board stretch  SLS 3 sec best on right  Tandem > 30 sec   Manual Therapy:  Strap mobs right knee seated EOM, PROM knee flexion Modalities:  Ice pack 10 minutes right knee  [facility] Adult PT Treatment:                                                DATE: 03/17/2021 Therapeutic Exercise: Nustep L6 UE/LE x 3 min while taking subjective Seated leg press 55# bil 3x10  Single leg 45# x 15 R Rec bike full revolutions - min hip hike x 2.5 min R SLR 3x10 2.5# Long sitting heel slide x 10 - 5" R Supine hamstring stretch with strap 3x30" Manual Therapy: Massage to distal hamstring tendons PROM into R knee flexion in supine, R LE hanging off table Modalities:  Ice pack 10 minutes right knee  [facility] Adult PT  Treatment:  DATE: 03/14/2021 Therapeutic Exercise: Nustep L6 UE/LE x 5 min while taking subjective Seated leg press 55# bil x 20, single leg 45# x 15  Rec bike full revolutions - min hip hike x 4 min Passive hip flexor stretch  Prone quad stretch with strap 3 x 30 sec  Supine hamstring stretch  with strap Knee ext 5# bilat with right eccentric Knee flex 20# bilat with right eccentric Manual Therapy: Supine AP mobs to R knee in  flexed position/ strap asssited knee flexion,  massage to VMO Modalities: ice pack 10 minutes right knee  [facility] Adult PT Treatment:                                                DATE: 03/10/2021 Therapeutic Exercise: Nustep L6 UE/LE x 5 min while taking subjective Seated leg press 45# bil x 20, single leg 45# x 15  Rec bike full revolutions - min hip hike x 4 min Step stretch for knee flexion 3 x 30" Step hamstring stretch 3 x 30" Supine strap hamstring stretch 2 x 30" Manual Therapy: Supine AP mobs to R knee in  flexed position/ strap asssited knee flexion,  massage to VMO Modalities: ice pack 10 minutes right knee   PATIENT EDUCATION:  Education details: continue HEP Person educated: Patient Education method: Explanation, Demonstration, and Handouts Education comprehension: verbalized understanding and returned demonstration     HOME EXERCISE PROGRAM: Access Code: [email or web address] URL: [email or web address] Date: 03/02/2021 Prepared by: Octavio Manns  Exercises Seated Heel Slide - 3-4 x daily - 7 x weekly - 2 sets - 10 reps - 10" hold Seated Quad Set - 3-4 x daily - 7 x weekly - 2 sets - 10 reps - 5" hold Seated Long Arc Quad - 3-4 x daily - 7 x weekly - 3 sets - 10 reps Supine Quadricep Sets - 3-4 x daily - 7 x weekly - 2 sets - 10 reps - 5" hold Supine Heel Slide with Strap - 5-7 x daily - 7 x weekly - 2 sets - 10 reps - 10" hold Supine Knee Flexion Wall Slide - 1 x daily - 7 x weekly - 10 reps -  5 hold    ASSESSMENT: Pt was able to complete all prescribed exercises with no adverse effect or increase in pain. Therapy today focused on improving R knee strength and ROM in order to improve mobility and decrease pain. SLS 3 sec best. Pt continues to benefit from skilled PT services for improving mobility post surgery. Will continue to progress as tolerated per POC.    GOALS: Goals reviewed with patient? No   SHORT TERM GOALS:   STG Name Target Date Goal status  1 Pt will be compliant and knowledgeable with initial HEP for improved comfort and carryover Baseline: initial HEP given 02/22/2021 Met  03/08/21  2 Pt will self report R knee pain no greater than 6/10 for improved comfort and functional ability Baseline: 8/10 at worst Status: 03/08/21: 7-8/10 pain at worst  02/22/2021 ONGOING  03/08/21    LONG TERM GOALS:    LTG Name Target Date Goal status  1 Pt will improve FOTO function score to no less than 71% as proxy for functional improvement Baseline: 59% function 04/26/2021 INITIAL  2 Pt will improve R knee AROM to no less than 3-110 degrees for improved functional mobility  Baseline: 15-60 degrees 04/26/2021 INITIAL  3 Pt will self report R knee pain no greater than 3/10 for improved comfort and functional ability Baseline: 8/10 at worst 04/26/2021 INITIAL  4 Pt will increase reps in 30" STS to no less than 10 without use of UE for improved balance and functional mobility Baseline: 04/26/2021 INITIAL  5 Pt will be able to amb up/down 10 stairs with reciprocal gait and no increase in pain for improved community navigation Baseline: unable 04/26/2021 INITIAL    PLAN: PT FREQUENCY: 2x/week   PT DURATION: 12 weeks   PLANNED INTERVENTIONS: Therapeutic exercises, Therapeutic activity, Neuro Muscular re-education, Balance training, Gait training, Patient/Family education, Joint mobilization, Aquatic Therapy, Dry Needling, Cryotherapy, Moist heat, Vasopneumatic device, and Manual therapy    PLAN FOR NEXT SESSION: SLS ,  manual to reduce medial knee pain during flexion , assess HEP response, PROM to R knee, progress as able, continue scar massage and STW , gait with SPC    Hessie Diener, PTA 03/21/21 11:51 AM Phone: [phone] Fax: [phone]

## 2021-03-23 ENCOUNTER — Other Ambulatory Visit: Payer: Self-pay | Admitting: Medical

## 2021-03-23 MED ORDER — DICLOFENAC SODIUM 1 % EX GEL: 1.0000 "application " | Freq: Every day | CUTANEOUS | 2 refills | Status: DC | PRN

## 2021-03-23 NOTE — Therapy (Signed)
?OUTPATIENT PHYSICAL THERAPY TREATMENT NOTE ? ? ?Patient Name: Kayla Larsen ?MRN: [id number] ?DOB:1970/03/09, 51 y.o., female ?Today's Date: 03/24/2021 ? ?PCP: Carlena Hurl, PA-C ?REFERRING PROVIDER: [name]* ? ? PT End of Session - 03/24/21 1051   ? ? Visit Number 14   ? Number of Visits 21   ? Date for PT Re-Evaluation 04/26/21   ? Authorization Type [employer]et[employer]   ? PT Start Time 1052   ? PT Stop Time 1128   ? PT Time Calculation (min) 36 min   ? ?  ?  ? ?  ? ? ? ? ? ? ? ? ?Past Medical History:  ?Diagnosis Date  ? Acanthosis nigricans   ? Anemia   ? Arthritis of knee   ? External hemorrhoid   ? History of blood transfusion 2009  ? 2 units after hysterectomy  ? Hypertension   ? Obesity   ? Plantar fasciitis of left foot   ? Sleep apnea 2010  ? borderline no cpap used  ? Wears glasses   ? ?Past Surgical History:  ?Procedure Laterality Date  ? CESAREAN SECTION    ? x 2  ? COLONOSCOPY  2009  ? done for anemia eval; Dr. Benson [place]  ? COLONOSCOPY  01/25/2018  ? multiple aphthous ulcers in terminal ileum likely d/t NSAIDs, diverticulosis in sigmoid colon, Dr. [place] Cellar  ? KNEE SURGERY  10/2015  ? knot removed from R knee  ? LAPAROTOMY Left 07/30/2012  ? Procedure: LEFT SALPINGO OOPHERECTOMY,  OMENTECTOMY, AND LYSIS OF ADHESIONS;  Surgeon: Alvino Chapel, MD;  Location: [facility];  Service: Gynecology;  Laterality: Left;  ? OOPHORECTOMY  2014  ? left   ? TOTAL ABDOMINAL HYSTERECTOMY  2009  ? TOTAL KNEE ARTHROPLASTY Right 01/14/2021  ? Procedure: Right TOTAL KNEE ARTHROPLASTY;  Surgeon: [name], MD;  Location: [facility];  Service: Orthopedics;  Laterality: Right;  RNFA  ? ?Patient Active Problem List  ? Diagnosis Date Noted  ? Status post total right knee replacement 01/16/2021  ? Status post right knee replacement 01/14/2021  ? Encounter for health maintenance examination in adult 12/27/2020  ? Screening for heart disease 12/27/2020  ? History of gout 12/27/2020  ? Iron deficiency  12/27/2020  ? Screening for diabetes mellitus 12/27/2020  ? BMI 38.0-38.9,adult 12/27/2020  ? OAB (overactive bladder) 12/27/2020  ? Hypokalemia 12/27/2020  ? Primary osteoarthritis of right knee 11/16/2020  ? H/O: hysterectomy 09/21/2020  ? Vaccine counseling 11/20/2019  ? Encounter for screening mammogram for malignant neoplasm of breast 11/20/2019  ? Gout of foot 11/20/2019  ? Encounter for hepatitis C screening test for low risk patient 11/20/2019  ? Primary osteoarthritis of both knees 05/14/2019  ? Diverticulosis 11/19/2018  ? Vitamin D deficiency 11/24/2016  ? S/P right knee arthroscopy 12/13/2015  ? Obesity with serious comorbidity 05/03/2015  ? Overactive bladder 05/03/2015  ? Essential hypertension 05/03/2015  ? Routine general medical examination at a health care facility 05/03/2015  ? Arthritis, senescent 05/03/2015  ? Hypertriglyceridemia 05/03/2015  ? Impaired fasting blood sugar 05/03/2015  ? ?PCP: Carlena Hurl, PA-C ?  ?REFERRING PROVIDER: [name]* ?  ?REFERRING DIAG: [phone] (ICD-10-CM) - Status post total right knee replacement ?  ? ?THERAPY DIAG:  ?Muscle weakness (generalized) ? ?Difficulty in walking, not elsewhere classified ? ?Stiffness of right knee, not elsewhere classified ? ?PERTINENT HISTORY: Pt is a 51 y/o F s/p R TKA performed by Dr. Ninfa Linden on 01/14/2021 ? ? ONSET  DATE: DOS: 01/14/2021 ? ?PRECAUTIONS: None ? ?SUBJECTIVE:  ?Pt presents to PT with reports of L knee pain, notes her R knee is feeling better. She is doing well with home HEP and stretching. Pt is ready to begin PT treatment at this time.  ? ?Pain: ?Are you having pain? Yes ?NPRS: 5/10 R knee; 8/10 L knee ?Pain location: R medial knee ?PAIN TYPE: sharp and tight ?Pain description: intermittent  ?Aggravating factors: touch, bending forward ?Relieving factors: ice, compression ? ? ?OBJECTIVE:  ?  ?PATIENT SURVEYS:  ?FOTO 62% function - 03/24/2021 ?  ?LE AROM/PROM: ?  ?A/PROM Right ?02/01/2021 Right ?02/09/21  Right ?02/16/21 Right ?02/23/2021 Right  ?02/25/21 Right ?03/04/21 Right  ?03/08/21 Right ?03/10/21 Right ?03/17/2021 Right ?03/21/21 Right ?03/24/2021  ?Knee flexion 60 73 AAROM 82 AAROM 90 AAROM 94 AAROM 92 AAROm-supine and feet on wall  95 AAROM ?strap 100 AAROM 101 PROM 97 AAROM 101 AAROM  ?Knee extension 15   5       4  ? (Blank rows = not tested) ?  ?  ?LOWER EXTREMITY SPECIAL TESTS:  ?N/A ?  ?FUNCTIONAL TESTS:  ?30"STS: 8 reps with UE assist ?  ?GAIT: ?Distance walked: 25ft ?Assistive device utilized: Walker - 2 wheeled ?Level of assistance: Modified independence ?Comments: antalgic gait, decreased R knee flexion ?  ?  ?  ?TODAY'S TREATMENT: ?[facility] Adult PT Treatment:                                                DATE: 03/24/2021 ?Therapeutic Exercise: ?Nustep L6 UE/LE x 3 min while taking subjective ?Mini squat at FM 2x10 ?Rec bike full revolutions - min hip hike x 3 min ?R Knee ext 2x10 10# ?R knee flex 2x10 20# ?Single leg press R 2x10 45# ?STS 2x10 - no UE support ?R quad set with towel under heel x 10 - 5" hold ?R heel slide with strap x 15 - 5" hold ?Tandem stance R back 2x30"  ?Manual Therapy: ?Strap mobs right knee seated EOM, PROM knee flexion ?Modalities:  ?Ice pack 10 minutes right knee ? ?[facility] Adult PT Treatment:                                                DATE: 03/21/2021 ?Therapeutic Exercise: ?Nustep L6 UE/LE x 5 min while taking subjective ?Seated leg press 55# bil 3x10  ?Single leg 55# x 15 R ?Rec bike full revolutions - min hip hike x 2.5 min ?R quad set ?Right hip flexor stretch 3 x 30 sec with strap  ?Seated h/s curls blue band ?8 inch step up ?Bilat heel raises ?Slant board stretch  ?SLS 3 sec best on right  ?Tandem > 30 sec  ?Manual Therapy: ?Strap mobs right knee seated EOM, PROM knee flexion ?Modalities:  ?Ice pack 10 minutes right knee ? ?[facility] Adult PT Treatment:                                                DATE: 03/21/2021 ?Therapeutic Exercise: ?Nustep L6 UE/LE x 5 min while taking  subjective ?Seated   leg press 55# bil 3x10  ?Single leg 55# x 15 R ?Rec bike full revolutions - min hip hike x 2.5 min ?R quad set ?Right hip flexor stretch 3 x 30 sec with strap  ?Seated h/s curls blue band ?8 inch step up ?Bilat heel raises ?Slant board stretch  ?SLS 3 sec best on right  ?Tandem > 30 sec  ?Manual Therapy: ?Strap mobs right knee seated EOM, PROM knee flexion ?Modalities:  ?Ice pack 10 minutes right knee ? ? ?PATIENT EDUCATION:  ?Education details: continue HEP ?Person educated: Patient ?Education method: Explanation, Demonstration, and Handouts ?Education comprehension: verbalized understanding and returned demonstration ?  ?  ?HOME EXERCISE PROGRAM: ?Access Code: [email or web address] ?URL: [email or web address] ?Date: 03/02/2021 ?Prepared by: Octavio Manns ? ?Exercises ?Seated Heel Slide - 3-4 x daily - 7 x weekly - 2 sets - 10 reps - 10" hold ?Seated Quad Set - 3-4 x daily - 7 x weekly - 2 sets - 10 reps - 5" hold ?Seated Long Arc Quad - 3-4 x daily - 7 x weekly - 3 sets - 10 reps ?Supine Quadricep Sets - 3-4 x daily - 7 x weekly - 2 sets - 10 reps - 5" hold ?Supine Heel Slide with Strap - 5-7 x daily - 7 x weekly - 2 sets - 10 reps - 10" hold ?Supine Knee Flexion Wall Slide - 1 x daily - 7 x weekly - 10 reps - 5 hold ? ?  ?ASSESSMENT: ?Pt was able to complete all prescribed exercises with no adverse effect. She continues to progress well with therapy, showing improving strength and knee ROM. Therapy today focused on improving R LE strength and functional mobility. PT will continue to progress as tolerated per POC.  ?  ?GOALS: ?Goals reviewed with patient? No ?  ?SHORT TERM GOALS: ?  ?STG Name Target Date Goal status  ?1 Pt will be compliant and knowledgeable with initial HEP for improved comfort and carryover ?Baseline: initial HEP given 02/22/2021 Met  ?03/08/21  ?2 Pt will self report R knee pain no greater than 6/10 for improved comfort and functional ability ?Baseline: 8/10 at worst ?Status:  03/08/21: 7-8/10 pain at worst  02/22/2021 ONGOING  ?03/08/21  ?  ?LONG TERM GOALS:  ?  ?LTG Name Target Date Goal status  ?1 Pt will improve FOTO function score to no less than 71% as proxy for functional improvement ?[employer]

## 2021-03-24 ENCOUNTER — Other Ambulatory Visit: Payer: Self-pay

## 2021-03-24 ENCOUNTER — Ambulatory Visit: Payer: 59

## 2021-03-24 DIAGNOSIS — M6281 Muscle weakness (generalized): Secondary | ICD-10-CM | POA: Diagnosis not present

## 2021-03-25 ENCOUNTER — Other Ambulatory Visit: Payer: Self-pay | Admitting: Orthopaedic Surgery

## 2021-03-28 ENCOUNTER — Other Ambulatory Visit: Payer: Self-pay | Admitting: Orthopaedic Surgery

## 2021-03-28 MED ORDER — HYDROCODONE-ACETAMINOPHEN 7.5-325 MG PO TABS: 1.0000 | ORAL_TABLET | Freq: Four times a day (QID) | ORAL | 0 refills | Status: DC | PRN

## 2021-03-30 ENCOUNTER — Ambulatory Visit: Payer: 59

## 2021-03-30 ENCOUNTER — Other Ambulatory Visit: Payer: Self-pay

## 2021-03-30 DIAGNOSIS — M6281 Muscle weakness (generalized): Secondary | ICD-10-CM | POA: Diagnosis not present

## 2021-03-30 DIAGNOSIS — R262 Difficulty in walking, not elsewhere classified: Secondary | ICD-10-CM

## 2021-03-30 DIAGNOSIS — M25661 Stiffness of right knee, not elsewhere classified: Secondary | ICD-10-CM

## 2021-03-30 NOTE — Therapy (Signed)
?OUTPATIENT PHYSICAL THERAPY TREATMENT NOTE ? ? ?Patient Name: Kayla Larsen ?MRN: [id number] ?DOB:1970/11/12, 51 y.o., female ?Today's Date: 03/30/2021 ? ?PCP: Carlena Hurl, PA-C ?REFERRING PROVIDER: [name]* ? ? PT End of Session - 03/30/21 1448   ? ? Visit Number 15   ? Number of Visits 21   ? Date for PT Re-Evaluation 04/26/21   ? Authorization Type Aetna   ? PT Start Time 1448   ? PT Stop Time 4782   ? PT Time Calculation (min) 39 min   ? ?  ?  ? ?  ? ? ? ? ? ? ? ? ? ?Past Medical History:  ?Diagnosis Date  ? Acanthosis nigricans   ? Anemia   ? Arthritis of knee   ? External hemorrhoid   ? History of blood transfusion 2009  ? 2 units after hysterectomy  ? Hypertension   ? Obesity   ? Plantar fasciitis of left foot   ? Sleep apnea 2010  ? borderline no cpap used  ? Wears glasses   ? ?Past Surgical History:  ?Procedure Laterality Date  ? CESAREAN SECTION    ? x 2  ? COLONOSCOPY  2009  ? done for anemia eval; Dr. Benson [place]  ? COLONOSCOPY  01/25/2018  ? multiple aphthous ulcers in terminal ileum likely d/t NSAIDs, diverticulosis in sigmoid colon, Dr. [place] Cellar  ? KNEE SURGERY  10/2015  ? knot removed from R knee  ? LAPAROTOMY Left 07/30/2012  ? Procedure: LEFT SALPINGO OOPHERECTOMY,  OMENTECTOMY, AND LYSIS OF ADHESIONS;  Surgeon: Alvino Chapel, MD;  Location: [facility];  Service: Gynecology;  Laterality: Left;  ? OOPHORECTOMY  2014  ? left   ? TOTAL ABDOMINAL HYSTERECTOMY  2009  ? TOTAL KNEE ARTHROPLASTY Right 01/14/2021  ? Procedure: Right TOTAL KNEE ARTHROPLASTY;  Surgeon: [name], MD;  Location: [facility];  Service: Orthopedics;  Laterality: Right;  RNFA  ? ?Patient Active Problem List  ? Diagnosis Date Noted  ? Status post total right knee replacement 01/16/2021  ? Status post right knee replacement 01/14/2021  ? Encounter for health maintenance examination in adult 12/27/2020  ? Screening for heart disease 12/27/2020  ? History of gout 12/27/2020  ? Iron deficiency  12/27/2020  ? Screening for diabetes mellitus 12/27/2020  ? BMI 38.0-38.9,adult 12/27/2020  ? OAB (overactive bladder) 12/27/2020  ? Hypokalemia 12/27/2020  ? Primary osteoarthritis of right knee 11/16/2020  ? H/O: hysterectomy 09/21/2020  ? Vaccine counseling 11/20/2019  ? Encounter for screening mammogram for malignant neoplasm of breast 11/20/2019  ? Gout of foot 11/20/2019  ? Encounter for hepatitis C screening test for low risk patient 11/20/2019  ? Primary osteoarthritis of both knees 05/14/2019  ? Diverticulosis 11/19/2018  ? Vitamin D deficiency 11/24/2016  ? S/P right knee arthroscopy 12/13/2015  ? Obesity with serious comorbidity 05/03/2015  ? Overactive bladder 05/03/2015  ? Essential hypertension 05/03/2015  ? Routine general medical examination at a health care facility 05/03/2015  ? Arthritis, senescent 05/03/2015  ? Hypertriglyceridemia 05/03/2015  ? Impaired fasting blood sugar 05/03/2015  ? ?PCP: Carlena Hurl, PA-C ?  ?REFERRING PROVIDER: [name]* ?  ?REFERRING DIAG: [phone] (ICD-10-CM) - Status post total right knee replacement ?  ? ?THERAPY DIAG:  ?Muscle weakness (generalized) ? ?Difficulty in walking, not elsewhere classified ? ?Stiffness of right knee, not elsewhere classified ? ?PERTINENT HISTORY: Pt is a 51 y/o F s/p R TKA performed by Dr. Ninfa Linden on 01/14/2021 ? ?  ONSET DATE: DOS: 01/14/2021 ? ?PRECAUTIONS: None ? ?SUBJECTIVE:  ?Pt presents to PT with reports of increased knee pain since last session. Has been compliant with HEP with no adverse effect. Pt is ready to begin PT at this time.  ? ?Pain: ?Are you having pain? Yes ?NPRS: 5/10 R knee; 8/10 L knee ?Pain location: R medial knee ?PAIN TYPE: sharp and tight ?Pain description: intermittent  ?Aggravating factors: touch, bending forward ?Relieving factors: ice, compression ? ? ?OBJECTIVE:  ?  ?PATIENT SURVEYS:  ?FOTO 62% function - 03/24/2021 ?  ?LE AROM/PROM: ?  ?A/PROM Right ?02/01/2021 Right ?02/09/21 Right ?02/16/21  Right ?02/23/2021 Right  ?02/25/21 Right ?03/04/21 Right  ?03/08/21 Right ?03/10/21 Right ?03/17/2021 Right ?03/21/21 Right ?03/24/2021 Right ?03/30/2021  ?Knee flexion 60 73 AAROM 82 AAROM 90 AAROM 94 AAROM 92 AAROm-supine and feet on wall  95 AAROM ?strap 100 AAROM 101 PROM 97 AAROM 101 AAROM 105 AAROM  ?Knee extension _0 ? (Blank rows = not tested) ?  ?  ?FUNCTIONAL TESTS:  ?30"STS: 12 reps with no UE support - 03/30/2021 ?  ?  ?TODAY'S TREATMENT: ?[facility] Adult PT Treatment:                                                DATE: 03/30/2021 ?Therapeutic Exercise: ?Nustep L6 UE/LE x 3 min while taking subjective ?Mini squat at FM 2x10 ?Rec bike full revolutions - min hip hike x 3 min ?R Knee ext 2x10 10# ?R knee flex 2x10 15# ?STS 2x10 - no UE support ?LAQ R 2x10 5" hold ?R heel slide on wall x 10 - 5" hold ?R quad set with half foam under heel x 10 - 5" hold ?Tandem stance R back 2x30"  ?Slant board stretch 2x30" ?Manual Therapy: ?Strap mobs right knee seated EOM, PROM knee flexion ?Modalities:  ?Ice pack 10 minutes right knee ? ?[facility] Adult PT Treatment:                                                DATE: 03/24/2021 ?Therapeutic Exercise: ?Nustep L6 UE/LE x 3 min while taking subjective ?Mini squat at FM 2x10 ?Rec bike full revolutions - min hip hike x 3 min ?R Knee ext 2x10 10# ?R knee flex 2x10 20# ?Single leg press R 2x10 45# ?STS 2x10 - no UE support ?R quad set with towel under heel x 10 - 5" hold ?R heel slide with strap x 15 - 5" hold ?Tandem stance R back 2x30"  ?Manual Therapy: ?Strap mobs right knee seated EOM, PROM knee flexion ?Modalities:  ?Ice pack 10 minutes right knee ? ?[facility] Adult PT Treatment:                                                DATE: 03/21/2021 ?Therapeutic Exercise: ?Nustep L6 UE/LE x 5 min while taking subjective ?Seated leg press 55# bil 3x10  ?Single leg 55# x 15 R ?Rec bike full revolutions - min hip hike x 2.5 min ?R quad set ?Right hip  flexor stretch 3 x 30 sec with strap  ?Seated  h/s curls blue band ?8 inch step up ?Bilat heel raises ?Slant board stretch  ?SLS 3 sec best on right  ?Tandem > 30 sec  ?Manual Therapy: ?Strap mobs right knee seated EOM, PROM knee flexion ?Modalities:  ?Ice pack 10 minutes right knee ? ? ?PATIENT EDUCATION:  ?Education details: continue HEP ?Person educated: Patient ?Education method: Explanation, Demonstration, and Handouts ?Education comprehension: verbalized understanding and returned demonstration ?  ?  ?HOME EXERCISE PROGRAM: ?Access Code: [email or web address] ?URL: [email or web address] ?Date: 03/02/2021 ?Prepared by: Octavio Manns ? ?Exercises ?Seated Heel Slide - 3-4 x daily - 7 x weekly - 2 sets - 10 reps - 10" hold ?Seated Quad Set - 3-4 x daily - 7 x weekly - 2 sets - 10 reps - 5" hold ?Seated Long Arc Quad - 3-4 x daily - 7 x weekly - 3 sets - 10 reps ?Supine Quadricep Sets - 3-4 x daily - 7 x weekly - 2 sets - 10 reps - 5" hold ?Supine Heel Slide with Strap - 5-7 x daily - 7 x weekly - 2 sets - 10 reps - 10" hold ?Supine Knee Flexion Wall Slide - 1 x daily - 7 x weekly - 10 reps - 5 hold ? ?  ?ASSESSMENT: ?Pt was able to complete all prescribed exercises with no adverse effect or increase in pain. Therapy today continued to progress LE strength and R knee ROM exercises. She continues to improve her R knee range and demonstrated improvement in functional mobility by meeting 30 Second Sit to Stand goal. She continues to benefit from skilled PT services and will continue to be seen and progressed as tolerated.  ?  ?GOALS: ?Goals reviewed with patient? No ?  ?SHORT TERM GOALS: ?  ?STG Name Target Date Goal status  ?1 Pt will be compliant and knowledgeable with initial HEP for improved comfort and carryover ?Baseline: initial HEP given 02/22/2021 Met  ?03/08/21  ?2 Pt will self report R knee pain no greater than 6/10 for improved comfort and functional ability ?Baseline: 8/10 at worst ?Status: 03/08/21: 7-8/10 pain at worst  02/22/2021 ONGOING  ?03/08/21  ?   ?LONG TERM GOALS:  ?  ?LTG Name Target Date Goal status  ?1 Pt will improve FOTO function score to no less than 71% as proxy for functional improvement ?Baseline: 59% function ?03/24/2021: 62 deg 04/26/2021 ONG

## 2021-03-31 NOTE — Therapy (Signed)
?OUTPATIENT PHYSICAL THERAPY TREATMENT NOTE ? ? ?Patient Name: Kayla Larsen ?MRN: [id number] ?DOB:October 26, 1970, 51 y.o., female ?Today's Date: 04/01/2021 ? ?PCP: Carlena Hurl, PA-C ?REFERRING PROVIDER: [name]* ? ? PT End of Session - 04/01/21 1055   ? ? Visit Number 16   ? Number of Visits 21   ? Date for PT Re-Evaluation 04/26/21   ? Authorization Type [employer]et[employer]   ? PT Start Time 1054   Pt arrived 9 minutes late to appt  ? PT Stop Time 1130   ? PT Time Calculation (min) 36 min   ? Activity Tolerance Patient tolerated treatment well   ? Behavior During Therapy [facility] for tasks assessed/performed   ? ?  ?  ? ?  ? ? ? ? ? ? ? ? ? ? ?Past Medical History:  ?Diagnosis Date  ? Acanthosis nigricans   ? Anemia   ? Arthritis of knee   ? External hemorrhoid   ? History of blood transfusion 2009  ? 2 units after hysterectomy  ? Hypertension   ? Obesity   ? Plantar fasciitis of left foot   ? Sleep apnea 2010  ? borderline no cpap used  ? Wears glasses   ? ?Past Surgical History:  ?Procedure Laterality Date  ? CESAREAN SECTION    ? x 2  ? COLONOSCOPY  2009  ? done for anemia eval; Dr. Benson Norway  ? COLONOSCOPY  01/25/2018  ? multiple aphthous ulcers in terminal ileum likely d/t NSAIDs, diverticulosis in sigmoid colon, Dr. Patmos Cellar  ? KNEE SURGERY  10/2015  ? knot removed from R knee  ? LAPAROTOMY Left 07/30/2012  ? Procedure: LEFT SALPINGO OOPHERECTOMY,  OMENTECTOMY, AND LYSIS OF ADHESIONS;  Surgeon: Alvino Chapel, MD;  Location: [facility];  Service: Gynecology;  Laterality: Left;  ? OOPHORECTOMY  2014  ? left   ? TOTAL ABDOMINAL HYSTERECTOMY  2009  ? TOTAL KNEE ARTHROPLASTY Right 01/14/2021  ? Procedure: Right TOTAL KNEE ARTHROPLASTY;  Surgeon: [name], MD;  Location: [facility];  Service: Orthopedics;  Laterality: Right;  RNFA  ? ?Patient Active Problem List  ? Diagnosis Date Noted  ? Status post total right knee replacement 01/16/2021  ? Status post right knee replacement 01/14/2021  ?  Encounter for health maintenance examination in adult 12/27/2020  ? Screening for heart disease 12/27/2020  ? History of gout 12/27/2020  ? Iron deficiency 12/27/2020  ? Screening for diabetes mellitus 12/27/2020  ? BMI 38.0-38.9,adult 12/27/2020  ? OAB (overactive bladder) 12/27/2020  ? Hypokalemia 12/27/2020  ? Primary osteoarthritis of right knee 11/16/2020  ? H/O: hysterectomy 09/21/2020  ? Vaccine counseling 11/20/2019  ? Encounter for screening mammogram for malignant neoplasm of breast 11/20/2019  ? Gout of foot 11/20/2019  ? Encounter for hepatitis C screening test for low risk patient 11/20/2019  ? Primary osteoarthritis of both knees 05/14/2019  ? Diverticulosis 11/19/2018  ? Vitamin D deficiency 11/24/2016  ? S/P right knee arthroscopy 12/13/2015  ? Obesity with serious comorbidity 05/03/2015  ? Overactive bladder 05/03/2015  ? Essential hypertension 05/03/2015  ? Routine general medical examination at a health care facility 05/03/2015  ? Arthritis, senescent 05/03/2015  ? Hypertriglyceridemia 05/03/2015  ? Impaired fasting blood sugar 05/03/2015  ? ?PCP: Carlena Hurl, PA-C ?  ?REFERRING PROVIDER: [name]* ?  ?REFERRING DIAG: [id number] (ICD-10-CM) - Status post total right knee replacement ?  ? ?THERAPY DIAG:  ?Muscle weakness (generalized) ? ?Difficulty in walking, not elsewhere classified ? ?  Stiffness of right knee, not elsewhere classified ? ?PERTINENT HISTORY: Pt is a 51 y/o F s/p R TKA performed by Dr. Ninfa Linden on 01/14/2021 ? ? ONSET DATE: DOS: 01/14/2021 ? ?PRECAUTIONS: None ? ?SUBJECTIVE: My left knee just gives out sometimes and it hurts really bad. ? ?Pain: ?Are you having pain? Yes ?NPRS: 6/10 R knee; 9/10 L knee ?Pain location: R medial knee ?PAIN TYPE: sharp and tight ?Pain description: intermittent  ?Aggravating factors: touch, bending forward ?Relieving factors: ice, compression ? ? ?OBJECTIVE:  ?  ?PATIENT SURVEYS:  ?FOTO 62% function - 03/24/2021 ?  ?LE AROM/PROM: ?  ?A/PROM  Right ?02/01/2021 Right ?02/09/21 Right ?02/16/21 Right ?02/23/2021 Right  ?02/25/21 Right ?03/04/21 Right  ?03/08/21 Right ?03/10/21 Right ?03/17/2021 Right ?03/21/21 Right ?03/24/2021 Right ?03/30/2021 Right ?04/01/2021  ?Knee flexion 60 73 AAROM 82 AAROM 90 AAROM 94 AAROM 92 AAROm-supine and feet on wall  95 AAROM ?strap 100 AAROM 101 PROM 97 AAROM 101 AAROM 105 AAROM 100 AAROM  ?Knee extension 15   5       4 4 4   ? (Blank rows = not tested) ?  ?  ?FUNCTIONAL TESTS:  ?30"STS: 12 reps with no UE support - 03/30/2021 ?  ?  ?TODAY'S TREATMENT: ?[facility] Adult PT Treatment:                                                DATE: 04/01/2021 ?Therapeutic Exercise: ?Nustep L6 UE/LE x 5 min while taking subjective ?Slant board stretch 2x30" ?Mini squat at FM 2x10 ?R Knee ext 2x10 10# ?R knee flex 2x10 15# ?STS 2x10 - no UE support ?LAQ R 2x10 5" hold ?SLR with QS R x10 ?R heel slide on wall x 10 - 5" hold ?R quad set with half foam under heel x 10 - 5" hold ?Manual Therapy: ?Strap mobs right knee seated EOM, PROM knee flexion ?Modalities:  ?Ice pack 10 minutes right knee post session pt supine leg elevated ? ? ?[facility] Adult PT Treatment:                                                DATE: 03/30/2021 ?Therapeutic Exercise: ?Nustep L6 UE/LE x 3 min while taking subjective ?Mini squat at FM 2x10 ?Rec bike full revolutions - min hip hike x 3 min ?R Knee ext 2x10 10# ?R knee flex 2x10 15# ?STS 2x10 - no UE support ?LAQ R 2x10 5" hold ?R heel slide on wall x 10 - 5" hold ?R quad set with half foam under heel x 10 - 5" hold ?Tandem stance R back 2x30"  ?Slant board stretch 2x30" ?Manual Therapy: ?Strap mobs right knee seated EOM, PROM knee flexion ?Modalities:  ?Ice pack 10 minutes right knee ? ?[facility] Adult PT Treatment:                                                DATE: 03/24/2021 ?Therapeutic Exercise: ?Nustep L6 UE/LE x 3 min while taking subjective ?Mini squat at FM 2x10 ?Rec bike full revolutions - min hip hike x 3 min ?R Knee ext  2x10 10# ?R knee  flex 2x10 20# ?Single leg press R 2x10 45# ?STS 2x10 - no UE support ?R quad set with towel under heel x 10 - 5" hold ?R heel slide with strap x 15 - 5" hold ?Tandem stance R back 2x30"  ?Manual Therapy: ?Strap mobs right knee seated EOM, PROM knee flexion ?Modalities:  ?Ice pack 10 minutes right knee ? ? ?PATIENT EDUCATION:  ?Education details: continue HEP ?Person educated: Patient ?Education method: Explanation, Demonstration, and Handouts ?Education comprehension: verbalized understanding and returned demonstration ?  ?  ?HOME EXERCISE PROGRAM: ?Access Code: [email or web address] ?URL: [email or web address] ?Date: 03/02/2021 ?Prepared by: Octavio Manns ? ?Exercises ?Seated Heel Slide - 3-4 x daily - 7 x weekly - 2 sets - 10 reps - 10" hold ?Seated Quad Set - 3-4 x daily - 7 x weekly - 2 sets - 10 reps - 5" hold ?Seated Long Arc Quad - 3-4 x daily - 7 x weekly - 3 sets - 10 reps ?Supine Quadricep Sets - 3-4 x daily - 7 x weekly - 2 sets - 10 reps - 5" hold ?Supine Heel Slide with Strap - 5-7 x daily - 7 x weekly - 2 sets - 10 reps - 10" hold ?Supine Knee Flexion Wall Slide - 1 x daily - 7 x weekly - 10 reps - 5 hold ? ?  ?ASSESSMENT: ?Patient presents to PT with high levels of pain in bilateral knees and reports HEP compliance. Session today focused on bilateral lower extremity strengthening and R knee ROM. She was able to tolerate all prescribed exercises with no adverse effects. Patient continues to benefit from skilled PT services and should be progressed as able to improve functional independence. ? ?  ?GOALS: ?Goals reviewed with patient? No ?  ?SHORT TERM GOALS: ?  ?STG Name Target Date Goal status  ?1 Pt will be compliant and knowledgeable with initial HEP for improved comfort and carryover ?Baseline: initial HEP given 02/22/2021 Met  ?03/08/21  ?2 Pt will self report R knee pain no greater than 6/10 for improved comfort and functional ability ?Baseline: 8/10 at worst ?Status: 03/08/21: 7-8/10 pain at worst   02/22/2021 ONGOING  ?03/08/21  ?  ?LONG TERM GOALS:  ?  ?LTG Name Target Date Goal status  ?1 Pt will improve FOTO function score to no less than 71% as proxy for functional improvement ?Baseline: 59% functio

## 2021-04-01 ENCOUNTER — Ambulatory Visit: Payer: 59

## 2021-04-01 ENCOUNTER — Other Ambulatory Visit: Payer: Self-pay

## 2021-04-01 DIAGNOSIS — M6281 Muscle weakness (generalized): Secondary | ICD-10-CM | POA: Diagnosis not present

## 2021-04-01 DIAGNOSIS — R262 Difficulty in walking, not elsewhere classified: Secondary | ICD-10-CM

## 2021-04-01 DIAGNOSIS — M25661 Stiffness of right knee, not elsewhere classified: Secondary | ICD-10-CM

## 2021-04-02 ENCOUNTER — Other Ambulatory Visit: Payer: Self-pay | Admitting: Medical

## 2021-04-04 ENCOUNTER — Other Ambulatory Visit: Payer: Self-pay

## 2021-04-04 ENCOUNTER — Ambulatory Visit: Payer: 59

## 2021-04-04 DIAGNOSIS — M25661 Stiffness of right knee, not elsewhere classified: Secondary | ICD-10-CM

## 2021-04-04 DIAGNOSIS — M6281 Muscle weakness (generalized): Secondary | ICD-10-CM

## 2021-04-04 DIAGNOSIS — R262 Difficulty in walking, not elsewhere classified: Secondary | ICD-10-CM

## 2021-04-04 NOTE — Therapy (Signed)
?OUTPATIENT PHYSICAL THERAPY TREATMENT NOTE ? ? ?Patient Name: Kayla Larsen ?MRN: [id number] ?DOB:[birth year], 51 y.o., female ?Today's Date: 04/04/2021 ? ?PCP: Carlena Hurl, PA-C ?REFERRING PROVIDER: [name]* ? ? PT End of Session - 04/04/21 1047   ? ? Visit Number 17   ? Number of Visits 21   ? Date for PT Re-Evaluation 04/26/21   ? Authorization Type Aetna   ? PT Start Time 1047   ? PT Stop Time 1128   ? PT Time Calculation (min) 41 min   ? Activity Tolerance Patient tolerated treatment well   ? Behavior During Therapy [facility] for tasks assessed/performed   ? ?  ?  ? ?  ? ? ? ? ? ? ? ? ? ? ? ?Past Medical History:  ?Diagnosis Date  ? Acanthosis nigricans   ? Anemia   ? Arthritis of knee   ? External hemorrhoid   ? History of blood transfusion 2009  ? 2 units after hysterectomy  ? Hypertension   ? Obesity   ? Plantar fasciitis of left foot   ? Sleep apnea 2010  ? borderline no cpap used  ? Wears glasses   ? ?Past Surgical History:  ?Procedure Laterality Date  ? CESAREAN SECTION    ? x 2  ? COLONOSCOPY  2009  ? done for anemia eval; Dr. Benson Norway  ? COLONOSCOPY  01/25/2018  ? multiple aphthous ulcers in terminal ileum likely d/t NSAIDs, diverticulosis in sigmoid colon, Dr. [place] Cellar  ? KNEE SURGERY  10/2015  ? knot removed from R knee  ? LAPAROTOMY Left 07/30/2012  ? Procedure: LEFT SALPINGO OOPHERECTOMY,  OMENTECTOMY, AND LYSIS OF ADHESIONS;  Surgeon: Alvino Chapel, MD;  Location: [facility];  Service: Gynecology;  Laterality: Left;  ? OOPHORECTOMY  2014  ? left   ? TOTAL ABDOMINAL HYSTERECTOMY  2009  ? TOTAL KNEE ARTHROPLASTY Right 01/14/2021  ? Procedure: Right TOTAL KNEE ARTHROPLASTY;  Surgeon: [name], MD;  Location: [facility];  Service: Orthopedics;  Laterality: Right;  RNFA  ? ?Patient Active Problem List  ? Diagnosis Date Noted  ? Status post total right knee replacement 01/16/2021  ? Status post right knee replacement 01/14/2021  ? Encounter for health maintenance  examination in adult 12/27/2020  ? Screening for heart disease 12/27/2020  ? History of gout 12/27/2020  ? Iron deficiency 12/27/2020  ? Screening for diabetes mellitus 12/27/2020  ? BMI 38.0-38.9,adult 12/27/2020  ? OAB (overactive bladder) 12/27/2020  ? Hypokalemia 12/27/2020  ? Primary osteoarthritis of right knee 11/16/2020  ? H/O: hysterectomy 09/21/2020  ? Vaccine counseling 11/20/2019  ? Encounter for screening mammogram for malignant neoplasm of breast 11/20/2019  ? Gout of foot 11/20/2019  ? Encounter for hepatitis C screening test for low risk patient 11/20/2019  ? Primary osteoarthritis of both knees 05/14/2019  ? Diverticulosis 11/19/2018  ? Vitamin D deficiency 11/24/2016  ? S/P right knee arthroscopy 12/13/2015  ? Obesity with serious comorbidity 05/03/2015  ? Overactive bladder 05/03/2015  ? Essential hypertension 05/03/2015  ? Routine general medical examination at a health care facility 05/03/2015  ? Arthritis, senescent 05/03/2015  ? Hypertriglyceridemia 05/03/2015  ? Impaired fasting blood sugar 05/03/2015  ? ?PCP: Carlena Hurl, PA-C ?  ?REFERRING PROVIDER: [name]* ?  ?REFERRING DIAG: [phone] (ICD-10-CM) - Status post total right knee replacement ?  ? ?THERAPY DIAG:  ?Muscle weakness (generalized) ? ?Difficulty in walking, not elsewhere classified ? ?Stiffness of right knee, not elsewhere  classified ? ?PERTINENT HISTORY: Pt is a 51 y/o F s/p R TKA performed by Dr. Ninfa Linden on 01/14/2021 ? ? ONSET DATE: DOS: 01/14/2021 ? ?PRECAUTIONS: None ? ?SUBJECTIVE:  ?Pt presents to PT with continued decrease in R knee pain. She notes that her L knee continues to bother her more. Pt is ready to begin PT a this time.  ? ?Pain: ?Are you having pain? Yes ?NPRS: 5/10 R knee; 9/10 L knee ?Pain location: R medial knee ?PAIN TYPE: sharp and tight ?Pain description: intermittent  ?Aggravating factors: touch, bending forward ?Relieving factors: ice, compression ? ? ?OBJECTIVE:  ?  ?PATIENT SURVEYS:   ?FOTO 62% function - 03/24/2021 ?  ?LE AROM/PROM: ?  ?A/PROM Right ?03/04/21 Right  ?03/08/21 Right ?03/10/21 Right ?03/17/2021 Right ?03/21/21 Right ?03/24/2021 Right ?03/30/2021 Right ?04/01/2021 Right ?04/04/2021  ?Knee flexion 92 AAROm-supine and feet on wall  95 AAROM ?strap 100 AAROM 101 PROM 97 AAROM 101 AAROM 105 AAROM 100 AAROM 105 AAROM  ?Knee extension      _0 ? (Blank rows = not tested) ?  ?  ?FUNCTIONAL TESTS:  ?30"STS: 12 reps with no UE support - 03/30/2021 ?  ?  ?TODAY'S TREATMENT: ?[facility] Adult PT Treatment:                                                DATE: 04/04/2021 ?Therapeutic Exercise: ?Rec bike x 4 min while taking subjective for increase knee flexion ?Slant board stretch 2x30" ?Mini squat at FM 2x15 ?Step up 6in fwd x 10 R leading ?R Knee ext 2x10 10# ?R knee flex 2x10 20# ?R heel slide on wall x 10 ?Supine SLR 2x10 2# R ?Supine hamstring stretch with strap 2x30" R ?R quad set with half foam under heel x 10 - 5" hold ?Modalities:  ?Ice pack 10 minutes right knee post session pt supine leg elevated ? ?[facility] Adult PT Treatment:                                                DATE: 04/01/2021 ?Therapeutic Exercise: ?Nustep L6 UE/LE x 5 min while taking subjective ?Slant board stretch 2x30" ?Mini squat at FM 2x10 ?R Knee ext 2x10 10# ?R knee flex 2x10 15# ?STS 2x10 - no UE support ?LAQ R 2x10 5" hold ?SLR with QS R x10 ?R heel slide on wall x 10 - 5" hold ?R quad set with half foam under heel x 10 - 5" hold ?Manual Therapy: ?Strap mobs right knee seated EOM, PROM knee flexion ?Modalities:  ?Ice pack 10 minutes right knee post session pt supine leg elevated ? ? ?PATIENT EDUCATION:  ?Education details: continue HEP ?Person educated: Patient ?Education method: Explanation, Demonstration, and Handouts ?Education comprehension: verbalized understanding and returned demonstration ?  ?  ?HOME EXERCISE PROGRAM: ?Access Code: [email or web address] ?URL: [email or web address] ?Date: 03/02/2021 ?Prepared by: Octavio Manns ? ?Exercises ?Seated Heel Slide - 3-4 x daily - 7 x weekly - 2 sets - 10 reps - 10" hold ?Seated Quad Set - 3-4 x daily - 7 x weekly - 2 sets - 10 reps - 5" hold ?Seated Long Arc Quad - 3-4 x daily - 7 x weekly - 3 sets - 10  reps ?Supine Quadricep Sets - 3-4 x daily - 7 x weekly - 2 sets - 10 reps - 5" hold ?Supine Heel Slide with Strap - 5-7 x daily - 7 x weekly - 2 sets - 10 reps - 10" hold ?Supine Knee Flexion Wall Slide - 1 x daily - 7 x weekly - 10 reps - 5 hold ? ?  ?ASSESSMENT: ?Pt again was able to complete all prescribed exercises with no adverse effect. Therapy today continue to focus on improving R knee strength and ROM post TKA surgery. She continues to slowly improve R knee range and mobility, with continued independence in gait without AD. Pt continues to require skilled PT services working towards normalizing range and strength post surgery. Will continue to progress as able.  ? ?  ?GOALS: ?Goals reviewed with patient? No ?  ?SHORT TERM GOALS: ?  ?STG Name Target Date Goal status  ?1 Pt will be compliant and knowledgeable with initial HEP for improved comfort and carryover ?Baseline: initial HEP given 02/22/2021 Met  ?03/08/21  ?2 Pt will self report R knee pain no greater than 6/10 for improved comfort and functional ability ?Baseline: 8/10 at worst ?Status: 03/08/21: 7-8/10 pain at worst  02/22/2021 ONGOING  ?03/08/21  ?  ?LONG TERM GOALS:  ?  ?LTG Name Target Date Goal status  ?1 Pt will improve FOTO function score to no less than 71% as proxy for functional improvement ?Baseline: 59% function ?03/24/2021: 62 deg 04/26/2021 ONGOING  ?2 Pt will improve R knee AROM to no less than 3-110 degrees for improved functional mobility ?Baseline: 15-60 degrees 04/26/2021 ONGOING  ?3 Pt will self report R knee pain no greater than 3/10 for improved comfort and functional ability ?Baseline: 8/10 at worst 04/26/2021 ONGOING  ?4 Pt will increase reps in 30" STS to no less than 10 without use of UE for improved  balance and functional mobility ?Baseline: 8 reps ?03/30/2021: 12 reps 04/26/2021 MET  ?5 Pt will be able to amb up/down 10 stairs with reciprocal gait and no increase in pain for improved community navigation ?Clarise Cruz

## 2021-04-06 ENCOUNTER — Ambulatory Visit: Payer: 59

## 2021-04-06 ENCOUNTER — Other Ambulatory Visit: Payer: Self-pay

## 2021-04-06 DIAGNOSIS — R262 Difficulty in walking, not elsewhere classified: Secondary | ICD-10-CM

## 2021-04-06 DIAGNOSIS — M6281 Muscle weakness (generalized): Secondary | ICD-10-CM

## 2021-04-06 DIAGNOSIS — M25661 Stiffness of right knee, not elsewhere classified: Secondary | ICD-10-CM

## 2021-04-06 NOTE — Therapy (Signed)
?OUTPATIENT PHYSICAL THERAPY TREATMENT NOTE ? ? ?Patient Name: Kayla Larsen ?MRN: [id number] ?DOB:08-03-1970, 51 y.o., female ?Today's Date: 04/06/2021 ? ?PCP: Carlena Hurl, PA-C ?REFERRING PROVIDER: [name]* ? ? PT End of Session - 04/06/21 1129   ? ? Visit Number 18   ? Number of Visits 21   ? Date for PT Re-Evaluation 04/26/21   ? Authorization Type Aetna   ? PT Start Time 1130   ? PT Stop Time 1210   ? PT Time Calculation (min) 40 min   ? Activity Tolerance Patient tolerated treatment well   ? Behavior During Therapy [facility] for tasks assessed/performed   ? ?  ?  ? ?  ? ? ? ? ? ? ? ? ? ? ? ? ?Past Medical History:  ?Diagnosis Date  ? Acanthosis nigricans   ? Anemia   ? Arthritis of knee   ? External hemorrhoid   ? History of blood transfusion 2009  ? 2 units after hysterectomy  ? Hypertension   ? Obesity   ? Plantar fasciitis of left foot   ? Sleep apnea 2010  ? borderline no cpap used  ? Wears glasses   ? ?Past Surgical History:  ?Procedure Laterality Date  ? CESAREAN SECTION    ? x 2  ? COLONOSCOPY  2009  ? done for anemia eval; Dr. Benson Norway  ? COLONOSCOPY  01/25/2018  ? multiple aphthous ulcers in terminal ileum likely d/t NSAIDs, diverticulosis in sigmoid colon, Dr. Byng Cellar  ? KNEE SURGERY  10/2015  ? knot removed from R knee  ? LAPAROTOMY Left 07/30/2012  ? Procedure: LEFT SALPINGO OOPHERECTOMY,  OMENTECTOMY, AND LYSIS OF ADHESIONS;  Surgeon: Alvino Chapel, MD;  Location: [facility];  Service: Gynecology;  Laterality: Left;  ? OOPHORECTOMY  2014  ? left   ? TOTAL ABDOMINAL HYSTERECTOMY  2009  ? TOTAL KNEE ARTHROPLASTY Right 01/14/2021  ? Procedure: Right TOTAL KNEE ARTHROPLASTY;  Surgeon: [name], MD;  Location: [facility];  Service: Orthopedics;  Laterality: Right;  RNFA  ? ?Patient Active Problem List  ? Diagnosis Date Noted  ? Status post total right knee replacement 01/16/2021  ? Status post right knee replacement 01/14/2021  ? Encounter for health maintenance  examination in adult 12/27/2020  ? Screening for heart disease 12/27/2020  ? History of gout 12/27/2020  ? Iron deficiency 12/27/2020  ? Screening for diabetes mellitus 12/27/2020  ? BMI 38.0-38.9,adult 12/27/2020  ? OAB (overactive bladder) 12/27/2020  ? Hypokalemia 12/27/2020  ? Primary osteoarthritis of right knee 11/16/2020  ? H/O: hysterectomy 09/21/2020  ? Vaccine counseling 11/20/2019  ? Encounter for screening mammogram for malignant neoplasm of breast 11/20/2019  ? Gout of foot 11/20/2019  ? Encounter for hepatitis C screening test for low risk patient 11/20/2019  ? Primary osteoarthritis of both knees 05/14/2019  ? Diverticulosis 11/19/2018  ? Vitamin D deficiency 11/24/2016  ? S/P right knee arthroscopy 12/13/2015  ? Obesity with serious comorbidity 05/03/2015  ? Overactive bladder 05/03/2015  ? Essential hypertension 05/03/2015  ? Routine general medical examination at a health care facility 05/03/2015  ? Arthritis, senescent 05/03/2015  ? Hypertriglyceridemia 05/03/2015  ? Impaired fasting blood sugar 05/03/2015  ? ?PCP: Carlena Hurl, PA-C ?  ?REFERRING PROVIDER: [name]* ?  ?REFERRING DIAG: [phone] (ICD-10-CM) - Status post total right knee replacement ?  ? ?THERAPY DIAG:  ?Muscle weakness (generalized) ? ?Difficulty in walking, not elsewhere classified ? ?Stiffness of right knee, not  elsewhere classified ? ?PERTINENT HISTORY: Pt is a 51 y/o F s/p R TKA performed by Dr. Ninfa Linden on 01/14/2021 ? ? ONSET DATE: DOS: 01/14/2021 ? ?PRECAUTIONS: None ? ?SUBJECTIVE:  ?Pt presents to PT with reports of decreasing R knee pain. Does have pain in non-surgical L knee. Pt is ready to begin PT at this time.  ? ?Pain: ?Are you having pain? Yes ?NPRS: 3/10 R knee; 9/10 L knee ?Pain location: R medial knee ?PAIN TYPE: sharp and tight ?Pain description: intermittent  ?Aggravating factors: touch, bending forward ?Relieving factors: ice, compression ? ? ?OBJECTIVE:  ?  ?PATIENT SURVEYS:  ?FOTO 62%  function - 03/24/2021 ?  ?LE AROM/PROM: ?  ?A/PROM Right ?03/04/21 Right  ?03/08/21 Right ?03/10/21 Right ?03/17/2021 Right ?03/21/21 Right ?03/24/2021 Right ?03/30/2021 Right ?04/01/2021 Right ?04/04/2021 Right ?04/06/2021  ?Knee flexion 92 AAROm-supine and feet on wall  95 AAROM ?strap 100 AAROM 101 PROM 97 AAROM 101 AAROM 105 AAROM 100 AAROM 105 AAROM 105 AAROM  ?Knee extension      _0 ? (Blank rows = not tested) ?  ?  ?FUNCTIONAL TESTS:  ?30"STS: 12 reps with no UE support - 03/30/2021 ?  ?  ?TODAY'S TREATMENT: ?[facility] Adult PT Treatment:                                                DATE: 04/06/2021 ?Therapeutic Exercise: ?Rec bike x 4 min while taking subjective for increase knee flexion ?Slant board stretch 3x30" ?Mini squat at FM 2x15 ?Step up 6in fwd x 10 R leading ?R Knee ext 3x10 10# ?R knee flex 3x10 20# ?R heel slide on wall x 5 ?Supine heel slide with strap x 5 - 10" hold ?Supine SLR 2x10 2.5# R ?Manual Therapy: ?AP mobs to R knee in supine grade III ?STM to bilateral hamstring tendons ?Modalities:  ?Ice pack 10 minutes right knee post session pt supine leg elevated ? ?[facility] Adult PT Treatment:                                                DATE: 04/04/2021 ?Therapeutic Exercise: ?Rec bike x 4 min while taking subjective for increase knee flexion ?Slant board stretch 2x30" ?Mini squat at FM 2x15 ?Step up 6in fwd x 10 R leading ?R Knee ext 2x10 10# ?R knee flex 2x10 20# ?R heel slide on wall x 10 ?Supine SLR 2x10 2# R ?Supine hamstring stretch with strap 2x30" R ?R quad set with half foam under heel x 10 - 5" hold ?Modalities:  ?Ice pack 10 minutes right knee post session pt supine leg elevated ? ?[facility] Adult PT Treatment:                                                DATE: 04/01/2021 ?Therapeutic Exercise: ?Nustep L6 UE/LE x 5 min while taking subjective ?Slant board stretch 2x30" ?Mini squat at FM 2x10 ?R Knee ext 2x10 10# ?R knee flex 2x10 15# ?STS 2x10 - no UE support ?LAQ R 2x10 5" hold ?SLR with QS R x10 ?R  heel slide on wall x 10 - 5" hold ?R quad set with half foam under heel x 10 - 5" hold ?Manual Therapy: ?Strap mobs right knee seated EOM, PROM knee flexion ?Modalities:  ?Ice pack 10 minutes right knee post session pt supine leg elevated ? ? ?PATIENT EDUCATION:  ?Education details: continue HEP ?Person educated: Patient ?Education method: Explanation, Demonstration, and Handouts ?Education comprehension: verbalized understanding and returned demonstration ?  ?  ?HOME EXERCISE PROGRAM: ?Access Code: [email or web address] ?URL: [email or web address] ?Date: 03/02/2021 ?Prepared by: Octavio Manns ? ?Exercises ?Seated Heel Slide - 3-4 x daily - 7 x weekly - 2 sets - 10 reps - 10" hold ?Seated Quad Set - 3-4 x daily - 7 x weekly - 2 sets - 10 reps - 5" hold ?Seated Long Arc Quad - 3-4 x daily - 7 x weekly - 3 sets - 10 reps ?Supine Quadricep Sets - 3-4 x daily - 7 x weekly - 2 sets - 10 reps - 5" hold ?Supine Heel Slide with Strap - 5-7 x daily - 7 x weekly - 2 sets - 10 reps - 10" hold ?Supine Knee Flexion Wall Slide - 1 x daily - 7 x weekly - 10 reps - 5 hold ? ?  ?ASSESSMENT: ?Pt was able to once again complete all prescribed exercises with no adverse effect. Pt continues to progress with therapy, has had similar ROM values the last two sessions. Pt has f/u visit with MD on 04/07/2021, will otherwise continue to progress exercises as able per POC.   ? ?  ?GOALS: ?Goals reviewed with patient? No ?  ?SHORT TERM GOALS: ?  ?STG Name Target Date Goal status  ?1 Pt will be compliant and knowledgeable with initial HEP for improved comfort and carryover ?Baseline: initial HEP given 02/22/2021 Met  ?03/08/21  ?2 Pt will self report R knee pain no greater than 6/10 for improved comfort and functional ability ?Baseline: 8/10 at worst ?Status: 03/08/21: 7-8/10 pain at worst  02/22/2021 ONGOING  ?03/08/21  ?  ?LONG TERM GOALS:  ?  ?LTG Name Target Date Goal status  ?1 Pt will improve FOTO function score to no less than 71% as proxy for  functional improvement ?Baseline: 59% function ?03/24/2021: 62 deg 04/26/2021 ONGOING  ?2 Pt will improve R knee AROM to no less than 3-110 degrees for improved functional mobility ?Baseline: 15-60 degrees 04/27/18

## 2021-04-07 ENCOUNTER — Other Ambulatory Visit: Payer: Self-pay | Admitting: Orthopaedic Surgery

## 2021-04-07 ENCOUNTER — Encounter: Payer: Self-pay | Admitting: Orthopaedic Surgery

## 2021-04-07 ENCOUNTER — Ambulatory Visit (INDEPENDENT_AMBULATORY_CARE_PROVIDER_SITE_OTHER): Payer: 59 | Admitting: Orthopaedic Surgery

## 2021-04-07 ENCOUNTER — Telehealth: Payer: Self-pay | Admitting: Orthopaedic Surgery

## 2021-04-07 DIAGNOSIS — Z96651 Presence of right artificial knee joint: Secondary | ICD-10-CM

## 2021-04-07 MED ORDER — HYDROCODONE-ACETAMINOPHEN 7.5-325 MG PO TABS: 1.0000 | ORAL_TABLET | Freq: Four times a day (QID) | ORAL | 0 refills | Status: DC | PRN

## 2021-04-07 MED ORDER — ZOLPIDEM TARTRATE ER 12.5 MG PO TBCR: 12.5000 mg | EXTENDED_RELEASE_TABLET | Freq: Every evening | ORAL | 0 refills | Status: DC | PRN

## 2021-04-07 MED ORDER — TIZANIDINE HCL 4 MG PO TABS: 4.0000 mg | ORAL_TABLET | Freq: Three times a day (TID) | ORAL | 0 refills | Status: DC | PRN

## 2021-04-07 NOTE — Telephone Encounter (Signed)
[employer] forms received. To [employer]. ?

## 2021-04-07 NOTE — Progress Notes (Signed)
The patient will be 3 months tomorrow status post a right total knee arthroplasty.  She has known severe arthritis in her left knee.  She says she is doing well overall and reports good range of motion and strength and progress with physical therapy status post a right total knee arthroplasty.  She is thinking about having her left knee replaced in January of this next year. ? ?On examination her right knee is still slightly swollen and warm to be expected at 12 weeks.  It has good range of motion which is improving and good stability. ? ?I will give her a return to work note without restrictions starting August 10.  I have counseled her about beginning to wean medications in terms of narcotics and sleep medications.  From my standpoint, I do not need to see her back for 6 months unless there are issues.  We will have an AP and lateral of her more recent operative knee at that visit. ?

## 2021-04-08 ENCOUNTER — Other Ambulatory Visit: Payer: Self-pay | Admitting: Podiatry

## 2021-04-08 DIAGNOSIS — M1A0710 Idiopathic chronic gout, right ankle and foot, without tophus (tophi): Secondary | ICD-10-CM

## 2021-04-11 ENCOUNTER — Telehealth: Payer: Self-pay | Admitting: Orthopaedic Surgery

## 2021-04-11 NOTE — Telephone Encounter (Signed)
Pt submitted extension of intermittence FMLA forms, medical release form, and $25.00 cahs payment to [employer]. Accepted 04/11/21 ?

## 2021-04-12 ENCOUNTER — Ambulatory Visit: Payer: 59

## 2021-04-12 ENCOUNTER — Ambulatory Visit: Payer: 59 | Attending: Orthopaedic Surgery | Admitting: Physical Therapy

## 2021-04-12 DIAGNOSIS — M25661 Stiffness of right knee, not elsewhere classified: Secondary | ICD-10-CM | POA: Diagnosis present

## 2021-04-12 DIAGNOSIS — R262 Difficulty in walking, not elsewhere classified: Secondary | ICD-10-CM | POA: Diagnosis present

## 2021-04-12 DIAGNOSIS — M6281 Muscle weakness (generalized): Secondary | ICD-10-CM | POA: Insufficient documentation

## 2021-04-12 NOTE — Therapy (Signed)
?OUTPATIENT PHYSICAL THERAPY TREATMENT NOTE ? ? ?Patient Name: Kayla Larsen ?MRN: [id number] ?DOB:1970-03-01, 51 y.o., female ?Today's Date: 04/12/2021 ? ?PCP: Carlena Hurl, PA-C ?REFERRING PROVIDER: Carlena Hurl, PA-C ? ? PT End of Session - 04/12/21 1157   ? ? Visit Number 19   ? Number of Visits 21   ? Date for PT Re-Evaluation 04/26/21   ? Authorization Type Aetna   ? PT Start Time 1148   ? PT Stop Time 1238   ? PT Time Calculation (min) 50 min   ? ?  ?  ? ?  ? ? ? ? ? ? ? ? ? ? ? ? ?Past Medical History:  ?Diagnosis Date  ? Acanthosis nigricans   ? Anemia   ? Arthritis of knee   ? External hemorrhoid   ? History of blood transfusion 2009  ? 2 units after hysterectomy  ? Hypertension   ? Obesity   ? Plantar fasciitis of left foot   ? Sleep apnea 2010  ? borderline no cpap used  ? Wears glasses   ? ?Past Surgical History:  ?Procedure Laterality Date  ? CESAREAN SECTION    ? x 2  ? COLONOSCOPY  2009  ? done for anemia eval; Dr. Benson Norway  ? COLONOSCOPY  01/25/2018  ? multiple aphthous ulcers in terminal ileum likely d/t NSAIDs, diverticulosis in sigmoid colon, Dr. [place] Cellar  ? KNEE SURGERY  10/2015  ? knot removed from R knee  ? LAPAROTOMY Left 07/30/2012  ? Procedure: LEFT SALPINGO OOPHERECTOMY,  OMENTECTOMY, AND LYSIS OF ADHESIONS;  Surgeon: Alvino Chapel, MD;  Location: [facility];  Service: Gynecology;  Laterality: Left;  ? OOPHORECTOMY  2014  ? left   ? TOTAL ABDOMINAL HYSTERECTOMY  2009  ? TOTAL KNEE ARTHROPLASTY Right 01/14/2021  ? Procedure: Right TOTAL KNEE ARTHROPLASTY;  Surgeon: [name], MD;  Location: [facility];  Service: Orthopedics;  Laterality: Right;  RNFA  ? ?Patient Active Problem List  ? Diagnosis Date Noted  ? Status post total right knee replacement 01/16/2021  ? Status post right knee replacement 01/14/2021  ? Encounter for health maintenance examination in adult 12/27/2020  ? Screening for heart disease 12/27/2020  ? History of gout 12/27/2020  ? Iron deficiency  12/27/2020  ? Screening for diabetes mellitus 12/27/2020  ? BMI 38.0-38.9,adult 12/27/2020  ? OAB (overactive bladder) 12/27/2020  ? Hypokalemia 12/27/2020  ? Primary osteoarthritis of right knee 11/16/2020  ? H/O: hysterectomy 09/21/2020  ? Vaccine counseling 11/20/2019  ? Encounter for screening mammogram for malignant neoplasm of breast 11/20/2019  ? Gout of foot 11/20/2019  ? Encounter for hepatitis C screening test for low risk patient 11/20/2019  ? Primary osteoarthritis of both knees 05/14/2019  ? Diverticulosis 11/19/2018  ? Vitamin D deficiency 11/24/2016  ? S/P right knee arthroscopy 12/13/2015  ? Obesity with serious comorbidity 05/03/2015  ? Overactive bladder 05/03/2015  ? Essential hypertension 05/03/2015  ? Routine general medical examination at a health care facility 05/03/2015  ? Arthritis, senescent 05/03/2015  ? Hypertriglyceridemia 05/03/2015  ? Impaired fasting blood sugar 05/03/2015  ? ?PCP: Carlena Hurl, PA-C ?  ?REFERRING PROVIDER: [name]* ?  ?REFERRING DIAG: [phone] (ICD-10-CM) - Status post total right knee replacement ?  ? ?THERAPY DIAG:  ?Difficulty in walking, not elsewhere classified ? ?Muscle weakness (generalized) ? ?Stiffness of right knee, not elsewhere classified ? ?PERTINENT HISTORY: Pt is a 51 y/o F s/p R TKA performed by Dr. Ninfa Linden  on 01/14/2021 ? ? ONSET DATE: DOS: 01/14/2021 ? ?PRECAUTIONS: None ? ?SUBJECTIVE:  ?Pt presents to PT with reports of decreasing R knee pain. Does have pain in non-surgical L knee. Pt is ready to begin PT at this time.  ? ?Pain: ?Are you having pain? Yes ?NPRS: 3/10 R knee; 9/10 L knee ?Pain location: R medial knee ?PAIN TYPE: sharp and tight ?Pain description: intermittent  ?Aggravating factors: touch, bending forward ?Relieving factors: ice, compression ? ? ?OBJECTIVE:  ?  ?PATIENT SURVEYS:  ?FOTO intake 59%  ?FOTO 62% function - 03/24/2021 ?FOTO status 70% 04/12/21 ?  ?LE AROM/PROM: ?  ?A/PROM Right ?03/04/21 Right  ?03/08/21  Right ?03/10/21 Right ?03/17/2021 Right ?03/21/21 Right ?03/24/2021 Right ?03/30/2021 Right ?04/01/2021 Right ?04/04/2021 Right ?04/06/2021  ?Knee flexion 92 AAROm-supine and feet on wall  95 AAROM ?strap 100 AAROM 101 PROM 97 AAROM 101 AAROM 105 AAROM 100 AAROM 105 AAROM 105 AAROM  ?Knee extension      _0 ? (Blank rows = not tested) ?  ?  ?FUNCTIONAL TESTS:  ?30"STS: 12 reps with no UE support - 03/30/2021 ?  ?  ?TODAY'S TREATMENT: ?[facility] Adult PT Treatment:                                                DATE: 04/12/2021 ?Therapeutic Exercise: ?Rec bike x 6 min while taking subjective for increase knee flexion ?Knee ext 10# 10 x 3, Rt ?Knee flex 20# 10 x 3 , Rt ?Slant board stretch 3x30" ?Mini squat at FM 2x10 ?30 sec STS 10 reps from standard chair ?SLS trials 8 sec best ?Tandem stance 60 sec  ? ?Modalities:  ?Ice pack 10 minutes right knee post session pt supine leg elevated ? ?[facility] Adult PT Treatment:                                                DATE: 04/06/2021 ?Therapeutic Exercise: ?Rec bike x 4 min while taking subjective for increase knee flexion ?Slant board stretch 3x30" ?Mini squat at FM 2x15 ?Step up 6in fwd x 10 R leading ?R Knee ext 3x10 10# ?R knee flex 3x10 20# ?R heel slide on wall x 5 ?Supine heel slide with strap x 5 - 10" hold ?Supine SLR 2x10 2.5# R ?Manual Therapy: ?AP mobs to R knee in supine grade III ?STM to bilateral hamstring tendons ?Modalities:  ?Ice pack 10 minutes right knee post session pt supine leg elevated ? ?[facility] Adult PT Treatment:                                                DATE: 04/04/2021 ?Therapeutic Exercise: ?Rec bike x 4 min while taking subjective for increase knee flexion ?Slant board stretch 2x30" ?Mini squat at FM 2x15 ?Step up 6in fwd x 10 R leading ?R Knee ext 2x10 10# ?R knee flex 2x10 20# ?R heel slide on wall x 10 ?Supine SLR 2x10 2# R ?Supine hamstring stretch with strap 2x30" R ?R quad set with half foam under heel x 10 - 5" hold ?Modalities:  ?  Ice pack 10 minutes  right knee post session pt supine leg elevated ? ?[facility] Adult PT Treatment:                                                DATE: 04/01/2021 ?Therapeutic Exercise: ?Nustep L6 UE/LE x 5 min while taking subjective ?Slant board stretch 2x30" ?Mini squat at FM 2x10 ?R Knee ext 2x10 10# ?R knee flex 2x10 15# ?STS 2x10 - no UE support ?LAQ R 2x10 5" hold ?SLR with QS R x10 ?R heel slide on wall x 10 - 5" hold ?R quad set with half foam under heel x 10 - 5" hold ?Manual Therapy: ?Strap mobs right knee seated EOM, PROM knee flexion ?Modalities:  ?Ice pack 10 minutes right knee post session pt supine leg elevated ? ? ?PATIENT EDUCATION:  ?Education details: continue HEP ?Person educated: Patient ?Education method: Explanation, Demonstration, and Handouts ?Education comprehension: verbalized understanding and returned demonstration ?  ?  ?HOME EXERCISE PROGRAM: ?Access Code: [email or web address] ?URL: [email or web address] ?Date: 03/02/2021 ?Prepared by: Octavio Manns ? ?Exercises ?Seated Heel Slide - 3-4 x daily - 7 x weekly - 2 sets - 10 reps - 10" hold ?Seated Quad Set - 3-4 x daily - 7 x weekly - 2 sets - 10 reps - 5" hold ?Seated Long Arc Quad - 3-4 x daily - 7 x weekly - 3 sets - 10 reps ?Supine Quadricep Sets - 3-4 x daily - 7 x weekly - 2 sets - 10 reps - 5" hold ?Supine Heel Slide with Strap - 5-7 x daily - 7 x weekly - 2 sets - 10 reps - 10" hold ?Supine Knee Flexion Wall Slide - 1 x daily - 7 x weekly - 10 reps - 5 hold ? ?  ?ASSESSMENT: ?Pt was able to once again complete all prescribed exercises with no adverse effect. Pt saw MD on 04/07/21 who was pleased with progress and asked her to F/U in 6 months as well as scheduled her left knee for TKA in January. She reports that he was pleased with her knee ROM and did not expect her to get much more ROM. She is nearing the end of POC and returns to work next week. She has met or partially most of her LTGS. She will have one more visit to review HEP and then discharge.   ? ?  ?GOALS: ?Goals reviewed with patient? No ?  ?SHORT TERM GOALS: ?  ?STG Name Target Date Goal status  ?1 Pt will be compliant and knowledgeable with initial HEP for improved comfort and carryover ?[place]

## 2021-04-12 NOTE — Therapy (Incomplete)
?OUTPATIENT PHYSICAL THERAPY TREATMENT NOTE ? ? ?Patient Name: Kayla Larsen ?MRN: [id number] ?DOB:08-Mar-1970, 51 y.o., female ?Today's Date: 04/12/2021 ? ?PCP: Carlena Hurl, PA-C ?REFERRING PROVIDER: Carlena Hurl, PA-C ? ? ? ? ? ? ? ? ? ? ? ? ? ? ?Past Medical History:  ?Diagnosis Date  ? Acanthosis nigricans   ? Anemia   ? Arthritis of knee   ? External hemorrhoid   ? History of blood transfusion 2009  ? 2 units after hysterectomy  ? Hypertension   ? Obesity   ? Plantar fasciitis of left foot   ? Sleep apnea 2010  ? borderline no cpap used  ? Wears glasses   ? ?Past Surgical History:  ?Procedure Laterality Date  ? CESAREAN SECTION    ? x 2  ? COLONOSCOPY  2009  ? done for anemia eval; Dr. Benson Norway  ? COLONOSCOPY  01/25/2018  ? multiple aphthous ulcers in terminal ileum likely d/t NSAIDs, diverticulosis in sigmoid colon, Dr. [place] Cellar  ? KNEE SURGERY  10/2015  ? knot removed from R knee  ? LAPAROTOMY Left 07/30/2012  ? Procedure: LEFT SALPINGO OOPHERECTOMY,  OMENTECTOMY, AND LYSIS OF ADHESIONS;  Surgeon: Alvino Chapel, MD;  Location: [facility];  Service: Gynecology;  Laterality: Left;  ? OOPHORECTOMY  2014  ? left   ? TOTAL ABDOMINAL HYSTERECTOMY  2009  ? TOTAL KNEE ARTHROPLASTY Right 01/14/2021  ? Procedure: Right TOTAL KNEE ARTHROPLASTY;  Surgeon: [name], MD;  Location: [facility];  Service: Orthopedics;  Laterality: Right;  RNFA  ? ?Patient Active Problem List  ? Diagnosis Date Noted  ? Status post total right knee replacement 01/16/2021  ? Status post right knee replacement 01/14/2021  ? Encounter for health maintenance examination in adult 12/27/2020  ? Screening for heart disease 12/27/2020  ? History of gout 12/27/2020  ? Iron deficiency 12/27/2020  ? Screening for diabetes mellitus 12/27/2020  ? BMI 38.0-38.9,adult 12/27/2020  ? OAB (overactive bladder) 12/27/2020  ? Hypokalemia 12/27/2020  ? Primary osteoarthritis of right knee 11/16/2020  ? H/O: hysterectomy 09/21/2020  ?  Vaccine counseling 11/20/2019  ? Encounter for screening mammogram for malignant neoplasm of breast 11/20/2019  ? Gout of foot 11/20/2019  ? Encounter for hepatitis C screening test for low risk patient 11/20/2019  ? Primary osteoarthritis of both knees 05/14/2019  ? Diverticulosis 11/19/2018  ? Vitamin D deficiency 11/24/2016  ? S/P right knee arthroscopy 12/13/2015  ? Obesity with serious comorbidity 05/03/2015  ? Overactive bladder 05/03/2015  ? Essential hypertension 05/03/2015  ? Routine general medical examination at a health care facility 05/03/2015  ? Arthritis, senescent 05/03/2015  ? Hypertriglyceridemia 05/03/2015  ? Impaired fasting blood sugar 05/03/2015  ? ?PCP: Carlena Hurl, PA-C ?  ?REFERRING PROVIDER: [name]* ?  ?REFERRING DIAG: [phone] (ICD-10-CM) - Status post total right knee replacement ?  ? ?THERAPY DIAG:  ?No diagnosis found. ? ?PERTINENT HISTORY: Pt is a 51 y/o F s/p R TKA performed by Dr. Ninfa Linden on 01/14/2021 ? ? ONSET DATE: DOS: 01/14/2021 ? ?PRECAUTIONS: None ? ?SUBJECTIVE:  ?*** ? ?Pain: ?Are you having pain? Yes ?NPRS: 3/10 R knee; 9/10 L knee ?Pain location: R medial knee ?PAIN TYPE: sharp and tight ?Pain description: intermittent  ?Aggravating factors: touch, bending forward ?Relieving factors: ice, compression ? ? ?OBJECTIVE:  ?  ?PATIENT SURVEYS:  ?FOTO 62% function - 03/24/2021 ?  ?LE AROM/PROM: ?  ?A/PROM Right ?03/04/21 Right  ?03/08/21 Right ?03/10/21 Right ?03/17/2021 Right ?  03/21/21 Right ?03/24/2021 Right ?03/30/2021 Right ?04/01/2021 Right ?04/04/2021 Right ?04/06/2021  ?Knee flexion 92 AAROm-supine and feet on wall  95 AAROM ?strap 100 AAROM 101 PROM 97 AAROM 101 AAROM 105 AAROM 100 AAROM 105 AAROM 105 AAROM  ?Knee extension      _0 ? (Blank rows = not tested) ?  ?  ?FUNCTIONAL TESTS:  ?30"STS: 12 reps with no UE support - 03/30/2021 ?  ?  ?TODAY'S TREATMENT: ?[facility] Adult PT Treatment:                                                DATE: 04/12/2021 ?Therapeutic  Exercise: ?Rec bike x 4 min while taking subjective for increase knee flexion ?Slant board stretch 3x30" ?Mini squat at FM 2x15 ?Step up 6in fwd x 10 R leading ?R Knee ext 3x10 10# ?R knee flex 3x10 20# ?R heel slide on wall x 5 ?Supine heel slide with strap x 5 - 10" hold ?Supine SLR 2x10 2.5# R ?Manual Therapy: ?AP mobs to R knee in supine grade III ?STM to bilateral hamstring tendons ?Modalities:  ?Ice pack 10 minutes right knee post session pt supine leg elevated ? ?[facility] Adult PT Treatment:                                                DATE: 04/06/2021 ?Therapeutic Exercise: ?Rec bike x 4 min while taking subjective for increase knee flexion ?Slant board stretch 3x30" ?Mini squat at FM 2x15 ?Step up 6in fwd x 10 R leading ?R Knee ext 3x10 10# ?R knee flex 3x10 20# ?R heel slide on wall x 5 ?Supine heel slide with strap x 5 - 10" hold ?Supine SLR 2x10 2.5# R ?Manual Therapy: ?AP mobs to R knee in supine grade III ?STM to bilateral hamstring tendons ?Modalities:  ?Ice pack 10 minutes right knee post session pt supine leg elevated ? ?[facility] Adult PT Treatment:                                                DATE: 04/04/2021 ?Therapeutic Exercise: ?Rec bike x 4 min while taking subjective for increase knee flexion ?Slant board stretch 2x30" ?Mini squat at FM 2x15 ?Step up 6in fwd x 10 R leading ?R Knee ext 2x10 10# ?R knee flex 2x10 20# ?R heel slide on wall x 10 ?Supine SLR 2x10 2# R ?Supine hamstring stretch with strap 2x30" R ?R quad set with half foam under heel x 10 - 5" hold ?Modalities:  ?Ice pack 10 minutes right knee post session pt supine leg elevated ? ?PATIENT EDUCATION:  ?Education details: continue HEP ?Person educated: Patient ?Education method: Explanation, Demonstration, and Handouts ?Education comprehension: verbalized understanding and returned demonstration ?  ?  ?HOME EXERCISE PROGRAM: ?Access Code: [email or web address] ?URL: [email or web address] ?Date: 03/02/2021 ?Prepared by: Octavio Manns ? ?Exercises ?Seated Heel Slide - 3-4 x daily - 7 x weekly - 2 sets - 10 reps - 10" hold ?Seated Quad Set - 3-4 x daily - 7 x weekly - 2 sets - 10 reps -  5" hold ?Seated Long Arc Quad - 3-4 x daily - 7 x weekly - 3 sets - 10 reps ?Supine Quadricep Sets - 3-4 x daily - 7 x weekly - 2 sets - 10 reps - 5" hold ?Supine Heel Slide with Strap - 5-7 x daily - 7 x weekly - 2 sets - 10 reps - 10" hold ?Supine Knee Flexion Wall Slide - 1 x daily - 7 x weekly - 10 reps - 5 hold ? ?  ?ASSESSMENT: ?*** ? ?  ?GOALS: ?Goals reviewed with patient? No ?  ?SHORT TERM GOALS: ?  ?STG Name Target Date Goal status  ?1 Pt will be compliant and knowledgeable with initial HEP for improved comfort and carryover ?Baseline: initial HEP given 02/22/2021 Met  ?03/08/21  ?2 Pt will self report R knee pain no greater than 6/10 for improved comfort and functional ability ?Baseline: 8/10 at worst ?Status: 03/08/21: 7-8/10 pain at worst  02/22/2021 ONGOING  ?03/08/21  ?  ?LONG TERM GOALS:  ?  ?LTG Name Target Date Goal status  ?1 Pt will improve FOTO function score to no less than 71% as proxy for functional improvement ?Baseline: 59% function ?03/24/2021: 62 deg 04/26/2021 ONGOING  ?2 Pt will improve R knee AROM to no less than 3-110 degrees for improved functional mobility ?Baseline: 15-60 degrees 04/26/2021 ONGOING  ?3 Pt will self report R knee pain no greater than 3/10 for improved comfort and functional ability ?Baseline: 8/10 at worst 04/26/2021 ONGOING  ?4 Pt will increase reps in 30" STS to no less than 10 without use of UE for improved balance and functional mobility ?Baseline: 8 reps ?03/30/2021: 12 reps 04/26/2021 MET  ?5 Pt will be able to amb up/down 10 stairs with reciprocal gait and no increase in pain for improved community navigation ?Baseline: unable 04/26/2021 ONGOING/  ?  ?PLAN: ?PT FREQUENCY: 2x/week ?  ?PT DURATION: 12 weeks ?  ?PLANNED INTERVENTIONS: Therapeutic exercises, Therapeutic activity, Neuro Muscular re-education, Balance  training, Gait training, Patient/Family education, Joint mobilization, Aquatic Therapy, Dry Needling, Cryotherapy, Moist heat, Vasopneumatic device, and Manual therapy ?  ?PLAN FOR NEXT SESSION: SLS ,  manual to reduc

## 2021-04-13 NOTE — Therapy (Addendum)
?OUTPATIENT PHYSICAL THERAPY TREATMENT NOTE/DISCHARGE ? ?PHYSICAL THERAPY DISCHARGE SUMMARY ? ?Visits from Start of Care: 20 ? ?Current functional level related to goals / functional outcomes: ?See goals and objective ?  ?Remaining deficits: ?Decreased Rt knee ROM ?  ?Education / Equipment: ?HEP and discharge plan  ? ?Patient agrees to discharge. Patient goals were  mostly met . Patient is being discharged due to being pleased with the current functional level, maximizing skilled rehab potential, and being independent with home exercise program.  ? ? ?Patient Name: Kayla Larsen ?MRN: [id number] ?DOB:05-Mar-1970, 51 y.o., female ?Today's Date: 04/14/2021 ? ?PCP: Carlena Hurl, PA-C ?REFERRING PROVIDER: [name]* ? ? PT End of Session - 04/14/21 0914   ? ? Visit Number 20   ? Number of Visits 21   ? Date for PT Re-Evaluation 04/26/21   ? Authorization Type [employer]   ? PT Start Time [phone]   ? PT Stop Time 0946   ? PT Time Calculation (min) 33 min   ? Activity Tolerance Patient tolerated treatment well   ? Behavior During Therapy [facility] for tasks assessed/performed   ? ?  ?  ? ?  ? ? ? ? ? ? ? ? ? ? ? ? ? ?Past Medical History:  ?Diagnosis Date  ? Acanthosis nigricans   ? Anemia   ? Arthritis of knee   ? External hemorrhoid   ? History of blood transfusion 2009  ? 2 units after hysterectomy  ? Hypertension   ? Obesity   ? Plantar fasciitis of left foot   ? Sleep apnea 2010  ? borderline no cpap used  ? Wears glasses   ? ?Past Surgical History:  ?Procedure Laterality Date  ? CESAREAN SECTION    ? x 2  ? COLONOSCOPY  2009  ? done for anemia eval; Dr. Benson Norway  ? COLONOSCOPY  01/25/2018  ? multiple aphthous ulcers in terminal ileum likely d/t NSAIDs, diverticulosis in sigmoid colon, Dr. [place] Cellar  ? KNEE SURGERY  10/2015  ? knot removed from R knee  ? LAPAROTOMY Left 07/30/2012  ? Procedure: LEFT SALPINGO OOPHERECTOMY,  OMENTECTOMY, AND LYSIS OF ADHESIONS;  Surgeon: Alvino Chapel, MD;  Location:  [facility];  Service: Gynecology;  Laterality: Left;  ? OOPHORECTOMY  2014  ? left   ? TOTAL ABDOMINAL HYSTERECTOMY  2009  ? TOTAL KNEE ARTHROPLASTY Right 01/14/2021  ? Procedure: Right TOTAL KNEE ARTHROPLASTY;  Surgeon: [name], MD;  Location: [facility];  Service: Orthopedics;  Laterality: Right;  RNFA  ? ?Patient Active Problem List  ? Diagnosis Date Noted  ? Status post total right knee replacement 01/16/2021  ? Status post right knee replacement 01/14/2021  ? Encounter for health maintenance examination in adult 12/27/2020  ? Screening for heart disease 12/27/2020  ? History of gout 12/27/2020  ? Iron deficiency 12/27/2020  ? Screening for diabetes mellitus 12/27/2020  ? BMI 38.0-38.9,adult 12/27/2020  ? OAB (overactive bladder) 12/27/2020  ? Hypokalemia 12/27/2020  ? Primary osteoarthritis of right knee 11/16/2020  ? H/O: hysterectomy 09/21/2020  ? Vaccine counseling 11/20/2019  ? Encounter for screening mammogram for malignant neoplasm of breast 11/20/2019  ? Gout of foot 11/20/2019  ? Encounter for hepatitis C screening test for low risk patient 11/20/2019  ? Primary osteoarthritis of both knees 05/14/2019  ? Diverticulosis 11/19/2018  ? Vitamin D deficiency 11/24/2016  ? S/P right knee arthroscopy 12/13/2015  ? Obesity with serious comorbidity 05/03/2015  ? Overactive bladder 05/03/2015  ?  Essential hypertension 05/03/2015  ? Routine general medical examination at a health care facility 05/03/2015  ? Arthritis, senescent 05/03/2015  ? Hypertriglyceridemia 05/03/2015  ? Impaired fasting blood sugar 05/03/2015  ? ?PCP: Carlena Hurl, PA-C ?  ?REFERRING PROVIDER: [name]* ?  ?REFERRING DIAG: [phone] (ICD-10-CM) - Status post total right knee replacement ?  ? ?THERAPY DIAG:  ?Difficulty in walking, not elsewhere classified ? ?Muscle weakness (generalized) ? ?Stiffness of right knee, not elsewhere classified ? ?PERTINENT HISTORY: Pt is a 51 y/o F s/p R TKA performed by Dr. Ninfa Linden on  01/14/2021 ? ? ONSET DATE: DOS: 01/14/2021 ? ?PRECAUTIONS: None ? ?SUBJECTIVE: Pt presents to PT with reports of decreasing R knee pain. Does have pain in non-surgical L knee and reports she will be getting a TKA on the L knee next January. Pt is ready to begin PT at this time.  ? ?Pain: ?Are you having pain? Yes ?NPRS: 5/10 R knee; 9/10 L knee ?Pain location: R medial knee ?PAIN TYPE: sharp and tight ?Pain description: intermittent  ?Aggravating factors: touch, bending forward ?Relieving factors: ice, compression ? ? ?OBJECTIVE:  ?  ?PATIENT SURVEYS:  ?FOTO intake 59%  ?FOTO 62% function - 03/24/2021 ?FOTO status 70% 04/12/21 ?  ?LE AROM/PROM: ?  ?A/PROM Right ?03/04/21 Right  ?03/08/21 Right ?03/10/21 Right ?03/17/2021 Right ?03/21/21 Right ?03/24/2021 Right ?03/30/2021 Right ?04/01/2021 Right ?04/04/2021 Right ?04/06/2021  ?Knee flexion 92 AAROm-supine and feet on wall  95 AAROM ?strap 100 AAROM 101 PROM 97 AAROM 101 AAROM 105 AAROM 100 AAROM 105 AAROM 105 AAROM  ?Knee extension      _0 ? (Blank rows = not tested) ?  ?  ?FUNCTIONAL TESTS:  ?30"STS: 12 reps with no UE support - 03/30/2021 ?SLS R: 12" 04/14/2021 ?  ?  ?TODAY'S TREATMENT: ?[facility] Adult PT Treatment:                                                DATE: 04/14/2021 ?Therapeutic Exercise: ?Rec bike x 6 min while taking subjective for increase knee flexion ?SLS time ?LAQ x10 BIL ?Seated hamstring stretch x30" BIL ?Supine bridge 5" hold at top ?SLR with QS x10 BIL ?Sidelying hip abduction x10 BIL ?SLS trials 12 sec best  ?Therapeutic Activity: ?Discussion of goals ?HEP review ? ? ?[facility] Adult PT Treatment:                                                DATE: 04/12/2021 ?Therapeutic Exercise: ?Rec bike x 6 min while taking subjective for increase knee flexion ?Knee ext 10# 10 x 3, Rt ?Knee flex 20# 10 x 3 , Rt ?Slant board stretch 3x30" ?Mini squat at FM 2x10 ?30 sec STS 10 reps from standard chair ?SLS trials 8 sec best ?Tandem stance 60 sec  ? ?Modalities:  ?Ice pack 10  minutes right knee post session pt supine leg elevated ? ?[facility] Adult PT Treatment:  DATE: 04/06/2021 ?Therapeutic Exercise: ?Rec bike x 4 min while taking subjective for increase knee flexion ?Slant board stretch 3x30" ?Mini squat at FM 2x15 ?Step up 6in fwd x 10 R leading ?R Knee ext 3x10 10# ?R knee flex 3x10 20# ?R heel slide on wall x 5 ?Supine heel slide with strap x 5 - 10" hold ?Supine SLR 2x10 2.5# R ?Manual Therapy: ?AP mobs to R knee in supine grade III ?STM to bilateral hamstring tendons ?Modalities:  ?Ice pack 10 minutes right knee post session pt supine leg elevated ? ?[facility] Adult PT Treatment:                                                DATE: 04/04/2021 ?Therapeutic Exercise: ?Rec bike x 4 min while taking subjective for increase knee flexion ?Slant board stretch 2x30" ?Mini squat at FM 2x15 ?Step up 6in fwd x 10 R leading ?R Knee ext 2x10 10# ?R knee flex 2x10 20# ?R heel slide on wall x 10 ?Supine SLR 2x10 2# R ?Supine hamstring stretch with strap 2x30" R ?R quad set with half foam under heel x 10 - 5" hold ?Modalities:  ?Ice pack 10 minutes right knee post session pt supine leg elevated ? ? ?PATIENT EDUCATION:  ?Education details: continue HEP ?Person educated: Patient ?Education method: Explanation, Demonstration, and Handouts ?Education comprehension: verbalized understanding and returned demonstration ?  ?  ?HOME EXERCISE PROGRAM: ?Access Code: [email or web address] ?URL: [email or web address] ?Date: 03/02/2021 ?Prepared by: Octavio Manns ? ?Exercises ?Seated Heel Slide - 3-4 x daily - 7 x weekly - 2 sets - 10 reps - 10" hold ?Seated Quad Set - 3-4 x daily - 7 x weekly - 2 sets - 10 reps - 5" hold ?Seated Long Arc Quad - 3-4 x daily - 7 x weekly - 3 sets - 10 reps ?Supine Quadricep Sets - 3-4 x daily - 7 x weekly - 2 sets - 10 reps - 5" hold ?Supine Heel Slide with Strap - 5-7 x daily - 7 x weekly - 2 sets - 10 reps - 10" hold ?Supine Knee Flexion  Wall Slide - 1 x daily - 7 x weekly - 10 reps - 5 hold ? ?  ?ASSESSMENT: ?Patient presents to PT with decreasing overall R knee pain and increased L knee pain, reporting she is getting a TKA on the L next Janu

## 2021-04-14 ENCOUNTER — Ambulatory Visit: Payer: 59

## 2021-04-14 DIAGNOSIS — M25661 Stiffness of right knee, not elsewhere classified: Secondary | ICD-10-CM

## 2021-04-14 DIAGNOSIS — R262 Difficulty in walking, not elsewhere classified: Secondary | ICD-10-CM

## 2021-04-14 DIAGNOSIS — M6281 Muscle weakness (generalized): Secondary | ICD-10-CM

## 2021-04-19 ENCOUNTER — Telehealth: Payer: Self-pay | Admitting: Orthopaedic Surgery

## 2021-04-19 NOTE — Telephone Encounter (Signed)
Patient is requesting intermittent leave, specifically for  ?2 episodes a month lasting 2 days per episode. Please advise. ?

## 2021-04-19 NOTE — Telephone Encounter (Signed)
Noted for Ciox ?

## 2021-04-24 ENCOUNTER — Other Ambulatory Visit: Payer: Self-pay | Admitting: Orthopaedic Surgery

## 2021-04-25 ENCOUNTER — Ambulatory Visit: Payer: 59 | Admitting: Physician Assistant

## 2021-04-25 MED ORDER — TIZANIDINE HCL 4 MG PO TABS: 4.0000 mg | ORAL_TABLET | Freq: Three times a day (TID) | ORAL | 0 refills | Status: DC | PRN

## 2021-04-25 MED ORDER — HYDROCODONE-ACETAMINOPHEN 7.5-325 MG PO TABS
1.0000 | ORAL_TABLET | Freq: Four times a day (QID) | ORAL | 0 refills | Status: DC | PRN
Start: 2021-04-25 — End: 2021-08-22

## 2021-04-25 NOTE — Telephone Encounter (Signed)
Lvm advising pt.

## 2021-05-08 ENCOUNTER — Other Ambulatory Visit: Payer: Self-pay | Admitting: Orthopaedic Surgery

## 2021-05-21 ENCOUNTER — Other Ambulatory Visit: Payer: Self-pay | Admitting: Medical

## 2021-06-08 ENCOUNTER — Ambulatory Visit (INDEPENDENT_AMBULATORY_CARE_PROVIDER_SITE_OTHER): Payer: 59 | Admitting: Orthopaedic Surgery

## 2021-06-08 DIAGNOSIS — M1712 Unilateral primary osteoarthritis, left knee: Secondary | ICD-10-CM

## 2021-06-08 DIAGNOSIS — M25562 Pain in left knee: Secondary | ICD-10-CM

## 2021-06-08 DIAGNOSIS — G8929 Other chronic pain: Secondary | ICD-10-CM | POA: Diagnosis not present

## 2021-06-08 DIAGNOSIS — Z96651 Presence of right artificial knee joint: Secondary | ICD-10-CM | POA: Diagnosis not present

## 2021-06-08 MED ORDER — METHYLPREDNISOLONE ACETATE 40 MG/ML IJ SUSP
40.0000 mg | INTRAMUSCULAR | Status: AC | PRN
  Administered 2021-06-08: 40 mg via INTRA_ARTICULAR

## 2021-06-08 MED ORDER — LIDOCAINE HCL 1 % IJ SOLN
3.0000 mL | INTRAMUSCULAR | Status: AC | PRN
  Administered 2021-06-08: 3 mL

## 2021-06-08 NOTE — Progress Notes (Signed)
Office Visit Note   Patient: Kayla Larsen           Date of Birth: 04/18/1970           MRN: [id number] Visit Date: 06/08/2021              Requested by: Carlena Hurl, PA-C [address] [place],  [place] [address] PCP: Carlena Hurl, PA-C   Assessment & Plan: Visit Diagnoses:  1. Status post total right knee replacement   2. Chronic pain of left knee   3. Primary osteoarthritis of left knee     Plan: Per the patient's request I did place a steroid injection in her left knee without difficulty.  I gave her reassurance that her right knee is where it should be and that the pain and swelling is normal for just 4 months after knee replacement in a young 51 year old female.  She will continue strengthening exercises and give this time.  I will see her back in 3 months.  At that visit we will have a AP and lateral of her right operative knee.  Follow-Up Instructions: Return in about 3 months (around 09/08/2021).   Orders:  Orders Placed This Encounter  Procedures   Large Joint Inj   No orders of the defined types were placed in this encounter.     Procedures: Large Joint Inj: L knee on 06/08/2021 8:36 AM Indications: diagnostic evaluation and pain Details: 22 G 1.5 in needle, superolateral approach  Arthrogram: No  Medications: 3 mL lidocaine 1 %; 40 mg methylPREDNISolone acetate 40 MG/ML Outcome: tolerated well, no immediate complications Procedure, treatment alternatives, risks and benefits explained, specific risks discussed. Consent was given by the patient. Immediately prior to procedure a time out was called to verify the correct patient, procedure, equipment, support staff and site/side marked as required. Patient was prepped and draped in the usual sterile fashion.      Clinical Data: No additional findings.   Subjective: Chief Complaint  Patient presents with   Left Knee - Pain  The patient is well-known to [place].  She is 4 months status post a  right total knee arthroplasty.  Said the right knee has been little bit painful and swollen but she is requesting steroid injection her left knee today which has known arthritis in issues putting more weight through her left knee as she gets over her right knee surgery.  She has back to work.  HPI  Review of Systems There is currently no fever, chills, nausea, vomiting  Objective: Vital Signs: There were no vitals taken for this visit.  Physical Exam She is alert and orient x3 and in no acute distress Ortho Exam Her right operative knee actually has full range of motion with only some mild swelling.  There is no significant warmth.  The knee is ligamentously stable on my exam.  Her left knee has some global tenderness but no effusion.  There is patellofemoral crepitation and slight varus malalignment. Specialty Comments:  No specialty comments available.  Imaging: No results found.   PMFS History: Patient Active Problem List   Diagnosis Date Noted   Status post total right knee replacement 01/16/2021   Status post right knee replacement 01/14/2021   Encounter for health maintenance examination in adult 12/27/2020   Screening for heart disease 12/27/2020   History of gout 12/27/2020   Iron deficiency 12/27/2020   Screening for diabetes mellitus 12/27/2020   BMI 38.0-38.9,adult 12/27/2020   OAB (overactive bladder)  12/27/2020   Hypokalemia 12/27/2020   Primary osteoarthritis of right knee 11/16/2020   H/O: hysterectomy 09/21/2020   Vaccine counseling 11/20/2019   Encounter for screening mammogram for malignant neoplasm of breast 11/20/2019   Gout of foot 11/20/2019   Encounter for hepatitis C screening test for low risk patient 11/20/2019   Primary osteoarthritis of both knees 05/14/2019   Diverticulosis 11/19/2018   Vitamin D deficiency 11/24/2016   S/P right knee arthroscopy 12/13/2015   Obesity with serious comorbidity 05/03/2015   Overactive bladder 05/03/2015    Essential hypertension 05/03/2015   Routine general medical examination at a health care facility 05/03/2015   Arthritis, senescent 05/03/2015   Hypertriglyceridemia 05/03/2015   Impaired fasting blood sugar 05/03/2015   Past Medical History:  Diagnosis Date   Acanthosis nigricans    Anemia    Arthritis of knee    External hemorrhoid    History of blood transfusion 2009   2 units after hysterectomy   Hypertension    Obesity    Plantar fasciitis of left foot    Sleep apnea 2010   borderline no cpap used   Wears glasses     Family History  Problem Relation Age of Onset   Cancer Mother        liver and lung   Hypertension Mother    Diabetes Father    Kidney failure Father    Hypertension Brother    Hypertension Brother    Hypertension Brother    Cancer Maternal Aunt        thyroid   Diabetes Maternal Aunt    Hypertension Maternal Aunt    Heart disease Maternal Grandmother    Diabetes Maternal Uncle    Hypertension Maternal Uncle    Stroke Neg Hx     Past Surgical History:  Procedure Laterality Date   CESAREAN SECTION     x 2   COLONOSCOPY  2009   done for anemia eval; Dr. Benson Norway   COLONOSCOPY  01/25/2018   multiple aphthous ulcers in terminal ileum likely d/t NSAIDs, diverticulosis in sigmoid colon, Dr. [place] Cellar   KNEE SURGERY  10/2015   knot removed from R knee   LAPAROTOMY Left 07/30/2012   Procedure: LEFT SALPINGO OOPHERECTOMY,  OMENTECTOMY, AND LYSIS OF ADHESIONS;  Surgeon: Alvino Chapel, MD;  Location: [facility];  Service: Gynecology;  Laterality: Left;   OOPHORECTOMY  2014   left    TOTAL ABDOMINAL HYSTERECTOMY  2009   TOTAL KNEE ARTHROPLASTY Right 01/14/2021   Procedure: Right TOTAL KNEE ARTHROPLASTY;  Surgeon: [name], MD;  Location: [facility];  Service: Orthopedics;  Laterality: Right;  RNFA   Social History   Occupational History   Not on file  Tobacco Use   Smoking status: Former    Packs/day: 0.25    Years: 4.00     Pack years: 1.00    Types: Cigarettes    Quit date: 09/29/2011    Years since quitting: 9.6   Smokeless tobacco: Never  Vaping Use   Vaping Use: Never used  Substance and Sexual Activity   Alcohol use: No    Alcohol/week: 2.0 standard drinks    Types: 2 Shots of liquor per week   Drug use: No   Sexual activity: Not Currently

## 2021-06-21 ENCOUNTER — Other Ambulatory Visit: Payer: Self-pay | Admitting: Orthopaedic Surgery

## 2021-07-01 ENCOUNTER — Other Ambulatory Visit: Payer: Self-pay | Admitting: Medical

## 2021-07-11 ENCOUNTER — Telehealth: Payer: Self-pay | Admitting: Orthopedic Surgery

## 2021-07-11 NOTE — Telephone Encounter (Signed)
Kayla Larsen states that she needs a note/implant card stating that she has a knee replacement so that she can make it through a metal detector. She will come by and pick it up when it's ready.  Her # is [phone].

## 2021-07-13 NOTE — Telephone Encounter (Signed)
Completed. Placed at front desk. Pt was called and informed

## 2021-07-24 ENCOUNTER — Other Ambulatory Visit: Payer: Self-pay | Admitting: Medical

## 2021-08-22 ENCOUNTER — Encounter: Payer: Self-pay | Admitting: Family Medicine

## 2021-08-22 ENCOUNTER — Telehealth (INDEPENDENT_AMBULATORY_CARE_PROVIDER_SITE_OTHER): Payer: 59 | Admitting: Family Medicine

## 2021-08-22 VITALS — Temp 99.3°F | Ht 64.0 in | Wt 229.0 lb

## 2021-08-22 DIAGNOSIS — U071 COVID-19: Secondary | ICD-10-CM | POA: Diagnosis not present

## 2021-08-22 NOTE — Progress Notes (Signed)
Start time: 10:28 End time: 10:50  Virtual Visit via Video Note  I connected with Kayla Larsen on 08/22/21 by a video enabled telemedicine application and verified that I am speaking with the correct person using two identifiers.  Location: Patient: home Provider: office   I discussed the limitations of evaluation and management by telemedicine and the availability of in person appointments. The patient expressed understanding and agreed to proceed.  History of Present Illness:  Chief Complaint  Patient presents with   Covid Positive    VIRTUAL positive covid home test Saturday,. Symptoms started Friday with scratchy throat. Feeling ok now. Needs note for work for when she goes back and saying that you are out for covid in the meantime. This is the first time she has had covid. Thermometer not working, she feels like she has a "mild temp."   8/12 she started with scratchy throat and stopped up nose. She is having more congestion, blowing her nose more--greenish mucus. She tested + later that day. She vomited once while taking a shower.  Nausea resolved, eating/drinking okay. +diarrhea since yesterday, last episode 8am, watery.  Denies blood or mucus. Denies abdominal pain.  +fever/chills. No body aches.  Went out to eat, some nearby in [employer] was coughing.  PMH, PSH, SH  HTN, gout Had initial series of 2, no boosters  Outpatient Encounter Medications as of 08/22/2021  Medication Sig Note   allopurinol (ZYLOPRIM) 100 MG tablet Take 1 tablet (100 mg total) by mouth daily.    Cholecalciferol (VITAMIN D) 50 MCG (2000 UT) tablet Take 2,000 Units by mouth daily.    CRANBERRY PO Take 1 capsule by mouth daily.    DM-APAP-CPM (CORICIDIN HBP MAX STRENGTH FLU) 10-325-2 MG TABS Take 2 tablets by mouth as needed. 08/22/2021: Last dose 7am   ferrous sulfate 325 (65 FE) MG tablet Take 325 mg by mouth daily.    Multiple Vitamin (MULTIVITAMIN WITH MINERALS) TABS Take 1 tablet by mouth  daily.    NON FORMULARY Take 1 packet by mouth daily. 08/22/2021: Immune Max packet-'20mg'$  zinc, '2000mg'$  vit C and and 1000iu vit D      Olmesartan-amLODIPine-HCTZ 40-5-12.5 MG TABS TAKE 1 TABLET BY MOUTH DAILY    potassium chloride (KLOR-CON) 10 MEQ tablet TAKE 1 TABLET(10 MEQ) BY MOUTH DAILY    tolterodine (DETROL LA) 4 MG 24 hr capsule TAKE 1 CAPSULE(4 MG) BY MOUTH DAILY    [DISCONTINUED] zolpidem (AMBIEN CR) 12.5 MG CR tablet Take 1 tablet (12.5 mg total) by mouth at bedtime as needed for sleep.    diclofenac (VOLTAREN) 75 MG EC tablet TAKE 1 TABLET(75 MG) BY MOUTH TWICE DAILY (Patient not taking: Reported on 08/22/2021) 08/22/2021: prn   diclofenac Sodium (VOLTAREN) 1 % GEL APPLY TOPICALLY TO THE AFFECTED AREA DAILY AS NEEDED FOR PAIN (Patient not taking: Reported on 08/22/2021) 08/22/2021: prn   [DISCONTINUED] acetaminophen (TYLENOL) 650 MG CR tablet Take 1 tablet (650 mg total) by mouth in the morning and at bedtime.    [DISCONTINUED] aspirin 81 MG chewable tablet Chew 1 tablet (81 mg total) by mouth 2 (two) times daily.    [DISCONTINUED] HYDROcodone-acetaminophen (NORCO) 7.5-325 MG tablet Take 1 tablet by mouth every 6 (six) hours as needed for moderate pain.    [DISCONTINUED] HYDROcodone-acetaminophen (NORCO) 7.5-325 MG tablet Take 1 tablet by mouth every 6 (six) hours as needed for moderate pain.    [DISCONTINUED] tiZANidine (ZANAFLEX) 4 MG tablet TAKE 1 TABLET(4 MG) BY MOUTH EVERY 8 HOURS  AS NEEDED FOR MUSCLE SPASMS    [DISCONTINUED] TURMERIC PO Take 2 capsules by mouth daily.    No facility-administered encounter medications on file as of 08/22/2021.   Allergies  Allergen Reactions   Amlodipine     Ankle swelling at '10mg'$    Lisinopril     Cough    ROS:  No chest pain, shortness of breath. Had some dizziness yesterday, better today. +respiratory symptoms, emesis and diarrhea per HPI. No rash.    Observations/Objective:  Temp 99.3 F (37.4 C) Comment: thermometer not working  Ht  '5\' 4"'$  (1.626 m)   Wt 229 lb (103.9 kg)   BMI 39.31 kg/m   Well-appearing female in no distress. She is alert and oriented. There is no significant coughing during visit, speaking easily. Exam is limited due to the virtual nature of the visit.  Assessment and Plan:  COVID-19 virus infection - reviewed supportive measures, isolation/masking recs, OTC meds  8/12 (Sat) day 0 Isolate days 1-5 (Sun through [employer]) Days 6-10 (Friday through Tuesday), if you don't have a fever and your respiratory symptoms are significantly improved, then you may leave isolation, but need to wear a mask at all times. You should stay isolated if still having fever or cough isn't significantly better.  Note for work-- Patient was seen for illness (COVID). She is to remain out of work through 8/17 (may return 8/18).  Drink plenty of water. Continue your current coricidin as directed on the bottle. If your mucus or phlegm is thick, you should add guaifenesin (the expectorant in mucinex and robitussin--be sure to get this SINGLE ingredient, not anything that also has DM, since you are getting the dextromethorphan in your coricidn). You may take ibuprofen between doses of coricidin, if needed for any pain or fever. Coricidin contains acetaminophen (tylenol), so don't use any extra tylenol.  Stay away from dairy products (milk, cheese, etc) for the next 5 days. Sticks with a bland diet, BRAT diet--bananas, rice, applesauce and toast. Advance your diet as tolerated, sticking with a bland diet, no fried/greasy/spicy foods. You may use imodium if you continue to have frequent diarrhea.   Follow Up Instructions:    I discussed the assessment and treatment plan with the patient. The patient was provided an opportunity to ask questions and all were answered. The patient agreed with the plan and demonstrated an understanding of the instructions.   The patient was advised to call back or seek an in-person evaluation if  the symptoms worsen or if the condition fails to improve as anticipated.  I spent 24 minutes dedicated to the care of this patient, including pre-visit review of records, face to face time, post-visit ordering of testing and documentation.    Vikki Ports, MD

## 2021-08-22 NOTE — Patient Instructions (Addendum)
Drink plenty of water. Continue your current coricidin as directed on the bottle. If your mucus or phlegm is thick, you should add guaifenesin (the expectorant in mucinex and robitussin--be sure to get this SINGLE ingredient, not anything that also has DM, since you are getting the dextromethorphan in your coricidn). You may take ibuprofen between doses of coricidin, if needed for any pain or fever. Coricidin contains acetaminophen (tylenol), so don't use any extra tylenol.  Stay away from dairy products (milk, cheese, etc) for the next 5 days. Sticks with a bland diet, BRAT diet--bananas, rice, applesauce and toast. Advance your diet as tolerated, sticking with a bland diet, no fried/greasy/spicy foods. You may use imodium if you continue to have frequent diarrhea.   8/12 (Sat) day 0 Isolate days 1-5 (Sun through [employer]) Days 6-10 (Friday through Tuesday), if you don't have a fever and your respiratory symptoms are significantly improved, then you may leave isolation, but need to wear a mask at all times. You should stay isolated if still having fever or cough isn't significantly better.

## 2021-08-28 ENCOUNTER — Other Ambulatory Visit: Payer: Self-pay | Admitting: Medical

## 2021-09-07 ENCOUNTER — Ambulatory Visit (INDEPENDENT_AMBULATORY_CARE_PROVIDER_SITE_OTHER): Payer: 59

## 2021-09-07 ENCOUNTER — Encounter: Payer: Self-pay | Admitting: Orthopaedic Surgery

## 2021-09-07 ENCOUNTER — Ambulatory Visit (INDEPENDENT_AMBULATORY_CARE_PROVIDER_SITE_OTHER): Payer: 59 | Admitting: Orthopaedic Surgery

## 2021-09-07 DIAGNOSIS — Z96651 Presence of right artificial knee joint: Secondary | ICD-10-CM

## 2021-09-07 DIAGNOSIS — M1712 Unilateral primary osteoarthritis, left knee: Secondary | ICD-10-CM

## 2021-09-07 MED ORDER — LIDOCAINE HCL 1 % IJ SOLN
3.0000 mL | INTRAMUSCULAR | Status: AC | PRN
  Administered 2021-09-07: 3 mL

## 2021-09-07 MED ORDER — METHYLPREDNISOLONE ACETATE 40 MG/ML IJ SUSP
40.0000 mg | INTRAMUSCULAR | Status: AC | PRN
  Administered 2021-09-07: 40 mg via INTRA_ARTICULAR

## 2021-09-07 NOTE — Progress Notes (Signed)
Office Visit Note   Patient: Kayla Larsen           Date of Birth: 1970/11/14           MRN: [id number] Visit Date: 09/07/2021              Requested by: Carlena Hurl, PA-C [address] [place],  [place] [address] PCP: Carlena Hurl, PA-C   Assessment & Plan: Visit Diagnoses:  1. History of total right knee replacement   2. Primary osteoarthritis of left knee     Plan:  She will continue work on range of motion strengthening both knees.  Follow-up with [place] as needed for the left knee she knows to wait at least 3 months between injections.  Questions were encouraged and answered at length.  Follow-Up Instructions: Return if symptoms worsen or fail to improve.   Orders:  Orders Placed This Encounter  Procedures   Large Joint Inj: L knee   XR Knee 1-2 Views Right   No orders of the defined types were placed in this encounter.     Procedures: Large Joint Inj: L knee on 09/07/2021 8:44 AM Indications: pain Details: 22 G 1.5 in needle, anterolateral approach  Arthrogram: No  Medications: 3 mL lidocaine 1 %; 40 mg methylPREDNISolone acetate 40 MG/ML Outcome: tolerated well, no immediate complications Procedure, treatment alternatives, risks and benefits explained, specific risks discussed. Consent was given by the patient. Immediately prior to procedure a time out was called to verify the correct patient, procedure, equipment, support staff and site/side marked as required. Patient was prepped and draped in the usual sterile fashion.       Clinical Data: No additional findings.   Subjective: Chief Complaint  Patient presents with   Right Knee - Follow-up    HPI Kayla Larsen returns today follow-up of her right total knee arthroplasty which was performed on 01/14/2021.  She states knee is doing okay.  However she got COVID and feels that her knee got stiffer during the time that she had COVID.  She had no new injury to the knee.  She is also requesting a  injection in her left knee which she has known end-stage arthritis.  States the last injection helped for quite a while.  She currently has no fevers or chills.  Review of Systems  Constitutional:  Negative for chills and fever.     Objective: Vital Signs: There were no vitals taken for this visit.  Physical Exam Constitutional:      Appearance: She is not ill-appearing or diaphoretic.  Pulmonary:     Effort: Pulmonary effort is normal.  Neurological:     Mental Status: She is alert and oriented to person, place, and time.  Psychiatric:        Mood and Affect: Mood normal.     Ortho Exam Right knee full extension full flexion.  No instability valgus varus stressing.  Surgical incisions well-healed.  Left knee full extension full flexion no obvious varus deformity.  No abnormal warmth or erythema.  Specialty Comments:  No specialty comments available.  Imaging: XR Knee 1-2 Views Right  Result Date: 09/07/2021 Right knee 2 views: Status post right total knee arthroplasty well-seated components.  No hardware failure.  No acute fractures or acute findings.    PMFS History: Patient Active Problem List   Diagnosis Date Noted   Status post total right knee replacement 01/16/2021   Status post right knee replacement 01/14/2021   Encounter for  health maintenance examination in adult 12/27/2020   Screening for heart disease 12/27/2020   History of gout 12/27/2020   Iron deficiency 12/27/2020   Screening for diabetes mellitus 12/27/2020   BMI 38.0-38.9,adult 12/27/2020   OAB (overactive bladder) 12/27/2020   Hypokalemia 12/27/2020   Primary osteoarthritis of right knee 11/16/2020   H/O: hysterectomy 09/21/2020   Vaccine counseling 11/20/2019   Encounter for screening mammogram for malignant neoplasm of breast 11/20/2019   Gout of foot 11/20/2019   Encounter for hepatitis C screening test for low risk patient 11/20/2019   Primary osteoarthritis of both knees 05/14/2019    Diverticulosis 11/19/2018   Vitamin D deficiency 11/24/2016   S/P right knee arthroscopy 12/13/2015   Obesity with serious comorbidity 05/03/2015   Overactive bladder 05/03/2015   Essential hypertension 05/03/2015   Routine general medical examination at a health care facility 05/03/2015   Arthritis, senescent 05/03/2015   Hypertriglyceridemia 05/03/2015   Impaired fasting blood sugar 05/03/2015   Past Medical History:  Diagnosis Date   Acanthosis nigricans    Anemia    Arthritis of knee    External hemorrhoid    History of blood transfusion 2009   2 units after hysterectomy   Hypertension    Obesity    Plantar fasciitis of left foot    Sleep apnea 2010   borderline no cpap used   Wears glasses     Family History  Problem Relation Age of Onset   Cancer Mother        liver and lung   Hypertension Mother    Diabetes Father    Kidney failure Father    Hypertension Brother    Hypertension Brother    Hypertension Brother    Cancer Maternal Aunt        thyroid   Diabetes Maternal Aunt    Hypertension Maternal Aunt    Heart disease Maternal Grandmother    Diabetes Maternal Uncle    Hypertension Maternal Uncle    Stroke Neg Hx     Past Surgical History:  Procedure Laterality Date   CESAREAN SECTION     x 2   COLONOSCOPY  2009   done for anemia eval; Dr. Benson [place]   COLONOSCOPY  01/25/2018   multiple aphthous ulcers in terminal ileum likely d/t NSAIDs, diverticulosis in sigmoid colon, Dr. Sedalia Cellar   KNEE SURGERY  10/2015   knot removed from R knee   LAPAROTOMY Left 07/30/2012   Procedure: LEFT SALPINGO OOPHERECTOMY,  OMENTECTOMY, AND LYSIS OF ADHESIONS;  Surgeon: Alvino Chapel, MD;  Location: [facility];  Service: Gynecology;  Laterality: Left;   OOPHORECTOMY  2014   left    TOTAL ABDOMINAL HYSTERECTOMY  2009   TOTAL KNEE ARTHROPLASTY Right 01/14/2021   Procedure: Right TOTAL KNEE ARTHROPLASTY;  Surgeon: [name], MD;  Location: [facility];   Service: Orthopedics;  Laterality: Right;  RNFA   Social History   Occupational History   Not on file  Tobacco Use   Smoking status: Former    Packs/day: 0.25    Years: 4.00    Total pack years: 1.00    Types: Cigarettes    Quit date: 09/29/2011    Years since quitting: 9.9   Smokeless tobacco: Never  Vaping Use   Vaping Use: Never used  Substance and Sexual Activity   Alcohol use: No    Alcohol/week: 2.0 standard drinks of alcohol    Types: 2 Shots of liquor per week   Drug use: No  Sexual activity: Not Currently

## 2021-09-08 ENCOUNTER — Other Ambulatory Visit: Payer: Self-pay | Admitting: Orthopaedic Surgery

## 2021-09-08 ENCOUNTER — Telehealth: Payer: Self-pay | Admitting: Physician Assistant

## 2021-09-08 ENCOUNTER — Other Ambulatory Visit: Payer: Self-pay | Admitting: Physician Assistant

## 2021-09-08 MED ORDER — MELOXICAM 7.5 MG PO TABS: 7.5000 mg | ORAL_TABLET | Freq: Two times a day (BID) | ORAL | 2 refills | Status: DC

## 2021-09-08 NOTE — Telephone Encounter (Signed)
Patient called. Says her  RX wasn't called in. Her call back number is [phone] Meloxicam

## 2021-09-08 NOTE — Telephone Encounter (Signed)
I talked to Kayla Larsen it was suppose to be meloxicam. He will send it in now. Pt was called and informed and stated understanding

## 2021-09-14 ENCOUNTER — Encounter: Payer: Self-pay | Admitting: Internal Medicine

## 2021-09-26 ENCOUNTER — Other Ambulatory Visit: Payer: Self-pay | Admitting: Medical

## 2021-09-29 ENCOUNTER — Other Ambulatory Visit: Payer: Self-pay | Admitting: Medical

## 2021-10-10 ENCOUNTER — Ambulatory Visit: Payer: 59 | Admitting: Orthopaedic Surgery

## 2021-10-18 ENCOUNTER — Encounter: Payer: Self-pay | Admitting: Internal Medicine

## 2021-10-29 ENCOUNTER — Other Ambulatory Visit: Payer: Self-pay | Admitting: Medical

## 2021-10-31 ENCOUNTER — Encounter: Payer: Self-pay | Admitting: Orthopaedic Surgery

## 2021-10-31 ENCOUNTER — Ambulatory Visit (INDEPENDENT_AMBULATORY_CARE_PROVIDER_SITE_OTHER): Payer: 59 | Admitting: Orthopaedic Surgery

## 2021-10-31 DIAGNOSIS — M1712 Unilateral primary osteoarthritis, left knee: Secondary | ICD-10-CM

## 2021-10-31 NOTE — Progress Notes (Signed)
The patient is well-known to me.  We replaced her right knee in January of this year.  She has a little bit of pain on the outside of that knee.  Her left knee has well-documented known end-stage bone-on-bone wear with arthritis of that left knee.  We last injected a steroid that knee just 6 weeks ago in August.  At this point her left knee pain is daily and it is detrimentally affecting her mobility, her quality of life, and her actives daily living.  Review the x-rays again of the left knee shows severe end-stage arthritis with bone-on-bone wear of the medial and lateral compartments and patellofemoral joint.  There is varus malalignment.  There are cystic changes and osteophytes in all 3 compartments.  Her left knee has varus malalignment that is correctable.  He has significant medial and lateral tenderness and posterior tenderness as well as patellofemoral crepitation.  Her right operative knee has minimal swelling and excellent range of motion.  Her right operative knee is ligamentously stable.  At this point I agree with proceeding with a left knee replacement.  She would like to wait until early January 2024.  We will work on getting that scheduled.  All questions and concerns were answered and addressed.

## 2021-11-20 ENCOUNTER — Other Ambulatory Visit: Payer: Self-pay | Admitting: Physician Assistant

## 2021-11-20 ENCOUNTER — Other Ambulatory Visit: Payer: Self-pay | Admitting: Medical

## 2021-11-21 NOTE — Telephone Encounter (Signed)
Pt has upcoming appt in December

## 2021-11-28 ENCOUNTER — Other Ambulatory Visit: Payer: Self-pay | Admitting: Medical

## 2021-11-28 NOTE — Telephone Encounter (Signed)
Has an appt in December

## 2021-12-07 ENCOUNTER — Other Ambulatory Visit: Payer: Self-pay | Admitting: Physician Assistant

## 2021-12-13 ENCOUNTER — Other Ambulatory Visit: Payer: Self-pay | Admitting: Medical

## 2021-12-14 NOTE — Telephone Encounter (Signed)
Refill request last apt 12/27/20 next apt 12/29/21.

## 2021-12-28 ENCOUNTER — Other Ambulatory Visit: Payer: Self-pay | Admitting: Medical

## 2021-12-29 ENCOUNTER — Ambulatory Visit (INDEPENDENT_AMBULATORY_CARE_PROVIDER_SITE_OTHER): Payer: 59 | Admitting: Medical

## 2021-12-29 ENCOUNTER — Encounter: Payer: Self-pay | Admitting: Medical

## 2021-12-29 VITALS — BP 124/80 | HR 61 | Ht 61.0 in | Wt 227.8 lb

## 2021-12-29 DIAGNOSIS — Z136 Encounter for screening for cardiovascular disorders: Secondary | ICD-10-CM

## 2021-12-29 DIAGNOSIS — M109 Gout, unspecified: Secondary | ICD-10-CM

## 2021-12-29 DIAGNOSIS — K5790 Diverticulosis of intestine, part unspecified, without perforation or abscess without bleeding: Secondary | ICD-10-CM

## 2021-12-29 DIAGNOSIS — Z131 Encounter for screening for diabetes mellitus: Secondary | ICD-10-CM

## 2021-12-29 DIAGNOSIS — M1990 Unspecified osteoarthritis, unspecified site: Secondary | ICD-10-CM

## 2021-12-29 DIAGNOSIS — R7301 Impaired fasting glucose: Secondary | ICD-10-CM

## 2021-12-29 DIAGNOSIS — Z7185 Encounter for immunization safety counseling: Secondary | ICD-10-CM

## 2021-12-29 DIAGNOSIS — N3281 Overactive bladder: Secondary | ICD-10-CM

## 2021-12-29 DIAGNOSIS — Z0000 Encounter for general adult medical examination without abnormal findings: Secondary | ICD-10-CM | POA: Diagnosis not present

## 2021-12-29 DIAGNOSIS — E781 Pure hyperglyceridemia: Secondary | ICD-10-CM

## 2021-12-29 DIAGNOSIS — I10 Essential (primary) hypertension: Secondary | ICD-10-CM

## 2021-12-29 DIAGNOSIS — E559 Vitamin D deficiency, unspecified: Secondary | ICD-10-CM

## 2021-12-29 DIAGNOSIS — E611 Iron deficiency: Secondary | ICD-10-CM

## 2021-12-29 DIAGNOSIS — Z96651 Presence of right artificial knee joint: Secondary | ICD-10-CM

## 2021-12-29 DIAGNOSIS — Z90710 Acquired absence of both cervix and uterus: Secondary | ICD-10-CM

## 2021-12-29 DIAGNOSIS — E876 Hypokalemia: Secondary | ICD-10-CM

## 2021-12-29 LAB — POCT URINALYSIS DIP (PROADVANTAGE DEVICE)
Bilirubin, UA: NEGATIVE
Blood, UA: NEGATIVE
Glucose, UA: NEGATIVE mg/dL
Ketones, POC UA: NEGATIVE mg/dL
Leukocytes, UA: NEGATIVE
Nitrite, UA: NEGATIVE
Specific Gravity, Urine: 1.03
Urobilinogen, Ur: 0.2
pH, UA: 6.5 (ref 5.0–8.0)

## 2021-12-29 NOTE — Progress Notes (Signed)
Subjective:   HPI  Kayla Larsen is a 51 y.o. female who presents for Chief Complaint  Patient presents with   Annual Exam    Fasting. No additional concerns.     Patient Care Team: Demetria Lightsey, Leward Quan as PCP - General (Family Medicine) Sees dentist Sees eye doctor GI, Dr. Bandon [facility], gynecology Dr. Gershon Mussel, podiatry Dr. Zollie Beckers, Orthopedics   Concerns: HTN - compliant with medication.  Home readings routinely 120/80 range.  No chest pain, no palpitation, no edema.  History of iron deficiency anemia-taking iron daily  Compliant with Klor-Con daily  Compliant with Detrol daily for overactive bladder  Having left TKR in January 2024.  Had right knee TKR this year  Reviewed their medical, surgical, family, social, medication, and allergy history and updated chart as appropriate.  Past Medical History:  Diagnosis Date   Acanthosis nigricans    Anemia    Arthritis of knee    External hemorrhoid    History of blood transfusion 2009   2 units after hysterectomy   Hypertension    Obesity    Plantar fasciitis of left foot    Sleep apnea 2010   borderline no cpap used   Wears glasses     Past Surgical History:  Procedure Laterality Date   CESAREAN SECTION     x 2   COLONOSCOPY  2009   done for anemia eval; Dr. Benson Norway   COLONOSCOPY  01/25/2018   multiple aphthous ulcers in terminal ileum likely d/t NSAIDs, diverticulosis in sigmoid colon, Dr. [place] Cellar   KNEE SURGERY  10/2015   knot removed from R knee   LAPAROTOMY Left 07/30/2012   Procedure: LEFT SALPINGO OOPHERECTOMY,  OMENTECTOMY, AND LYSIS OF ADHESIONS;  Surgeon: Alvino Chapel, MD;  Location: [facility];  Service: Gynecology;  Laterality: Left;   OOPHORECTOMY  2014   left    TOTAL ABDOMINAL HYSTERECTOMY  2009   TOTAL KNEE ARTHROPLASTY Right 01/14/2021   Procedure: Right TOTAL KNEE ARTHROPLASTY;  Surgeon: [name], MD;  Location: [facility];  Service:  Orthopedics;  Laterality: Right;  RNFA     Family History  Problem Relation Age of Onset   Cancer Mother        liver and lung   Hypertension Mother    Diabetes Father    Kidney failure Father    Hypertension Brother    Hypertension Brother    Hypertension Brother    Cancer Maternal Aunt        thyroid   Diabetes Maternal Aunt    Hypertension Maternal Aunt    Heart disease Maternal Grandmother    Diabetes Maternal Uncle    Hypertension Maternal Uncle    Stroke Neg Hx      Current Outpatient Medications:    allopurinol (ZYLOPRIM) 100 MG tablet, Take 1 tablet (100 mg total) by mouth daily., Disp: 90 tablet, Rfl: 3   Ascorbic Acid (VITAMIN C PO), Take 2,000 mg by mouth daily., Disp: , Rfl:    Cholecalciferol (VITAMIN D) 50 MCG (2000 UT) tablet, Take 2,000 Units by mouth daily., Disp: , Rfl:    CRANBERRY PO, Take 1 capsule by mouth daily., Disp: , Rfl:    diclofenac Sodium (VOLTAREN) 1 % GEL, APPLY TOPICALLY TO THE AFFECTED AREA DAILY AS NEEDED FOR PAIN (Patient taking differently: Apply 1 Application topically daily.), Disp: 100 g, Rfl: 0   ferrous sulfate 325 (65 FE) MG tablet, Take 325 mg by mouth daily.,  Disp: , Rfl:    meloxicam (MOBIC) 7.5 MG tablet, TAKE 1 TABLET(7.5 MG) BY MOUTH TWICE DAILY, Disp: 60 tablet, Rfl: 2   Multiple Vitamin (MULTIVITAMIN WITH MINERALS) TABS, Take 1 tablet by mouth daily. One A Day for Women 50+, Disp: , Rfl:    Olmesartan-amLODIPine-HCTZ 40-5-12.5 MG TABS, TAKE 1 TABLET BY MOUTH DAILY, Disp: 90 tablet, Rfl: 0   potassium chloride (KLOR-CON) 10 MEQ tablet, TAKE 1 TABLET(10 MEQ) BY MOUTH DAILY, Disp: 90 tablet, Rfl: 0   tolterodine (DETROL LA) 4 MG 24 hr capsule, TAKE 1 CAPSULE(4 MG) BY MOUTH DAILY, Disp: 90 capsule, Rfl: 0   amoxicillin (AMOXIL) 500 MG capsule, Take 2,000 mg by mouth See admin instructions. Take 4 capsules (2000 mg) by mouth 1 hour prior to dental appointments. (Patient not taking: Reported on 12/29/2021), Disp: , Rfl:   Allergies   Allergen Reactions   Amlodipine     Ankle swelling at '10mg'$    Lisinopril     Cough      Review of Systems Constitutional: -fever, -chills, -sweats, -unexpected weight change, -decreased appetite, -fatigue Allergy: -sneezing, -itching, -congestion Dermatology: -changing moles, --rash, -lumps ENT: -runny nose, -ear pain, -sore throat, -hoarseness, -sinus pain, -teeth pain, - ringing in ears, -hearing loss, -nosebleeds Cardiology: -chest pain, -palpitations, -swelling, -difficulty breathing when lying flat, -waking up short of breath Respiratory: -cough, -shortness of breath, -difficulty breathing with exercise or exertion, -wheezing, -coughing up blood Gastroenterology: -abdominal pain, -nausea, -vomiting, -diarrhea, -constipation, -blood in stool, -changes in bowel movement, -difficulty swallowing or eating Hematology: -bleeding, -bruising  Musculoskeletal: +joint aches, -muscle aches, -joint swelling, -back pain, -neck pain, -cramping, -changes in gait Ophthalmology: denies vision changes, eye redness, itching, discharge Urology: -burning with urination, -difficulty urinating, -blood in urine, -urinary frequency, -urgency, -incontinence Neurology: -headache, -weakness, -tingling, -numbness, -memory loss, -falls, -dizziness Psychology: -depressed mood, -agitation, -sleep problems Breast/gyn: -breast tendnerss, -discharge, -lumps, -vaginal discharge,- irregular periods, -heavy periods     Objective:  BP 124/80   Pulse 61   Ht '5\' 1"'$  (1.549 m)   Wt 227 lb 12.8 oz (103.3 kg)   SpO2 100% Comment: room air  BMI 43.04 kg/m   BP Readings from Last 3 Encounters:  12/29/21 124/80  01/17/21 134/61  12/31/20 (!) 161/84   Wt Readings from Last 3 Encounters:  12/29/21 227 lb 12.8 oz (103.3 kg)  08/22/21 229 lb (103.9 kg)  01/14/21 220 lb (99.8 kg)    General appearance: alert, no distress, WD/WN, African American female Skin: unremarkable, tattoos right volar distal forearm, right  lateral forearm, posterior neck/upper back HEENT: normocephalic, conjunctiva/corneas normal, sclerae anicteric, PERRLA, EOMi, nares patent, no discharge or erythema, pharynx normal, moderately enlarged tonsils bilat Oral cavity: MMM, tongue normal, teeth in good repair, no small airway Neck: supple, no lymphadenopathy, no thyromegaly, no masses, normal ROM, no bruits Chest: non tender, normal shape and expansion Heart: RRR, normal S1, S2, no murmurs Lungs: CTA bilaterally, no wheezes, rhonchi, or rales Abdomen: +bs, soft, non tender, non distended, no masses, no hepatomegaly, no splenomegaly, no bruits Back: non tender, normal ROM, no scoliosis Musculoskeletal: limited exam unremarkable, cautious with right knee, cautious gait given right knee pains Extremities: no edema, no cyanosis, no clubbing Pulses: 2+ symmetric, upper and 1+ bilat lower extremities, normal cap refill Neurological: alert, oriented x 3, CN2-12 intact, strength normal upper extremities and lower extremities, sensation normal throughout, DTRs 2+ throughout, no cerebellar signs, gait normal Psychiatric: normal affect, behavior normal, pleasant  Breast/gyn/rectal - deferred to gynecology  EKG reviewed, no change from prior EKG    Assessment and Plan :   Encounter Diagnoses  Name Primary?   Encounter for health maintenance examination in adult Yes   Overactive bladder    Gout of foot, unspecified cause, unspecified chronicity, unspecified laterality    Osteoarthritis, unspecified osteoarthritis type, unspecified site    Impaired fasting blood sugar    Essential hypertension    Diverticulosis    Vitamin D deficiency    Vaccine counseling    Status post total right knee replacement    Screening for heart disease    Screening for diabetes mellitus    Iron deficiency    Hypokalemia    Hypertriglyceridemia    H/O: hysterectomy     Physical exam - discussed and counseled on healthy lifestyle, diet, exercise,  preventative care, vaccinations, sick and well care, proper use of emergency dept and after hours care, and addressed their concerns.    Health screening: Advised they see their eye doctor yearly for routine vision care. Advised they see their dentist yearly for routine dental care including hygiene visits twice yearly. See your gynecologist yearly for routine gynecological care.   Cancer screening: Counseled on self breast exams, mammograms, cervical cancer screening  Colonoscopy:  Reviewed 01/25/18 colonoscopy showing multiple aphthous ulcers in terminal ileum likely due to NSAIDs, diverticulosis in sigmoid colon, Dr. [place] Cellar  Reviewed mammogram on file that is up to date  She is s/p hysterectomy  Use skin surveillance    Vaccinations: Immunization History  Administered Date(s) Administered   Influenza,inj,Quad PF,6+ Mos 09/10/2013, 09/30/2014, 09/08/2015, 09/25/2017, 09/18/2018, 12/25/2020   Influenza-Unspecified 09/19/2016, 09/25/2017, 10/25/2019   PFIZER(Purple Top)SARS-COV-2 Vaccination 03/14/2019, 04/09/2019   Tdap 02/12/2013   We will obtain copy of recent vaccines from pharmacy   Separate significant chronic issues discussed:  Hypertension-continue current medication olmesartan amlodipine HCTZ 40/5/12.5 mg daily  Obesity-counseled on diet and exercise recommendations with efforts to lose weight.  We discussed adding weight loss medicine such as TGGYIR after her surgery.  Discussed risk and benefits of medication.  Vitamin D deficiency-continue supplement and eat fish regularly  Overactive bladder-continue Detrol as usual  Arthritis-sees ortho and podiatry, upcoming TKR left knee in January 2024  History of gout-on allopurinol for prevention  Impaired glucose-updated labs today  Prior hypokalemia-updated labs today, continue potassium  Iron deficiency-currently on iron therapy daily.  I reviewed her 2020 colonoscopy.  Await lab results for further  recommendations   [place] was seen today for annual exam.  Diagnoses and all orders for this visit:  Encounter for health maintenance examination in adult -     Comprehensive metabolic panel -     CBC with Differential/Platelet -     Lipid panel -     Hemoglobin A1c -     Uric acid -     POCT Urinalysis DIP (Proadvantage Device) -     Iron -     VITAMIN D 25 Hydroxy (Vit-D Deficiency, Fractures)  Overactive bladder  Gout of foot, unspecified cause, unspecified chronicity, unspecified laterality  Osteoarthritis, unspecified osteoarthritis type, unspecified site  Impaired fasting blood sugar -     Hemoglobin A1c  Essential hypertension  Diverticulosis  Vitamin D deficiency -     VITAMIN D 25 Hydroxy (Vit-D Deficiency, Fractures)  Vaccine counseling  Status post total right knee replacement  Screening for heart disease  Screening for diabetes mellitus -     Hemoglobin A1c  Iron deficiency -     Iron  Hypokalemia -     Comprehensive metabolic panel  Hypertriglyceridemia  H/O: hysterectomy   Follow-up pending labs, yearly for physical

## 2021-12-29 NOTE — Progress Notes (Signed)
Sent message, via epic in basket, requesting orders in epic from surgeon.  

## 2021-12-30 ENCOUNTER — Other Ambulatory Visit: Payer: Self-pay | Admitting: Medical

## 2021-12-30 LAB — COMPREHENSIVE METABOLIC PANEL
ALT: 23 IU/L (ref 0–32)
AST: 16 IU/L (ref 0–40)
Albumin/Globulin Ratio: 1.8 (ref 1.2–2.2)
Albumin: 4.7 g/dL (ref 3.8–4.9)
Alkaline Phosphatase: 58 IU/L (ref 44–121)
BUN/Creatinine Ratio: 22 (ref 9–23)
BUN: 15 mg/dL (ref 6–24)
Bilirubin Total: 0.4 mg/dL (ref 0.0–1.2)
CO2: 25 mmol/L (ref 20–29)
Calcium: 10.4 mg/dL — ABNORMAL HIGH (ref 8.7–10.2)
Chloride: 103 mmol/L (ref 96–106)
Creatinine, Ser: 0.67 mg/dL (ref 0.57–1.00)
Globulin, Total: 2.6 g/dL (ref 1.5–4.5)
Glucose: 86 mg/dL (ref 70–99)
Potassium: 3.9 mmol/L (ref 3.5–5.2)
Sodium: 142 mmol/L (ref 134–144)
Total Protein: 7.3 g/dL (ref 6.0–8.5)
eGFR: 104 mL/min/{1.73_m2} (ref >59)

## 2021-12-30 LAB — CBC WITH DIFFERENTIAL/PLATELET
Basophils Absolute: 0 10*3/uL (ref 0.0–0.2)
Basos: 0 %
EOS (ABSOLUTE): 0.1 10*3/uL (ref 0.0–0.4)
Eos: 2 %
Hematocrit: 36.4 % (ref 34.0–46.6)
Hemoglobin: 11.5 g/dL (ref 11.1–15.9)
Immature Grans (Abs): 0 10*3/uL (ref 0.0–0.1)
Immature Granulocytes: 0 %
Lymphocytes Absolute: 4.1 10*3/uL — ABNORMAL HIGH (ref 0.7–3.1)
Lymphs: 59 %
MCH: 22.9 pg — ABNORMAL LOW (ref 26.6–33.0)
MCHC: 31.6 g/dL (ref 31.5–35.7)
MCV: 72 fL — ABNORMAL LOW (ref 79–97)
Monocytes Absolute: 0.6 10*3/uL (ref 0.1–0.9)
Monocytes: 8 %
Neutrophils Absolute: 2.1 10*3/uL (ref 1.4–7.0)
Neutrophils: 31 %
Platelets: 381 10*3/uL (ref 150–450)
RBC: 5.03 x10E6/uL (ref 3.77–5.28)
RDW: 14.3 % (ref 11.7–15.4)
WBC: 6.9 10*3/uL (ref 3.4–10.8)

## 2021-12-30 LAB — LIPID PANEL
Chol/HDL Ratio: 2.7 ratio (ref 0.0–4.4)
Cholesterol, Total: 152 mg/dL (ref 100–199)
HDL: 57 mg/dL (ref >39)
LDL Chol Calc (NIH): 67 mg/dL (ref 0–99)
Triglycerides: 170 mg/dL — ABNORMAL HIGH (ref 0–149)
VLDL Cholesterol Cal: 28 mg/dL (ref 5–40)

## 2021-12-30 LAB — URIC ACID: Uric Acid: 4.4 mg/dL (ref 3.0–7.2)

## 2021-12-30 LAB — HEMOGLOBIN A1C
Est. average glucose Bld gHb Est-mCnc: 123 mg/dL
Hgb A1c MFr Bld: 5.9 % — ABNORMAL HIGH (ref 4.8–5.6)

## 2021-12-30 LAB — IRON: Iron: 71 ug/dL (ref 27–159)

## 2021-12-30 LAB — VITAMIN D 25 HYDROXY (VIT D DEFICIENCY, FRACTURES): Vit D, 25-Hydroxy: 34.8 ng/mL (ref 30.0–100.0)

## 2021-12-30 MED ORDER — VITAMIN D 50 MCG (2000 UT) PO TABS: 2000.0000 [IU] | ORAL_TABLET | Freq: Every day | ORAL | 3 refills | Status: DC

## 2021-12-30 MED ORDER — WEGOVY 0.25 MG/0.5ML ~~LOC~~ SOAJ: 0.2500 mg | SUBCUTANEOUS | 0 refills | Status: DC

## 2021-12-30 MED ORDER — FERROUS SULFATE 325 (65 FE) MG PO TABS: 325.0000 mg | ORAL_TABLET | Freq: Every day | ORAL | 0 refills | Status: DC

## 2021-12-30 NOTE — Patient Instructions (Signed)
DUE TO COVID-19 ONLY TWO VISITORS  (aged 53 and older)  ARE ALLOWED TO COME WITH YOU AND STAY IN THE WAITING ROOM ONLY DURING PRE OP AND PROCEDURE.   **NO VISITORS ARE ALLOWED IN THE SHORT STAY AREA OR RECOVERY ROOM!!**  IF YOU WILL BE ADMITTED INTO THE HOSPITAL YOU ARE ALLOWED ONLY FOUR SUPPORT PEOPLE DURING VISITATION HOURS ONLY (7 AM -8PM)   The support person(s) must pass our screening, gel in and out, and wear a mask at all times, including in the patient's room. Patients must also wear a mask when staff or their support person are in the room. Visitors GUEST BADGE MUST BE WORN VISIBLY  One adult visitor may remain with you overnight and MUST be in the room by 8 P.M.     Your procedure is scheduled on: 01/13/22   Report to [facility] Main Entrance    Report to admitting at 7:45 AM   Call this number if you have problems the morning of surgery [phone]   Do not eat food :After Midnight.   After Midnight you may have the following liquids until _7:15_____ AM/  DAY OF SURGERY  Water Black Coffee (sugar ok, NO MILK/CREAM OR CREAMERS)  Tea (sugar ok, NO MILK/CREAM OR CREAMERS) regular and decaf                             Plain Jell-O (NO RED)                                           Fruit ices (not with fruit pulp, NO RED)                                     Popsicles (NO RED)                                                                  Juice: apple, WHITE grape, WHITE cranberry Sports drinks like Gatorade (NO RED)                  The day of surgery:  Drink ONE  G2 at  7:00 AM the morning of surgery. Drink in one sitting. Do not sip.  This drink was given to you during your hospital  pre-op appointment visit. Nothing else to drink after completing the   G2. At 7:15 AM          If you have questions, please contact your surgeon's office.   FOLLOW BOWEL PREP AND ANY ADDITIONAL PRE OP INSTRUCTIONS YOU RECEIVED FROM YOUR SURGEON'S OFFICE!!!     Oral  Hygiene is also important to reduce your risk of infection.                                    Remember - BRUSH YOUR TEETH THE MORNING OF SURGERY WITH YOUR REGULAR TOOTHPASTE  DENTURES WILL BE REMOVED PRIOR TO SURGERY PLEASE DO NOT APPLY "Poly grip"  OR ADHESIVES!!!   Do NOT smoke after Midnight   Take these medicines the morning of surgery with A SIP OF WATER: Allopurinol, Tolterodine-Detrol  DO NOT TAKE ANY ORAL DIABETIC MEDICATIONS DAY OF YOUR SURGERY Stop Wegovy 7 day prior to day of surgery  Bring CPAP mask and tubing day of surgery.                              You may not have any metal on your body including hair pins, jewelry, and body piercing             Do not wear make-up, lotions, powders, perfumes/cologne, or deodorant  Do not wear nail polish including gel and S&S, artificial/acrylic nails, or any other type of covering on natural nails including finger and toenails. If you have artificial nails, gel coating, etc. that needs to be removed by a nail salon please have this removed prior to surgery or surgery may need to be canceled/ delayed if the surgeon/ anesthesia feels like they are unable to be safely monitored.   Do not shave  48 hours prior to surgery.     Do not bring valuables to the hospital. [place].   Contacts, glasses, or bridgework may not be worn into surgery.   Bring small overnight bag day of surgery.   DO NOT [place].     Patients discharged on the day of surgery will not be allowed to drive home.  Someone NEEDS to stay with you for the first 24 hours after anesthesia.   Special Instructions: Bring a copy of your healthcare power of attorney and living will documents  the day of surgery if you haven't scanned them before.              Please read over the following fact sheets you were given: IF YOU HAVE QUESTIONS ABOUT YOUR PRE-OP INSTRUCTIONS PLEASE CALL  [phone]    [facility] - Preparing for Surgery Before surgery, you can play an important role.  Because skin is not sterile, your skin needs to be as free of germs as possible.  You can reduce the number of germs on your skin by washing with CHG (chlorahexidine gluconate) soap before surgery.  CHG is an antiseptic cleaner which kills germs and bonds with the skin to continue killing germs even after washing. Please DO NOT use if you have an allergy to CHG or antibacterial soaps.  If your skin becomes reddened/irritated stop using the CHG and inform your nurse when you arrive at Short Stay. Do not shave (including legs and underarms) for at least 48 hours prior to the first CHG shower.   Please follow these instructions carefully:  1.  Shower with CHG Soap the night before surgery and the  morning of Surgery.  2.  If you choose to wash your hair, wash your hair first as usual with your  normal  shampoo.  3.  After you shampoo, rinse your hair and body thoroughly to remove the  shampoo.                            4.  Use CHG as you would any other liquid soap.  You can apply chg directly  to the skin and  wash                       Gently with a scrungie or clean washcloth.  5.  Apply the CHG Soap to your body ONLY FROM THE NECK DOWN.   Do not use on face/ open                           Wound or open sores. Avoid contact with eyes, ears mouth and genitals (private parts).                       Wash face,  Genitals (private parts) with your normal soap.             6.  Wash thoroughly, paying special attention to the area where your surgery  will be performed.  7.  Thoroughly rinse your body with warm water from the neck down.  8.  DO NOT shower/wash with your normal soap after using and rinsing off  the CHG Soap.                9.  Pat yourself dry with a clean towel.            10.  Wear clean pajamas.            11.  Place clean sheets on your bed the night of your first shower and do not   sleep with pets. Day of Surgery : Do not apply any lotions/deodorants the morning of surgery.  Please wear clean clothes to the hospital/surgery center.  FAILURE TO FOLLOW THESE INSTRUCTIONS MAY RESULT IN THE CANCELLATION OF YOUR SURGERY    ________________________________________________________________________  Incentive Spirometer  An incentive spirometer is a tool that can help keep your lungs clear and active. This tool measures how well you are filling your lungs with each breath. Taking long deep breaths may help reverse or decrease the chance of developing breathing (pulmonary) problems (especially infection) following: A long period of time when you are unable to move or be active. BEFORE THE PROCEDURE  If the spirometer includes an indicator to show your best effort, your nurse or respiratory therapist will set it to a desired goal. If possible, sit up straight or lean slightly forward. Try not to slouch. Hold the incentive spirometer in an upright position. INSTRUCTIONS FOR USE  Sit on the edge of your bed if possible, or sit up as far as you can in bed or on a chair. Hold the incentive spirometer in an upright position. Breathe out normally. Place the mouthpiece in your mouth and seal your lips tightly around it. Breathe in slowly and as deeply as possible, raising the piston or the ball toward the top of the column. Hold your breath for 3-5 seconds or for as long as possible. Allow the piston or ball to fall to the bottom of the column. Remove the mouthpiece from your mouth and breathe out normally. Rest for a few seconds and repeat Steps 1 through 7 at least 10 times every 1-2 hours when you are awake. Take your time and take a few normal breaths between deep breaths. The spirometer may include an indicator to show your best effort. Use the indicator as a goal to work toward during each repetition. After each set of 10 deep breaths, practice coughing to be sure your lungs  are clear. If you have an incision (the cut  made at the time of surgery), support your incision when coughing by placing a pillow or rolled up towels firmly against it. Once you are able to get out of bed, walk around indoors and cough well. You may stop using the incentive spirometer when instructed by your caregiver.  RISKS AND COMPLICATIONS Take your time so you do not get dizzy or light-headed. If you are in pain, you may need to take or ask for pain medication before doing incentive spirometry. It is harder to take a deep breath if you are having pain. AFTER USE Rest and breathe slowly and easily. It can be helpful to keep track of a log of your progress. Your caregiver can provide you with a simple table to help with this. If you are using the spirometer at home, follow these instructions: [place] IF:  You are having difficultly using the spirometer. You have trouble using the spirometer as often as instructed. Your pain medication is not giving enough relief while using the spirometer. You develop fever of 100.5 F (38.1 C) or higher. SEEK IMMEDIATE MEDICAL CARE IF:  You cough up bloody sputum that had not been present before. You develop fever of 102 F (38.9 C) or greater. You develop worsening pain at or near the incision site. MAKE SURE YOU:  Understand these instructions. Will watch your condition. Will get help right away if you are not doing well or get worse. Document Released: 05/08/2006 Document Revised: 03/20/2011 Document Reviewed: 07/09/2006 [facility] Patient Information 2014 [place], [place].   ________________________________________________________________________

## 2021-12-30 NOTE — Progress Notes (Signed)
Results sent through MyChart

## 2021-12-30 NOTE — Addendum Note (Signed)
Addended by: Minette Headland A on: 12/30/2021 10:44 AM   Modules accepted: Orders

## 2022-01-03 ENCOUNTER — Other Ambulatory Visit: Payer: Self-pay

## 2022-01-03 ENCOUNTER — Encounter (HOSPITAL_COMMUNITY)
Admission: RE | Admit: 2022-01-03 | Discharge: 2022-01-03 | Disposition: A | Discharge status: Home / Self Care | Payer: 59 | Source: Ambulatory Visit | Attending: Orthopaedic Surgery | Admitting: Orthopaedic Surgery

## 2022-01-03 ENCOUNTER — Encounter (HOSPITAL_COMMUNITY): Payer: Self-pay

## 2022-01-03 ENCOUNTER — Telehealth: Payer: Self-pay | Admitting: Medical

## 2022-01-03 DIAGNOSIS — Z01818 Encounter for other preprocedural examination: Secondary | ICD-10-CM | POA: Diagnosis present

## 2022-01-03 DIAGNOSIS — I10 Essential (primary) hypertension: Secondary | ICD-10-CM | POA: Diagnosis not present

## 2022-01-03 LAB — SURGICAL PCR SCREEN
MRSA, PCR: NEGATIVE
Staphylococcus aureus: NEGATIVE

## 2022-01-03 NOTE — Telephone Encounter (Signed)
Pt called Wegovy on back order but pharmacy does have  zepbound [facility]  [address]

## 2022-01-03 NOTE — Progress Notes (Signed)
Anesthesia note:  Bowel prep reminder:  NA  PCP - Chana Bode PA Cardiologist -none Other-   Chest x-ray - no EKG - 12/29/21-epic Stress Test - no ECHO - no Cardiac Cath - no CABG-no Pacemaker/ICD device last checked:NA  Sleep Study - yes CPAP - no she feels that she no longer needs it  Pt is pre diabetic-no She takes Wygovy forweight loss. She hasn't started it yet CBG at PAT visit- Fasting Blood Sugar at home- Checks Blood Sugar _____  Blood Thinner:no Blood Thinner Instructions: Aspirin Instructions: Last Dose:  Anesthesia review: No    Patient denies shortness of breath, fever, cough and chest pain at PAT appointment. Pt has no SOB with activitiy.   Patient verbalized understanding of instructions that were given to them at the PAT appointment. Patient was also instructed that they will need to review over the PAT instructions again at home before surgery.yes

## 2022-01-04 ENCOUNTER — Other Ambulatory Visit: Payer: Self-pay | Admitting: Medical

## 2022-01-04 ENCOUNTER — Telehealth: Payer: Self-pay | Admitting: Medical

## 2022-01-04 MED ORDER — ZEPBOUND 5 MG/0.5ML ~~LOC~~ SOAJ: 5.0000 mg | SUBCUTANEOUS | 0 refills | Status: DC

## 2022-01-04 MED ORDER — ZEPBOUND 2.5 MG/0.5ML ~~LOC~~ SOAJ: 2.5000 mg | SUBCUTANEOUS | 0 refills | Status: DC

## 2022-01-04 NOTE — Telephone Encounter (Signed)
Received fax from [place] requesting rx for [facility]

## 2022-01-05 ENCOUNTER — Other Ambulatory Visit: Payer: Self-pay | Admitting: Medical

## 2022-01-05 ENCOUNTER — Telehealth: Payer: Self-pay | Admitting: Internal Medicine

## 2022-01-05 ENCOUNTER — Other Ambulatory Visit: Payer: Self-pay | Admitting: Physician Assistant

## 2022-01-05 ENCOUNTER — Encounter: Payer: Self-pay | Admitting: Internal Medicine

## 2022-01-05 ENCOUNTER — Telehealth: Payer: Self-pay | Admitting: Medical

## 2022-01-05 DIAGNOSIS — Z6841 Body Mass Index (BMI) 40.0 and over, adult: Secondary | ICD-10-CM | POA: Insufficient documentation

## 2022-01-05 DIAGNOSIS — Z01818 Encounter for other preprocedural examination: Secondary | ICD-10-CM

## 2022-01-05 NOTE — Telephone Encounter (Signed)
P.A. Zepbound. Sent through covermymeds. Waiting on response

## 2022-01-05 NOTE — Telephone Encounter (Signed)
[facility] requested records received

## 2022-01-06 NOTE — Telephone Encounter (Signed)
Insurance has denied Zepbound for pt. Pt must try up to 3 preferred drugs in her plan:  List- Kayla Larsen  Plan will only cover this drug when you have tried other drugs and they did not work well for you or Provider give a medical reason why she can not take the other drugs.   Please Advise

## 2022-01-10 ENCOUNTER — Other Ambulatory Visit: Payer: Self-pay

## 2022-01-10 MED ORDER — WEGOVY 0.25 MG/0.5ML ~~LOC~~ SOAJ: 0.2500 mg | SUBCUTANEOUS | 0 refills | Status: DC

## 2022-01-10 NOTE — Telephone Encounter (Signed)
Kayla Larsen is on national back order and can't get in stock. Insurance is requesting [facility] or [facility]

## 2022-01-10 NOTE — Telephone Encounter (Signed)
I have resent wegovy in to [facility] for pt

## 2022-01-11 ENCOUNTER — Other Ambulatory Visit (HOSPITAL_COMMUNITY): Payer: Self-pay

## 2022-01-11 ENCOUNTER — Other Ambulatory Visit: Payer: Self-pay | Admitting: Medical

## 2022-01-11 MED ORDER — WEGOVY 0.25 MG/0.5ML ~~LOC~~ SOAJ
0.2500 mg | SUBCUTANEOUS | 0 refills | Status: DC
  Filled 2022-01-11 – 2022-03-09 (×2): qty 2, 28d supply, fill #0

## 2022-01-11 NOTE — Telephone Encounter (Signed)
[facility] has [facility]list with [facility] and pt was notified

## 2022-01-11 NOTE — Telephone Encounter (Signed)
[facility] pharmacies do no have lower doses.

## 2022-01-12 ENCOUNTER — Encounter: Payer: Self-pay | Admitting: Orthopaedic Surgery

## 2022-01-12 NOTE — H&P (Signed)
TOTAL KNEE ADMISSION H&P  Patient is being admitted for left total knee arthroplasty.  Subjective:  Chief Complaint:left knee pain.  HPI: Kayla Larsen, 52 y.o. female, has a history of pain and functional disability in the left knee due to arthritis and has failed non-surgical conservative treatments for greater than 12 weeks to includeNSAID's and/or analgesics, corticosteriod injections, viscosupplementation injections, flexibility and strengthening excercises, use of assistive devices, weight reduction as appropriate, and activity modification.  Onset of symptoms was gradual, starting 5 years ago with gradually worsening course since that time. The patient noted no past surgery on the left knee(s).  Patient currently rates pain in the left knee(s) at 10 out of 10 with activity. Patient has night pain, worsening of pain with activity and weight bearing, pain that interferes with activities of daily living, pain with passive range of motion, crepitus, and joint swelling.  Patient has evidence of subchondral sclerosis, periarticular osteophytes, and joint space narrowing by imaging studies. There is no active infection.  Patient Active Problem List   Diagnosis Date Noted   BMI 40.0-44.9, adult ([place]) 01/05/2022   Unilateral primary osteoarthritis, left knee 10/31/2021   Status post total right knee replacement 01/16/2021   Encounter for health maintenance examination in adult 12/27/2020   Screening for heart disease 12/27/2020   Iron deficiency 12/27/2020   Screening for diabetes mellitus 12/27/2020   BMI 38.0-38.9,adult 12/27/2020   Hypokalemia 12/27/2020   Primary osteoarthritis of right knee 11/16/2020   H/O: hysterectomy 09/21/2020   Vaccine counseling 11/20/2019   Encounter for screening mammogram for malignant neoplasm of breast 11/20/2019   Gout of foot 11/20/2019   Primary osteoarthritis of both knees 05/14/2019   Diverticulosis 11/19/2018   Vitamin D deficiency 11/24/2016    Overactive bladder 05/03/2015   Essential hypertension 05/03/2015   Arthritis, senescent 05/03/2015   Hypertriglyceridemia 05/03/2015   Impaired fasting blood sugar 05/03/2015   Past Medical History:  Diagnosis Date   Acanthosis nigricans    Anemia    Arthritis of knee    History of blood transfusion 2009   2 units after hysterectomy   Hypertension    Obesity    Wears glasses     Past Surgical History:  Procedure Laterality Date   CESAREAN SECTION     x 2   COLONOSCOPY  2009   done for anemia eval; Dr. Benson [place]   COLONOSCOPY  01/25/2018   multiple aphthous ulcers in terminal ileum likely d/t NSAIDs, diverticulosis in sigmoid colon, Dr. [place] Cellar   KNEE SURGERY  10/2015   knot removed from R knee   LAPAROTOMY Left 07/30/2012   Procedure: LEFT SALPINGO OOPHERECTOMY,  OMENTECTOMY, AND LYSIS OF ADHESIONS;  Surgeon: Alvino Chapel, MD;  Location: [facility];  Service: Gynecology;  Laterality: Left;   OOPHORECTOMY  2014   left    TOTAL ABDOMINAL HYSTERECTOMY  2009   TOTAL KNEE ARTHROPLASTY Right 01/14/2021   Procedure: Right TOTAL KNEE ARTHROPLASTY;  Surgeon: [name], MD;  Location: [facility];  Service: Orthopedics;  Laterality: Right;  RNFA    No current facility-administered medications for this encounter.   Current Outpatient Medications  Medication Sig Dispense Refill Last Dose   allopurinol (ZYLOPRIM) 100 MG tablet Take 1 tablet (100 mg total) by mouth daily. 90 tablet 3    amoxicillin (AMOXIL) 500 MG capsule Take 2,000 mg by mouth See admin instructions. Take 4 capsules (2000 mg) by mouth 1 hour prior to dental appointments. (Patient not taking: Reported on 12/29/2021)  Ascorbic Acid (VITAMIN C PO) Take 2,000 mg by mouth daily.      CRANBERRY PO Take 1 capsule by mouth daily.      meloxicam (MOBIC) 7.5 MG tablet TAKE 1 TABLET(7.5 MG) BY MOUTH TWICE DAILY 60 tablet 2    Multiple Vitamin (MULTIVITAMIN WITH MINERALS) TABS Take 1 tablet by mouth daily.  One A Day for Women 50+      Olmesartan-amLODIPine-HCTZ 40-5-12.5 MG TABS TAKE 1 TABLET BY MOUTH DAILY 90 tablet 0    potassium chloride (KLOR-CON) 10 MEQ tablet TAKE 1 TABLET(10 MEQ) BY MOUTH DAILY 90 tablet 0    tolterodine (DETROL LA) 4 MG 24 hr capsule TAKE 1 CAPSULE(4 MG) BY MOUTH DAILY 90 capsule 0    Cholecalciferol (VITAMIN D) 50 MCG (2000 UT) tablet Take 1 tablet (2,000 Units total) by mouth daily. 90 tablet 3    diclofenac Sodium (VOLTAREN) 1 % GEL Apply 1 Application topically daily. 100 g 1    ferrous sulfate 325 (65 FE) MG tablet Take 1 tablet (325 mg total) by mouth daily. 90 tablet 0    Semaglutide-Weight Management (WEGOVY) 0.25 MG/0.5ML SOAJ Inject 0.25 mg into the skin once a week. 2 mL 0    Allergies  Allergen Reactions   Amlodipine     Ankle swelling at '10mg'$    Lisinopril     Cough     Social History   Tobacco Use   Smoking status: Former    Packs/day: 0.25    Years: 4.00    Total pack years: 1.00    Types: Cigarettes    Quit date: 09/29/2011    Years since quitting: 10.2   Smokeless tobacco: Never  Substance Use Topics   Alcohol use: No    Alcohol/week: 2.0 standard drinks of alcohol    Types: 2 Shots of liquor per week    Family History  Problem Relation Age of Onset   Cancer Mother        liver and lung   Hypertension Mother    Diabetes Father    Kidney failure Father    Hypertension Brother    Hypertension Brother    Hypertension Brother    Cancer Maternal Aunt        thyroid   Diabetes Maternal Aunt    Hypertension Maternal Aunt    Heart disease Maternal Grandmother    Diabetes Maternal Uncle    Hypertension Maternal Uncle    Stroke Neg Hx      Review of Systems  Musculoskeletal:  Positive for gait problem and joint swelling.  All other systems reviewed and are negative.   Objective:  Physical Exam Vitals reviewed.  Constitutional:      Appearance: Normal appearance.  HENT:     Head: Normocephalic and atraumatic.  Eyes:      Extraocular Movements: Extraocular movements intact.     Pupils: Pupils are equal, round, and reactive to light.  Cardiovascular:     Rate and Rhythm: Normal rate and regular rhythm.  Pulmonary:     Effort: Pulmonary effort is normal.     Breath sounds: Normal breath sounds.  Abdominal:     Palpations: Abdomen is soft.  Musculoskeletal:     Cervical back: Normal range of motion and neck supple.     Left knee: Effusion, bony tenderness and crepitus present. Decreased range of motion. Tenderness present over the medial joint line, lateral joint line and patellar tendon. Abnormal alignment.  Neurological:     Mental Status: She  is alert and oriented to person, place, and time.  Psychiatric:        Behavior: Behavior normal.     Vital signs in last 24 hours:    Labs:   Estimated body mass index is 42.51 kg/m as calculated from the following:   Height as of 01/03/22: '5\' 1"'$  (1.549 m).   Weight as of 01/03/22: 102.1 kg.   Imaging Review Plain radiographs demonstrate severe degenerative joint disease of the left knee(s). The overall alignment issignificant varus. The bone quality appears to be good for age and reported activity level.      Assessment/Plan:  End stage arthritis, left knee   The patient history, physical examination, clinical judgment of the provider and imaging studies are consistent with end stage degenerative joint disease of the left knee(s) and total knee arthroplasty is deemed medically necessary. The treatment options including medical management, injection therapy arthroscopy and arthroplasty were discussed at length. The risks and benefits of total knee arthroplasty were presented and reviewed. The risks due to aseptic loosening, infection, stiffness, patella tracking problems, thromboembolic complications and other imponderables were discussed. The patient acknowledged the explanation, agreed to proceed with the plan and consent was signed. Patient is being  admitted for inpatient treatment for surgery, pain control, PT, OT, prophylactic antibiotics, VTE prophylaxis, progressive ambulation and ADL's and discharge planning. The patient is planning to be discharged home with home health services

## 2022-01-13 ENCOUNTER — Observation Stay (HOSPITAL_COMMUNITY): Payer: 59

## 2022-01-13 ENCOUNTER — Ambulatory Visit (HOSPITAL_COMMUNITY): Payer: 59 | Admitting: Anesthesiology

## 2022-01-13 ENCOUNTER — Other Ambulatory Visit: Payer: Self-pay

## 2022-01-13 ENCOUNTER — Inpatient Hospital Stay (HOSPITAL_COMMUNITY)
Admission: AD | Admit: 2022-01-13 | Discharge: 2022-01-16 | DRG: 470 | Disposition: A | Discharge status: Home Health | Payer: 59 | Source: Ambulatory Visit | Attending: Orthopaedic Surgery | Admitting: Orthopaedic Surgery

## 2022-01-13 ENCOUNTER — Encounter (HOSPITAL_COMMUNITY)
Admission: AD | Disposition: A | Discharge status: Home Health | Payer: Self-pay | Source: Ambulatory Visit | Attending: Orthopaedic Surgery

## 2022-01-13 ENCOUNTER — Encounter (HOSPITAL_COMMUNITY): Payer: Self-pay | Admitting: Orthopaedic Surgery

## 2022-01-13 DIAGNOSIS — Z8249 Family history of ischemic heart disease and other diseases of the circulatory system: Secondary | ICD-10-CM

## 2022-01-13 DIAGNOSIS — E781 Pure hyperglyceridemia: Secondary | ICD-10-CM | POA: Diagnosis present

## 2022-01-13 DIAGNOSIS — I10 Essential (primary) hypertension: Principal | ICD-10-CM | POA: Diagnosis present

## 2022-01-13 DIAGNOSIS — Z79899 Other long term (current) drug therapy: Secondary | ICD-10-CM

## 2022-01-13 DIAGNOSIS — Z87891 Personal history of nicotine dependence: Secondary | ICD-10-CM

## 2022-01-13 DIAGNOSIS — M1712 Unilateral primary osteoarthritis, left knee: Secondary | ICD-10-CM | POA: Diagnosis not present

## 2022-01-13 DIAGNOSIS — Z801 Family history of malignant neoplasm of trachea, bronchus and lung: Secondary | ICD-10-CM

## 2022-01-13 DIAGNOSIS — Z808 Family history of malignant neoplasm of other organs or systems: Secondary | ICD-10-CM

## 2022-01-13 DIAGNOSIS — Z01818 Encounter for other preprocedural examination: Secondary | ICD-10-CM

## 2022-01-13 DIAGNOSIS — Z791 Long term (current) use of non-steroidal anti-inflammatories (NSAID): Secondary | ICD-10-CM

## 2022-01-13 DIAGNOSIS — Z96652 Presence of left artificial knee joint: Secondary | ICD-10-CM

## 2022-01-13 DIAGNOSIS — Z803 Family history of malignant neoplasm of breast: Secondary | ICD-10-CM

## 2022-01-13 DIAGNOSIS — Z96651 Presence of right artificial knee joint: Secondary | ICD-10-CM | POA: Diagnosis present

## 2022-01-13 DIAGNOSIS — M109 Gout, unspecified: Secondary | ICD-10-CM | POA: Diagnosis present

## 2022-01-13 DIAGNOSIS — Z841 Family history of disorders of kidney and ureter: Secondary | ICD-10-CM

## 2022-01-13 DIAGNOSIS — Z7982 Long term (current) use of aspirin: Secondary | ICD-10-CM

## 2022-01-13 DIAGNOSIS — D649 Anemia, unspecified: Secondary | ICD-10-CM | POA: Diagnosis not present

## 2022-01-13 DIAGNOSIS — Z6841 Body Mass Index (BMI) 40.0 and over, adult: Secondary | ICD-10-CM

## 2022-01-13 DIAGNOSIS — Z7985 Long-term (current) use of injectable non-insulin antidiabetic drugs: Secondary | ICD-10-CM

## 2022-01-13 DIAGNOSIS — Z888 Allergy status to other drugs, medicaments and biological substances status: Secondary | ICD-10-CM

## 2022-01-13 DIAGNOSIS — Z833 Family history of diabetes mellitus: Secondary | ICD-10-CM

## 2022-01-13 DIAGNOSIS — E669 Obesity, unspecified: Secondary | ICD-10-CM | POA: Diagnosis present

## 2022-01-13 HISTORY — PX: TOTAL KNEE ARTHROPLASTY: SHX125

## 2022-01-13 LAB — COMPREHENSIVE METABOLIC PANEL
ALT: 22 U/L (ref 0–44)
AST: 18 U/L (ref 15–41)
Albumin: 3.7 g/dL (ref 3.5–5.0)
Alkaline Phosphatase: 37 U/L — ABNORMAL LOW (ref 38–126)
Anion gap: 9 (ref 5–15)
BUN: 14 mg/dL (ref 6–20)
CO2: 25 mmol/L (ref 22–32)
Calcium: 9 mg/dL (ref 8.9–10.3)
Chloride: 103 mmol/L (ref 98–111)
Creatinine, Ser: 0.62 mg/dL (ref 0.44–1.00)
GFR, Estimated: >60 mL/min (ref >60)
Glucose, Bld: 127 mg/dL — ABNORMAL HIGH (ref 70–99)
Potassium: 3.8 mmol/L (ref 3.5–5.1)
Sodium: 137 mmol/L (ref 135–145)
Total Bilirubin: 0.5 mg/dL (ref 0.3–1.2)
Total Protein: 6.7 g/dL (ref 6.5–8.1)

## 2022-01-13 LAB — TYPE AND SCREEN
ABO/RH(D): B POS
Antibody Screen: NEGATIVE
Weak D: POSITIVE

## 2022-01-13 SURGERY — ARTHROPLASTY, KNEE, TOTAL
Anesthesia: Monitor Anesthesia Care | Site: Knee | Laterality: Left

## 2022-01-13 MED ORDER — FENTANYL CITRATE PF 50 MCG/ML IJ SOSY
50.0000 ug | PREFILLED_SYRINGE | INTRAMUSCULAR | Status: AC
  Administered 2022-01-13: 50 ug via INTRAVENOUS
  Filled 2022-01-13: qty 2

## 2022-01-13 MED ORDER — ALLOPURINOL 100 MG PO TABS
100.0000 mg | ORAL_TABLET | Freq: Every day | ORAL | Status: DC
  Administered 2022-01-14 – 2022-01-16 (×3): 100 mg via ORAL
  Filled 2022-01-13 (×3): qty 1

## 2022-01-13 MED ORDER — ACETAMINOPHEN 325 MG PO TABS
325.0000 mg | ORAL_TABLET | Freq: Four times a day (QID) | ORAL | Status: DC | PRN
  Administered 2022-01-14 – 2022-01-16 (×4): 650 mg via ORAL
  Filled 2022-01-13 (×4): qty 2

## 2022-01-13 MED ORDER — ORAL CARE MOUTH RINSE: 15.0000 mL | Freq: Once | OROMUCOSAL | Status: AC

## 2022-01-13 MED ORDER — TRANEXAMIC ACID-NACL 1000-0.7 MG/100ML-% IV SOLN
1000.0000 mg | INTRAVENOUS | Status: AC
  Administered 2022-01-13: 1000 mg via INTRAVENOUS
  Filled 2022-01-13: qty 100

## 2022-01-13 MED ORDER — PROPOFOL 500 MG/50ML IV EMUL
INTRAVENOUS | Status: DC | PRN
  Administered 2022-01-13: 100 ug/kg/min via INTRAVENOUS

## 2022-01-13 MED ORDER — MIDAZOLAM HCL 2 MG/2ML IJ SOLN
1.0000 mg | INTRAMUSCULAR | Status: AC
  Administered 2022-01-13: 1 mg via INTRAVENOUS
  Filled 2022-01-13: qty 2

## 2022-01-13 MED ORDER — BUPIVACAINE-EPINEPHRINE (PF) 0.5% -1:200000 IJ SOLN
INTRAMUSCULAR | Status: DC | PRN
  Administered 2022-01-13: 30 mL

## 2022-01-13 MED ORDER — FENTANYL CITRATE PF 50 MCG/ML IJ SOSY
25.0000 ug | PREFILLED_SYRINGE | INTRAMUSCULAR | Status: DC | PRN
  Administered 2022-01-13: 50 ug via INTRAVENOUS

## 2022-01-13 MED ORDER — FENTANYL CITRATE PF 50 MCG/ML IJ SOSY
PREFILLED_SYRINGE | INTRAMUSCULAR | Status: AC
  Administered 2022-01-13: 50 ug via INTRAVENOUS
  Filled 2022-01-13: qty 1

## 2022-01-13 MED ORDER — ONDANSETRON HCL 4 MG/2ML IJ SOLN
INTRAMUSCULAR | Status: AC
  Filled 2022-01-13: qty 2

## 2022-01-13 MED ORDER — METOCLOPRAMIDE HCL 5 MG/ML IJ SOLN: 5.0000 mg | Freq: Three times a day (TID) | INTRAMUSCULAR | Status: DC | PRN

## 2022-01-13 MED ORDER — METOCLOPRAMIDE HCL 5 MG PO TABS: 5.0000 mg | ORAL_TABLET | Freq: Three times a day (TID) | ORAL | Status: DC | PRN

## 2022-01-13 MED ORDER — FERROUS SULFATE 325 (65 FE) MG PO TABS
325.0000 mg | ORAL_TABLET | Freq: Every day | ORAL | Status: DC
  Administered 2022-01-14 – 2022-01-16 (×3): 325 mg via ORAL
  Filled 2022-01-13 (×3): qty 1

## 2022-01-13 MED ORDER — OXYCODONE HCL 5 MG PO TABS
5.0000 mg | ORAL_TABLET | ORAL | Status: DC | PRN
  Administered 2022-01-13 – 2022-01-16 (×10): 10 mg via ORAL
  Administered 2022-01-16: 5 mg via ORAL
  Administered 2022-01-16: 10 mg via ORAL
  Filled 2022-01-13 (×12): qty 2

## 2022-01-13 MED ORDER — KETOROLAC TROMETHAMINE 15 MG/ML IJ SOLN
15.0000 mg | Freq: Once | INTRAMUSCULAR | Status: AC
  Administered 2022-01-13: 15 mg via INTRAVENOUS
  Filled 2022-01-13: qty 1

## 2022-01-13 MED ORDER — SODIUM CHLORIDE 0.9 % IV SOLN: INTRAVENOUS | Status: DC

## 2022-01-13 MED ORDER — METHOCARBAMOL 500 MG IVPB - SIMPLE MED: 500.0000 mg | Freq: Four times a day (QID) | INTRAVENOUS | Status: DC | PRN

## 2022-01-13 MED ORDER — ONDANSETRON HCL 4 MG/2ML IJ SOLN: 4.0000 mg | Freq: Four times a day (QID) | INTRAMUSCULAR | Status: DC | PRN

## 2022-01-13 MED ORDER — HYDROMORPHONE HCL 2 MG PO TABS
2.0000 mg | ORAL_TABLET | ORAL | Status: DC | PRN
  Administered 2022-01-13 – 2022-01-15 (×2): 2 mg via ORAL
  Filled 2022-01-13 (×2): qty 1

## 2022-01-13 MED ORDER — POTASSIUM CHLORIDE CRYS ER 10 MEQ PO TBCR
10.0000 meq | EXTENDED_RELEASE_TABLET | Freq: Every day | ORAL | Status: DC
  Administered 2022-01-13 – 2022-01-16 (×4): 10 meq via ORAL
  Filled 2022-01-13 (×6): qty 1

## 2022-01-13 MED ORDER — ASPIRIN 81 MG PO CHEW
81.0000 mg | CHEWABLE_TABLET | Freq: Two times a day (BID) | ORAL | Status: DC
  Administered 2022-01-13 – 2022-01-16 (×6): 81 mg via ORAL
  Filled 2022-01-13 (×6): qty 1

## 2022-01-13 MED ORDER — OXYCODONE HCL 5 MG PO TABS
ORAL_TABLET | ORAL | Status: AC
  Filled 2022-01-13: qty 1

## 2022-01-13 MED ORDER — OLMESARTAN-AMLODIPINE-HCTZ 40-5-12.5 MG PO TABS: 1.0000 | ORAL_TABLET | Freq: Every day | ORAL | Status: DC

## 2022-01-13 MED ORDER — ACETAMINOPHEN 500 MG PO TABS
1000.0000 mg | ORAL_TABLET | Freq: Once | ORAL | Status: AC
  Administered 2022-01-13: 1000 mg via ORAL

## 2022-01-13 MED ORDER — BUPIVACAINE IN DEXTROSE 0.75-8.25 % IT SOLN
INTRATHECAL | Status: DC | PRN
  Administered 2022-01-13: 1.6 mL via INTRATHECAL

## 2022-01-13 MED ORDER — KETOROLAC TROMETHAMINE 15 MG/ML IJ SOLN
7.5000 mg | Freq: Four times a day (QID) | INTRAMUSCULAR | Status: AC
  Administered 2022-01-13 – 2022-01-14 (×3): 7.5 mg via INTRAVENOUS
  Filled 2022-01-13 (×3): qty 1

## 2022-01-13 MED ORDER — AMISULPRIDE (ANTIEMETIC) 5 MG/2ML IV SOLN: 10.0000 mg | Freq: Once | INTRAVENOUS | Status: DC | PRN

## 2022-01-13 MED ORDER — PANTOPRAZOLE SODIUM 40 MG PO TBEC
40.0000 mg | DELAYED_RELEASE_TABLET | Freq: Every day | ORAL | Status: DC
  Administered 2022-01-13 – 2022-01-16 (×4): 40 mg via ORAL
  Filled 2022-01-13 (×4): qty 1

## 2022-01-13 MED ORDER — DOCUSATE SODIUM 100 MG PO CAPS
100.0000 mg | ORAL_CAPSULE | Freq: Two times a day (BID) | ORAL | Status: DC
  Administered 2022-01-13 – 2022-01-16 (×6): 100 mg via ORAL
  Filled 2022-01-13 (×6): qty 1

## 2022-01-13 MED ORDER — 0.9 % SODIUM CHLORIDE (POUR BTL) OPTIME
TOPICAL | Status: DC | PRN
  Administered 2022-01-13: 1000 mL

## 2022-01-13 MED ORDER — MENTHOL 3 MG MT LOZG: 1.0000 | LOZENGE | OROMUCOSAL | Status: DC | PRN

## 2022-01-13 MED ORDER — ALUM & MAG HYDROXIDE-SIMETH 200-200-20 MG/5ML PO SUSP: 30.0000 mL | ORAL | Status: DC | PRN

## 2022-01-13 MED ORDER — DEXAMETHASONE SODIUM PHOSPHATE 10 MG/ML IJ SOLN
INTRAMUSCULAR | Status: AC
  Filled 2022-01-13: qty 1

## 2022-01-13 MED ORDER — CEFAZOLIN SODIUM-DEXTROSE 2-4 GM/100ML-% IV SOLN
2.0000 g | INTRAVENOUS | Status: AC
  Administered 2022-01-13: 2 g via INTRAVENOUS
  Filled 2022-01-13: qty 100

## 2022-01-13 MED ORDER — CLONIDINE HCL (ANALGESIA) 100 MCG/ML EP SOLN
EPIDURAL | Status: DC | PRN
  Administered 2022-01-13: 50 ug

## 2022-01-13 MED ORDER — FESOTERODINE FUMARATE ER 4 MG PO TB24
4.0000 mg | ORAL_TABLET | Freq: Every day | ORAL | Status: DC
  Administered 2022-01-14 – 2022-01-16 (×3): 4 mg via ORAL
  Filled 2022-01-13 (×3): qty 1

## 2022-01-13 MED ORDER — VITAMIN D 25 MCG (1000 UNIT) PO TABS
2000.0000 [IU] | ORAL_TABLET | Freq: Every day | ORAL | Status: DC
  Administered 2022-01-14 – 2022-01-16 (×3): 2000 [IU] via ORAL
  Filled 2022-01-13 (×5): qty 2

## 2022-01-13 MED ORDER — SODIUM CHLORIDE 0.9 % IR SOLN
Status: DC | PRN
  Administered 2022-01-13: 1000 mL

## 2022-01-13 MED ORDER — LACTATED RINGERS IV SOLN: INTRAVENOUS | Status: DC

## 2022-01-13 MED ORDER — CHLORHEXIDINE GLUCONATE 0.12 % MT SOLN
15.0000 mL | Freq: Once | OROMUCOSAL | Status: AC
  Administered 2022-01-13: 15 mL via OROMUCOSAL

## 2022-01-13 MED ORDER — ROPIVACAINE HCL 5 MG/ML IJ SOLN
INTRAMUSCULAR | Status: DC | PRN
  Administered 2022-01-13: 20 mL via PERINEURAL

## 2022-01-13 MED ORDER — PHENOL 1.4 % MT LIQD: 1.0000 | OROMUCOSAL | Status: DC | PRN

## 2022-01-13 MED ORDER — ONDANSETRON HCL 4 MG PO TABS: 4.0000 mg | ORAL_TABLET | Freq: Four times a day (QID) | ORAL | Status: DC | PRN

## 2022-01-13 MED ORDER — OXYCODONE HCL 5 MG PO TABS
ORAL_TABLET | ORAL | Status: AC
  Administered 2022-01-13: 10 mg
  Filled 2022-01-13: qty 1

## 2022-01-13 MED ORDER — DEXAMETHASONE SODIUM PHOSPHATE 10 MG/ML IJ SOLN
INTRAMUSCULAR | Status: DC | PRN
  Administered 2022-01-13: 10 mg via INTRAVENOUS

## 2022-01-13 MED ORDER — PROPOFOL 1000 MG/100ML IV EMUL
INTRAVENOUS | Status: AC
  Filled 2022-01-13: qty 100

## 2022-01-13 MED ORDER — HYDROMORPHONE HCL 1 MG/ML IJ SOLN
0.5000 mg | INTRAMUSCULAR | Status: DC | PRN
  Administered 2022-01-13 – 2022-01-14 (×3): 1 mg via INTRAVENOUS
  Filled 2022-01-13 (×3): qty 1

## 2022-01-13 MED ORDER — FENTANYL CITRATE PF 50 MCG/ML IJ SOSY
PREFILLED_SYRINGE | INTRAMUSCULAR | Status: AC
  Administered 2022-01-13: 50 ug via INTRAVENOUS
  Filled 2022-01-13: qty 2

## 2022-01-13 MED ORDER — EPHEDRINE 5 MG/ML INJ
INTRAVENOUS | Status: AC
  Filled 2022-01-13: qty 5

## 2022-01-13 MED ORDER — IRBESARTAN 150 MG PO TABS
300.0000 mg | ORAL_TABLET | Freq: Every day | ORAL | Status: DC
  Administered 2022-01-14 – 2022-01-15 (×2): 300 mg via ORAL
  Filled 2022-01-13 (×2): qty 2

## 2022-01-13 MED ORDER — POLYETHYLENE GLYCOL 3350 17 G PO PACK
17.0000 g | PACK | Freq: Every day | ORAL | Status: DC | PRN
  Administered 2022-01-15: 17 g via ORAL
  Filled 2022-01-13: qty 1

## 2022-01-13 MED ORDER — EPHEDRINE SULFATE-NACL 50-0.9 MG/10ML-% IV SOSY
PREFILLED_SYRINGE | INTRAVENOUS | Status: DC | PRN
  Administered 2022-01-13 (×2): 5 mg via INTRAVENOUS
  Administered 2022-01-13: 10 mg via INTRAVENOUS
  Administered 2022-01-13: 5 mg via INTRAVENOUS

## 2022-01-13 MED ORDER — PROPOFOL 10 MG/ML IV BOLUS
INTRAVENOUS | Status: DC | PRN
  Administered 2022-01-13 (×5): 20 mg via INTRAVENOUS

## 2022-01-13 MED ORDER — HYDROCHLOROTHIAZIDE 12.5 MG PO TABS
12.5000 mg | ORAL_TABLET | Freq: Every day | ORAL | Status: DC
  Administered 2022-01-14 – 2022-01-15 (×2): 12.5 mg via ORAL
  Filled 2022-01-13 (×2): qty 1

## 2022-01-13 MED ORDER — METHOCARBAMOL 500 MG IVPB - SIMPLE MED
INTRAVENOUS | Status: AC
  Administered 2022-01-13: 500 mg via INTRAVENOUS
  Filled 2022-01-13: qty 55

## 2022-01-13 MED ORDER — BUPIVACAINE-EPINEPHRINE (PF) 0.5% -1:200000 IJ SOLN
INTRAMUSCULAR | Status: AC
  Filled 2022-01-13: qty 30

## 2022-01-13 MED ORDER — AMLODIPINE BESYLATE 5 MG PO TABS
5.0000 mg | ORAL_TABLET | Freq: Every day | ORAL | Status: DC
  Administered 2022-01-14 – 2022-01-15 (×2): 5 mg via ORAL
  Filled 2022-01-13 (×2): qty 1

## 2022-01-13 MED ORDER — METHOCARBAMOL 500 MG PO TABS
500.0000 mg | ORAL_TABLET | Freq: Four times a day (QID) | ORAL | Status: DC | PRN
  Administered 2022-01-14 – 2022-01-16 (×8): 500 mg via ORAL
  Filled 2022-01-13 (×8): qty 1

## 2022-01-13 MED ORDER — CEFAZOLIN SODIUM-DEXTROSE 1-4 GM/50ML-% IV SOLN
1.0000 g | Freq: Four times a day (QID) | INTRAVENOUS | Status: AC
  Administered 2022-01-13 (×2): 1 g via INTRAVENOUS
  Filled 2022-01-13 (×2): qty 50

## 2022-01-13 MED ORDER — ONDANSETRON HCL 4 MG/2ML IJ SOLN
INTRAMUSCULAR | Status: DC | PRN
  Administered 2022-01-13: 4 mg via INTRAVENOUS

## 2022-01-13 MED ORDER — POVIDONE-IODINE 10 % EX SWAB
2.0000 | Freq: Once | CUTANEOUS | Status: AC
  Administered 2022-01-13: 2 via TOPICAL

## 2022-01-13 MED ORDER — DIPHENHYDRAMINE HCL 12.5 MG/5ML PO ELIX: 12.5000 mg | ORAL_SOLUTION | ORAL | Status: DC | PRN

## 2022-01-13 MED ORDER — PROPOFOL 500 MG/50ML IV EMUL
INTRAVENOUS | Status: AC
  Filled 2022-01-13: qty 50

## 2022-01-13 SURGICAL SUPPLY — 58 items
BAG COUNTER SPONGE SURGICOUNT (BAG) IMPLANT
BAG ZIPLOCK 12X15 (MISCELLANEOUS) ×1 IMPLANT
BENZOIN TINCTURE PRP APPL 2/3 (GAUZE/BANDAGES/DRESSINGS) IMPLANT
BLADE SAG 18X100X1.27 (BLADE) ×1 IMPLANT
BLADE SURG SZ10 CARB STEEL (BLADE) ×2 IMPLANT
BNDG ELASTIC 6X5.8 VLCR STR LF (GAUZE/BANDAGES/DRESSINGS) ×2 IMPLANT
BOWL SMART MIX CTS (DISPOSABLE) IMPLANT
CEMENT BONE R 1X40 (Cement) IMPLANT
CEMENT BONE SIMPLEX SPEEDSET (Cement) IMPLANT
COOLER ICEMAN CLASSIC (MISCELLANEOUS) ×1 IMPLANT
COVER SURGICAL LIGHT HANDLE (MISCELLANEOUS) ×1 IMPLANT
CUFF TOURN SGL QUICK 34 (TOURNIQUET CUFF) ×1
CUFF TRNQT CYL 34X4.125X (TOURNIQUET CUFF) ×1 IMPLANT
DRAPE INCISE IOBAN 66X45 STRL (DRAPES) ×1 IMPLANT
DRAPE U-SHAPE 47X51 STRL (DRAPES) ×1 IMPLANT
DURAPREP 26ML APPLICATOR (WOUND CARE) ×1 IMPLANT
ELECT BLADE TIP CTD 4 INCH (ELECTRODE) ×1 IMPLANT
ELECT REM PT RETURN 15FT ADLT (MISCELLANEOUS) ×1 IMPLANT
FEMORAL KNEE COMP SZ 8 STND LT (Knees) ×1 IMPLANT
FEMORAL KNEE COMP SZ 8STD LT (Knees) IMPLANT
GAUZE PAD ABD 7.5X8 STRL (GAUZE/BANDAGES/DRESSINGS) IMPLANT
GAUZE PAD ABD 8X10 STRL (GAUZE/BANDAGES/DRESSINGS) ×2 IMPLANT
GAUZE SPONGE 4X4 12PLY STRL (GAUZE/BANDAGES/DRESSINGS) ×1 IMPLANT
GAUZE XEROFORM 1X8 LF (GAUZE/BANDAGES/DRESSINGS) IMPLANT
GLOVE BIO SURGEON STRL SZ7.5 (GLOVE) ×1 IMPLANT
GLOVE BIOGEL PI IND STRL 8 (GLOVE) ×2 IMPLANT
GLOVE ECLIPSE 8.0 STRL XLNG CF (GLOVE) ×1 IMPLANT
GOWN STRL REUS W/ TWL XL LVL3 (GOWN DISPOSABLE) ×2 IMPLANT
GOWN STRL REUS W/TWL XL LVL3 (GOWN DISPOSABLE) ×2
HANDPIECE INTERPULSE COAX TIP (DISPOSABLE) ×1
HOLDER FOLEY CATH W/STRAP (MISCELLANEOUS) IMPLANT
IMMOBILIZER KNEE 20 (SOFTGOODS) ×1
IMMOBILIZER KNEE 20 THIGH 36 (SOFTGOODS) ×1 IMPLANT
INSERT ASF POLY 14 LT (Insert) IMPLANT
KIT TURNOVER KIT A (KITS) IMPLANT
NS IRRIG 1000ML POUR BTL (IV SOLUTION) ×1 IMPLANT
PACK TOTAL KNEE CUSTOM (KITS) ×1 IMPLANT
PAD COLD SHLDR WRAP-ON (PAD) ×1 IMPLANT
PADDING CAST COTTON 6X4 STRL (CAST SUPPLIES) ×2 IMPLANT
PROTECTOR NERVE ULNAR (MISCELLANEOUS) ×1 IMPLANT
SCREW FEMALE HEX FIX 25X2.5 (ORTHOPEDIC DISPOSABLE SUPPLIES) IMPLANT
SCREW HEADED 33MM KNEE (MISCELLANEOUS) IMPLANT
SET HNDPC FAN SPRY TIP SCT (DISPOSABLE) ×1 IMPLANT
SET PAD KNEE POSITIONER (MISCELLANEOUS) ×1 IMPLANT
SPIKE FLUID TRANSFER (MISCELLANEOUS) IMPLANT
STAPLER VISISTAT 35W (STAPLE) IMPLANT
STEM POLY PAT PLY 32M KNEE (Knees) IMPLANT
STEM TIBIA 5 DEG SZ E L KNEE (Knees) IMPLANT
STRIP CLOSURE SKIN 1/2X4 (GAUZE/BANDAGES/DRESSINGS) IMPLANT
SUT MNCRL AB 4-0 PS2 18 (SUTURE) IMPLANT
SUT VIC AB 0 CT1 27 (SUTURE) ×1
SUT VIC AB 0 CT1 27XBRD ANTBC (SUTURE) ×1 IMPLANT
SUT VIC AB 1 CT1 36 (SUTURE) ×2 IMPLANT
SUT VIC AB 2-0 CT1 27 (SUTURE) ×2
SUT VIC AB 2-0 CT1 TAPERPNT 27 (SUTURE) ×2 IMPLANT
TIBIA STEM 5 DEG SZ E L KNEE (Knees) ×1 IMPLANT
TRAY FOLEY MTR SLVR 16FR STAT (SET/KITS/TRAYS/PACK) IMPLANT
WATER STERILE IRR 1000ML POUR (IV SOLUTION) ×2 IMPLANT

## 2022-01-13 NOTE — Anesthesia Procedure Notes (Addendum)
Anesthesia Regional Block: Adductor canal block   Pre-Anesthetic Checklist: , timeout performed,  Correct Patient, Correct Site, Correct Laterality,  Correct Procedure, Correct Position, site marked,  Risks and benefits discussed,  Surgical consent,  Pre-op evaluation,  At surgeon's request and post-op pain management  Laterality: Left  Prep: chloraprep       Needles:  Injection technique: Single-shot  Needle Type: Echogenic Needle     Needle Length: 9cm  Needle Gauge: 21     Additional Needles:   Procedures:,,,, ultrasound used (permanent image in chart),,    Narrative:  Start time: 01/13/2022 9:06 AM End time: 01/13/2022 9:11 AM Injection made incrementally with aspirations every 5 mL.  Performed by: Personally  Anesthesiologist: Suzette Battiest, MD

## 2022-01-13 NOTE — Anesthesia Procedure Notes (Signed)
Spinal  Patient location during procedure: OR End time: 01/13/2022 10:09 AM Reason for block: surgical anesthesia Staffing Performed: resident/CRNA  Resident/CRNA: Noralyn Pick D, CRNA Performed by: Jamear Carbonneau, Clinical cytogeneticist D, CRNA Authorized by: Suzette Battiest, MD   Preanesthetic Checklist Completed: patient identified, IV checked, site marked, risks and benefits discussed, surgical consent, monitors and equipment checked, pre-op evaluation and timeout performed Spinal Block Patient position: sitting Prep: DuraPrep Patient monitoring: heart rate, continuous pulse ox and blood pressure Approach: midline Location: L3-4 Injection technique: single-shot Needle Needle type: Pencan  Needle gauge: 24 G Needle length: 9 cm Assessment Sensory level: T6 Events: CSF return

## 2022-01-13 NOTE — Anesthesia Postprocedure Evaluation (Signed)
Anesthesia Post Note  Patient: Kayla Larsen  Procedure(s) Performed: LEFT TOTAL KNEE ARTHROPLASTY (Left: Knee)     Patient location during evaluation: PACU Anesthesia Type: Spinal Level of consciousness: awake and alert Pain management: pain level controlled Vital Signs Assessment: post-procedure vital signs reviewed and stable Respiratory status: spontaneous breathing and respiratory function stable Cardiovascular status: blood pressure returned to baseline and stable Postop Assessment: spinal receding Anesthetic complications: no  No notable events documented.  Last Vitals:  Vitals:   01/13/22 1330 01/13/22 1400  BP: 117/80 119/70  Pulse: 79 88  Resp: 17 (!) 22  Temp:    SpO2: 100% 100%    Last Pain:  Vitals:   01/13/22 1400  TempSrc:   PainSc: 6                  Tiajuana Amass

## 2022-01-13 NOTE — Anesthesia Preprocedure Evaluation (Signed)
Anesthesia Evaluation  Patient identified by MRN, date of birth, ID band Patient awake    Reviewed: Allergy & Precautions, NPO status , Patient's Chart, lab work & pertinent test results  Airway Mallampati: III  TM Distance: >3 FB Neck ROM: Full    Dental   Pulmonary former smoker   breath sounds clear to auscultation       Cardiovascular hypertension, Pt. on medications  Rhythm:Regular Rate:Normal     Neuro/Psych negative neurological ROS     GI/Hepatic negative GI ROS, Neg liver ROS,,,  Endo/Other    Morbid obesity  Renal/GU negative Renal ROS     Musculoskeletal  (+) Arthritis ,    Abdominal   Peds  Hematology  (+) Blood dyscrasia, anemia   Anesthesia Other Findings   Reproductive/Obstetrics                              Lab Results  Component Value Date   WBC 6.9 12/29/2021   HGB 11.5 12/29/2021   HCT 36.4 12/29/2021   MCV 72 (L) 12/29/2021   PLT 381 12/29/2021   Lab Results  Component Value Date   CREATININE 0.67 12/29/2021   BUN 15 12/29/2021   NA 142 12/29/2021   K 3.9 12/29/2021   CL 103 12/29/2021   CO2 25 12/29/2021    Anesthesia Physical Anesthesia Plan  ASA: 3  Anesthesia Plan: Spinal and MAC   Post-op Pain Management: Regional block* and Tylenol PO (pre-op)*   Induction:   PONV Risk Score and Plan: 2 and Dexamethasone, Ondansetron, Propofol infusion and Treatment may vary due to age or medical condition  Airway Management Planned: Natural Airway and Simple Face Mask  Additional Equipment:   Intra-op Plan:   Post-operative Plan:   Informed Consent: I have reviewed the patients History and Physical, chart, labs and discussed the procedure including the risks, benefits and alternatives for the proposed anesthesia with the patient or authorized representative who has indicated his/her understanding and acceptance.       Plan Discussed with:  CRNA  Anesthesia Plan Comments:          Anesthesia Quick Evaluation

## 2022-01-13 NOTE — Op Note (Signed)
Operative Note  Date of operation: 01/13/2022 Preoperative diagnosis: Primary osteoarthritis left knee Postoperative diagnosis: Same  Procedures: Left cemented total knee arthroplasty  Implants: Biomet/Zimmer persona cemented knee system with size 8 left standard CR femur, size E left tibial tray, 14 mm medial congruent left polythene insert, 32 mm patella button  Surgeon: Lind Guest. Ninfa Linden, MD Assistant: Benita Stabile, PA-C  Anesthesia: #1 left lower extremity regional block, #2 spinal, #3 local EBL: Less than 100 cc Antibiotics: 2 g IV Ancef Tourniquet time less than 1 hour Complications: None  Indications: The patient is a 52 year old female well-known to me.  She has unfortunately debilitating arthritis of both her knees and underwent a successful right total knee arthroplasty last year.  Given the severe arthritis of her left knee combined with the daily pain she is having with that knee as well as her arthritis slowing down her mobility, her actives day living her quality of life, she was to proceed with a total knee arthroplasty on left side.  She has been very happy with her right knee replacement and what that is done for her.  Having had this done before she is fully aware of the risk of acute blood loss anemia, nerve vessel injury, fracture, infection, DVT, implant failure and wound healing issues.  She understands her goals are hopefully decrease pain, improve mobility, and improve quality of life.  Procedure description: After informed consent was obtained and the appropriate left knee was marked, anesthesia obtained a left lower extremity adductor canal block in the holding room.  The patient was then brought to the operating room and set up on the operating table where spinal anesthesia was obtained.  She was then laid in supine position on the operating table and a Foley catheter was placed.  A nonsterile tourniquet is placed around her upper left thigh and her left thigh,  knee, leg, ankle and foot were prepped and draped in DuraPrep and sterile drapes including a sterile stockinette.  A timeout was called and she identified as the correct patient the correct left knee.  An Esmarch was used to wrap out the leg and the tourniquet was inflated to 300 mm of pressure.  With the knee extended we then made a midline incision over the patella and carried this proximally distally.  Dissection was carried down to the knee joint and a medial parapatellar arthrotomy was made.  A large joint effusion was encountered as well as loose bodies in the knee.  With the knee in a flexed position we could see there was complete loss of cartilage throughout the knee.  With the removed osteophytes there were large from all 3 compartments as well as remnants of the ACL as well as medial lateral meniscus.  We then used an extramedullary cutting guide for making her proximal tibia cut correction varus and valgus at 3 degree slope.  We made this cut to take 2 mm off the low side and backed this down to more millimeters after first cut.  We then went to the femur and used a intramedullary cutting guide for our distal femur cut setting this for left knee at 5 degrees externally rotated and a 10 mm distal femoral cut.  We made the cut without difficulty and the knee actually hyperextended with a 10 mm extension block.  She was fully extended with a 12 mm block.  We then went back to the femur and put a femoral sizing guide based off the epicondylar axis.  Based off  of this we chose a size 8 femur.  We put a 4-in-1 cutting block for size 8 femur and made her anterior posterior cuts followed by her chamfer cuts.  We then backed the tibia and chose a size E left tibial tray for coverage over the tibial plateau setting the rotation of the tibial tubercle and the femur.  We made our drill hole and keel punch off of this easily.  We then trialed our size E left tibia combined with our size 8 right standard CR femur.  We  went up to 14 mm medial congruent left polythene insert we are pleased with range of motion and stability that answered.  We then made a patella cut and drilled 3 holes for size 32 patella button.  With all transportation they would put her through several cycles of motion we are pleased with range of motion and stability with the trial implants.  All trial implants were removed and the knee was irrigated normal saline solution.  We then placed our half percent Marcaine with epinephrine around the arthrotomy.  The cement was then mixed in with the knee in a flexed position we cemented our Biomet Zimmer persona tibial tray for left knee size E followed by cementing our size 8 left CR standard femur.  We placed our 14 mm medial congruent left polythene insert and cemented our size 32 patella button.  We then held the knee fully extended while the cement hardened.  Once it hardened the tourniquet was let down hemostasis was obtained with electrocautery.  The arthrotomy was then closed with interrupted #1 Vicryl suture followed by 0 Vicryl close the deep tissue and 2-0 Vicryl close subcutaneous tissue.  The skin was closed with staples.  Well-padded sterile dressings applied.  She was taken recovery in stable addition with all final counts being correct and no complications noted.  Benita Stabile, PA-C did assist in the entire case and beginning to end and his assistance was crucial medically necessary for soft tissue management and retraction, helping guide implant placement and a layered closure of the wound.

## 2022-01-13 NOTE — Evaluation (Signed)
Physical Therapy Evaluation Patient Details Name: Kayla Larsen MRN: [id number] DOB: [birth year] Today's Date: 01/13/2022  History of Present Illness  Pt is a 52yo female presenting s/p L-TKA on 01/13/22. PMH: R-TKA 01/14/21, HTN.  `  Clinical Impression  Kayla Larsen is a 52 y.o. female POD 0 s/p L-TKA. Patient reports independence with mobility at baseline. Patient is now limited by functional impairments (see PT problem list below) and requires min guard for bed mobility and min assist for transfers. Patient was able to ambulate 18 feet with RW and min guard level of assist. Patient instructed in exercise to facilitate ROM and circulation to manage edema. Provided incentive spirometer and with Vcs pt able to achieve 179m. Patient will benefit from continued skilled PT interventions to address impairments and progress towards PLOF. OF NOTE: pt has 14 steps with bilateral railings to enter her apartment. Acute PT will follow to progress mobility and stair training in preparation for safe discharge home.       Recommendations for follow up therapy are one component of a multi-disciplinary discharge planning process, led by the attending physician.  Recommendations may be updated based on patient status, additional functional criteria and insurance authorization.  Follow Up Recommendations Follow physician's recommendations for discharge plan and follow up therapies      Assistance Recommended at Discharge Frequent or constant Supervision/Assistance  Patient can return home with the following  A little help with walking and/or transfers;A little help with bathing/dressing/bathroom;Assistance with cooking/housework;Assist for transportation;Help with stairs or ramp for entrance    Equipment Recommendations None recommended by PT (Pt has recommended DME)  Recommendations for Other Services       Functional Status Assessment Patient has had a recent decline in their functional status and  demonstrates the ability to make significant improvements in function in a reasonable and predictable amount of time.     Precautions / Restrictions Precautions Precautions: Fall Restrictions Weight Bearing Restrictions: No Other Position/Activity Restrictions: wbat      Mobility  Bed Mobility Overal bed mobility: Needs Assistance Bed Mobility: Supine to Sit     Supine to sit: Supervision     General bed mobility comments: For safety only, no physical assist required. pt utilized gait belt to self-assist LLE off bed    Transfers Overall transfer level: Needs assistance Equipment used: Rolling walker (2 wheels) Transfers: Sit to/from Stand Sit to Stand: Min assist           General transfer comment: Min assist for light lift assist.    Ambulation/Gait Ambulation/Gait assistance: Min guard Gait Distance (Feet): 18 Feet Assistive device: Rolling walker (2 wheels) Gait Pattern/deviations: Step-to pattern, Decreased stance time - right, Decreased step length - right Gait velocity: decreased     General Gait Details: Pt ambulated with RW and min guard, no physical assist required or overt LOB noted.  Stairs            Wheelchair Mobility    Modified Rankin (Stroke Patients Only)       Balance Overall balance assessment: Needs assistance Sitting-balance support: Feet supported, No upper extremity supported Sitting balance-Leahy Scale: Good     Standing balance support: Reliant on assistive device for balance, During functional activity, Bilateral upper extremity supported Standing balance-Leahy Scale: Poor                               Pertinent Vitals/Pain Pain Assessment Pain Assessment: 0-10 Pain  Score: 5  Pain Location: left knee Pain Descriptors / Indicators: Operative site guarding Pain Intervention(s): Limited activity within patient's tolerance, Monitored during session, Repositioned, Ice applied    Home Living  Family/patient expects to be discharged to:: Private residence Living Arrangements: Children Available Help at Discharge: Family;Available 24 hours/day Type of Home: Apartment Home Access: Stairs to enter Entrance Stairs-Rails: Right;Left Entrance Stairs-Number of Steps: 14   Home Layout: One level Home Equipment: Conservation officer, nature (2 wheels);Cane - single point;Shower seat;Crutches;Toilet riser      Prior Function Prior Level of Function : Independent/Modified Independent;Working/employed;Driving             Mobility Comments: IND ADLs Comments: IND     Hand Dominance   Dominant Hand: Right    Extremity/Trunk Assessment   Upper Extremity Assessment Upper Extremity Assessment: Overall WFL for tasks assessed    Lower Extremity Assessment Lower Extremity Assessment: RLE deficits/detail;LLE deficits/detail RLE Deficits / Details: MMT ank DF/PF 5/5 RLE Sensation: WNL LLE Deficits / Details: MMT ank DF/PF 5/5, no extensor lag noted LLE Sensation: WNL    Cervical / Trunk Assessment Cervical / Trunk Assessment: Kyphotic  Communication   Communication: No difficulties  Cognition Arousal/Alertness: Awake/alert Behavior During Therapy: WFL for tasks assessed/performed Overall Cognitive Status: Within Functional Limits for tasks assessed                                          General Comments General comments (skin integrity, edema, etc.): Daughter Kayla Larsen present    Exercises Total Joint Exercises Ankle Circles/Pumps: AROM, Both, 20 reps   Assessment/Plan    PT Assessment Patient needs continued PT services  PT Problem List Decreased strength;Decreased range of motion;Decreased activity tolerance;Decreased balance;Decreased mobility;Decreased coordination;Pain       PT Treatment Interventions DME instruction;Gait training;Stair training;Functional mobility training;Therapeutic activities;Therapeutic exercise;Balance training;Neuromuscular  re-education;Patient/family education    PT Goals (Current goals can be found in the Care Plan section)  Acute Rehab PT Goals Patient Stated Goal: Walk without pain PT Goal Formulation: With patient Time For Goal Achievement: 01/20/22 Potential to Achieve Goals: Good    Frequency 7X/week     Co-evaluation               AM-PAC PT "6 Clicks" Mobility  Outcome Measure Help needed turning from your back to your side while in a flat bed without using bedrails?: None Help needed moving from lying on your back to sitting on the side of a flat bed without using bedrails?: A Little Help needed moving to and from a bed to a chair (including a wheelchair)?: A Little Help needed standing up from a chair using your arms (e.g., wheelchair or bedside chair)?: A Little Help needed to walk in hospital room?: A Little Help needed climbing 3-5 steps with a railing? : A Little 6 Click Score: 19    End of Session Equipment Utilized During Treatment: Gait belt Activity Tolerance: Patient tolerated treatment well;No increased pain Patient left: in chair;with call bell/phone within reach;with chair alarm set;with family/visitor present;with SCD's reapplied Nurse Communication: Mobility status PT Visit Diagnosis: Pain;Other abnormalities of gait and mobility (R26.89) Pain - Right/Left: Left Pain - part of body: Knee    Time: 1610-9604 PT Time Calculation (min) (ACUTE ONLY): 24 min   Charges:   PT Evaluation $PT Eval Low Complexity: 1 Low PT Treatments $Gait Training: 8-22 mins  Coolidge Breeze, PT, DPT [facility] Office: [phone] Weekend pager: [phone]  Coolidge Breeze 01/13/2022, 5:27 PM

## 2022-01-13 NOTE — Transfer of Care (Signed)
Immediate Anesthesia Transfer of Care Note  Patient: Kayla Larsen  Procedure(s) Performed: LEFT TOTAL KNEE ARTHROPLASTY (Left: Knee)  Patient Location: PACU  Anesthesia Type:Regional and Spinal  Level of Consciousness: awake, alert , and oriented  Airway & Oxygen Therapy: Patient Spontanous Breathing and Patient connected to face mask oxygen  Post-op Assessment: Report given to RN and Post -op Vital signs reviewed and stable  Post vital signs: Reviewed and stable  Last Vitals:  Vitals Value Taken Time  BP 106/68 01/13/22 1202  Temp    Pulse 82 01/13/22 1203  Resp 16 01/13/22 1203  SpO2 100 % 01/13/22 1203  Vitals shown include unvalidated device data.  Last Pain:  Vitals:   01/13/22 0910  TempSrc:   PainSc: 0-No pain      Patients Stated Pain Goal: 3 (56/25/63 8937)  Complications: No notable events documented.

## 2022-01-13 NOTE — Plan of Care (Signed)

## 2022-01-14 DIAGNOSIS — Z7982 Long term (current) use of aspirin: Secondary | ICD-10-CM | POA: Diagnosis not present

## 2022-01-14 DIAGNOSIS — I10 Essential (primary) hypertension: Secondary | ICD-10-CM | POA: Diagnosis present

## 2022-01-14 DIAGNOSIS — E781 Pure hyperglyceridemia: Secondary | ICD-10-CM | POA: Diagnosis present

## 2022-01-14 DIAGNOSIS — Z7985 Long-term (current) use of injectable non-insulin antidiabetic drugs: Secondary | ICD-10-CM | POA: Diagnosis not present

## 2022-01-14 DIAGNOSIS — Z808 Family history of malignant neoplasm of other organs or systems: Secondary | ICD-10-CM | POA: Diagnosis not present

## 2022-01-14 DIAGNOSIS — M109 Gout, unspecified: Secondary | ICD-10-CM | POA: Diagnosis present

## 2022-01-14 DIAGNOSIS — Z8249 Family history of ischemic heart disease and other diseases of the circulatory system: Secondary | ICD-10-CM | POA: Diagnosis not present

## 2022-01-14 DIAGNOSIS — Z96651 Presence of right artificial knee joint: Secondary | ICD-10-CM | POA: Diagnosis present

## 2022-01-14 DIAGNOSIS — Z79899 Other long term (current) drug therapy: Secondary | ICD-10-CM | POA: Diagnosis not present

## 2022-01-14 DIAGNOSIS — Z888 Allergy status to other drugs, medicaments and biological substances status: Secondary | ICD-10-CM | POA: Diagnosis not present

## 2022-01-14 DIAGNOSIS — Z6841 Body Mass Index (BMI) 40.0 and over, adult: Secondary | ICD-10-CM | POA: Diagnosis not present

## 2022-01-14 DIAGNOSIS — M1712 Unilateral primary osteoarthritis, left knee: Secondary | ICD-10-CM | POA: Diagnosis present

## 2022-01-14 DIAGNOSIS — Z833 Family history of diabetes mellitus: Secondary | ICD-10-CM | POA: Diagnosis not present

## 2022-01-14 DIAGNOSIS — Z841 Family history of disorders of kidney and ureter: Secondary | ICD-10-CM | POA: Diagnosis not present

## 2022-01-14 DIAGNOSIS — Z791 Long term (current) use of non-steroidal anti-inflammatories (NSAID): Secondary | ICD-10-CM | POA: Diagnosis not present

## 2022-01-14 DIAGNOSIS — E669 Obesity, unspecified: Secondary | ICD-10-CM | POA: Diagnosis present

## 2022-01-14 DIAGNOSIS — Z803 Family history of malignant neoplasm of breast: Secondary | ICD-10-CM | POA: Diagnosis not present

## 2022-01-14 DIAGNOSIS — Z801 Family history of malignant neoplasm of trachea, bronchus and lung: Secondary | ICD-10-CM | POA: Diagnosis not present

## 2022-01-14 DIAGNOSIS — Z87891 Personal history of nicotine dependence: Secondary | ICD-10-CM | POA: Diagnosis not present

## 2022-01-14 LAB — CBC
HCT: 30.3 % — ABNORMAL LOW (ref 36.0–46.0)
Hemoglobin: 9.4 g/dL — ABNORMAL LOW (ref 12.0–15.0)
MCH: 23.3 pg — ABNORMAL LOW (ref 26.0–34.0)
MCHC: 31 g/dL (ref 30.0–36.0)
MCV: 75 fL — ABNORMAL LOW (ref 80.0–100.0)
Platelets: 308 10*3/uL (ref 150–400)
RBC: 4.04 MIL/uL (ref 3.87–5.11)
RDW: 15.3 % (ref 11.5–15.5)
WBC: 12.6 10*3/uL — ABNORMAL HIGH (ref 4.0–10.5)
nRBC: 0 % (ref 0.0–0.2)

## 2022-01-14 LAB — BASIC METABOLIC PANEL
Anion gap: 7 (ref 5–15)
BUN: 18 mg/dL (ref 6–20)
CO2: 26 mmol/L (ref 22–32)
Calcium: 9.3 mg/dL (ref 8.9–10.3)
Chloride: 105 mmol/L (ref 98–111)
Creatinine, Ser: 0.61 mg/dL (ref 0.44–1.00)
GFR, Estimated: >60 mL/min (ref >60)
Glucose, Bld: 139 mg/dL — ABNORMAL HIGH (ref 70–99)
Potassium: 4.4 mmol/L (ref 3.5–5.1)
Sodium: 138 mmol/L (ref 135–145)

## 2022-01-14 MED ORDER — OXYCODONE HCL 5 MG PO TABS: 5.0000 mg | ORAL_TABLET | ORAL | 0 refills | Status: DC | PRN

## 2022-01-14 MED ORDER — TIZANIDINE HCL 4 MG PO TABS: 4.0000 mg | ORAL_TABLET | Freq: Four times a day (QID) | ORAL | 0 refills | Status: DC | PRN

## 2022-01-14 MED ORDER — ASPIRIN 81 MG PO CHEW: 81.0000 mg | CHEWABLE_TABLET | Freq: Two times a day (BID) | ORAL | 0 refills | Status: DC

## 2022-01-14 NOTE — Progress Notes (Signed)
Subjective: 1 Day Post-Op Procedure(s) (LRB): LEFT TOTAL KNEE ARTHROPLASTY (Left) Patient reports pain as moderate.    Objective: Vital signs in last 24 hours: Temp:  [97.8 F (36.6 C)-98.9 F (37.2 C)] 98.9 F (37.2 C) (01/06 1005) Pulse Rate:  [66-93] 91 (01/06 1005) Resp:  [16-22] 18 (01/06 1005) BP: (106-146)/(63-80) 126/66 (01/06 1005) SpO2:  [96 %-100 %] 100 % (01/06 1005)  Intake/Output from previous day: 01/05 0701 - 01/06 0700 In: 2751.2 [P.O.:720; I.V.:1926.2; IV Piggyback:105] Out: 1875 [Urine:1850; Blood:25] Intake/Output this shift: Total I/O In: -  Out: 100 [Urine:100]  Recent Labs    01/14/22 0339  HGB 9.4*   Recent Labs    01/14/22 0339  WBC 12.6*  RBC 4.04  HCT 30.3*  PLT 308   Recent Labs    01/13/22 1235 01/14/22 0339  NA 137 138  K 3.8 4.4  CL 103 105  CO2 25 26  BUN 14 18  CREATININE 0.62 0.61  GLUCOSE 127* 139*  CALCIUM 9.0 9.3   No results for input(s): "LABPT", "INR" in the last 72 hours.  Sensation intact distally Intact pulses distally Dorsiflexion/Plantar flexion intact Incision: dressing C/D/I Compartment soft   Assessment/Plan: 1 Day Post-Op Procedure(s) (LRB): LEFT TOTAL KNEE ARTHROPLASTY (Left) Up with therapy Plan for discharge tomorrow Discharge home with home health      [name] 01/14/2022, 1:23 PM

## 2022-01-14 NOTE — Plan of Care (Signed)

## 2022-01-14 NOTE — Progress Notes (Signed)
Physical Therapy Treatment Patient Details Name: Kayla Larsen MRN: [id number] DOB: 1970-07-04 Today's Date: 01/14/2022   History of Present Illness Pt is a 52yo female presenting s/p L-TKA on 01/13/22. PMH: R-TKA 01/14/21, HTN.  `    PT Comments    Pt progressing however became diaphoretic after amb ~ 60', needing seated rest. Will continue efforts to mobilize and progress pt as tolerated.   Recommendations for follow up therapy are one component of a multi-disciplinary discharge planning process, led by the attending physician.  Recommendations may be updated based on patient status, additional functional criteria and insurance authorization.  Follow Up Recommendations  Follow physician's recommendations for discharge plan and follow up therapies     Assistance Recommended at Discharge Intermittent Supervision/Assistance  Patient can return home with the following A little help with walking and/or transfers;A little help with bathing/dressing/bathroom;Assistance with cooking/housework;Assist for transportation;Help with stairs or ramp for entrance   Equipment Recommendations  None recommended by PT    Recommendations for Other Services       Precautions / Restrictions Precautions Precautions: Knee;Fall Precaution Comments: able to perform SLR without assist Required Braces or Orthoses: Knee Immobilizer - Left Knee Immobilizer - Left: Discontinue once straight leg raise with < 10 degree lag Restrictions Weight Bearing Restrictions: No Other Position/Activity Restrictions: wbat     Mobility  Bed Mobility               General bed mobility comments: in recliner on arrival and returned to same    Transfers Overall transfer level: Needs assistance Equipment used: Rolling walker (2 wheels) Transfers: Sit to/from Stand Sit to Stand: Min guard           General transfer comment: cues for hand placement    Ambulation/Gait Ambulation/Gait assistance: Min  guard Gait Distance (Feet): 60 Feet Assistive device: Rolling walker (2 wheels) Gait Pattern/deviations: Step-to pattern, Decreased stance time - left       General Gait Details: cues for sequence and RW position from self. working on L knee flexion during swing and heel strike on L. pt became mildly diaphoretic, recliner to pt for seated rest, improved with reclined position. (symptoms possibly d/t IV dilaudid prior to amb)   Chief Strategy Officer    Modified Rankin (Stroke Patients Only)       Balance     Sitting balance-Leahy Scale: Good       Standing balance-Leahy Scale: Poor                              Cognition Arousal/Alertness: Awake/alert Behavior During Therapy: WFL for tasks assessed/performed Overall Cognitive Status: Within Functional Limits for tasks assessed                                          Exercises Total Joint Exercises Ankle Circles/Pumps: AROM, 10 reps, Both    General Comments        Pertinent Vitals/Pain Pain Assessment Pain Assessment: 0-10 Pain Score: 4  Pain Location: left knee Pain Descriptors / Indicators: Grimacing, Guarding Pain Intervention(s): Limited activity within patient's tolerance, Monitored during session, Premedicated before session, Repositioned, Ice applied    Home Living  Prior Function            PT Goals (current goals can now be found in the care plan section) Acute Rehab PT Goals Patient Stated Goal: Walk without pain PT Goal Formulation: With patient Time For Goal Achievement: 01/20/22 Potential to Achieve Goals: Good Progress towards PT goals: Progressing toward goals    Frequency    7X/week      PT Plan Current plan remains appropriate    Co-evaluation              AM-PAC PT "6 Clicks" Mobility   Outcome Measure  Help needed turning from your back to your side while in a flat bed without  using bedrails?: A Little Help needed moving from lying on your back to sitting on the side of a flat bed without using bedrails?: A Little Help needed moving to and from a bed to a chair (including a wheelchair)?: A Little Help needed standing up from a chair using your arms (e.g., wheelchair or bedside chair)?: A Little Help needed to walk in hospital room?: A Little Help needed climbing 3-5 steps with a railing? : A Lot 6 Click Score: 17    End of Session Equipment Utilized During Treatment: Gait belt Activity Tolerance: Other (comment) (diaphoretic) Patient left: in chair;with call bell/phone within reach;with chair alarm set;with family/visitor present   PT Visit Diagnosis: Pain;Other abnormalities of gait and mobility (R26.89) Pain - Right/Left: Left Pain - part of body: Knee     Time: 8811-0315 PT Time Calculation (min) (ACUTE ONLY): 19 min  Charges:  $Gait Training: 8-22 mins                     Baxter Flattery, PT  Acute Rehab Dept [facility]) [phone]  [facility] Weekend Pager [facility] only)  [phone]  01/14/2022    [place] 01/14/2022, 11:38 AM

## 2022-01-14 NOTE — Discharge Instructions (Signed)

## 2022-01-14 NOTE — Plan of Care (Signed)

## 2022-01-14 NOTE — Progress Notes (Signed)
Physical Therapy Treatment Patient Details Name: Kayla Larsen MRN: [id number] DOB: [birth year] Today's Date: 01/14/2022   History of Present Illness Pt is a 52yo female presenting s/p L-TKA on 01/13/22. PMH: R-TKA 01/14/21, HTN.  `    PT Comments    Progressing toward PT goals, having some issues with pain but overall better per pt report. Amb distance ltd by pain although improving knee ROM. Excellent quad activation with TKA exercises.  Likely ready for d/c tomorrow, has a flight of stairs   Recommendations for follow up therapy are one component of a multi-disciplinary discharge planning process, led by the attending physician.  Recommendations may be updated based on patient status, additional functional criteria and insurance authorization.  Follow Up Recommendations  Follow physician's recommendations for discharge plan and follow up therapies     Assistance Recommended at Discharge Intermittent Supervision/Assistance  Patient can return home with the following A little help with walking and/or transfers;A little help with bathing/dressing/bathroom;Assistance with cooking/housework;Assist for transportation;Help with stairs or ramp for entrance   Equipment Recommendations  None recommended by PT    Recommendations for Other Services       Precautions / Restrictions Precautions Precautions: Knee;Fall Precaution Comments: able to perform SLR without assist Required Braces or Orthoses: Knee Immobilizer - Left Knee Immobilizer - Left: Discontinue once straight leg raise with < 10 degree lag Restrictions Weight Bearing Restrictions: No Other Position/Activity Restrictions: wbat     Mobility  Bed Mobility               General bed mobility comments: in recliner on arrival and returned to same    Transfers Overall transfer level: Needs assistance Equipment used: Rolling walker (2 wheels) Transfers: Sit to/from Stand Sit to Stand: Min guard, Supervision            General transfer comment: cues for hand placement    Ambulation/Gait Ambulation/Gait assistance: Min guard, Supervision Gait Distance (Feet): 15 Feet Assistive device: Rolling walker (2 wheels) Gait Pattern/deviations: Step-to pattern, Decreased stance time - left       General Gait Details: cues for sequence and RW position from self. limited by pain   Stairs             Wheelchair Mobility    Modified Rankin (Stroke Patients Only)       Balance     Sitting balance-Leahy Scale: Good       Standing balance-Leahy Scale: Poor                              Cognition Arousal/Alertness: Awake/alert Behavior During Therapy: WFL for tasks assessed/performed Overall Cognitive Status: Within Functional Limits for tasks assessed                                          Exercises Total Joint Exercises Ankle Circles/Pumps: AROM, 10 reps, Both Quad Sets: AROM, Both, 10 reps Heel Slides: AAROM, Left, 10 reps Hip ABduction/ADduction: AROM, Left, 10 reps Straight Leg Raises: AROM, AAROM, Left, 10 reps    General Comments        Pertinent Vitals/Pain Pain Assessment Pain Assessment: 0-10 Pain Score: 4  Pain Location: left knee Pain Descriptors / Indicators: Grimacing, Guarding Pain Intervention(s): Limited activity within patient's tolerance, Monitored during session, Premedicated before session, Repositioned, Ice applied    Home Living  Prior Function            PT Goals (current goals can now be found in the care plan section) Acute Rehab PT Goals Patient Stated Goal: Walk without pain PT Goal Formulation: With patient Time For Goal Achievement: 01/20/22 Potential to Achieve Goals: Good Progress towards PT goals: Progressing toward goals    Frequency    7X/week      PT Plan Current plan remains appropriate    Co-evaluation              AM-PAC PT "6 Clicks" Mobility    Outcome Measure  Help needed turning from your back to your side while in a flat bed without using bedrails?: A Little Help needed moving from lying on your back to sitting on the side of a flat bed without using bedrails?: A Little Help needed moving to and from a bed to a chair (including a wheelchair)?: A Little Help needed standing up from a chair using your arms (e.g., wheelchair or bedside chair)?: A Little Help needed to walk in hospital room?: A Little Help needed climbing 3-5 steps with a railing? : A Lot 6 Click Score: 17    End of Session Equipment Utilized During Treatment: Gait belt Activity Tolerance: Other (comment) (diaphoretic) Patient left: in chair;with call bell/phone within reach;with chair alarm set;with family/visitor present   PT Visit Diagnosis: Pain;Other abnormalities of gait and mobility (R26.89) Pain - Right/Left: Left Pain - part of body: Knee     Time: 6720-9470 PT Time Calculation (min) (ACUTE ONLY): 20 min  Charges:  $Therapeutic Exercise: 8-22 mins                     Baxter Flattery, PT  Acute Rehab Dept [facility]) [phone]  [facility] Weekend Pager [facility] only)  [phone]  01/14/2022    [facility] 01/14/2022, 3:14 PM

## 2022-01-15 NOTE — Plan of Care (Signed)
  Problem: Coping: Goal: Level of anxiety will decrease Outcome: Progressing   Problem: Pain Managment: Goal: General experience of comfort will improve Outcome: Progressing   

## 2022-01-15 NOTE — Plan of Care (Signed)
  Problem: Coping: Goal: Level of anxiety will decrease 01/15/2022 0657 by Blase Mess, RN Outcome: Progressing 01/15/2022 0656 by Blase Mess, RN Outcome: Progressing

## 2022-01-15 NOTE — Progress Notes (Signed)
Physical Therapy Treatment Patient Details Name: Kayla Larsen MRN: [id number] DOB: [birth year] Today's Date: 01/15/2022   History of Present Illness Pt is a 52yo female presenting s/p L-TKA on 01/13/22. PMH: R-TKA 01/14/21, HTN.  `    PT Comments    Pt just back to bed,  agreeable to exercises, tol well. Discussed and will review stairs in am. Pt should be ready for d/c tomorrow.    Recommendations for follow up therapy are one component of a multi-disciplinary discharge planning process, led by the attending physician.  Recommendations may be updated based on patient status, additional functional criteria and insurance authorization.  Follow Up Recommendations  Follow physician's recommendations for discharge plan and follow up therapies     Assistance Recommended at Discharge Intermittent Supervision/Assistance  Patient can return home with the following A little help with walking and/or transfers;A little help with bathing/dressing/bathroom;Assistance with cooking/housework;Assist for transportation;Help with stairs or ramp for entrance   Equipment Recommendations  None recommended by PT    Recommendations for Other Services       Precautions / Restrictions Precautions Precautions: Knee;Fall Precaution Comments: able to perform SLR without assist Required Braces or Orthoses: Knee Immobilizer - Left Knee Immobilizer - Left: Discontinue once straight leg raise with < 10 degree lag Restrictions Weight Bearing Restrictions: No LLE Weight Bearing: Weight bearing as tolerated Other Position/Activity Restrictions: wbat     Mobility  Bed Mobility   Bed Mobility: Supine to Sit, Sit to Supine     Supine to sit: Supervision Sit to supine: Supervision   General bed mobility comments: pt is able to use gait  belt as leg lifter    Transfers Overall transfer level: Needs assistance Equipment used: Rolling walker (2 wheels) Transfers: Sit to/from Stand Sit to Stand:  Supervision           General transfer comment: cues for hand placement    Ambulation/Gait Ambulation/Gait assistance: Min guard, Supervision Gait Distance (Feet): 120 Feet (15' more) Assistive device: Rolling walker (2 wheels) Gait Pattern/deviations: Step-to pattern, Decreased stance time - left       General Gait Details: cues for sequence and RW position from self.   Stairs             Wheelchair Mobility    Modified Rankin (Stroke Patients Only)       Balance Overall balance assessment: Needs assistance Sitting-balance support: Feet supported, No upper extremity supported Sitting balance-Leahy Scale: Good       Standing balance-Leahy Scale: Fair                              Cognition Arousal/Alertness: Awake/alert Behavior During Therapy: WFL for tasks assessed/performed Overall Cognitive Status: Within Functional Limits for tasks assessed                                          Exercises Total Joint Exercises Ankle Circles/Pumps: AROM, 10 reps, Both Quad Sets: AROM, Both, 10 reps Short Arc Quad: AROM, Left, 10 reps, AAROM Heel Slides: AAROM, Left, 10 reps Hip ABduction/ADduction: AROM, Left, 10 reps Straight Leg Raises: AROM, AAROM, Left, 10 reps    General Comments        Pertinent Vitals/Pain Pain Assessment Pain Assessment: 0-10 Pain Score: 5  Pain Location: left knee Pain Descriptors / Indicators: Grimacing, Guarding Pain Intervention(s): Limited  activity within patient's tolerance, Monitored during session, Premedicated before session, Ice applied    Home Living                          Prior Function            PT Goals (current goals can now be found in the care plan section) Acute Rehab PT Goals Patient Stated Goal: Walk without pain PT Goal Formulation: With patient Time For Goal Achievement: 01/20/22 Potential to Achieve Goals: Good Progress towards PT goals: Progressing  toward goals    Frequency    7X/week      PT Plan Current plan remains appropriate    Co-evaluation              AM-PAC PT "6 Clicks" Mobility   Outcome Measure  Help needed turning from your back to your side while in a flat bed without using bedrails?: A Little Help needed moving from lying on your back to sitting on the side of a flat bed without using bedrails?: A Little Help needed moving to and from a bed to a chair (including a wheelchair)?: A Little Help needed standing up from a chair using your arms (e.g., wheelchair or bedside chair)?: A Little Help needed to walk in hospital room?: A Little Help needed climbing 3-5 steps with a railing? : A Lot 6 Click Score: 17    End of Session   Activity Tolerance: Other (comment);Patient tolerated treatment well;No increased pain Patient left: with call bell/phone within reach;in bed;with bed alarm set   PT Visit Diagnosis: Pain;Other abnormalities of gait and mobility (R26.89) Pain - Right/Left: Left Pain - part of body: Knee     Time: 1410-1434 PT Time Calculation (min) (ACUTE ONLY): 24 min  Charges:  $Therapeutic Exercise: 23-37 mins                     Baxter Flattery, PT  Acute Rehab Dept [facility]) [phone]  [facility] Weekend Pager [facility] only)  [phone]  01/15/2022    [facility] 01/15/2022, 2:37 PM

## 2022-01-15 NOTE — Progress Notes (Addendum)
Patient ID: Kayla Larsen, female   DOB: 1970-10-11, 51 y.o.   MRN: [id number]   Subjective: 2 Days Post-Op Procedure(s) (LRB): LEFT TOTAL KNEE ARTHROPLASTY (Left) Patient reports pain as 10 on 0-10 scale.  Was sleeping when I walked in.   Objective: Vital signs in last 24 hours: Temp:  [98.4 F (36.9 C)-99.9 F (37.7 C)] 99.9 F (37.7 C) (01/07 0949) Pulse Rate:  [95-106] 97 (01/07 0949) Resp:  [17-20] 18 (01/07 0949) BP: (118-157)/(63-89) 144/80 (01/07 0949) SpO2:  [95 %-100 %] 95 % (01/07 0949)  Intake/Output from previous day: 01/06 0701 - 01/07 0700 In: 1080 [P.O.:1080] Out: 100 [Urine:100] Intake/Output this shift: No intake/output data recorded.  Recent Labs    01/14/22 0339  HGB 9.4*   Recent Labs    01/14/22 0339  WBC 12.6*  RBC 4.04  HCT 30.3*  PLT 308   Recent Labs    01/13/22 1235 01/14/22 0339  NA 137 138  K 3.8 4.4  CL 103 105  CO2 25 26  BUN 14 18  CREATININE 0.62 0.61  GLUCOSE 127* 139*  CALCIUM 9.0 9.3   No results for input(s): "LABPT", "INR" in the last 72 hours.  Neurologically intact No results found.  Assessment/Plan: 2 Days Post-Op Procedure(s) (LRB): LEFT TOTAL KNEE ARTHROPLASTY (Left) Up with therapy. She states pain was 15 with motion. Has been sleeping after pain meds. Last TKA was in 4days. Right TKA doing well.   Marybelle Killings 01/15/2022, 10:09 AM

## 2022-01-15 NOTE — Progress Notes (Signed)
Physical Therapy Treatment Patient Details Name: Kayla Larsen MRN: [id number] DOB: 09-03-1970 Today's Date: 01/15/2022   History of Present Illness Pt is a 52yo female presenting s/p L-TKA on 01/13/22. PMH: R-TKA 01/14/21, HTN.  `    PT Comments    Pt continues to progress. Pt agreeable to amb, improved activity tolerance.  pain incr slightly  so will defer TKA exercises until pm. Pt is planning to d/c tomorrow   Recommendations for follow up therapy are one component of a multi-disciplinary discharge planning process, led by the attending physician.  Recommendations may be updated based on patient status, additional functional criteria and insurance authorization.  Follow Up Recommendations  Follow physician's recommendations for discharge plan and follow up therapies     Assistance Recommended at Discharge Intermittent Supervision/Assistance  Patient can return home with the following A little help with walking and/or transfers;A little help with bathing/dressing/bathroom;Assistance with cooking/housework;Assist for transportation;Help with stairs or ramp for entrance   Equipment Recommendations  None recommended by PT    Recommendations for Other Services       Precautions / Restrictions Precautions Precautions: Knee;Fall Precaution Booklet Issued: No Precaution Comments: able to perform SLR without assist Required Braces or Orthoses: Knee Immobilizer - Left Knee Immobilizer - Left: Discontinue once straight leg raise with < 10 degree lag Restrictions Weight Bearing Restrictions: No LLE Weight Bearing: Weight bearing as tolerated     Mobility  Bed Mobility   Bed Mobility: Supine to Sit, Sit to Supine     Supine to sit: Supervision Sit to supine: Supervision   General bed mobility comments: pt is able to use gait  belt as leg lifter    Transfers Overall transfer level: Needs assistance Equipment used: Rolling walker (2 wheels) Transfers: Sit to/from Stand Sit to  Stand: Supervision           General transfer comment: cues for hand placement    Ambulation/Gait Ambulation/Gait assistance: Min guard, Supervision Gait Distance (Feet): 120 Feet (15' more) Assistive device: Rolling walker (2 wheels) Gait Pattern/deviations: Step-to pattern, Decreased stance time - left       General Gait Details: cues for sequence and RW position from self.   Stairs             Wheelchair Mobility    Modified Rankin (Stroke Patients Only)       Balance Overall balance assessment: Needs assistance Sitting-balance support: Feet supported, No upper extremity supported Sitting balance-Leahy Scale: Good       Standing balance-Leahy Scale: Fair                              Cognition Arousal/Alertness: Awake/alert Behavior During Therapy: WFL for tasks assessed/performed Overall Cognitive Status: Within Functional Limits for tasks assessed                                          Exercises      General Comments        Pertinent Vitals/Pain Pain Assessment Pain Assessment: 0-10 Pain Score: 5  Pain Location: left knee Pain Descriptors / Indicators: Grimacing, Guarding Pain Intervention(s): Monitored during session, Limited activity within patient's tolerance, Premedicated before session, Repositioned, Patient requesting pain meds-RN notified, Ice applied    Home Living  Prior Function            PT Goals (current goals can now be found in the care plan section) Acute Rehab PT Goals Patient Stated Goal: Walk without pain PT Goal Formulation: With patient Time For Goal Achievement: 01/20/22 Potential to Achieve Goals: Good Progress towards PT goals: Progressing toward goals    Frequency    7X/week      PT Plan Current plan remains appropriate    Co-evaluation              AM-PAC PT "6 Clicks" Mobility   Outcome Measure  Help needed turning from  your back to your side while in a flat bed without using bedrails?: A Little Help needed moving from lying on your back to sitting on the side of a flat bed without using bedrails?: A Little Help needed moving to and from a bed to a chair (including a wheelchair)?: A Little Help needed standing up from a chair using your arms (e.g., wheelchair or bedside chair)?: A Little Help needed to walk in hospital room?: A Little Help needed climbing 3-5 steps with a railing? : A Little 6 Click Score: 18    End of Session Equipment Utilized During Treatment: Gait belt Activity Tolerance: Patient tolerated treatment well Patient left: in bed;with call bell/phone within reach;with bed alarm set Nurse Communication: Mobility status PT Visit Diagnosis: Pain;Other abnormalities of gait and mobility (R26.89) Pain - Right/Left: Left Pain - part of body: Knee     Time: 1017-1040 PT Time Calculation (min) (ACUTE ONLY): 23 min  Charges:  $Gait Training: 23-37 mins                     Angellynn Kimberlin, PT  Acute Rehab Dept [facility]) [phone]  [facility] Weekend Pager [facility] only)  [phone]  01/15/2022    [facility] 01/15/2022, 10:49 AM

## 2022-01-15 NOTE — Plan of Care (Signed)
  Problem: Education: Goal: Knowledge of General Education information will improve Description: Including pain rating scale, medication(s)/side effects and non-pharmacologic comfort measures Outcome: Progressing   Problem: Activity: Goal: Risk for activity intolerance will decrease Outcome: Progressing   

## 2022-01-16 NOTE — Progress Notes (Signed)
Patient ID: Kayla Larsen, female   DOB: April 02, 1970, 52 y.o.   MRN: [id number] The patient is doing well overall.  Her vital signs are stable.  Her left operative knee is stable.  The goal is to discharge her home today after stair training.

## 2022-01-16 NOTE — Discharge Summary (Signed)
Patient ID: Kayla Larsen MRN: [id number] DOB/AGE: 1970/12/29 52 y.o.  Admit date: 01/13/2022 Discharge date: 01/16/2022  Admission Diagnoses:  Principal Problem:   Unilateral primary osteoarthritis, left knee Active Problems:   Status post total left knee replacement   Discharge Diagnoses:  Same  Past Medical History:  Diagnosis Date   Acanthosis nigricans    Anemia    Arthritis of knee    History of blood transfusion 2009   2 units after hysterectomy   Hypertension    Obesity    Wears glasses     Surgeries: Procedure(s): LEFT TOTAL KNEE ARTHROPLASTY on 01/13/2022   Consultants:   Discharged Condition: Improved  Hospital Course: Kayla Larsen is an 52 y.o. female who was admitted 01/13/2022 for operative treatment ofUnilateral primary osteoarthritis, left knee. Patient has severe unremitting pain that affects sleep, daily activities, and work/hobbies. After pre-op clearance the patient was taken to the operating room on 01/13/2022 and underwent  Procedure(s): LEFT TOTAL KNEE ARTHROPLASTY.    Patient was given perioperative antibiotics:  Anti-infectives (From admission, onward)    Start     Dose/Rate Route Frequency Ordered Stop   01/13/22 1600  ceFAZolin (ANCEF) IVPB 1 g/50 mL premix        1 g 100 mL/hr over 30 Minutes Intravenous Every 6 hours 01/13/22 1151 01/13/22 2213   01/13/22 0745  ceFAZolin (ANCEF) IVPB 2g/100 mL premix        2 g 200 mL/hr over 30 Minutes Intravenous On call to O.R. 01/13/22 0734 01/13/22 1010        Patient was given sequential compression devices, early ambulation, and chemoprophylaxis to prevent DVT.  Patient benefited maximally from hospital stay and there were no complications.    Recent vital signs: Patient Vitals for the past 24 hrs:  BP Temp Temp src Pulse Resp SpO2  01/16/22 0630 (!) 107/57 98.7 F (37.1 C) Oral 89 16 100 %  01/15/22 2227 117/70 98.5 F (36.9 C) Oral 89 16 97 %  01/15/22 1325 136/78 99 F (37.2 C) -- (!)  102 16 97 %  01/15/22 0949 (!) 144/80 99.9 F (37.7 C) -- 97 18 95 %     Recent laboratory studies:  Recent Labs    01/13/22 1235 01/14/22 0339  WBC  --  12.6*  HGB  --  9.4*  HCT  --  30.3*  PLT  --  308  NA 137 138  K 3.8 4.4  CL 103 105  CO2 25 26  BUN 14 18  CREATININE 0.62 0.61  GLUCOSE 127* 139*  CALCIUM 9.0 9.3     Discharge Medications:   Allergies as of 01/16/2022       Reactions   Amlodipine    Ankle swelling at '10mg'$    Lisinopril    Cough        Medication List     TAKE these medications    allopurinol 100 MG tablet Commonly known as: ZYLOPRIM Take 1 tablet (100 mg total) by mouth daily.   aspirin 81 MG chewable tablet Chew 1 tablet (81 mg total) by mouth 2 (two) times daily.   CRANBERRY PO Take 1 capsule by mouth daily.   diclofenac Sodium 1 % Gel Commonly known as: VOLTAREN Apply 1 Application topically daily.   ferrous sulfate 325 (65 FE) MG tablet Take 1 tablet (325 mg total) by mouth daily.   meloxicam 7.5 MG tablet Commonly known as: MOBIC TAKE 1 TABLET(7.5 MG) BY MOUTH TWICE DAILY  multivitamin with minerals Tabs tablet Take 1 tablet by mouth daily. One A Day for Women 50+   Olmesartan-amLODIPine-HCTZ 40-5-12.5 MG Tabs TAKE 1 TABLET BY MOUTH DAILY   oxyCODONE 5 MG immediate release tablet Commonly known as: Oxy IR/ROXICODONE Take 1-2 tablets (5-10 mg total) by mouth every 4 (four) hours as needed for moderate pain (pain score 4-6).   potassium chloride 10 MEQ tablet Commonly known as: KLOR-CON TAKE 1 TABLET(10 MEQ) BY MOUTH DAILY   tiZANidine 4 MG tablet Commonly known as: Zanaflex Take 1 tablet (4 mg total) by mouth every 6 (six) hours as needed for muscle spasms.   tolterodine 4 MG 24 hr capsule Commonly known as: DETROL LA TAKE 1 CAPSULE(4 MG) BY MOUTH DAILY   VITAMIN C PO Take 2,000 mg by mouth daily.   Vitamin D 50 MCG (2000 UT) tablet Take 1 tablet (2,000 Units total) by mouth daily.   Wegovy 0.25  MG/0.5ML Soaj Generic drug: Semaglutide-Weight Management Inject 0.25 mg into the skin once a week.               Durable Medical Equipment  (From admission, onward)           Start     Ordered   01/13/22 1508  DME 3 n 1  Once        01/13/22 1507   01/13/22 1508  DME Walker rolling  Once       Question Answer Comment  Walker: With 5 Inch Wheels   Patient needs a walker to treat with the following condition Status post total left knee replacement      01/13/22 1507            Diagnostic Studies: DG Knee Left Port  Result Date: 01/13/2022 CLINICAL DATA:  Status post total knee replacement EXAM: PORTABLE LEFT KNEE-2 VIEW COMPARISON:  Preop x-ray 03/29/2016 FINDINGS: Surgical changes of total knee arthroplasty with cemented femoral and tibial component. Patellar button. Expected alignment. Osteopenia. No fracture or dislocation. Scattered soft tissue gas including along the joint space with thickening. Midline anterior skin staples. Overlapping artifacts. Imaging was obtained to aid in treatment. IMPRESSION: Acute surgical changes of total knee arthroplasty. Electronically Signed   By: Jill Side M.D.   On: 01/13/2022 12:33    Disposition: Discharge disposition: 01-Home or [facility]     [name], MD Follow up in 2 week(s).   Specialty: Orthopedic Surgery Contact information: [address] [place] [address] [phone]                  Signed: [name] 01/16/2022, 7:30 AM

## 2022-01-16 NOTE — Plan of Care (Signed)
  Problem: Pain Management: Goal: Pain level will decrease with appropriate interventions Outcome: Progressing   

## 2022-01-16 NOTE — TOC Transition Note (Signed)
Transition of Care [facility]) - CM/SW Discharge Note  Patient Details  Name: Kayla Larsen MRN: [id number] Date of Birth: 24-Jan-1970  Transition of Care [facility]) CM/SW Contact:  Sherie Don, LCSW Phone Number: 01/16/2022, 9:30 AM  Clinical Narrative: Patient is expected to discharge home after working with PT. CSW met with patient to confirm discharge plan and needs. Patient will go home with HHPT, which was prearranged with [place]. Patient has a rolling walker, raised toilet seat, and cane at home so there are no DME needs at this time. TOC signing off.    Final next level of care: Home w Home Health Services Barriers to Discharge: No Barriers Identified  Patient Goals and CMS Choice CMS Medicare.gov Compare Post Acute Care list provided to:: Patient Choice offered to / list presented to : Patient  Discharge Plan and Services Additional resources added to the After Visit Summary for          DME Arranged: N/A DME Agency: NA HH Arranged: PT HH Agency: Magazine features editor spoke with at [place] in orthopedist's office  Social Determinants of Health ([place]) Interventions [place] City: No Food Insecurity (01/13/2022)  Housing: Low Risk  (01/13/2022)  Transportation Needs: No Transportation Needs (01/13/2022)  Utilities: Not At Risk (01/13/2022)  Depression (PHQ2-9): Low Risk  (12/29/2021)  Tobacco Use: Medium Risk (01/13/2022)   Readmission Risk Interventions     No data to display

## 2022-01-16 NOTE — Progress Notes (Signed)
The patient is alert and oriented and has been seen by her physician. The orders for discharge were written. IV has been removed. Went over discharge instructions with patient. Aquacel dressing is clean, dry, and intact. She is being discharged via wheelchair with all of her belongings.

## 2022-01-16 NOTE — Progress Notes (Signed)
Physical Therapy Treatment Patient Details Name: Kayla Larsen MRN: [id number] DOB: Jan 19, 1970 Today's Date: 01/16/2022   History of Present Illness Pt is a 52yo female presenting s/p L-TKA on 01/13/22. PMH: R-TKA 01/14/21, HTN.  `    PT Comments    Pt making improvements in all areas of mobility. Reviewed stairs/safety. Pt  is ready to d/c from PT standpoint with family assist as needed.  Recommendations for follow up therapy are one component of a multi-disciplinary discharge planning process, led by the attending physician.  Recommendations may be updated based on patient status, additional functional criteria and insurance authorization.  Follow Up Recommendations  Follow physician's recommendations for discharge plan and follow up therapies     Assistance Recommended at Discharge Intermittent Supervision/Assistance  Patient can return home with the following A little help with walking and/or transfers;A little help with bathing/dressing/bathroom;Assistance with cooking/housework;Assist for transportation;Help with stairs or ramp for entrance   Equipment Recommendations  None recommended by PT    Recommendations for Other Services       Precautions / Restrictions Precautions Precautions: Knee;Fall Precaution Comments: able to perform SLR without assist Required Braces or Orthoses: Knee Immobilizer - Left Knee Immobilizer - Left: Discontinue once straight leg raise with < 10 degree lag Restrictions Weight Bearing Restrictions: No Other Position/Activity Restrictions: wbat     Mobility  Bed Mobility Overal bed mobility: Needs Assistance       Supine to sit: Modified independent (Device/Increase time) Sit to supine: Modified independent (Device/Increase time)        Transfers Overall transfer level: Needs assistance Equipment used: Rolling walker (2 wheels) Transfers: Sit to/from Stand Sit to Stand: Supervision, Modified independent (Device/Increase time)            General transfer comment: demonstrates carry over from previous sessions. good technique from bed and BSC    Ambulation/Gait Ambulation/Gait assistance: Supervision Gait Distance (Feet): 80 Feet Assistive device: Rolling walker (2 wheels) Gait Pattern/deviations: Step-to pattern, Decreased stance time - left, Step-through pattern       General Gait Details: beginning step through with focus on L knee flexion in swing and heel strike on initial contact. wt shift to LLE improving   Stairs Stairs: Yes Stairs assistance: Min guard Stair Management: One rail Left, One rail Right, Step to pattern, Sideways Number of Stairs: 5 (x2) General stair comments: cues for sequence and technique. pt using one rail then 2, states she normally braces on wall on right and holds rail L; dtr and son will be assisting, good stability, no LOB   Wheelchair Mobility    Modified Rankin (Stroke Patients Only)       Balance                                            Cognition Arousal/Alertness: Awake/alert Behavior During Therapy: WFL for tasks assessed/performed Overall Cognitive Status: Within Functional Limits for tasks assessed                                          Exercises Total Joint Exercises Ankle Circles/Pumps: AROM, 10 reps, Both    General Comments        Pertinent Vitals/Pain Pain Assessment Pain Assessment: Faces Pain Score: 5  Pain Location: left knee Pain Descriptors /  Indicators: Grimacing, Guarding Pain Intervention(s): Limited activity within patient's tolerance, Monitored during session, Premedicated before session, Ice applied    Home Living                          Prior Function            PT Goals (current goals can now be found in the care plan section) Acute Rehab PT Goals Patient Stated Goal: Walk without pain PT Goal Formulation: With patient Time For Goal Achievement: 01/20/22 Potential to  Achieve Goals: Good Progress towards PT goals: Progressing toward goals    Frequency    7X/week      PT Plan Current plan remains appropriate    Co-evaluation              AM-PAC PT "6 Clicks" Mobility   Outcome Measure  Help needed turning from your back to your side while in a flat bed without using bedrails?: A Little Help needed moving from lying on your back to sitting on the side of a flat bed without using bedrails?: A Little Help needed moving to and from a bed to a chair (including a wheelchair)?: A Little Help needed standing up from a chair using your arms (e.g., wheelchair or bedside chair)?: A Little Help needed to walk in hospital room?: A Little Help needed climbing 3-5 steps with a railing? : A Lot 6 Click Score: 17    End of Session   Activity Tolerance: Other (comment);Patient tolerated treatment well;No increased pain Patient left: with call bell/phone within reach;in bed;with bed alarm set   PT Visit Diagnosis: Pain;Other abnormalities of gait and mobility (R26.89) Pain - Right/Left: Left Pain - part of body: Knee     Time: 1200-1224 PT Time Calculation (min) (ACUTE ONLY): 24 min  Charges:  $Gait Training: 23-37 mins                     Crislyn Willbanks, PT  Acute Rehab Dept [facility]) [phone]  [facility] Weekend Pager [facility] only)  [phone]  01/16/2022    [facility] 01/16/2022, 12:56 PM

## 2022-01-16 NOTE — Plan of Care (Signed)
  Problem: Education: Goal: Knowledge of General Education information will improve Description: Including pain rating scale, medication(s)/side effects and non-pharmacologic comfort measures Outcome: Progressing   Problem: Health Behavior/Discharge Planning: Goal: Ability to manage health-related needs will improve Outcome: Progressing   Problem: Activity: Goal: Risk for activity intolerance will decrease Outcome: Progressing   

## 2022-01-17 ENCOUNTER — Encounter (HOSPITAL_COMMUNITY): Payer: Self-pay | Admitting: Orthopaedic Surgery

## 2022-01-20 ENCOUNTER — Other Ambulatory Visit: Payer: Self-pay | Admitting: Orthopaedic Surgery

## 2022-01-20 MED ORDER — OXYCODONE HCL 5 MG PO TABS: 5.0000 mg | ORAL_TABLET | ORAL | 0 refills | Status: DC | PRN

## 2022-01-26 ENCOUNTER — Ambulatory Visit (INDEPENDENT_AMBULATORY_CARE_PROVIDER_SITE_OTHER): Payer: 59 | Admitting: Orthopaedic Surgery

## 2022-01-26 ENCOUNTER — Other Ambulatory Visit: Payer: Self-pay

## 2022-01-26 ENCOUNTER — Encounter: Payer: Self-pay | Admitting: Orthopaedic Surgery

## 2022-01-26 DIAGNOSIS — Z96652 Presence of left artificial knee joint: Secondary | ICD-10-CM

## 2022-01-26 MED ORDER — OXYCODONE HCL 5 MG PO TABS: 5.0000 mg | ORAL_TABLET | ORAL | 0 refills | Status: DC | PRN

## 2022-01-26 NOTE — Progress Notes (Signed)
The patient comes in today for first postoperative visit status post a left total knee arthroplasty.  We have replaced her right knee remotely.  She is ambulate with a walker.  She does report significant left foot and ankle swelling.  She has been wearing compressive garments but not regularly.  She has been compliant with a baby aspirin twice daily.  The staples are removed from the left knee incision and Steri-Strips have been placed.  Her calf is soft.  Her foot and ankle is swollen but she can pump her foot back-and-forth and I want her to keep doing that as well as elevation and compressive garment to help with swelling.  I will send in some more pain medication for her.  We will get her set up for outpatient physical therapy hopefully upstairs here for further evaluation and treatment status post a left knee replacement.  From my standpoint I will see her back in 4 weeks for repeat exam but no x-rays are needed.  All questions and concerns were addressed and answered.

## 2022-01-30 ENCOUNTER — Ambulatory Visit: Payer: 59 | Admitting: Physical Therapy

## 2022-01-30 ENCOUNTER — Encounter: Payer: Self-pay | Admitting: Physical Therapy

## 2022-01-30 DIAGNOSIS — M25662 Stiffness of left knee, not elsewhere classified: Secondary | ICD-10-CM

## 2022-01-30 DIAGNOSIS — R262 Difficulty in walking, not elsewhere classified: Secondary | ICD-10-CM | POA: Diagnosis not present

## 2022-01-30 DIAGNOSIS — R600 Localized edema: Secondary | ICD-10-CM

## 2022-01-30 DIAGNOSIS — M25562 Pain in left knee: Secondary | ICD-10-CM | POA: Diagnosis not present

## 2022-01-30 DIAGNOSIS — M6281 Muscle weakness (generalized): Secondary | ICD-10-CM | POA: Diagnosis not present

## 2022-01-30 NOTE — Therapy (Signed)
OUTPATIENT PHYSICAL THERAPY LOWER EXTREMITY EVALUATION   Patient Name: Kayla Larsen MRN: [id number] DOB:11/23/1970, 52 y.o., female Today's Date: 01/30/2022  END OF SESSION:  PT End of Session - 01/30/22 1520     Visit Number 1    Number of Visits 16    Date for PT Re-Evaluation 03/13/22    Authorization Type [employer]    Authorization Time Period 01/30/22 to 03/27/22    PT Start Time 1442   pt late   PT Stop Time 1512    PT Time Calculation (min) 30 min    Activity Tolerance Patient tolerated treatment well    Behavior During Therapy [facility] for tasks assessed/performed             Past Medical History:  Diagnosis Date   Acanthosis nigricans    Anemia    Arthritis of knee    History of blood transfusion 2009   2 units after hysterectomy   Hypertension    Obesity    Wears glasses    Past Surgical History:  Procedure Laterality Date   CESAREAN SECTION     x 2   COLONOSCOPY  2009   done for anemia eval; Dr. Benson [place]   COLONOSCOPY  01/25/2018   multiple aphthous ulcers in terminal ileum likely d/t NSAIDs, diverticulosis in sigmoid colon, Dr. Jauca Cellar   KNEE SURGERY  10/2015   knot removed from R knee   LAPAROTOMY Left 07/30/2012   Procedure: LEFT SALPINGO OOPHERECTOMY,  OMENTECTOMY, AND LYSIS OF ADHESIONS;  Surgeon: Alvino Chapel, MD;  Location: [facility];  Service: Gynecology;  Laterality: Left;   OOPHORECTOMY  2014   left    TOTAL ABDOMINAL HYSTERECTOMY  2009   TOTAL KNEE ARTHROPLASTY Right 01/14/2021   Procedure: Right TOTAL KNEE ARTHROPLASTY;  Surgeon: [name], MD;  Location: [facility];  Service: Orthopedics;  Laterality: Right;  RNFA   TOTAL KNEE ARTHROPLASTY Left 01/13/2022   Procedure: LEFT TOTAL KNEE ARTHROPLASTY;  Surgeon: [name], MD;  Location: [facility];  Service: Orthopedics;  Laterality: Left;   Patient Active Problem List   Diagnosis Date Noted   Status post total left knee replacement 01/13/2022   BMI 40.0-44.9,  adult ([place]) 01/05/2022   Unilateral primary osteoarthritis, left knee 10/31/2021   Status post total right knee replacement 01/16/2021   Encounter for health maintenance examination in adult 12/27/2020   Screening for heart disease 12/27/2020   Iron deficiency 12/27/2020   Screening for diabetes mellitus 12/27/2020   BMI 38.0-38.9,adult 12/27/2020   Hypokalemia 12/27/2020   Primary osteoarthritis of right knee 11/16/2020   H/O: hysterectomy 09/21/2020   Vaccine counseling 11/20/2019   Encounter for screening mammogram for malignant neoplasm of breast 11/20/2019   Gout of foot 11/20/2019   Primary osteoarthritis of both knees 05/14/2019   Diverticulosis 11/19/2018   Vitamin D deficiency 11/24/2016   Overactive bladder 05/03/2015   Essential hypertension 05/03/2015   Arthritis, senescent 05/03/2015   Hypertriglyceridemia 05/03/2015   Impaired fasting blood sugar 05/03/2015    PCP: Jacklynn Barnacle   REFERRING PROVIDER: [name], MD  REFERRING DIAG: [phone] (ICD-10-CM) - Status post total left knee replacement  THERAPY DIAG:  Acute pain of left knee  Difficulty in walking, not elsewhere classified  Muscle weakness (generalized)  Stiffness of left knee, not elsewhere classified  Localized edema  Rationale for Evaluation and Treatment: Rehabilitation  ONSET DATE: 01/26/2022  SUBJECTIVE:   SUBJECTIVE STATEMENT:  Had TKR, its going fine its still  stiff and sore. I'm doing everything around the house just slow. No falls or anything. Surgeon told me he is expecting limited flexion as I had this problem on my other knee.   PERTINENT HISTORY: The patient comes in today for first postoperative visit status post a left total knee arthroplasty.  We have replaced her right knee remotely.  She is ambulate with a walker.  She does report significant left foot and ankle swelling.  She has been wearing compressive garments but not regularly.  She has been compliant  with a baby aspirin twice daily.   The staples are removed from the left knee incision and Steri-Strips have been placed.  Her calf is soft.  Her foot and ankle is swollen but she can pump her foot back-and-forth and I want her to keep doing that as well as elevation and compressive garment to help with swelling.   I will send in some more pain medication for her.  We will get her set up for outpatient physical therapy hopefully upstairs here for further evaluation and treatment status post a left knee replacement.  From my standpoint I will see her back in 4 weeks for repeat exam but no x-rays are needed.  All questions and concerns were addressed and answered.   PAIN:  Are you having pain? Yes: NPRS scale: 5/10 Pain location: left knee  Pain description: aching that wont go away, throbbing  Aggravating factors: swelling  Relieving factors: ice   PRECAUTIONS: None  WEIGHT BEARING RESTRICTIONS: No  FALLS:  Has patient fallen in last 6 months? 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 Ninfa Linden mid-February   OBJECTIVE:   DIAGNOSTIC FINDINGS:   PATIENT SURVEYS:  FOTO 53, predicted 71  COGNITION: Overall cognitive status: Within functional limits for tasks assessed     SENSATION: N/T around incision as appropriate post-op, no signs of drainage/erythema/infection at incision looking good   EDEMA:   As appropriate post-op  MUSCLE LENGTH:   POSTURE:   PALPATION:   LOWER EXTREMITY  ROM:  Active ROM Right eval Left eval  Hip flexion    Hip extension    Hip abduction    Hip adduction    Hip internal rotation    Hip external rotation    Knee flexion  59* supine heel slide   Knee extension  12* supine with heel prop   Ankle dorsiflexion    Ankle plantarflexion    Ankle inversion    Ankle eversion     (Blank rows = not tested)  LOWER EXTREMITY MMT:  MMT Right eval Left eval  Hip flexion    Hip extension    Hip abduction    Hip adduction    Hip internal rotation    Hip external rotation    Knee flexion 4+ 4  Knee extension 5 3 limited by pain   Ankle dorsiflexion    Ankle plantarflexion    Ankle inversion    Ankle eversion     (Blank rows =  not tested)  LOWER EXTREMITY SPECIAL TESTS:    FUNCTIONAL TESTS:  5 times sit to stand: 14.7 seconds use of BUEs  Timed up and go (TUG): 17.4 seconds RW  3MWT:   GAIT: Distance walked: 538f  Assistive device utilized: WEnvironmental consultant- 2 wheeled Level of assistance: Modified independence Comments: limited knee flexion B, flexed at hips    TODAY'S TREATMENT:                                                                                                                              DATE:   Eval  Objective measures/appropriate education   TherEx  Scifit x6 minutes seat 12 for ROM       PATIENT EDUCATION:  Education details: exam findings, POC Person educated: Patient Education method: ECustomer service managerEducation comprehension: verbalized understanding  HOME EXERCISE PROGRAM: HHPT program  ASSESSMENT:  CLINICAL IMPRESSION: Patient is a 52y.o. F who was seen today for physical therapy evaluation and treatment for skilled PT care s/p TKR. Exam is typical and expected including limited ROM, localized edema, impaired functional strength, gait impairment, and post-op pain. Will benefit from skilled PT services to address all limitations and assist in return to optimal level of function.    OBJECTIVE IMPAIRMENTS: Abnormal gait, decreased activity tolerance, decreased balance, decreased knowledge of use of DME, decreased mobility, difficulty walking, decreased ROM, decreased strength, hypomobility, increased edema, impaired flexibility, obesity, and pain.   ACTIVITY LIMITATIONS: sitting, standing, squatting, stairs, transfers, bed mobility, and locomotion level  PARTICIPATION LIMITATIONS: driving, shopping, community activity, occupation, and yard work  PERSONAL FACTORS: Age, Fitness, Past/current experiences, and Social background are also affecting patient's functional outcome.   REHAB POTENTIAL: Excellent  CLINICAL DECISION MAKING: Stable/uncomplicated  EVALUATION COMPLEXITY: Low   GOALS: Goals reviewed with patient? Yes  SHORT TERM GOALS: Target date: 02/20/2022   Will be compliant with appropriate progressive HEP  Baseline: Goal status: INITIAL  2.  Pain to be no more than 3/10 at worst  Baseline:  Goal status: INITIAL  3.  Will be able to ambulate with SPC and normalized gait mechanics household distances no increase in pain  Baseline:  Goal status: INITIAL  4.  Knee extension AROM to be no more than 5*, flexion AROM to be no less than 90* Baseline:  Goal status: INITIAL    LONG TERM GOALS: Target date: 03/13/2022    MMT to have improved by at least 1 grade in all weak groups  Baseline:  Goal status: INITIAL  2.  Knee flexion AROM to be no less than 105* Baseline:  Goal status: INITIAL  3.  Will be able to ambulate at least 10053fwith no device and good gait mechanics  Baseline:  Goal status: INITIAL  4.  Will be able to ascend/descend 15 steps with U rail, reciprocal pattern and no increase in pain  Baseline:  Goal status: INITIAL     PLAN:  PT FREQUENCY:  3x/week for first 3 weeks, then 2x/week for next 3 weeks (6 weeks total)  PT DURATION: 6 weeks  PLANNED INTERVENTIONS: Therapeutic exercises, Therapeutic activity,  Neuromuscular re-education, Balance training, Gait training, Patient/Family education, Self Care, Joint mobilization, Stair training, DME instructions, Dry Needling, Electrical stimulation, Cryotherapy, Moist heat, Taping, Ultrasound, Ionotophoresis '4mg'$ /ml Dexamethasone, Manual therapy, and Re-evaluation  PLAN FOR NEXT SESSION: ROM and strength, gait training    Deniece Ree PT DPT PN2  01/30/2022, 3:22 PM

## 2022-02-01 ENCOUNTER — Other Ambulatory Visit: Payer: Self-pay | Admitting: Orthopaedic Surgery

## 2022-02-01 ENCOUNTER — Ambulatory Visit: Payer: 59 | Admitting: Physical Therapy

## 2022-02-01 ENCOUNTER — Encounter: Payer: Self-pay | Admitting: Physical Therapy

## 2022-02-01 DIAGNOSIS — R600 Localized edema: Secondary | ICD-10-CM

## 2022-02-01 DIAGNOSIS — R262 Difficulty in walking, not elsewhere classified: Secondary | ICD-10-CM | POA: Diagnosis not present

## 2022-02-01 DIAGNOSIS — M25562 Pain in left knee: Secondary | ICD-10-CM | POA: Diagnosis not present

## 2022-02-01 DIAGNOSIS — M25662 Stiffness of left knee, not elsewhere classified: Secondary | ICD-10-CM | POA: Diagnosis not present

## 2022-02-01 DIAGNOSIS — M6281 Muscle weakness (generalized): Secondary | ICD-10-CM

## 2022-02-01 NOTE — Therapy (Signed)
OUTPATIENT PHYSICAL THERAPY LOWER EXTREMITY TREATMENT   Patient Name: Kayla Larsen MRN: [id number] DOB:05/14/1970, 52 y.o., female Today's Date: 02/01/2022  END OF SESSION:  PT End of Session - 02/01/22 1346     Visit Number 2    Number of Visits 16    Date for PT Re-Evaluation 03/13/22    Authorization Type [employer]    Authorization Time Period 01/30/22 to 03/27/22    PT Start Time 1341    PT Stop Time 1423    PT Time Calculation (min) 42 min    Activity Tolerance Patient tolerated treatment well    Behavior During Therapy [facility] for tasks assessed/performed              Past Medical History:  Diagnosis Date   Acanthosis nigricans    Anemia    Arthritis of knee    History of blood transfusion 2009   2 units after hysterectomy   Hypertension    Obesity    Wears glasses    Past Surgical History:  Procedure Laterality Date   CESAREAN SECTION     x 2   COLONOSCOPY  2009   done for anemia eval; Dr. Benson [place]   COLONOSCOPY  01/25/2018   multiple aphthous ulcers in terminal ileum likely d/t NSAIDs, diverticulosis in sigmoid colon, Dr. Oberlin Cellar   KNEE SURGERY  10/2015   knot removed from R knee   LAPAROTOMY Left 07/30/2012   Procedure: LEFT SALPINGO OOPHERECTOMY,  OMENTECTOMY, AND LYSIS OF ADHESIONS;  Surgeon: Alvino Chapel, MD;  Location: [facility];  Service: Gynecology;  Laterality: Left;   OOPHORECTOMY  2014   left    TOTAL ABDOMINAL HYSTERECTOMY  2009   TOTAL KNEE ARTHROPLASTY Right 01/14/2021   Procedure: Right TOTAL KNEE ARTHROPLASTY;  Surgeon: [name], MD;  Location: [facility];  Service: Orthopedics;  Laterality: Right;  RNFA   TOTAL KNEE ARTHROPLASTY Left 01/13/2022   Procedure: LEFT TOTAL KNEE ARTHROPLASTY;  Surgeon: [name], MD;  Location: [facility];  Service: Orthopedics;  Laterality: Left;   Patient Active Problem List   Diagnosis Date Noted   Status post total left knee replacement 01/13/2022   BMI 40.0-44.9, adult  ([place]) 01/05/2022   Unilateral primary osteoarthritis, left knee 10/31/2021   Status post total right knee replacement 01/16/2021   Encounter for health maintenance examination in adult 12/27/2020   Screening for heart disease 12/27/2020   Iron deficiency 12/27/2020   Screening for diabetes mellitus 12/27/2020   BMI 38.0-38.9,adult 12/27/2020   Hypokalemia 12/27/2020   Primary osteoarthritis of right knee 11/16/2020   H/O: hysterectomy 09/21/2020   Vaccine counseling 11/20/2019   Encounter for screening mammogram for malignant neoplasm of breast 11/20/2019   Gout of foot 11/20/2019   Primary osteoarthritis of both knees 05/14/2019   Diverticulosis 11/19/2018   Vitamin D deficiency 11/24/2016   Overactive bladder 05/03/2015   Essential hypertension 05/03/2015   Arthritis, senescent 05/03/2015   Hypertriglyceridemia 05/03/2015   Impaired fasting blood sugar 05/03/2015    PCP: Jacklynn Barnacle   REFERRING PROVIDER: [name], MD  REFERRING DIAG: [phone] (ICD-10-CM) - Status post total left knee replacement  THERAPY DIAG:  Acute pain of left knee  Difficulty in walking, not elsewhere classified  Muscle weakness (generalized)  Stiffness of left knee, not elsewhere classified  Localized edema  Rationale for Evaluation and Treatment: Rehabilitation  ONSET DATE: 01/26/2022  SUBJECTIVE:   SUBJECTIVE STATEMENT:  Nothing new going on, felt OK after last time.  Took oxy before PT today.   PERTINENT HISTORY: The patient comes in today for first postoperative visit status post a left total knee arthroplasty.  We have replaced her right knee remotely.  She is ambulate with a walker.  She does report significant left foot and ankle swelling.  She has been wearing compressive garments but not regularly.  She has been compliant with a baby aspirin twice daily.   The staples are removed from the left knee incision and Steri-Strips have been placed.  Her calf is  soft.  Her foot and ankle is swollen but she can pump her foot back-and-forth and I want her to keep doing that as well as elevation and compressive garment to help with swelling.   I will send in some more pain medication for her.  We will get her set up for outpatient physical therapy hopefully upstairs here for further evaluation and treatment status post a left knee replacement.  From my standpoint I will see her back in 4 weeks for repeat exam but no x-rays are needed.  All questions and concerns were addressed and answered.   PAIN:  Are you having pain? Yes: NPRS scale: 5/10 Pain location: left knee  Pain description: aching that wont go away, throbbing and pressure   Aggravating factors: swelling  Relieving factors: ice   PRECAUTIONS: None  WEIGHT BEARING RESTRICTIONS: No  FALLS:  Has patient fallen in last 6 months? 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 Ninfa Linden mid-February   OBJECTIVE:   DIAGNOSTIC FINDINGS:   PATIENT SURVEYS:  FOTO 53, predicted 46  COGNITION: Overall cognitive status: Within functional limits for tasks assessed     SENSATION: N/T around incision as appropriate post-op, no signs of drainage/erythema/infection at incision looking good   EDEMA:   As appropriate post-op  MUSCLE LENGTH:   POSTURE:   PALPATION:   LOWER EXTREMITY ROM:  Active ROM Right eval Left eval  Hip flexion    Hip extension    Hip abduction    Hip adduction    Hip internal  rotation    Hip external rotation    Knee flexion  59* supine heel slide   Knee extension  12* supine with heel prop   Ankle dorsiflexion    Ankle plantarflexion    Ankle inversion    Ankle eversion     (Blank rows = not tested)  LOWER EXTREMITY MMT:  MMT Right eval Left eval  Hip flexion    Hip extension    Hip abduction    Hip adduction    Hip internal rotation    Hip external rotation    Knee flexion 4+ 4  Knee extension 5 3 limited by pain   Ankle dorsiflexion    Ankle plantarflexion    Ankle inversion    Ankle eversion     (Blank rows = not tested)  LOWER EXTREMITY SPECIAL TESTS:    FUNCTIONAL TESTS:  5 times sit to stand: 14.7 seconds use of BUEs  Timed up and go (TUG): 17.4 seconds RW  3MWT:   GAIT: Distance walked: 520f  Assistive device utilized: WEnvironmental consultant- 2 wheeled Level of assistance: Modified independence Comments: limited knee flexion B, flexed at hips    TODAY'S TREATMENT:                                                                                                                              DATE:   02/01/22  TherEx  Scifit bike seat 12 full rotations x6 minutes, cues to not raise/hike hip Gastroc stretches L LE 3x30 seconds SAQs 0# 2x20 with 5 second holds LAQs 0# 2x10 sitting    Manual  Patella mobs all directions grade II-III  Retrograde massage LE elevated  Attempted knee extension OP- pt too resistive Knee flexion over pressure seated at edge of mat table     Eval  Objective measures/appropriate education   TherEx  Scifit x6 minutes seat 12 for ROM        PATIENT EDUCATION:  Education details: exam findings, POC Person educated: Patient Education method: ECustomer service managerEducation comprehension: verbalized understanding  HOME EXERCISE PROGRAM: HHPT program  ASSESSMENT:  CLINICAL IMPRESSION:  JChericearrives today doing OK, felt good after last session. Warmed up on Scifit for ROM then focused  today's session on ROM and manual interventions, also some quad activation as tolerated. L calf in spasm, making it hard for her to tolerate LE flat on the mat table. Homan's negative, no erythema or excess edema noted. Remains very motivated to improve. Will continue efforts.   OBJECTIVE IMPAIRMENTS: Abnormal gait, decreased activity tolerance, decreased balance, decreased knowledge of use of DME, decreased mobility, difficulty walking, decreased ROM, decreased strength, hypomobility, increased edema, impaired flexibility, obesity, and pain.   ACTIVITY LIMITATIONS: sitting, standing, squatting, stairs, transfers, bed mobility, and locomotion level  PARTICIPATION LIMITATIONS: driving, shopping, community activity, occupation, and yard work  PERSONAL FACTORS: Age, Fitness, Past/current experiences, and Social background are also affecting patient's functional outcome.   REHAB POTENTIAL: Excellent  CLINICAL DECISION MAKING: Stable/uncomplicated  EVALUATION COMPLEXITY: Low   GOALS: Goals reviewed with patient? Yes  SHORT TERM GOALS: Target date: 02/20/2022   Will be compliant with appropriate progressive HEP  Baseline: Goal status: INITIAL  2.  Pain to be no more than 3/10 at worst  Baseline:  Goal status: INITIAL  3.  Will be able to ambulate with SPC and normalized gait mechanics household distances no increase in pain  Baseline:  Goal status: INITIAL  4.  Knee extension AROM to be no more than 5*, flexion AROM to be no less than 90* Baseline:  Goal status: INITIAL    LONG TERM GOALS: Target date: 03/13/2022    MMT to have improved by at least 1 grade in all weak groups  Baseline:  Goal status: INITIAL  2.  Knee flexion AROM to be no less than 105* Baseline:  Goal status: INITIAL  3.  Will  be able to ambulate at least 1087f with no device and good gait mechanics  Baseline:  Goal status: INITIAL  4.  Will be able to ascend/descend 15 steps with U rail, reciprocal  pattern and no increase in pain  Baseline:  Goal status: INITIAL     PLAN:  PT FREQUENCY:  3x/week for first 3 weeks, then 2x/week for next 3 weeks (6 weeks total)  PT DURATION: 6 weeks  PLANNED INTERVENTIONS: Therapeutic exercises, Therapeutic activity, Neuromuscular re-education, Balance training, Gait training, Patient/Family education, Self Care, Joint mobilization, Stair training, DME instructions, Dry Needling, Electrical stimulation, Cryotherapy, Moist heat, Taping, Ultrasound, Ionotophoresis '4mg'$ /ml Dexamethasone, Manual therapy, and Re-evaluation  PLAN FOR NEXT SESSION: ROM and strength, gait training; HEP updates as appropriate    KDeniece ReePT DPT PN2  02/01/2022, 2:23 PM

## 2022-02-02 ENCOUNTER — Encounter: Payer: Self-pay | Admitting: Physical Therapy

## 2022-02-02 ENCOUNTER — Ambulatory Visit: Payer: 59 | Admitting: Physical Therapy

## 2022-02-02 DIAGNOSIS — R600 Localized edema: Secondary | ICD-10-CM

## 2022-02-02 DIAGNOSIS — M25562 Pain in left knee: Secondary | ICD-10-CM

## 2022-02-02 DIAGNOSIS — R262 Difficulty in walking, not elsewhere classified: Secondary | ICD-10-CM | POA: Diagnosis not present

## 2022-02-02 DIAGNOSIS — M25662 Stiffness of left knee, not elsewhere classified: Secondary | ICD-10-CM | POA: Diagnosis not present

## 2022-02-02 DIAGNOSIS — M6281 Muscle weakness (generalized): Secondary | ICD-10-CM

## 2022-02-02 MED ORDER — OXYCODONE HCL 5 MG PO TABS: 5.0000 mg | ORAL_TABLET | ORAL | 0 refills | Status: DC | PRN

## 2022-02-02 MED ORDER — TIZANIDINE HCL 4 MG PO TABS: 4.0000 mg | ORAL_TABLET | Freq: Three times a day (TID) | ORAL | 0 refills | Status: DC | PRN

## 2022-02-02 NOTE — Therapy (Signed)
OUTPATIENT PHYSICAL THERAPY LOWER EXTREMITY TREATMENT   Patient Name: Kayla Larsen MRN: [id number] DOB:November 14, 1970, 52 y.o., female Today's Date: 02/02/2022  END OF SESSION:  PT End of Session - 02/02/22 1525     Visit Number 3    Number of Visits 16    Date for PT Re-Evaluation 03/13/22    Authorization Type [employer]    Authorization Time Period 01/30/22 to 03/27/22    PT Start Time 1524    PT Stop Time 1606    PT Time Calculation (min) 42 min    Activity Tolerance Patient tolerated treatment well    Behavior During Therapy [facility] for tasks assessed/performed              Past Medical History:  Diagnosis Date   Acanthosis nigricans    Anemia    Arthritis of knee    History of blood transfusion 2009   2 units after hysterectomy   Hypertension    Obesity    Wears glasses    Past Surgical History:  Procedure Laterality Date   CESAREAN SECTION     x 2   COLONOSCOPY  2009   done for anemia eval; Dr. Benson [place]   COLONOSCOPY  01/25/2018   multiple aphthous ulcers in terminal ileum likely d/t NSAIDs, diverticulosis in sigmoid colon, Dr. Afton Cellar   KNEE SURGERY  10/2015   knot removed from R knee   LAPAROTOMY Left 07/30/2012   Procedure: LEFT SALPINGO OOPHERECTOMY,  OMENTECTOMY, AND LYSIS OF ADHESIONS;  Surgeon: Alvino Chapel, MD;  Location: [facility];  Service: Gynecology;  Laterality: Left;   OOPHORECTOMY  2014   left    TOTAL ABDOMINAL HYSTERECTOMY  2009   TOTAL KNEE ARTHROPLASTY Right 01/14/2021   Procedure: Right TOTAL KNEE ARTHROPLASTY;  Surgeon: [name], MD;  Location: [facility];  Service: Orthopedics;  Laterality: Right;  RNFA   TOTAL KNEE ARTHROPLASTY Left 01/13/2022   Procedure: LEFT TOTAL KNEE ARTHROPLASTY;  Surgeon: [name], MD;  Location: [facility];  Service: Orthopedics;  Laterality: Left;   Patient Active Problem List   Diagnosis Date Noted   Status post total left knee replacement 01/13/2022   BMI 40.0-44.9, adult  ([place]) 01/05/2022   Unilateral primary osteoarthritis, left knee 10/31/2021   Status post total right knee replacement 01/16/2021   Encounter for health maintenance examination in adult 12/27/2020   Screening for heart disease 12/27/2020   Iron deficiency 12/27/2020   Screening for diabetes mellitus 12/27/2020   BMI 38.0-38.9,adult 12/27/2020   Hypokalemia 12/27/2020   Primary osteoarthritis of right knee 11/16/2020   H/O: hysterectomy 09/21/2020   Vaccine counseling 11/20/2019   Encounter for screening mammogram for malignant neoplasm of breast 11/20/2019   Gout of foot 11/20/2019   Primary osteoarthritis of both knees 05/14/2019   Diverticulosis 11/19/2018   Vitamin D deficiency 11/24/2016   Overactive bladder 05/03/2015   Essential hypertension 05/03/2015   Arthritis, senescent 05/03/2015   Hypertriglyceridemia 05/03/2015   Impaired fasting blood sugar 05/03/2015    PCP: Jacklynn Barnacle   REFERRING PROVIDER: [name], MD  REFERRING DIAG: [phone] (ICD-10-CM) - Status post total left knee replacement  THERAPY DIAG:  Acute pain of left knee  Difficulty in walking, not elsewhere classified  Muscle weakness (generalized)  Stiffness of left knee, not elsewhere classified  Localized edema  Rationale for Evaluation and Treatment: Rehabilitation  ONSET DATE: 01/26/2022  SUBJECTIVE:   SUBJECTIVE STATEMENT: Her left knee feels a little achy due to the  weather.   PERTINENT HISTORY: The patient comes in today for first postoperative visit status post a left total knee arthroplasty.  We have replaced her right knee remotely.  She is ambulate with a walker.  She does report significant left foot and ankle swelling.  She has been wearing compressive garments but not regularly.  She has been compliant with a baby aspirin twice daily.   The staples are removed from the left knee incision and Steri-Strips have been placed.  Her calf is soft.  Her foot and ankle  is swollen but she can pump her foot back-and-forth and I want her to keep doing that as well as elevation and compressive garment to help with swelling.   I will send in some more pain medication for her.  We will get her set up for outpatient physical therapy hopefully upstairs here for further evaluation and treatment status post a left knee replacement.  From my standpoint I will see her back in 4 weeks for repeat exam but no x-rays are needed.  All questions and concerns were addressed and answered.   PAIN:  Are you having pain? Yes: NPRS scale: 5/10 Pain location: left knee  Pain description: aching that wont go away, throbbing and pressure   Aggravating factors: swelling  Relieving factors: ice   PRECAUTIONS: None  WEIGHT BEARING RESTRICTIONS: No  FALLS:  Has patient fallen in last 6 months? 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 Ninfa Linden mid-February   OBJECTIVE:   DIAGNOSTIC FINDINGS:   PATIENT SURVEYS:  FOTO 53, predicted 45  COGNITION: Overall cognitive status: Within functional limits for tasks assessed     SENSATION: N/T around incision as appropriate post-op, no signs of drainage/erythema/infection at incision looking good   EDEMA:   As appropriate post-op  MUSCLE LENGTH:   POSTURE:   PALPATION:   LOWER EXTREMITY ROM:  Active ROM Right eval Left eval  Hip flexion    Hip extension    Hip abduction    Hip adduction    Hip internal rotation    Hip external  rotation    Knee flexion  59* supine heel slide   Knee extension  12* supine with heel prop   Ankle dorsiflexion    Ankle plantarflexion    Ankle inversion    Ankle eversion     (Blank rows = not tested)  LOWER EXTREMITY MMT:  MMT Right eval Left eval  Hip flexion    Hip extension    Hip abduction    Hip adduction    Hip internal rotation    Hip external rotation    Knee flexion 4+ 4  Knee extension 5 3 limited by pain   Ankle dorsiflexion    Ankle plantarflexion    Ankle inversion    Ankle eversion     (Blank rows = not tested)  LOWER EXTREMITY SPECIAL TESTS:    FUNCTIONAL TESTS:  5 times sit to stand: 14.7 seconds use of BUEs  Timed up and go (  TUG): 17.4 seconds RW  3MWT:   GAIT: Distance walked: 531f  Assistive device utilized: WEnvironmental consultant- 2 wheeled Level of assistance: Modified independence Comments: limited knee flexion B, flexed at hips    TODAY'S TREATMENT:                                                                                                                              DATE:   02/02/22  TherEx  Scifit bike seat 13-12 full rotations x8 minutes Leg press 50# DL 2X10, 25# Lt only 2X10, slow eccentrics into max tolerated flexion and extension with seat lock out LAQs 2# 2x10 sitting  Seated knee flexion AAROM stretch 5 sec X 15 Seated hamstring stretch on left 30 sec X 3 Seated SLR on left X10 Supine extension stretch first 2-3 minutes of vaso   Manual Left knee PROM flexion and extension with overpressure to tolerance in sitting  -Vasopnuematic device X 10 min, medium compression, 34 deg to Lt knee   02/01/22   TherEx   Scifit bike seat 12 full rotations x6 minutes, cues to not raise/hike hip Gastroc stretches L LE 3x30 seconds SAQs 0# 2x20 with 5 second holds LAQs 0# 2x10 sitting      Manual   Patella mobs all directions grade II-III  Retrograde massage LE elevated  Attempted knee extension OP- pt too resistive Knee flexion  over pressure seated at edge of mat table        PATIENT EDUCATION:  Education details: exam findings, POC Person educated: Patient Education method: ECustomer service managerEducation comprehension: verbalized understanding  HOME EXERCISE PROGRAM: HHPT program  ASSESSMENT:  CLINICAL IMPRESSION: We worked to progress her left knee ROM and strength with good overall tolerance.  ROM appeared to improve after manual therapy. Vaso used at the end at her request to reduce the swelling and pain in her left knee.   OBJECTIVE IMPAIRMENTS: Abnormal gait, decreased activity tolerance, decreased balance, decreased knowledge of use of DME, decreased mobility, difficulty walking, decreased ROM, decreased strength, hypomobility, increased edema, impaired flexibility, obesity, and pain.   ACTIVITY LIMITATIONS: sitting, standing, squatting, stairs, transfers, bed mobility, and locomotion level  PARTICIPATION LIMITATIONS: driving, shopping, community activity, occupation, and yard work  PERSONAL FACTORS: Age, Fitness, Past/current experiences, and Social background are also affecting patient's functional outcome.   REHAB POTENTIAL: Excellent  CLINICAL DECISION MAKING: Stable/uncomplicated  EVALUATION COMPLEXITY: Low   GOALS: Goals reviewed with patient? Yes  SHORT TERM GOALS: Target date: 02/20/2022   Will be compliant with appropriate progressive HEP  Baseline: Goal status: INITIAL  2.  Pain to be no more than 3/10 at worst  Baseline:  Goal status: INITIAL  3.  Will be able to ambulate with SPC and normalized gait mechanics household distances no increase in pain  Baseline:  Goal status: INITIAL  4.  Knee extension AROM to be no more than 5*, flexion AROM to be no less than 90* Baseline:  Goal status: INITIAL    LONG TERM GOALS: Target date: 03/13/2022    MMT to have improved by at least 1 grade in all weak groups  Baseline:  Goal status: INITIAL  2.  Knee flexion  AROM to be no less than 105* Baseline:  Goal status: INITIAL  3.  Will be able to ambulate at least 105f with no device and good gait mechanics  Baseline:  Goal status: INITIAL  4.  Will be able to ascend/descend 15 steps with U rail, reciprocal pattern and no increase in pain  Baseline:  Goal status: INITIAL     PLAN:  PT FREQUENCY:  3x/week for first 3 weeks, then 2x/week for next 3 weeks (6 weeks total)  PT DURATION: 6 weeks  PLANNED INTERVENTIONS: Therapeutic exercises, Therapeutic activity, Neuromuscular re-education, Balance training, Gait training, Patient/Family education, Self Care, Joint mobilization, Stair training, DME instructions, Dry Needling, Electrical stimulation, Cryotherapy, Moist heat, Taping, Ultrasound, Ionotophoresis '4mg'$ /ml Dexamethasone, Manual therapy, and Re-evaluation  PLAN FOR NEXT SESSION: ROM and strength, gait training, manual therapy, vaso if desired.  BElsie Ra PT, DPT 02/02/22 4:01 PM

## 2022-02-06 ENCOUNTER — Encounter: Payer: Self-pay | Admitting: Physical Therapy

## 2022-02-06 ENCOUNTER — Ambulatory Visit: Payer: 59 | Admitting: Physical Therapy

## 2022-02-06 DIAGNOSIS — M25662 Stiffness of left knee, not elsewhere classified: Secondary | ICD-10-CM | POA: Diagnosis not present

## 2022-02-06 DIAGNOSIS — M25562 Pain in left knee: Secondary | ICD-10-CM | POA: Diagnosis not present

## 2022-02-06 DIAGNOSIS — M6281 Muscle weakness (generalized): Secondary | ICD-10-CM

## 2022-02-06 DIAGNOSIS — R262 Difficulty in walking, not elsewhere classified: Secondary | ICD-10-CM | POA: Diagnosis not present

## 2022-02-06 DIAGNOSIS — R600 Localized edema: Secondary | ICD-10-CM

## 2022-02-06 NOTE — Therapy (Signed)
OUTPATIENT PHYSICAL THERAPY LOWER EXTREMITY TREATMENT   Patient Name: Kayla Larsen MRN: [id number] DOB:19-Jul-1970, 52 y.o., female Today's Date: 02/06/2022  END OF SESSION:  PT End of Session - 02/06/22 1611     Visit Number 4    Number of Visits 16    Date for PT Re-Evaluation 03/13/22    Authorization Type [employer]    Authorization Time Period 01/30/22 to 03/27/22    PT Start Time 1517    PT Stop Time 1559    PT Time Calculation (min) 42 min    Activity Tolerance Patient tolerated treatment well    Behavior During Therapy [facility] for tasks assessed/performed               Past Medical History:  Diagnosis Date   Acanthosis nigricans    Anemia    Arthritis of knee    History of blood transfusion 2009   2 units after hysterectomy   Hypertension    Obesity    Wears glasses    Past Surgical History:  Procedure Laterality Date   CESAREAN SECTION     x 2   COLONOSCOPY  2009   done for anemia eval; Dr. Benson Norway   COLONOSCOPY  01/25/2018   multiple aphthous ulcers in terminal ileum likely d/t NSAIDs, diverticulosis in sigmoid colon, Dr. Kaka Cellar   KNEE SURGERY  10/2015   knot removed from R knee   LAPAROTOMY Left 07/30/2012   Procedure: LEFT SALPINGO OOPHERECTOMY,  OMENTECTOMY, AND LYSIS OF ADHESIONS;  Surgeon: Alvino Chapel, MD;  Location: [facility];  Service: Gynecology;  Laterality: Left;   OOPHORECTOMY  2014   left    TOTAL ABDOMINAL HYSTERECTOMY  2009   TOTAL KNEE ARTHROPLASTY Right 01/14/2021   Procedure: Right TOTAL KNEE ARTHROPLASTY;  Surgeon: [name], MD;  Location: [facility];  Service: Orthopedics;  Laterality: Right;  RNFA   TOTAL KNEE ARTHROPLASTY Left 01/13/2022   Procedure: LEFT TOTAL KNEE ARTHROPLASTY;  Surgeon: [name], MD;  Location: [facility];  Service: Orthopedics;  Laterality: Left;   Patient Active Problem List   Diagnosis Date Noted   Status post total left knee replacement 01/13/2022   BMI 40.0-44.9, adult  ([place]) 01/05/2022   Unilateral primary osteoarthritis, left knee 10/31/2021   Status post total right knee replacement 01/16/2021   Encounter for health maintenance examination in adult 12/27/2020   Screening for heart disease 12/27/2020   Iron deficiency 12/27/2020   Screening for diabetes mellitus 12/27/2020   BMI 38.0-38.9,adult 12/27/2020   Hypokalemia 12/27/2020   Primary osteoarthritis of right knee 11/16/2020   H/O: hysterectomy 09/21/2020   Vaccine counseling 11/20/2019   Encounter for screening mammogram for malignant neoplasm of breast 11/20/2019   Gout of foot 11/20/2019   Primary osteoarthritis of both knees 05/14/2019   Diverticulosis 11/19/2018   Vitamin D deficiency 11/24/2016   Overactive bladder 05/03/2015   Essential hypertension 05/03/2015   Arthritis, senescent 05/03/2015   Hypertriglyceridemia 05/03/2015   Impaired fasting blood sugar 05/03/2015    PCP: Jacklynn Barnacle   REFERRING PROVIDER: [name], MD  REFERRING DIAG: [phone] (ICD-10-CM) - Status post total left knee replacement  THERAPY DIAG:  Acute pain of left knee  Muscle weakness (generalized)  Stiffness of left knee, not elsewhere classified  Difficulty in walking, not elsewhere classified  Localized edema  Rationale for Evaluation and Treatment: Rehabilitation  ONSET DATE: 01/26/2022  SUBJECTIVE:   SUBJECTIVE STATEMENT:  Feeling stiff today because of the weather changes.  Nothing else going on.   PERTINENT HISTORY: The patient comes in today for first postoperative visit status post a left total knee arthroplasty.  We have replaced her right knee remotely.  She is ambulate with a walker.  She does report significant left foot and ankle swelling.  She has been wearing compressive garments but not regularly.  She has been compliant with a baby aspirin twice daily.   The staples are removed from the left knee incision and Steri-Strips have been placed.  Her calf is  soft.  Her foot and ankle is swollen but she can pump her foot back-and-forth and I want her to keep doing that as well as elevation and compressive garment to help with swelling.   I will send in some more pain medication for her.  We will get her set up for outpatient physical therapy hopefully upstairs here for further evaluation and treatment status post a left knee replacement.  From my standpoint I will see her back in 4 weeks for repeat exam but no x-rays are needed.  All questions and concerns were addressed and answered.   PAIN:  Are you having pain? Yes: NPRS scale: 5/10 Pain location: left knee  Pain description: squeezing   Aggravating factors: nothing  Relieving factors: nothing  PRECAUTIONS: None  WEIGHT BEARING RESTRICTIONS: No  FALLS:  Has patient fallen in last 6 months? 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 Ninfa Linden mid-February   OBJECTIVE:   DIAGNOSTIC FINDINGS:   PATIENT SURVEYS:  FOTO 53, predicted 73  COGNITION: Overall cognitive status: Within functional limits for tasks assessed     SENSATION: N/T around incision as appropriate post-op, no signs of drainage/erythema/infection at incision looking good   EDEMA:   As appropriate post-op  MUSCLE LENGTH:   POSTURE:   PALPATION:   LOWER EXTREMITY ROM:  Active ROM Right eval Left eval  Hip flexion    Hip extension    Hip abduction    Hip adduction    Hip internal rotation    Hip external rotation     Knee flexion  59* supine heel slide   Knee extension  12* supine with heel prop   Ankle dorsiflexion    Ankle plantarflexion    Ankle inversion    Ankle eversion     (Blank rows = not tested)  LOWER EXTREMITY MMT:  MMT Right eval Left eval  Hip flexion    Hip extension    Hip abduction    Hip adduction    Hip internal rotation    Hip external rotation    Knee flexion 4+ 4  Knee extension 5 3 limited by pain   Ankle dorsiflexion    Ankle plantarflexion    Ankle inversion    Ankle eversion     (Blank rows = not tested)  LOWER EXTREMITY SPECIAL TESTS:    FUNCTIONAL TESTS:  5 times sit to stand: 14.7 seconds use of BUEs  Timed up and go (TUG): 17.4 seconds RW  3MWT:   GAIT: Distance walked: 558f  Assistive device utilized: WEnvironmental consultant- 2 wheeled Level of assistance: Modified independence Comments: limited knee flexion B, flexed at hips    TODAY'S TREATMENT:                                                                                                                              DATE:   02/06/22  TherEx  Scifit bike seat 11 --> 10 x6 minutes full rotations  Self knee flexion stretch 10x10 second holds LAQs x15 2# 3 second holds  Tried quad sets unable to recruit quad well today SAQs 2# x20 3 second holds   Manual  Retrograde massage LE elevated Extensive tennis ball massage to lateral thigh     02/02/22  TherEx  Scifit bike seat 13-12 full rotations x8 minutes Leg press 50# DL 2X10, 25# Lt only 2X10, slow eccentrics into max tolerated flexion and extension with seat lock out LAQs 2# 2x10 sitting  Seated knee flexion AAROM stretch 5 sec X 15 Seated hamstring stretch on left 30 sec X 3 Seated SLR on left X10 Supine extension stretch first 2-3 minutes of vaso   Manual Left knee PROM flexion and extension with overpressure to tolerance in sitting  -Vasopnuematic device X 10 min, medium compression, 34 deg to Lt knee   02/01/22   TherEx    Scifit bike seat 12 full rotations x6 minutes, cues to not raise/hike hip Gastroc stretches L LE 3x30 seconds SAQs 0# 2x20 with 5 second holds LAQs 0# 2x10 sitting      Manual   [place] mobs all directions grade II-III  Retrograde massage LE elevated  Attempted knee extension OP- pt too resistive Knee flexion over pressure seated at edge of mat table        PATIENT EDUCATION:  Education details: exam findings, POC Person educated: Patient Education method: ECustomer service managerEducation comprehension: verbalized understanding  HOME EXERCISE PROGRAM: HHPT program  ASSESSMENT:  CLINICAL IMPRESSION:  [place], feeling very stiff- warmed up on the Scifit then worked on general strength today. Spent quite a bit of time on manual had really extensive mm spasms lateral thigh surgical LE but I was able to release quite a few with tennis ball IASTM massage. Education provided on getting up and moving for 5 minutes/half hour or 10 minutes/hour, self tennis ball massage, and use of heat pack on quad mm specifically (not across joint to prevent excessive edema). Will continue efforts.   OBJECTIVE IMPAIRMENTS: Abnormal gait, decreased activity tolerance, decreased balance, decreased knowledge of use of DME, decreased mobility, difficulty walking, decreased ROM, decreased strength, hypomobility, increased edema, impaired flexibility, obesity, and pain.   ACTIVITY LIMITATIONS: sitting, standing, squatting, stairs, transfers, bed mobility, and locomotion level  PARTICIPATION LIMITATIONS: driving, shopping, community activity, occupation, and yard work  PERSONAL FACTORS: Age, Fitness, Past/current experiences, and Social background are also affecting patient's functional outcome.   REHAB  POTENTIAL: Excellent  CLINICAL DECISION MAKING: Stable/uncomplicated  EVALUATION COMPLEXITY: Low   GOALS: Goals reviewed with patient? Yes  SHORT TERM GOALS: Target  date: 02/20/2022   Will be compliant with appropriate progressive HEP  Baseline: Goal status: INITIAL  2.  Pain to be no more than 3/10 at worst  Baseline:  Goal status: INITIAL  3.  Will be able to ambulate with SPC and normalized gait mechanics household distances no increase in pain  Baseline:  Goal status: INITIAL  4.  Knee extension AROM to be no more than 5*, flexion AROM to be no less than 90* Baseline:  Goal status: INITIAL    LONG TERM GOALS: Target date: 03/13/2022    MMT to have improved by at least 1 grade in all weak groups  Baseline:  Goal status: INITIAL  2.  Knee flexion AROM to be no less than 105* Baseline:  Goal status: INITIAL  3.  Will be able to ambulate at least 1058f with no device and good gait mechanics  Baseline:  Goal status: INITIAL  4.  Will be able to ascend/descend 15 steps with U rail, reciprocal pattern and no increase in pain  Baseline:  Goal status: INITIAL     PLAN:  PT FREQUENCY:  3x/week for first 3 weeks, then 2x/week for next 3 weeks (6 weeks total)  PT DURATION: 6 weeks  PLANNED INTERVENTIONS: Therapeutic exercises, Therapeutic activity, Neuromuscular re-education, Balance training, Gait training, Patient/Family education, Self Care, Joint mobilization, Stair training, DME instructions, Dry Needling, Electrical stimulation, Cryotherapy, Moist heat, Taping, Ultrasound, Ionotophoresis '4mg'$ /ml Dexamethasone, Manual therapy, and Re-evaluation  PLAN FOR NEXT SESSION: ROM and strength, gait training, manual therapy, vaso if desired.   KDeniece ReePT DPT PN2  02/06/2022, 4:11 PM

## 2022-02-08 ENCOUNTER — Ambulatory Visit: Payer: 59 | Admitting: Physical Therapy

## 2022-02-08 ENCOUNTER — Encounter: Payer: Self-pay | Admitting: Physical Therapy

## 2022-02-08 DIAGNOSIS — M25662 Stiffness of left knee, not elsewhere classified: Secondary | ICD-10-CM | POA: Diagnosis not present

## 2022-02-08 DIAGNOSIS — M25562 Pain in left knee: Secondary | ICD-10-CM | POA: Diagnosis not present

## 2022-02-08 DIAGNOSIS — M6281 Muscle weakness (generalized): Secondary | ICD-10-CM

## 2022-02-08 DIAGNOSIS — R262 Difficulty in walking, not elsewhere classified: Secondary | ICD-10-CM | POA: Diagnosis not present

## 2022-02-08 DIAGNOSIS — R600 Localized edema: Secondary | ICD-10-CM

## 2022-02-08 NOTE — Therapy (Signed)
OUTPATIENT PHYSICAL THERAPY LOWER EXTREMITY TREATMENT   Patient Name: Kayla Larsen MRN: [id number] DOB:August 16, 1970, 52 y.o., female Today's Date: 02/08/2022  END OF SESSION:  PT End of Session - 02/08/22 1358     Visit Number 5    Number of Visits 16    Date for PT Re-Evaluation 03/13/22    Authorization Type [employer]et[employer]    Authorization Time Period 01/30/22 to 03/27/22    PT Start Time 4098   pt arrived late   PT Stop Time 1435    PT Time Calculation (min) 39 min    Activity Tolerance Patient tolerated treatment well    Behavior During Therapy [facility] for tasks assessed/performed                Past Medical History:  Diagnosis Date   Acanthosis nigricans    Anemia    Arthritis of knee    History of blood transfusion 2009   2 units after hysterectomy   Hypertension    Obesity    Wears glasses    Past Surgical History:  Procedure Laterality Date   CESAREAN SECTION     x 2   COLONOSCOPY  2009   done for anemia eval; Dr. Benson Norway   COLONOSCOPY  01/25/2018   multiple aphthous ulcers in terminal ileum likely d/t NSAIDs, diverticulosis in sigmoid colon, Dr. Del Muerto Cellar   KNEE SURGERY  10/2015   knot removed from R knee   LAPAROTOMY Left 07/30/2012   Procedure: LEFT SALPINGO OOPHERECTOMY,  OMENTECTOMY, AND LYSIS OF ADHESIONS;  Surgeon: Alvino Chapel, MD;  Location: [facility];  Service: Gynecology;  Laterality: Left;   OOPHORECTOMY  2014   left    TOTAL ABDOMINAL HYSTERECTOMY  2009   TOTAL KNEE ARTHROPLASTY Right 01/14/2021   Procedure: Right TOTAL KNEE ARTHROPLASTY;  Surgeon: [name], MD;  Location: [facility];  Service: Orthopedics;  Laterality: Right;  RNFA   TOTAL KNEE ARTHROPLASTY Left 01/13/2022   Procedure: LEFT TOTAL KNEE ARTHROPLASTY;  Surgeon: [name], MD;  Location: [facility];  Service: Orthopedics;  Laterality: Left;   Patient Active Problem List   Diagnosis Date Noted   Status post total left knee replacement 01/13/2022   BMI  40.0-44.9, adult ([place]) 01/05/2022   Unilateral primary osteoarthritis, left knee 10/31/2021   Status post total right knee replacement 01/16/2021   Encounter for health maintenance examination in adult 12/27/2020   Screening for heart disease 12/27/2020   Iron deficiency 12/27/2020   Screening for diabetes mellitus 12/27/2020   BMI 38.0-38.9,adult 12/27/2020   Hypokalemia 12/27/2020   Primary osteoarthritis of right knee 11/16/2020   H/O: hysterectomy 09/21/2020   Vaccine counseling 11/20/2019   Encounter for screening mammogram for malignant neoplasm of breast 11/20/2019   Gout of foot 11/20/2019   Primary osteoarthritis of both knees 05/14/2019   Diverticulosis 11/19/2018   Vitamin D deficiency 11/24/2016   Overactive bladder 05/03/2015   Essential hypertension 05/03/2015   Arthritis, senescent 05/03/2015   Hypertriglyceridemia 05/03/2015   Impaired fasting blood sugar 05/03/2015    PCP: Jacklynn Barnacle   REFERRING PROVIDER: [name], MD  REFERRING DIAG: [phone] (ICD-10-CM) - Status post total left knee replacement  THERAPY DIAG:  Acute pain of left knee  Muscle weakness (generalized)  Stiffness of left knee, not elsewhere classified  Difficulty in walking, not elsewhere classified  Localized edema  Rationale for Evaluation and Treatment: Rehabilitation  ONSET DATE: 01/26/2022  SUBJECTIVE:   SUBJECTIVE STATEMENT: C/O stiffness only, otherwise  knee is doing pretty well   PERTINENT HISTORY: The patient comes in today for first postoperative visit status post a left total knee arthroplasty.  We have replaced her right knee remotely.  She is ambulate with a walker.  She does report significant left foot and ankle swelling.  She has been wearing compressive garments but not regularly.  She has been compliant with a baby aspirin twice daily.   The staples are removed from the left knee incision and Steri-Strips have been placed.  Her calf is soft.   Her foot and ankle is swollen but she can pump her foot back-and-forth and I want her to keep doing that as well as elevation and compressive garment to help with swelling.   I will send in some more pain medication for her.  We will get her set up for outpatient physical therapy hopefully upstairs here for further evaluation and treatment status post a left knee replacement.  From my standpoint I will see her back in 4 weeks for repeat exam but no x-rays are needed.  All questions and concerns were addressed and answered.   PAIN:  Are you having pain? Yes: NPRS scale: 6/10 Pain location: left knee  Pain description: squeezing   Aggravating factors: nothing  Relieving factors: nothing  PRECAUTIONS: None  WEIGHT BEARING RESTRICTIONS: No  FALLS:  Has patient fallen in last 6 months? 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 Ninfa Linden mid-February   OBJECTIVE:   DIAGNOSTIC FINDINGS:   PATIENT SURVEYS:  FOTO 53, predicted 4  COGNITION: Overall cognitive status: Within functional limits for tasks assessed     SENSATION: N/T around incision as appropriate post-op, no signs of drainage/erythema/infection at incision looking good   EDEMA:  As appropriate post-op  LOWER EXTREMITY ROM:  Active ROM Left eval Left 02/08/22  Knee flexion 59* supine heel slide  A: 87 supine P:91  Knee extension 12* supine with heel prop  -5  (Seated LAQ)   (Blank rows = not tested)  LOWER EXTREMITY  MMT:  MMT Right eval Left eval  Knee flexion 4+ 4  Knee extension 5 3 limited by pain    (Blank rows = not tested)  LOWER EXTREMITY SPECIAL TESTS:    FUNCTIONAL TESTS:  EVAL: 5 times sit to stand: 14.7 seconds use of BUEs  Timed up and go (TUG): 17.4 seconds RW  3MWT:   GAIT: Distance walked: 580f  Assistive device utilized: WEnvironmental consultant- 2 wheeled Level of assistance: Modified independence Comments: limited knee flexion B, flexed at hips    TODAY'S TREATMENT:  DATE:   02/08/22 TherEx Scifit bike seat 10 x 8 min; L2.0 Lt LAQ 3# 2x10; 5 sec hold Supine AA heel slide x 20 reps AROM as noted above Long sitting quad sets x 10 reps (visual cues, performed bil)  Gait Training Amb with SPC 100' with supervision, min cues for sequencing; no evidence of imbalance and feel pt safe for household amb and short community distances  Modalities Vaso x 10 min to Lt knee, mod pressure; 34 deg  02/06/22  TherEx  Scifit bike seat 11 --> 10 x6 minutes full rotations  Self knee flexion stretch 10x10 second holds LAQs x15 2# 3 second holds  Tried quad sets unable to recruit quad well today SAQs 2# x20 3 second holds   Manual  Retrograde massage LE elevated Extensive tennis ball massage to lateral thigh     02/02/22  TherEx  Scifit bike seat 13-12 full rotations x8 minutes Leg press 50# DL 2X10, 25# Lt only 2X10, slow eccentrics into max tolerated flexion and extension with seat lock out LAQs 2# 2x10 sitting  Seated knee flexion AAROM stretch 5 sec X 15 Seated hamstring stretch on left 30 sec X 3 Seated SLR on left X10 Supine extension stretch first 2-3 minutes of vaso   Manual Left knee PROM flexion and extension with overpressure to tolerance in sitting  -Vasopnuematic device X 10 min, medium compression, 34 deg to Lt knee   PATIENT EDUCATION:   Education details: exam findings, POC Person educated: Patient Education method: Customer service manager Education comprehension: verbalized understanding  HOME EXERCISE PROGRAM: HHPT program  ASSESSMENT:  CLINICAL IMPRESSION: Pt with good improvement in AROM this week from last week.  Steady progress with PT at this time.  Improved quad activation with visual cues and performing on Rt as well.   Will continue to benefit from PT to maximize function.  OBJECTIVE IMPAIRMENTS: Abnormal gait, decreased activity tolerance, decreased balance, decreased knowledge of use of DME, decreased mobility, difficulty walking, decreased ROM, decreased strength, hypomobility, increased edema, impaired flexibility, obesity, and pain.   ACTIVITY LIMITATIONS: sitting, standing, squatting, stairs, transfers, bed mobility, and locomotion level  PARTICIPATION LIMITATIONS: driving, shopping, community activity, occupation, and yard work  PERSONAL FACTORS: Age, Fitness, Past/current experiences, and Social background are also affecting patient's functional outcome.   REHAB POTENTIAL: Excellent  CLINICAL DECISION MAKING: Stable/uncomplicated  EVALUATION COMPLEXITY: Low   GOALS: Goals reviewed with patient? Yes  SHORT TERM GOALS: Target date: 02/20/2022   Will be compliant with appropriate progressive HEP  Baseline: Goal status: INITIAL  2.  Pain to be no more than 3/10 at worst  Baseline:  Goal status: INITIAL  3.  Will be able to ambulate with SPC and normalized gait mechanics household distances no increase in pain  Baseline:  Goal status: INITIAL  4.  Knee extension AROM to be no more than 5*, flexion AROM to be no less than 90* Baseline:  Goal status: INITIAL    LONG TERM GOALS: Target date: 03/13/2022    MMT to have improved by at least 1 grade in all weak groups  Baseline:  Goal status: INITIAL  2.  Knee flexion AROM to be no less than 105* Baseline:  Goal status:  INITIAL  3.  Will be able to ambulate at least 1068f with no device and good gait mechanics  Baseline:  Goal status: INITIAL  4.  Will be able to ascend/descend 15 steps with U rail, reciprocal pattern and no increase in pain  Baseline:  Goal status: INITIAL     PLAN:  PT FREQUENCY:  3x/week for first 3 weeks, then 2x/week for next 3 weeks (6 weeks total)  PT DURATION: 6 weeks  PLANNED INTERVENTIONS: Therapeutic exercises, Therapeutic activity, Neuromuscular re-education, Balance training, Gait training, Patient/Family education, Self Care, Joint mobilization, Stair training, DME instructions, Dry Needling, Electrical stimulation, Cryotherapy, Moist heat, Taping, Ultrasound, Ionotophoresis '4mg'$ /ml Dexamethasone, Manual therapy, and Re-evaluation  PLAN FOR NEXT SESSION:  ROM and strength, gait training with SPC PRN, manual therapy, vaso if desired.   Laureen Abrahams, PT, DPT 02/08/22 2:29 PM

## 2022-02-09 ENCOUNTER — Other Ambulatory Visit: Payer: Self-pay | Admitting: Orthopaedic Surgery

## 2022-02-09 ENCOUNTER — Other Ambulatory Visit (HOSPITAL_COMMUNITY): Payer: Self-pay

## 2022-02-09 ENCOUNTER — Encounter: Payer: Self-pay | Admitting: Physical Therapy

## 2022-02-09 ENCOUNTER — Ambulatory Visit: Payer: 59 | Admitting: Physical Therapy

## 2022-02-09 DIAGNOSIS — R262 Difficulty in walking, not elsewhere classified: Secondary | ICD-10-CM

## 2022-02-09 DIAGNOSIS — M6281 Muscle weakness (generalized): Secondary | ICD-10-CM

## 2022-02-09 DIAGNOSIS — M25662 Stiffness of left knee, not elsewhere classified: Secondary | ICD-10-CM | POA: Diagnosis not present

## 2022-02-09 DIAGNOSIS — R600 Localized edema: Secondary | ICD-10-CM

## 2022-02-09 DIAGNOSIS — M25562 Pain in left knee: Secondary | ICD-10-CM | POA: Diagnosis not present

## 2022-02-09 MED ORDER — OXYCODONE HCL 5 MG PO TABS: 5.0000 mg | ORAL_TABLET | ORAL | 0 refills | Status: DC | PRN

## 2022-02-09 MED ORDER — TIZANIDINE HCL 4 MG PO TABS: 4.0000 mg | ORAL_TABLET | Freq: Three times a day (TID) | ORAL | 0 refills | Status: DC | PRN

## 2022-02-09 NOTE — Therapy (Signed)
OUTPATIENT PHYSICAL THERAPY LOWER EXTREMITY TREATMENT   Patient Name: Kayla Larsen MRN: [id number] DOB:01-02-1971, 52 y.o., female Today's Date: 02/09/2022  END OF SESSION:  PT End of Session - 02/09/22 1338     Visit Number 6    Number of Visits 16    Date for PT Re-Evaluation 03/13/22    Authorization Type [employer]    Authorization Time Period 01/30/22 to 03/27/22    PT Start Time 1338    PT Stop Time 1426    PT Time Calculation (min) 48 min    Activity Tolerance Patient tolerated treatment well    Behavior During Therapy [facility] for tasks assessed/performed                 Past Medical History:  Diagnosis Date   Acanthosis nigricans    Anemia    Arthritis of knee    History of blood transfusion 2009   2 units after hysterectomy   Hypertension    Obesity    Wears glasses    Past Surgical History:  Procedure Laterality Date   CESAREAN SECTION     x 2   COLONOSCOPY  2009   done for anemia eval; Dr. Benson [place]   COLONOSCOPY  01/25/2018   multiple aphthous ulcers in terminal ileum likely d/t NSAIDs, diverticulosis in sigmoid colon, Dr. [place] Cellar   KNEE SURGERY  10/2015   knot removed from R knee   LAPAROTOMY Left 07/30/2012   Procedure: LEFT SALPINGO OOPHERECTOMY,  OMENTECTOMY, AND LYSIS OF ADHESIONS;  Surgeon: Alvino Chapel, MD;  Location: [facility];  Service: Gynecology;  Laterality: Left;   OOPHORECTOMY  2014   left    TOTAL ABDOMINAL HYSTERECTOMY  2009   TOTAL KNEE ARTHROPLASTY Right 01/14/2021   Procedure: Right TOTAL KNEE ARTHROPLASTY;  Surgeon: [name], MD;  Location: [facility];  Service: Orthopedics;  Laterality: Right;  RNFA   TOTAL KNEE ARTHROPLASTY Left 01/13/2022   Procedure: LEFT TOTAL KNEE ARTHROPLASTY;  Surgeon: [name], MD;  Location: [facility];  Service: Orthopedics;  Laterality: Left;   Patient Active Problem List   Diagnosis Date Noted   Status post total left knee replacement 01/13/2022   BMI 40.0-44.9, adult  ([place]) 01/05/2022   Unilateral primary osteoarthritis, left knee 10/31/2021   Status post total right knee replacement 01/16/2021   Encounter for health maintenance examination in adult 12/27/2020   Screening for heart disease 12/27/2020   Iron deficiency 12/27/2020   Screening for diabetes mellitus 12/27/2020   BMI 38.0-38.9,adult 12/27/2020   Hypokalemia 12/27/2020   Primary osteoarthritis of right knee 11/16/2020   H/O: hysterectomy 09/21/2020   Vaccine counseling 11/20/2019   Encounter for screening mammogram for malignant neoplasm of breast 11/20/2019   Gout of foot 11/20/2019   Primary osteoarthritis of both knees 05/14/2019   Diverticulosis 11/19/2018   Vitamin D deficiency 11/24/2016   Overactive bladder 05/03/2015   Essential hypertension 05/03/2015   Arthritis, senescent 05/03/2015   Hypertriglyceridemia 05/03/2015   Impaired fasting blood sugar 05/03/2015    PCP: Jacklynn Barnacle   REFERRING PROVIDER: [name], MD  REFERRING DIAG: [phone] (ICD-10-CM) - Status post total left knee replacement  THERAPY DIAG:  Acute pain of left knee  Muscle weakness (generalized)  Stiffness of left knee, not elsewhere classified  Difficulty in walking, not elsewhere classified  Localized edema  Rationale for Evaluation and Treatment: Rehabilitation  ONSET DATE: 01/26/2022  SUBJECTIVE:   SUBJECTIVE STATEMENT: moved around more yesterday and pain is  still about the same. Feels she's doing well overall  PERTINENT HISTORY: The patient comes in today for first postoperative visit status post a left total knee arthroplasty.  We have replaced her right knee remotely.  She is ambulate with a walker.  She does report significant left foot and ankle swelling.  She has been wearing compressive garments but not regularly.  She has been compliant with a baby aspirin twice daily.   The staples are removed from the left knee incision and Steri-Strips have been placed.   Her calf is soft.  Her foot and ankle is swollen but she can pump her foot back-and-forth and I want her to keep doing that as well as elevation and compressive garment to help with swelling.   I will send in some more pain medication for her.  We will get her set up for outpatient physical therapy hopefully upstairs here for further evaluation and treatment status post a left knee replacement.  From my standpoint I will see her back in 4 weeks for repeat exam but no x-rays are needed.  All questions and concerns were addressed and answered.   PAIN:  Are you having pain? Yes: NPRS scale: 6/10 Pain location: left knee  Pain description: squeezing   Aggravating factors: nothing  Relieving factors: nothing  PRECAUTIONS: None  WEIGHT BEARING RESTRICTIONS: No  FALLS:  Has patient fallen in last 6 months? 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: Independent, [place] with basic ADLs, Independent with gait, and Independent with transfers  PATIENT GOALS: be able to walk better, bend both knees   NEXT MD VISIT: Dr. Ninfa Linden mid-February   OBJECTIVE:  PATIENT SURVEYS:  EVAL: FOTO 53, predicted 62  SENSATION: N/T around incision as appropriate post-op, no signs of drainage/erythema/infection at incision looking good   EDEMA:  As appropriate post-op  LOWER EXTREMITY ROM:  Active ROM Left eval Left 02/08/22  Knee flexion 59* supine heel slide  A: 87 supine P:91  Knee extension 12* supine with heel prop  -5  (Seated LAQ)   (Blank rows = not tested)  LOWER EXTREMITY MMT:  MMT Right eval Left eval  Knee flexion 4+ 4  Knee extension 5 3 limited by pain     (Blank rows = not tested)  LOWER EXTREMITY SPECIAL TESTS:    FUNCTIONAL TESTS:  EVAL: 5 times sit to stand: 14.7 seconds use of BUEs  Timed up and go (TUG): 17.4 seconds RW  3MWT:   GAIT: Distance walked: 522f  Assistive device utilized: WEnvironmental consultant- 2 wheeled Level of assistance: Modified independence Comments: limited knee flexion B, flexed at hips    TODAY'S TREATMENT:  DATE:  02/09/22 TherEx Scifit bike seat 10 x 8 min; L2.0 Leg Press bil 100# 3x10; single leg 37# 3x10 bil Seated AA Lt knee flexion 10 x 10 sec holds; Rt knee providing AA Supine AA Lt heel slides x 10 reps (95 deg AA measurement)  Manual Seated Lt knee flexion PROM to tolerance  02/08/22 TherEx Scifit bike seat 10 x 8 min; L2.0 Lt LAQ 3# 2x10; 5 sec hold Supine AA heel slide x 20 reps AROM as noted above Long sitting quad sets x 10 reps (visual cues, performed bil)  Gait Training Amb with SPC 100' with supervision, min cues for sequencing; no evidence of imbalance and feel pt safe for household amb and short community distances  Modalities Vaso x 10 min to Lt knee, mod pressure; 34 deg  02/06/22  TherEx  Scifit bike seat 11 --> 10 x6 minutes full rotations  Self knee flexion stretch 10x10 second holds LAQs x15 2# 3 second holds  Tried quad sets unable to recruit quad well today SAQs 2# x20 3 second holds   Manual  Retrograde massage LE elevated Extensive tennis ball massage to lateral thigh     02/02/22  TherEx  Scifit bike seat 13-12 full rotations x8 minutes Leg press 50# DL 2X10, 25# Lt only 2X10, slow eccentrics into max tolerated flexion and extension with seat lock out LAQs 2# 2x10 sitting  Seated knee flexion AAROM stretch 5 sec X 15 Seated hamstring stretch on left 30 sec X 3 Seated SLR on left X10 Supine extension stretch first 2-3 minutes of  vaso   Manual Left knee PROM flexion and extension with overpressure to tolerance in sitting  -Vasopnuematic device X 10 min, medium compression, 34 deg to Lt knee   PATIENT EDUCATION:  Education details: exam findings, POC Person educated: Patient Education method: Customer service manager Education comprehension: verbalized understanding  HOME EXERCISE PROGRAM: HHPT program  ASSESSMENT:  CLINICAL IMPRESSION: Pt tolerated session well today arriving with [facility] and amb safely in the clinic.  Progressing well with PT at this time.  Will continue to benefit from PT to maximize function.  OBJECTIVE IMPAIRMENTS: Abnormal gait, decreased activity tolerance, decreased balance, decreased knowledge of use of DME, decreased mobility, difficulty walking, decreased ROM, decreased strength, hypomobility, increased edema, impaired flexibility, obesity, and pain.   ACTIVITY LIMITATIONS: sitting, standing, squatting, stairs, transfers, bed mobility, and locomotion level  PARTICIPATION LIMITATIONS: driving, shopping, community activity, occupation, and yard work  PERSONAL FACTORS: Age, Fitness, Past/current experiences, and Social background are also affecting patient's functional outcome.   REHAB POTENTIAL: Excellent  CLINICAL DECISION MAKING: Stable/uncomplicated  EVALUATION COMPLEXITY: Low   GOALS: Goals reviewed with patient? Yes  SHORT TERM GOALS: Target date: 02/20/2022   Will be compliant with appropriate progressive HEP  Baseline: Goal status: INITIAL  2.  Pain to be no more than 3/10 at worst  Baseline:  Goal status: INITIAL  3.  Will be able to ambulate with SPC and normalized gait mechanics household distances no increase in pain  Baseline:  Goal status: INITIAL  4.  Knee extension AROM to be no more than 5*, flexion AROM to be no less than 90* Baseline:  Goal status: INITIAL    LONG TERM GOALS: Target date: 03/13/2022    MMT to have improved by at least 1  grade in all weak groups  Baseline:  Goal status: INITIAL  2.  Knee flexion AROM to be no less than 105* Baseline:  Goal status: INITIAL  3.  Will be able to ambulate at least 1080f with no device and good gait mechanics  Baseline:  Goal status: INITIAL  4.  Will be able to ascend/descend 15 steps with U rail, reciprocal pattern and no increase in pain  Baseline:  Goal status: INITIAL     PLAN:  PT FREQUENCY:  3x/week for first 3 weeks, then 2x/week for next 3 weeks (6 weeks total)  PT DURATION: 6 weeks  PLANNED INTERVENTIONS: Therapeutic exercises, Therapeutic activity, Neuromuscular re-education, Balance training, Gait training, Patient/Family education, Self Care, Joint mobilization, Stair training, DME instructions, Dry Needling, Electrical stimulation, Cryotherapy, Moist heat, Taping, Ultrasound, Ionotophoresis '4mg'$ /ml Dexamethasone, Manual therapy, and Re-evaluation  PLAN FOR NEXT SESSION:  ROM and strength, gait training PRN without device, manual therapy, vaso if desired.   SLaureen Abrahams PT, DPT 02/09/22 2:18 PM

## 2022-02-10 ENCOUNTER — Other Ambulatory Visit: Payer: Self-pay | Admitting: Physician Assistant

## 2022-02-10 ENCOUNTER — Other Ambulatory Visit: Payer: Self-pay | Admitting: Podiatry

## 2022-02-13 ENCOUNTER — Ambulatory Visit: Payer: 59 | Admitting: Physical Therapy

## 2022-02-13 DIAGNOSIS — M6281 Muscle weakness (generalized): Secondary | ICD-10-CM

## 2022-02-13 DIAGNOSIS — R262 Difficulty in walking, not elsewhere classified: Secondary | ICD-10-CM | POA: Diagnosis not present

## 2022-02-13 DIAGNOSIS — M25562 Pain in left knee: Secondary | ICD-10-CM | POA: Diagnosis not present

## 2022-02-13 DIAGNOSIS — M25662 Stiffness of left knee, not elsewhere classified: Secondary | ICD-10-CM

## 2022-02-13 DIAGNOSIS — R600 Localized edema: Secondary | ICD-10-CM

## 2022-02-13 NOTE — Therapy (Signed)
OUTPATIENT PHYSICAL THERAPY LOWER EXTREMITY TREATMENT   Patient Name: Kayla Larsen MRN: [id number] DOB:[birth year], 52 y.o., female Today's Date: 02/13/2022  END OF SESSION:  PT End of Session - 02/13/22 1427     Visit Number 7    Number of Visits 16    Date for PT Re-Evaluation 03/13/22    Authorization Type [employer]et[employer]    Authorization Time Period 01/30/22 to 03/27/22    PT Start Time 1427    PT Stop Time 1515    PT Time Calculation (min) 48 min    Activity Tolerance Patient tolerated treatment well    Behavior During Therapy [facility] for tasks assessed/performed                  Past Medical History:  Diagnosis Date   Acanthosis nigricans    Anemia    Arthritis of knee    History of blood transfusion 2009   2 units after hysterectomy   Hypertension    Obesity    Wears glasses    Past Surgical History:  Procedure Laterality Date   CESAREAN SECTION     x 2   COLONOSCOPY  2009   done for anemia eval; Dr. Benson Norway   COLONOSCOPY  01/25/2018   multiple aphthous ulcers in terminal ileum likely d/t NSAIDs, diverticulosis in sigmoid colon, Dr. [place] Cellar   KNEE SURGERY  10/2015   knot removed from R knee   LAPAROTOMY Left 07/30/2012   Procedure: LEFT SALPINGO OOPHERECTOMY,  OMENTECTOMY, AND LYSIS OF ADHESIONS;  Surgeon: Alvino Chapel, MD;  Location: [facility];  Service: Gynecology;  Laterality: Left;   OOPHORECTOMY  2014   left    TOTAL ABDOMINAL HYSTERECTOMY  2009   TOTAL KNEE ARTHROPLASTY Right 01/14/2021   Procedure: Right TOTAL KNEE ARTHROPLASTY;  Surgeon: [name], MD;  Location: [facility];  Service: Orthopedics;  Laterality: Right;  RNFA   TOTAL KNEE ARTHROPLASTY Left 01/13/2022   Procedure: LEFT TOTAL KNEE ARTHROPLASTY;  Surgeon: [name], MD;  Location[facility];  Service: Orthopedics;  Laterality: Left;   Patient Active Problem List   Diagnosis Date Noted   Status post total left knee replacement 01/13/2022   BMI 40.0-44.9,  adult ([place]) 01/05/2022   Unilateral primary osteoarthritis, left knee 10/31/2021   Status post total right knee replacement 01/16/2021   Encounter for health maintenance examination in adult 12/27/2020   Screening for heart disease 12/27/2020   Iron deficiency 12/27/2020   Screening for diabetes mellitus 12/27/2020   BMI 38.0-38.9,adult 12/27/2020   Hypokalemia 12/27/2020   Primary osteoarthritis of right knee 11/16/2020   H/O: hysterectomy 09/21/2020   Vaccine counseling 11/20/2019   Encounter for screening mammogram for malignant neoplasm of breast 11/20/2019   Gout of foot 11/20/2019   Primary osteoarthritis of both knees 05/14/2019   Diverticulosis 11/19/2018   Vitamin D deficiency 11/24/2016   Overactive bladder 05/03/2015   Essential hypertension 05/03/2015   Arthritis, senescent 05/03/2015   Hypertriglyceridemia 05/03/2015   Impaired fasting blood sugar 05/03/2015    PCP: Jacklynn Barnacle   REFERRING PROVIDER: [name], MD  REFERRING DIAG: [phone] (ICD-10-CM) - Status post total left knee replacement  THERAPY DIAG:  Acute pain of left knee  Muscle weakness (generalized)  Stiffness of left knee, not elsewhere classified  Difficulty in walking, not elsewhere classified  Localized edema  Rationale for Evaluation and Treatment: Rehabilitation  ONSET DATE: 01/26/2022  SUBJECTIVE:   SUBJECTIVE STATEMENT: Knee feels tight today  PERTINENT  HISTORY: The patient comes in today for first postoperative visit status post a left total knee arthroplasty.  We have replaced her right knee remotely.  She is ambulate with a walker.  She does report significant left foot and ankle swelling.  She has been wearing compressive garments but not regularly.  She has been compliant with a baby aspirin twice daily.   The staples are removed from the left knee incision and Steri-Strips have been placed.  Her calf is soft.  Her foot and ankle is swollen but she can  pump her foot back-and-forth and I want her to keep doing that as well as elevation and compressive garment to help with swelling.   I will send in some more pain medication for her.  We will get her set up for outpatient physical therapy hopefully upstairs here for further evaluation and treatment status post a left knee replacement.  From my standpoint I will see her back in 4 weeks for repeat exam but no x-rays are needed.  All questions and concerns were addressed and answered.   PAIN:  Are you having pain? Yes: NPRS scale: 4-5/10 Pain location: left knee  Pain description: squeezing   Aggravating factors: nothing  Relieving factors: nothing  PRECAUTIONS: None  WEIGHT BEARING RESTRICTIONS: No  FALLS:  Has patient fallen in last 6 months? 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 Ninfa Linden mid-February   OBJECTIVE:  PATIENT SURVEYS:  EVAL: FOTO 53, predicted 12  SENSATION: N/T around incision as appropriate post-op, no signs of drainage/erythema/infection at incision looking good   EDEMA:  As appropriate post-op  LOWER EXTREMITY ROM:  Active ROM Left eval Left 02/08/22 Left 02/13/22  Knee flexion 59* supine heel slide  A: 87 supine P:91 A: 93 AA: 97  Knee extension 12* supine with heel prop  -5  (Seated LAQ)    (Blank rows = not tested)  LOWER EXTREMITY MMT:  MMT Right eval Left eval  Knee flexion 4+ 4  Knee extension 5 3 limited by pain    (Blank rows = not tested)  LOWER  EXTREMITY SPECIAL TESTS:    FUNCTIONAL TESTS:  EVAL: 5 times sit to stand: 14.7 seconds use of BUEs  Timed up and go (TUG): 17.4 seconds RW  3MWT:   GAIT: Distance walked: 572f  Assistive device utilized: WEnvironmental consultant- 2 wheeled Level of assistance: Modified independence Comments: limited knee flexion B, flexed at hips    TODAY'S TREATMENT:  DATE:  02/13/22 TherEx Scifit bike seat 10 x 8 min; L2.0 Leg Press bil 100# 3x10; single leg 50# 3x10 LLE LLE knee extension on cable 5# 3x10; LLE knee flexion 15# 3x10 AA knee flexion x 30 reps on Lt  Modalities Vaso x 10 min to Lt knee, mod pressure; 34 deg  02/09/22 TherEx Scifit bike seat 10 x 8 min; L2.0 Leg Press bil 100# 3x10; single leg 37# 3x10 bil Seated AA Lt knee flexion 10 x 10 sec holds; Rt knee providing AA Supine AA Lt heel slides x 10 reps (95 deg AA measurement)  Manual Seated Lt knee flexion PROM to tolerance  02/08/22 TherEx Scifit bike seat 10 x 8 min; L2.0 Lt LAQ 3# 2x10; 5 sec hold Supine AA heel slide x 20 reps AROM as noted above Long sitting quad sets x 10 reps (visual cues, performed bil)  Gait Training Amb with SPC 100' with supervision, min cues for sequencing; no evidence of imbalance and feel pt safe for household amb and short community distances  Modalities Vaso x 10 min to Lt knee, mod pressure; 34 deg  02/06/22  TherEx  Scifit bike seat 11 --> 10 x6 minutes full rotations  Self knee flexion stretch 10x10 second holds LAQs x15 2# 3 second holds  Tried quad sets unable to recruit quad well today SAQs 2# x20 3 second holds   Manual  Retrograde massage LE elevated Extensive tennis ball massage to lateral thigh     02/02/22  TherEx  Scifit bike seat 13-12 full rotations x8 minutes Leg press 50# DL 2X10, 25# Lt only 2X10, slow eccentrics into max tolerated flexion  and extension with seat lock out LAQs 2# 2x10 sitting  Seated knee flexion AAROM stretch 5 sec X 15 Seated hamstring stretch on left 30 sec X 3 Seated SLR on left X10 Supine extension stretch first 2-3 minutes of vaso   Manual Left knee PROM flexion and extension with overpressure to tolerance in sitting  -Vasopnuematic device X 10 min, medium compression, 34 deg to Lt knee   PATIENT EDUCATION:  Education details: exam findings, POC Person educated: Patient Education method: Customer service manager Education comprehension: verbalized understanding  HOME EXERCISE PROGRAM: HHPT program  ASSESSMENT:  CLINICAL IMPRESSION: Steady improvement in AROM noted today.  Progressing with PT at this time.  OBJECTIVE IMPAIRMENTS: Abnormal gait, decreased activity tolerance, decreased balance, decreased knowledge of use of DME, decreased mobility, difficulty walking, decreased ROM, decreased strength, hypomobility, increased edema, impaired flexibility, obesity, and pain.   ACTIVITY LIMITATIONS: sitting, standing, squatting, stairs, transfers, bed mobility, and locomotion level  PARTICIPATION LIMITATIONS: driving, shopping, community activity, occupation, and yard work  PERSONAL FACTORS: Age, Fitness, Past/current experiences, and Social background are also affecting patient's functional outcome.   REHAB POTENTIAL: Excellent  CLINICAL DECISION MAKING: Stable/uncomplicated  EVALUATION COMPLEXITY: Low   GOALS: Goals reviewed with patient? Yes  SHORT TERM GOALS: Target date: 02/20/2022   Will be compliant with appropriate progressive HEP  Baseline: Goal status: INITIAL  2.  Pain to be no more than 3/10 at worst  Baseline:  Goal status: INITIAL  3.  Will be able to ambulate with SPC and normalized gait mechanics household distances no increase in pain  Baseline:  Goal status: INITIAL  4.  Knee extension AROM to be no more than 5*, flexion AROM to be no less than  90* Baseline:  Goal status: INITIAL    LONG TERM GOALS: Target date: 03/13/2022    MMT to  have improved by at least 1 grade in all weak groups  Baseline:  Goal status: INITIAL  2.  Knee flexion AROM to be no less than 105* Baseline:  Goal status: INITIAL  3.  Will be able to ambulate at least 1059f with no device and good gait mechanics  Baseline:  Goal status: INITIAL  4.  Will be able to ascend/descend 15 steps with U rail, reciprocal pattern and no increase in pain  Baseline:  Goal status: INITIAL     PLAN:  PT FREQUENCY:  3x/week for first 3 weeks, then 2x/week for next 3 weeks (6 weeks total)  PT DURATION: 6 weeks  PLANNED INTERVENTIONS: Therapeutic exercises, Therapeutic activity, Neuromuscular re-education, Balance training, Gait training, Patient/Family education, Self Care, Joint mobilization, Stair training, DME instructions, Dry Needling, Electrical stimulation, Cryotherapy, Moist heat, Taping, Ultrasound, Ionotophoresis '4mg'$ /ml Dexamethasone, Manual therapy, and Re-evaluation  PLAN FOR NEXT SESSION:  ROM and strength, gait training PRN without device, manual therapy, vaso if desired.   SLaureen Abrahams PT, DPT 02/13/22 3:12 PM

## 2022-02-15 ENCOUNTER — Encounter: Payer: Self-pay | Admitting: Physical Therapy

## 2022-02-15 ENCOUNTER — Other Ambulatory Visit: Payer: Self-pay | Admitting: Orthopaedic Surgery

## 2022-02-15 ENCOUNTER — Ambulatory Visit (INDEPENDENT_AMBULATORY_CARE_PROVIDER_SITE_OTHER): Payer: 59 | Admitting: Physical Therapy

## 2022-02-15 DIAGNOSIS — M25562 Pain in left knee: Secondary | ICD-10-CM | POA: Diagnosis not present

## 2022-02-15 DIAGNOSIS — M25662 Stiffness of left knee, not elsewhere classified: Secondary | ICD-10-CM | POA: Diagnosis not present

## 2022-02-15 DIAGNOSIS — R262 Difficulty in walking, not elsewhere classified: Secondary | ICD-10-CM

## 2022-02-15 DIAGNOSIS — M6281 Muscle weakness (generalized): Secondary | ICD-10-CM

## 2022-02-15 DIAGNOSIS — R600 Localized edema: Secondary | ICD-10-CM

## 2022-02-15 NOTE — Therapy (Signed)
OUTPATIENT PHYSICAL THERAPY LOWER EXTREMITY TREATMENT   Patient Name: Kayla Larsen MRN: [id number] DOB:01-24-1970, 52 y.o., female Today's Date: 02/15/2022  END OF SESSION:  PT End of Session - 02/15/22 1538     Visit Number 8    Number of Visits 16    Date for PT Re-Evaluation 03/13/22    Authorization Type [employer]    Authorization Time Period 01/30/22 to 03/27/22    PT Start Time 1515    PT Stop Time 1555    PT Time Calculation (min) 40 min    Activity Tolerance Patient tolerated treatment well    Behavior During Therapy [facility] for tasks assessed/performed                   Past Medical History:  Diagnosis Date   Acanthosis nigricans    Anemia    Arthritis of knee    History of blood transfusion 2009   2 units after hysterectomy   Hypertension    Obesity    Wears glasses    Past Surgical History:  Procedure Laterality Date   CESAREAN SECTION     x 2   COLONOSCOPY  2009   done for anemia eval; Dr. Benson Norway   COLONOSCOPY  01/25/2018   multiple aphthous ulcers in terminal ileum likely d/t NSAIDs, diverticulosis in sigmoid colon, Dr. Lincolnton Cellar   KNEE SURGERY  10/2015   knot removed from R knee   LAPAROTOMY Left 07/30/2012   Procedure: LEFT SALPINGO OOPHERECTOMY,  OMENTECTOMY, AND LYSIS OF ADHESIONS;  Surgeon: Alvino Chapel, MD;  Location: [facility];  Service: Gynecology;  Laterality: Left;   OOPHORECTOMY  2014   left    TOTAL ABDOMINAL HYSTERECTOMY  2009   TOTAL KNEE ARTHROPLASTY Right 01/14/2021   Procedure: Right TOTAL KNEE ARTHROPLASTY;  Surgeon: [name], MD;  Location: [facility];  Service: Orthopedics;  Laterality: Right;  RNFA   TOTAL KNEE ARTHROPLASTY Left 01/13/2022   Procedure: LEFT TOTAL KNEE ARTHROPLASTY;  Surgeon: [name], MD;  Location: [facility];  Service: Orthopedics;  Laterality: Left;   Patient Active Problem List   Diagnosis Date Noted   Status post total left knee replacement 01/13/2022   BMI 40.0-44.9,  adult ([place]) 01/05/2022   Unilateral primary osteoarthritis, left knee 10/31/2021   Status post total right knee replacement 01/16/2021   Encounter for health maintenance examination in adult 12/27/2020   Screening for heart disease 12/27/2020   Iron deficiency 12/27/2020   Screening for diabetes mellitus 12/27/2020   BMI 38.0-38.9,adult 12/27/2020   Hypokalemia 12/27/2020   Primary osteoarthritis of right knee 11/16/2020   H/O: hysterectomy 09/21/2020   Vaccine counseling 11/20/2019   Encounter for screening mammogram for malignant neoplasm of breast 11/20/2019   Gout of foot 11/20/2019   Primary osteoarthritis of both knees 05/14/2019   Diverticulosis 11/19/2018   Vitamin D deficiency 11/24/2016   Overactive bladder 05/03/2015   Essential hypertension 05/03/2015   Arthritis, senescent 05/03/2015   Hypertriglyceridemia 05/03/2015   Impaired fasting blood sugar 05/03/2015    PCP: Jacklynn Barnacle   REFERRING PROVIDER: [name], MD  REFERRING DIAG: [phone] (ICD-10-CM) - Status post total left knee replacement  THERAPY DIAG:  Acute pain of left knee  Muscle weakness (generalized)  Stiffness of left knee, not elsewhere classified  Difficulty in walking, not elsewhere classified  Localized edema  Rationale for Evaluation and Treatment: Rehabilitation  ONSET DATE: 01/26/2022  SUBJECTIVE:   SUBJECTIVE STATEMENT:  Feeling good today, things  are coming along. Still want to walk like a duck. Still swollen.  PERTINENT HISTORY: The patient comes in today for first postoperative visit status post a left total knee arthroplasty.  We have replaced her right knee remotely.  She is ambulate with a walker.  She does report significant left foot and ankle swelling.  She has been wearing compressive garments but not regularly.  She has been compliant with a baby aspirin twice daily.   The staples are removed from the left knee incision and Steri-Strips have been  placed.  Her calf is soft.  Her foot and ankle is swollen but she can pump her foot back-and-forth and I want her to keep doing that as well as elevation and compressive garment to help with swelling.   I will send in some more pain medication for her.  We will get her set up for outpatient physical therapy hopefully upstairs here for further evaluation and treatment status post a left knee replacement.  From my standpoint I will see her back in 4 weeks for repeat exam but no x-rays are needed.  All questions and concerns were addressed and answered.   PAIN:  Are you having pain? Yes: NPRS scale: 4/10 Pain location: left knee  Pain description: squeezing   Aggravating factors: nothing  Relieving factors: ice  PRECAUTIONS: None  WEIGHT BEARING RESTRICTIONS: No  FALLS:  Has patient fallen in last 6 months? 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 Ninfa Linden mid-February   OBJECTIVE:  PATIENT SURVEYS:  EVAL: FOTO 53, predicted 58  SENSATION: N/T around incision as appropriate post-op, no signs of drainage/erythema/infection at incision looking good   EDEMA:  As appropriate post-op  LOWER EXTREMITY ROM:  Active ROM Left eval Left 02/08/22 Left 02/13/22  Knee flexion 59* supine heel slide  A: 87 supine P:91 A: 93 AA: 97  Knee extension 12* supine with heel prop  -5  (Seated LAQ)    (Blank rows = not tested)  LOWER EXTREMITY MMT:  MMT Right eval Left eval  Knee flexion 4+ 4  Knee  extension 5 3 limited by pain    (Blank rows = not tested)  LOWER EXTREMITY SPECIAL TESTS:    FUNCTIONAL TESTS:  EVAL: 5 times sit to stand: 14.7 seconds use of BUEs  Timed up and go (TUG): 17.4 seconds RW  3MWT:   GAIT: Distance walked: 543f  Assistive device utilized: WEnvironmental consultant- 2 wheeled Level of assistance: Modified independence Comments: limited knee flexion B, flexed at hips    TODAY'S TREATMENT:  DATE:   02/15/22  Measured LE lengths per pt request today- from midline patella to top of medial malleolus 41.5cm RLE, 41.25cm L LE consistent with observations with gait, discussed use of heel lift   TherEx  Nustep for flexion stretch L4 X6 minutes (bikes unavailable) LAQs 7# x10 with 3 second lifts Standing TKEs no resistance x20 3 second holds did well isolating quad Standing HS curls 0# x15 Standing hip hikes x12 B Gait training in clinic distances no device, cues for heel toe pattern and knee flexion  MET for quad + immediate knee flexion AAROM at edge of mat table     02/13/22 TherEx Scifit bike seat 10 x 8 min; L2.0 Leg Press bil 100# 3x10; single leg 50# 3x10 LLE LLE knee extension on cable 5# 3x10; LLE knee flexion 15# 3x10 AA knee flexion x 30 reps on Lt  Modalities Vaso x 10 min to Lt knee, mod pressure; 34 deg  02/09/22 TherEx Scifit bike seat 10 x 8 min; L2.0 Leg Press bil 100# 3x10; single leg 37# 3x10 bil Seated AA Lt knee flexion 10 x 10 sec holds; Rt knee providing AA Supine AA Lt heel slides x 10 reps (95 deg AA measurement)  Manual Seated Lt knee flexion PROM to tolerance  02/08/22 TherEx Scifit bike seat 10 x 8 min; L2.0 Lt LAQ 3# 2x10; 5 sec hold Supine AA heel slide x 20 reps AROM as noted above Long sitting quad sets x 10 reps (visual cues, performed bil)  Gait Training Amb with SPC 100' with supervision, min cues  for sequencing; no evidence of imbalance and feel pt safe for household amb and short community distances  Modalities Vaso x 10 min to Lt knee, mod pressure; 34 deg  02/06/22  TherEx  Scifit bike seat 11 --> 10 x6 minutes full rotations  Self knee flexion stretch 10x10 second holds LAQs x15 2# 3 second holds  Tried quad sets unable to recruit quad well today SAQs 2# x20 3 second holds   Manual  Retrograde massage LE elevated Extensive tennis ball massage to lateral thigh     02/02/22  TherEx  Scifit bike seat 13-12 full rotations x8 minutes Leg press 50# DL 2X10, 25# Lt only 2X10, slow eccentrics into max tolerated flexion and extension with seat lock out LAQs 2# 2x10 sitting  Seated knee flexion AAROM stretch 5 sec X 15 Seated hamstring stretch on left 30 sec X 3 Seated SLR on left X10 Supine extension stretch first 2-3 minutes of vaso   Manual Left knee PROM flexion and extension with overpressure to tolerance in sitting  -Vasopnuematic device X 10 min, medium compression, 34 deg to Lt knee   PATIENT EDUCATION:  Education details: exam findings, POC Person educated: Patient Education method: Customer service manager Education comprehension: verbalized understanding  HOME EXERCISE PROGRAM: HHPT program  ASSESSMENT:  CLINICAL IMPRESSION:  Seems to be generally improving well with PT, measured LLD per pt request and did find small difference- she has an existing heel lift at home and will try this out in L shoe to see if this improves gait/pain. Otherwise kept working on general strength and ROM today. Incorporated more CKC/standing activities today as well with good tolerance.  OBJECTIVE IMPAIRMENTS: Abnormal gait, decreased activity tolerance, decreased balance, decreased knowledge of use of DME, decreased mobility, difficulty walking, decreased ROM, decreased strength, hypomobility, increased edema, impaired flexibility, obesity, and pain.   ACTIVITY  LIMITATIONS: sitting, standing, squatting, stairs, transfers, bed mobility, and  locomotion level  PARTICIPATION LIMITATIONS: driving, shopping, community activity, occupation, and yard work  PERSONAL FACTORS: Age, Fitness, Past/current experiences, and Social background are also affecting patient's functional outcome.   REHAB POTENTIAL: Excellent  CLINICAL DECISION MAKING: Stable/uncomplicated  EVALUATION COMPLEXITY: Low   GOALS: Goals reviewed with patient? Yes  SHORT TERM GOALS: Target date: 02/20/2022   Will be compliant with appropriate progressive HEP  Baseline: Goal status: INITIAL  2.  Pain to be no more than 3/10 at worst  Baseline:  Goal status: INITIAL  3.  Will be able to ambulate with SPC and normalized gait mechanics household distances no increase in pain  Baseline:  Goal status: INITIAL  4.  Knee extension AROM to be no more than 5*, flexion AROM to be no less than 90* Baseline:  Goal status: INITIAL    LONG TERM GOALS: Target date: 03/13/2022    MMT to have improved by at least 1 grade in all weak groups  Baseline:  Goal status: INITIAL  2.  Knee flexion AROM to be no less than 105* Baseline:  Goal status: INITIAL  3.  Will be able to ambulate at least 1063f with no device and good gait mechanics  Baseline:  Goal status: INITIAL  4.  Will be able to ascend/descend 15 steps with U rail, reciprocal pattern and no increase in pain  Baseline:  Goal status: INITIAL     PLAN:  PT FREQUENCY:  3x/week for first 3 weeks, then 2x/week for next 3 weeks (6 weeks total)  PT DURATION: 6 weeks  PLANNED INTERVENTIONS: Therapeutic exercises, Therapeutic activity, Neuromuscular re-education, Balance training, Gait training, Patient/Family education, Self Care, Joint mobilization, Stair training, DME instructions, Dry Needling, Electrical stimulation, Cryotherapy, Moist heat, Taping, Ultrasound, Ionotophoresis '4mg'$ /ml Dexamethasone, Manual therapy, and  Re-evaluation  PLAN FOR NEXT SESSION:  ROM and strength, gait training PRN without device, manual therapy, vaso if desired.   KDeniece ReePT DPT PN2

## 2022-02-16 ENCOUNTER — Encounter: Payer: Self-pay | Admitting: Physical Therapy

## 2022-02-16 ENCOUNTER — Ambulatory Visit (INDEPENDENT_AMBULATORY_CARE_PROVIDER_SITE_OTHER): Payer: 59 | Admitting: Physical Therapy

## 2022-02-16 DIAGNOSIS — R262 Difficulty in walking, not elsewhere classified: Secondary | ICD-10-CM | POA: Diagnosis not present

## 2022-02-16 DIAGNOSIS — M25662 Stiffness of left knee, not elsewhere classified: Secondary | ICD-10-CM | POA: Diagnosis not present

## 2022-02-16 DIAGNOSIS — R600 Localized edema: Secondary | ICD-10-CM

## 2022-02-16 DIAGNOSIS — M6281 Muscle weakness (generalized): Secondary | ICD-10-CM

## 2022-02-16 DIAGNOSIS — M25562 Pain in left knee: Secondary | ICD-10-CM

## 2022-02-16 NOTE — Therapy (Signed)
OUTPATIENT PHYSICAL THERAPY LOWER EXTREMITY TREATMENT   Patient Name: Kayla Larsen MRN: [id number] DOB:June 30, 1970, 52 y.o., female Today's Date: 02/16/2022  END OF SESSION:  PT End of Session - 02/16/22 1432     Visit Number 9    Number of Visits 16    Date for PT Re-Evaluation 03/13/22    Authorization Type [employer]    Authorization Time Period 01/30/22 to 03/27/22    PT Start Time 1426    PT Stop Time 1514    PT Time Calculation (min) 48 min    Activity Tolerance Patient tolerated treatment well    Behavior During Therapy [facility] for tasks assessed/performed                    Past Medical History:  Diagnosis Date   Acanthosis nigricans    Anemia    Arthritis of knee    History of blood transfusion 2009   2 units after hysterectomy   Hypertension    Obesity    Wears glasses    Past Surgical History:  Procedure Laterality Date   CESAREAN SECTION     x 2   COLONOSCOPY  2009   done for anemia eval; Dr. Benson Norway   COLONOSCOPY  01/25/2018   multiple aphthous ulcers in terminal ileum likely d/t NSAIDs, diverticulosis in sigmoid colon, Dr. Callender Cellar   KNEE SURGERY  10/2015   knot removed from R knee   LAPAROTOMY Left 07/30/2012   Procedure: LEFT SALPINGO OOPHERECTOMY,  OMENTECTOMY, AND LYSIS OF ADHESIONS;  Surgeon: Alvino Chapel, MD;  Location: [facility];  Service: Gynecology;  Laterality: Left;   OOPHORECTOMY  2014   left    TOTAL ABDOMINAL HYSTERECTOMY  2009   TOTAL KNEE ARTHROPLASTY Right 01/14/2021   Procedure: Right TOTAL KNEE ARTHROPLASTY;  Surgeon: [name], MD;  Location: [facility];  Service: Orthopedics;  Laterality: Right;  RNFA   TOTAL KNEE ARTHROPLASTY Left 01/13/2022   Procedure: LEFT TOTAL KNEE ARTHROPLASTY;  Surgeon: [name], MD;  Location: [facility];  Service: Orthopedics;  Laterality: Left;   Patient Active Problem List   Diagnosis Date Noted   Status post total left knee replacement 01/13/2022   BMI 40.0-44.9,  adult ([place]) 01/05/2022   Unilateral primary osteoarthritis, left knee 10/31/2021   Status post total right knee replacement 01/16/2021   Encounter for health maintenance examination in adult 12/27/2020   Screening for heart disease 12/27/2020   Iron deficiency 12/27/2020   Screening for diabetes mellitus 12/27/2020   BMI 38.0-38.9,adult 12/27/2020   Hypokalemia 12/27/2020   Primary osteoarthritis of right knee 11/16/2020   H/O: hysterectomy 09/21/2020   Vaccine counseling 11/20/2019   Encounter for screening mammogram for malignant neoplasm of breast 11/20/2019   Gout of foot 11/20/2019   Primary osteoarthritis of both knees 05/14/2019   Diverticulosis 11/19/2018   Vitamin D deficiency 11/24/2016   Overactive bladder 05/03/2015   Essential hypertension 05/03/2015   Arthritis, senescent 05/03/2015   Hypertriglyceridemia 05/03/2015   Impaired fasting blood sugar 05/03/2015    PCP: Jacklynn Barnacle   REFERRING PROVIDER: [name], MD  REFERRING DIAG: [phone] (ICD-10-CM) - Status post total left knee replacement  THERAPY DIAG:  Acute pain of left knee  Stiffness of left knee, not elsewhere classified  Muscle weakness (generalized)  Difficulty in walking, not elsewhere classified  Localized edema  Rationale for Evaluation and Treatment: Rehabilitation  ONSET DATE: 01/26/2022  SUBJECTIVE:   SUBJECTIVE STATEMENT: Doing well today, c/o  tightness more than pain   PERTINENT HISTORY: The patient comes in today for first postoperative visit status post a left total knee arthroplasty.  We have replaced her right knee remotely.  She is ambulate with a walker.  She does report significant left foot and ankle swelling.  She has been wearing compressive garments but not regularly.  She has been compliant with a baby aspirin twice daily.   The staples are removed from the left knee incision and Steri-Strips have been placed.  Her calf is soft.  Her foot and ankle  is swollen but she can pump her foot back-and-forth and I want her to keep doing that as well as elevation and compressive garment to help with swelling.   I will send in some more pain medication for her.  We will get her set up for outpatient physical therapy hopefully upstairs here for further evaluation and treatment status post a left knee replacement.  From my standpoint I will see her back in 4 weeks for repeat exam but no x-rays are needed.  All questions and concerns were addressed and answered.   PAIN:  Are you having pain? Yes: NPRS scale: 4/10 Pain location: left knee  Pain description: squeezing   Aggravating factors: nothing  Relieving factors: ice  PRECAUTIONS: None  WEIGHT BEARING RESTRICTIONS: No  FALLS:  Has patient fallen in last 6 months? 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 Ninfa Linden mid-February   OBJECTIVE:  PATIENT SURVEYS:  EVAL: FOTO 53, predicted 25  SENSATION: N/T around incision as appropriate post-op, no signs of drainage/erythema/infection at incision looking good   EDEMA:  As appropriate post-op  LOWER EXTREMITY ROM:  Active ROM Left eval Left 02/08/22 Left 02/13/22 Left 02/16/22  Knee flexion 59* supine heel slide  A: 87 supine P:91 A: 93 AA: 97 A: 97 AA: 101  Knee extension 12* supine with heel prop  -5  (Seated LAQ)     (Blank rows = not tested)  LOWER EXTREMITY MMT:  MMT Right eval Left eval  Knee flexion 4+ 4  Knee extension 5 3 limited by  pain    (Blank rows = not tested)  LOWER EXTREMITY SPECIAL TESTS:    FUNCTIONAL TESTS:  EVAL: 5 times sit to stand: 14.7 seconds use of BUEs  Timed up and go (TUG): 17.4 seconds RW  3MWT:   GAIT: Distance walked: 534f  Assistive device utilized: WEnvironmental consultant- 2 wheeled Level of assistance: Modified independence Comments: limited knee flexion B, flexed at hips    TODAY'S TREATMENT:  DATE:  02/16/22 TherEx Scifit bike seat 10 x 8 min; L2.0 LLE knee extension on cable 5# 3x10; LLE knee flexion 15# 3x10 Leg Press 106# 3x10 bil; then 50# on Lt  Slantboard stretch 3x30 sec Calf raises on slantboard x 20 reps Seated AA Lt knee flexion 10 x 10 sec hold; RLE providing overpressure Supine AA heel slides with strap x 10 reps  Modalities Vaso x 10 min to Lt knee, mod pressure; 34 deg  02/15/22 Measured LE lengths per pt request today- from midline patella to top of medial malleolus 41.5cm RLE, 41.25cm L LE consistent with observations with gait, discussed use of heel lift   TherEx  Nustep for flexion stretch L4 X6 minutes (bikes unavailable) LAQs 7# x10 with 3 second lifts Standing TKEs no resistance x20 3 second holds did well isolating quad Standing HS curls 0# x15 Standing hip hikes x12 B Gait training in clinic distances no device, cues for heel toe pattern and knee flexion  MET for quad + immediate knee flexion AAROM at edge of mat table     02/13/22 TherEx Scifit bike seat 10 x 8 min; L2.0 Leg Press bil 100# 3x10; single leg 50# 3x10 LLE LLE knee extension on cable 5# 3x10; LLE knee flexion 15# 3x10 AA knee flexion x 30 reps on Lt  Modalities Vaso x 10 min to Lt knee, mod pressure; 34 deg  02/09/22 TherEx Scifit bike seat 10 x 8 min; L2.0 Leg Press bil 100# 3x10; single leg 37# 3x10 bil Seated AA Lt knee flexion 10 x 10 sec holds; Rt knee providing  AA Supine AA Lt heel slides x 10 reps (95 deg AA measurement)  Manual Seated Lt knee flexion PROM to tolerance  02/08/22 TherEx Scifit bike seat 10 x 8 min; L2.0 Lt LAQ 3# 2x10; 5 sec hold Supine AA heel slide x 20 reps AROM as noted above Long sitting quad sets x 10 reps (visual cues, performed bil)  Gait Training Amb with SPC 100' with supervision, min cues for sequencing; no evidence of imbalance and feel pt safe for household amb and short community distances  Modalities Vaso x 10 min to Lt knee, mod pressure; 34 deg  02/06/22  TherEx  Scifit bike seat 11 --> 10 x6 minutes full rotations  Self knee flexion stretch 10x10 second holds LAQs x15 2# 3 second holds  Tried quad sets unable to recruit quad well today SAQs 2# x20 3 second holds   Manual  Retrograde massage LE elevated Extensive tennis ball massage to lateral thigh     02/02/22  TherEx  Scifit bike seat 13-12 full rotations x8 minutes Leg press 50# DL 2X10, 25# Lt only 2X10, slow eccentrics into max tolerated flexion and extension with seat lock out LAQs 2# 2x10 sitting  Seated knee flexion AAROM stretch 5 sec X 15 Seated hamstring stretch on left 30 sec X 3 Seated SLR on left X10 Supine extension stretch first 2-3 minutes of vaso   Manual Left knee PROM flexion and extension with overpressure to tolerance in sitting  -Vasopnuematic device X 10 min, medium compression, 34 deg to Lt knee   PATIENT EDUCATION:  Education details: exam findings, POC Person educated: Patient Education method: Customer service manager Education comprehension: verbalized understanding  HOME EXERCISE PROGRAM: HHPT program  ASSESSMENT:  CLINICAL IMPRESSION: Steady improvement in flexion noted this week, and overall progressing well with PT.  Will continue to benefit from PT to maximize function.   OBJECTIVE IMPAIRMENTS: Abnormal  gait, decreased activity tolerance, decreased balance, decreased knowledge of  use of DME, decreased mobility, difficulty walking, decreased ROM, decreased strength, hypomobility, increased edema, impaired flexibility, obesity, and pain.   ACTIVITY LIMITATIONS: sitting, standing, squatting, stairs, transfers, bed mobility, and locomotion level  PARTICIPATION LIMITATIONS: driving, shopping, community activity, occupation, and yard work  PERSONAL FACTORS: Age, Fitness, Past/current experiences, and Social background are also affecting patient's functional outcome.   REHAB POTENTIAL: Excellent  CLINICAL DECISION MAKING: Stable/uncomplicated  EVALUATION COMPLEXITY: Low   GOALS: Goals reviewed with patient? Yes  SHORT TERM GOALS: Target date: 02/20/2022   Will be compliant with appropriate progressive HEP  Baseline: Goal status: INITIAL  2.  Pain to be no more than 3/10 at worst  Baseline:  Goal status: INITIAL  3.  Will be able to ambulate with SPC and normalized gait mechanics household distances no increase in pain  Baseline:  Goal status: INITIAL  4.  Knee extension AROM to be no more than 5*, flexion AROM to be no less than 90* Baseline:  Goal status: INITIAL    LONG TERM GOALS: Target date: 03/13/2022    MMT to have improved by at least 1 grade in all weak groups  Baseline:  Goal status: INITIAL  2.  Knee flexion AROM to be no less than 105* Baseline:  Goal status: INITIAL  3.  Will be able to ambulate at least 1025f with no device and good gait mechanics  Baseline:  Goal status: INITIAL  4.  Will be able to ascend/descend 15 steps with U rail, reciprocal pattern and no increase in pain  Baseline:  Goal status: INITIAL     PLAN:  PT FREQUENCY:  3x/week for first 3 weeks, then 2x/week for next 3 weeks (6 weeks total)  PT DURATION: 6 weeks  PLANNED INTERVENTIONS: Therapeutic exercises, Therapeutic activity, Neuromuscular re-education, Balance training, Gait training, Patient/Family education, Self Care, Joint mobilization, Stair  training, DME instructions, Dry Needling, Electrical stimulation, Cryotherapy, Moist heat, Taping, Ultrasound, Ionotophoresis '4mg'$ /ml Dexamethasone, Manual therapy, and Re-evaluation  PLAN FOR NEXT SESSION:  ROM and strength, gait training PRN without device, manual therapy, vaso if desired.   SLaureen Abrahams PT, DPT 02/16/22 3:06 PM

## 2022-02-17 ENCOUNTER — Other Ambulatory Visit: Payer: Self-pay | Admitting: Orthopaedic Surgery

## 2022-02-17 MED ORDER — OXYCODONE HCL 5 MG PO TABS: 5.0000 mg | ORAL_TABLET | ORAL | 0 refills | Status: DC | PRN

## 2022-02-20 ENCOUNTER — Encounter: Payer: Self-pay | Admitting: Physical Therapy

## 2022-02-20 ENCOUNTER — Ambulatory Visit: Payer: 59 | Admitting: Physical Therapy

## 2022-02-20 DIAGNOSIS — R262 Difficulty in walking, not elsewhere classified: Secondary | ICD-10-CM | POA: Diagnosis not present

## 2022-02-20 DIAGNOSIS — R600 Localized edema: Secondary | ICD-10-CM

## 2022-02-20 DIAGNOSIS — M25562 Pain in left knee: Secondary | ICD-10-CM | POA: Diagnosis not present

## 2022-02-20 DIAGNOSIS — M6281 Muscle weakness (generalized): Secondary | ICD-10-CM

## 2022-02-20 DIAGNOSIS — M25662 Stiffness of left knee, not elsewhere classified: Secondary | ICD-10-CM | POA: Diagnosis not present

## 2022-02-20 NOTE — Therapy (Signed)
OUTPATIENT PHYSICAL THERAPY LOWER EXTREMITY TREATMENT   Patient Name: Kayla Larsen MRN: [id number] DOB:28-Apr-1970, 52 y.o., female Today's Date: 02/20/2022  END OF SESSION:  PT End of Session - 02/20/22 1439     Visit Number 10    Number of Visits 16    Date for PT Re-Evaluation 03/13/22    Authorization Type [employer]    Authorization Time Period 01/30/22 to 03/27/22    PT Start Time 1438   pt arrived late   PT Stop Time 1519    PT Time Calculation (min) 41 min    Activity Tolerance Patient tolerated treatment well    Behavior During Therapy [facility] for tasks assessed/performed                     Past Medical History:  Diagnosis Date   Acanthosis nigricans    Anemia    Arthritis of knee    History of blood transfusion 2009   2 units after hysterectomy   Hypertension    Obesity    Wears glasses    Past Surgical History:  Procedure Laterality Date   CESAREAN SECTION     x 2   COLONOSCOPY  2009   done for anemia eval; Dr. Benson Norway   COLONOSCOPY  01/25/2018   multiple aphthous ulcers in terminal ileum likely d/t NSAIDs, diverticulosis in sigmoid colon, Dr. Galien Cellar   KNEE SURGERY  10/2015   knot removed from R knee   LAPAROTOMY Left 07/30/2012   Procedure: LEFT SALPINGO OOPHERECTOMY,  OMENTECTOMY, AND LYSIS OF ADHESIONS;  Surgeon: Alvino Chapel, MD;  Location: [facility];  Service: Gynecology;  Laterality: Left;   OOPHORECTOMY  2014   left    TOTAL ABDOMINAL HYSTERECTOMY  2009   TOTAL KNEE ARTHROPLASTY Right 01/14/2021   Procedure: Right TOTAL KNEE ARTHROPLASTY;  Surgeon: [name], MD;  Location: [facility];  Service: Orthopedics;  Laterality: Right;  RNFA   TOTAL KNEE ARTHROPLASTY Left 01/13/2022   Procedure: LEFT TOTAL KNEE ARTHROPLASTY;  Surgeon: [name], MD;  Location: [facility];  Service: Orthopedics;  Laterality: Left;   Patient Active Problem List   Diagnosis Date Noted   Status post total left knee replacement  01/13/2022   BMI 40.0-44.9, adult ([place]) 01/05/2022   Unilateral primary osteoarthritis, left knee 10/31/2021   Status post total right knee replacement 01/16/2021   Encounter for health maintenance examination in adult 12/27/2020   Screening for heart disease 12/27/2020   Iron deficiency 12/27/2020   Screening for diabetes mellitus 12/27/2020   BMI 38.0-38.9,adult 12/27/2020   Hypokalemia 12/27/2020   Primary osteoarthritis of right knee 11/16/2020   H/O: hysterectomy 09/21/2020   Vaccine counseling 11/20/2019   Encounter for screening mammogram for malignant neoplasm of breast 11/20/2019   Gout of foot 11/20/2019   Primary osteoarthritis of both knees 05/14/2019   Diverticulosis 11/19/2018   Vitamin D deficiency 11/24/2016   Overactive bladder 05/03/2015   Essential hypertension 05/03/2015   Arthritis, senescent 05/03/2015   Hypertriglyceridemia 05/03/2015   Impaired fasting blood sugar 05/03/2015    PCP: Jacklynn Barnacle   REFERRING PROVIDER: [name], MD  REFERRING DIAG: [id number] (ICD-10-CM) - Status post total left knee replacement  THERAPY DIAG:  Acute pain of left knee  Stiffness of left knee, not elsewhere classified  Muscle weakness (generalized)  Difficulty in walking, not elsewhere classified  Localized edema  Rationale for Evaluation and Treatment: Rehabilitation  ONSET DATE: 01/26/2022  SUBJECTIVE:   SUBJECTIVE  STATEMENT: Knee is in more pain today, thinks it may be the weather  PERTINENT HISTORY: The patient comes in today for first postoperative visit status post a left total knee arthroplasty.  We have replaced her right knee remotely.  She is ambulate with a walker.  She does report significant left foot and ankle swelling.  She has been wearing compressive garments but not regularly.  She has been compliant with a baby aspirin twice daily.   The staples are removed from the left knee incision and Steri-Strips have been placed.   Her calf is soft.  Her foot and ankle is swollen but she can pump her foot back-and-forth and I want her to keep doing that as well as elevation and compressive garment to help with swelling.   I will send in some more pain medication for her.  We will get her set up for outpatient physical therapy hopefully upstairs here for further evaluation and treatment status post a left knee replacement.  From my standpoint I will see her back in 4 weeks for repeat exam but no x-rays are needed.  All questions and concerns were addressed and answered.   PAIN:  Are you having pain? Yes: NPRS scale: 4/10 Pain location: left knee  Pain description: squeezing   Aggravating factors: nothing  Relieving factors: ice  PRECAUTIONS: None  WEIGHT BEARING RESTRICTIONS: No  FALLS:  Has patient fallen in last 6 months? 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 Ninfa Linden mid-February   OBJECTIVE:  PATIENT SURVEYS:  02/20/22: FOTO 67  EVAL: FOTO 53, predicted 64   SENSATION: N/T around incision as appropriate post-op, no signs of drainage/erythema/infection at incision looking good   EDEMA:  As appropriate post-op  LOWER EXTREMITY ROM:  Active ROM Left eval Left 02/08/22 Left 02/13/22 Left 02/16/22 Left 02/20/22  Knee flexion 59* supine heel slide  A: 87 supine P:91 A: 93 AA: 97 A: 97 AA: 101 AA: 100  Knee extension 12* supine with heel prop  -5  (Seated LAQ)      (Blank rows = not tested)  LOWER EXTREMITY  MMT:  MMT Right eval Left eval  Knee flexion 4+ 4  Knee extension 5 3 limited by pain    (Blank rows = not tested)  LOWER EXTREMITY SPECIAL TESTS:    FUNCTIONAL TESTS:  EVAL: 5 times sit to stand: 14.7 seconds use of BUEs  Timed up and go (TUG): 17.4 seconds RW  3MWT:   GAIT: Distance walked: 520f  Assistive device utilized: WEnvironmental consultant- 2 wheeled Level of assistance: Modified independence Comments: limited knee flexion B, flexed at hips    TODAY'S TREATMENT:  DATE:  02/20/22 TherEx Scifit bike seat 10 x 8 min; L3.2 Knee extension 5# 3x10 LLE only Knee flexion 15# 3x10 LLE only Leg Press 112# 3x10 bil; then 56# on Lt  AA Lt knee flexion seated 10 x 10 sec hold; RLE providing overpressure   02/16/22 TherEx Scifit bike seat 10 x 8 min; L2.0 LLE knee extension on cable 5# 3x10; LLE knee flexion 15# 3x10 Leg Press 106# 3x10 bil; then 50# on Lt  Slantboard stretch 3x30 sec Calf raises on slantboard x 20 reps Seated AA Lt knee flexion 10 x 10 sec hold; RLE providing overpressure Supine AA heel slides with strap x 10 reps  Modalities Vaso x 10 min to Lt knee, mod pressure; 34 deg  02/15/22 Measured LE lengths per pt request today- from midline patella to top of medial malleolus 41.5cm RLE, 41.25cm L LE consistent with observations with gait, discussed use of heel lift   TherEx  Nustep for flexion stretch L4 X6 minutes (bikes unavailable) LAQs 7# x10 with 3 second lifts Standing TKEs no resistance x20 3 second holds did well isolating quad Standing HS curls 0# x15 Standing hip hikes x12 B Gait training in clinic distances no device, cues for heel toe pattern and knee flexion  MET for quad + immediate knee flexion AAROM at edge of mat table    PATIENT EDUCATION:  Education details: exam findings, POC Person educated: Patient Education method:  Customer service manager Education comprehension: verbalized understanding  HOME EXERCISE PROGRAM: HHPT program  ASSESSMENT:  CLINICAL IMPRESSION: Pt tolerated session well today with continued focus on strengthening and ROM. Will continue to benefit from PT to maximize function.  FOTO met today.   OBJECTIVE IMPAIRMENTS: Abnormal gait, decreased activity tolerance, decreased balance, decreased knowledge of use of DME, decreased mobility, difficulty walking, decreased ROM, decreased strength, hypomobility, increased edema, impaired flexibility, obesity, and pain.   ACTIVITY LIMITATIONS: sitting, standing, squatting, stairs, transfers, bed mobility, and locomotion level  PARTICIPATION LIMITATIONS: driving, shopping, community activity, occupation, and yard work  PERSONAL FACTORS: Age, Fitness, Past/current experiences, and Social background are also affecting patient's functional outcome.   REHAB POTENTIAL: Excellent  CLINICAL DECISION MAKING: Stable/uncomplicated  EVALUATION COMPLEXITY: Low   GOALS: Goals reviewed with patient? Yes  SHORT TERM GOALS: Target date: 02/20/2022   Will be compliant with appropriate progressive HEP  Baseline: Goal status: INITIAL  2.  Pain to be no more than 3/10 at worst  Baseline:  Goal status: INITIAL  3.  Will be able to ambulate with SPC and normalized gait mechanics household distances no increase in pain  Baseline:  Goal status: INITIAL  4.  Knee extension AROM to be no more than 5*, flexion AROM to be no less than 90* Baseline:  Goal status: INITIAL    LONG TERM GOALS: Target date: 03/13/2022    MMT to have improved by at least 1 grade in all weak groups  Baseline:  Goal status: INITIAL  2.  Knee flexion AROM to be no less than 105* Baseline:  Goal status: INITIAL  3.  Will be able to ambulate at least 1029f with no device and good gait mechanics  Baseline:  Goal status: INITIAL  4.  Will be able to ascend/descend  15 steps with U rail, reciprocal pattern and no increase in pain  Baseline:  Goal status: INITIAL     PLAN:  PT FREQUENCY:  3x/week for first 3 weeks, then 2x/week for next 3 weeks (6 weeks total)  PT  DURATION: 6 weeks  PLANNED INTERVENTIONS: Therapeutic exercises, Therapeutic activity, Neuromuscular re-education, Balance training, Gait training, Patient/Family education, Self Care, Joint mobilization, Stair training, DME instructions, Dry Needling, Electrical stimulation, Cryotherapy, Moist heat, Taping, Ultrasound, Ionotophoresis 50m/ml Dexamethasone, Manual therapy, and Re-evaluation  PLAN FOR NEXT SESSION:  ROM and strength, balance, gait training PRN without device, manual therapy, vaso if desired.   SLaureen Abrahams PT, DPT 02/20/22 3:11 PM

## 2022-02-22 ENCOUNTER — Ambulatory Visit: Payer: 59 | Admitting: Physical Therapy

## 2022-02-22 ENCOUNTER — Encounter: Payer: Self-pay | Admitting: Physical Therapy

## 2022-02-22 DIAGNOSIS — M6281 Muscle weakness (generalized): Secondary | ICD-10-CM | POA: Diagnosis not present

## 2022-02-22 DIAGNOSIS — R262 Difficulty in walking, not elsewhere classified: Secondary | ICD-10-CM | POA: Diagnosis not present

## 2022-02-22 DIAGNOSIS — M25662 Stiffness of left knee, not elsewhere classified: Secondary | ICD-10-CM | POA: Diagnosis not present

## 2022-02-22 DIAGNOSIS — M25562 Pain in left knee: Secondary | ICD-10-CM | POA: Diagnosis not present

## 2022-02-22 DIAGNOSIS — R600 Localized edema: Secondary | ICD-10-CM

## 2022-02-22 NOTE — Therapy (Signed)
OUTPATIENT PHYSICAL THERAPY LOWER EXTREMITY TREATMENT   Patient Name: Kayla Larsen MRN: [id number] DOB:01/20/1970, 52 y.o., female Today's Date: 02/22/2022  END OF SESSION:  PT End of Session - 02/22/22 1348     Visit Number 11    Number of Visits 16    Date for PT Re-Evaluation 03/13/22    Authorization Type [employer]    Authorization Time Period 01/30/22 to 03/27/22    PT Start Time 1345    PT Stop Time 1430    PT Time Calculation (min) 45 min    Activity Tolerance Patient tolerated treatment well    Behavior During Therapy [facility] for tasks assessed/performed                      Past Medical History:  Diagnosis Date   Acanthosis nigricans    Anemia    Arthritis of knee    History of blood transfusion 2009   2 units after hysterectomy   Hypertension    Obesity    Wears glasses    Past Surgical History:  Procedure Laterality Date   CESAREAN SECTION     x 2   COLONOSCOPY  2009   done for anemia eval; Dr. Benson Norway   COLONOSCOPY  01/25/2018   multiple aphthous ulcers in terminal ileum likely d/t NSAIDs, diverticulosis in sigmoid colon, Dr.  Cellar   KNEE SURGERY  10/2015   knot removed from R knee   LAPAROTOMY Left 07/30/2012   Procedure: LEFT SALPINGO OOPHERECTOMY,  OMENTECTOMY, AND LYSIS OF ADHESIONS;  Surgeon: Alvino Chapel, MD;  Location: [facility];  Service: Gynecology;  Laterality: Left;   OOPHORECTOMY  2014   left    TOTAL ABDOMINAL HYSTERECTOMY  2009   TOTAL KNEE ARTHROPLASTY Right 01/14/2021   Procedure: Right TOTAL KNEE ARTHROPLASTY;  Surgeon: [name], MD;  Location[facility];  Service: Orthopedics;  Laterality: Right;  RNFA   TOTAL KNEE ARTHROPLASTY Left 01/13/2022   Procedure: LEFT TOTAL KNEE ARTHROPLASTY;  Surgeon: [name], MD;  Location[facility];  Service: Orthopedics;  Laterality: Left;   Patient Active Problem List   Diagnosis Date Noted   Status post total left knee replacement 01/13/2022   BMI  40.0-44.9, adult ([place]) 01/05/2022   Unilateral primary osteoarthritis, left knee 10/31/2021   Status post total right knee replacement 01/16/2021   Encounter for health maintenance examination in adult 12/27/2020   Screening for heart disease 12/27/2020   Iron deficiency 12/27/2020   Screening for diabetes mellitus 12/27/2020   BMI 38.0-38.9,adult 12/27/2020   Hypokalemia 12/27/2020   Primary osteoarthritis of right knee 11/16/2020   H/O: hysterectomy 09/21/2020   Vaccine counseling 11/20/2019   Encounter for screening mammogram for malignant neoplasm of breast 11/20/2019   Gout of foot 11/20/2019   Primary osteoarthritis of both knees 05/14/2019   Diverticulosis 11/19/2018   Vitamin D deficiency 11/24/2016   Overactive bladder 05/03/2015   Essential hypertension 05/03/2015   Arthritis, senescent 05/03/2015   Hypertriglyceridemia 05/03/2015   Impaired fasting blood sugar 05/03/2015    PCP: Jacklynn Barnacle   REFERRING PROVIDER: [name], MD  REFERRING DIAG: [phone] (ICD-10-CM) - Status post total left knee replacement  THERAPY DIAG:  Acute pain of left knee  Stiffness of left knee, not elsewhere classified  Muscle weakness (generalized)  Difficulty in walking, not elsewhere classified  Localized edema  Rationale for Evaluation and Treatment: Rehabilitation  ONSET DATE: 01/26/2022  SUBJECTIVE:   SUBJECTIVE STATEMENT: Pain is  better today, still lateral pain  PERTINENT HISTORY: The patient comes in today for first postoperative visit status post a left total knee arthroplasty.  We have replaced her right knee remotely.  She is ambulate with a walker.  She does report significant left foot and ankle swelling.  She has been wearing compressive garments but not regularly.  She has been compliant with a baby aspirin twice daily.   The staples are removed from the left knee incision and Steri-Strips have been placed.  Her calf is soft.  Her foot and  ankle is swollen but she can pump her foot back-and-forth and I want her to keep doing that as well as elevation and compressive garment to help with swelling.   I will send in some more pain medication for her.  We will get her set up for outpatient physical therapy hopefully upstairs here for further evaluation and treatment status post a left knee replacement.  From my standpoint I will see her back in 4 weeks for repeat exam but no x-rays are needed.  All questions and concerns were addressed and answered.   PAIN:  Are you having pain? Yes: NPRS scale: 4/10 Pain location: left knee  Pain description: squeezing   Aggravating factors: nothing  Relieving factors: ice  PRECAUTIONS: None  WEIGHT BEARING RESTRICTIONS: No  FALLS:  Has patient fallen in last 6 months? 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 Ninfa Linden mid-February   OBJECTIVE:  PATIENT SURVEYS:  02/20/22: FOTO 67  EVAL: FOTO 53, predicted 61   SENSATION: N/T around incision as appropriate post-op, no signs of drainage/erythema/infection at incision looking good   EDEMA:  As appropriate post-op  LOWER EXTREMITY ROM:  Active ROM Left eval Left 02/08/22 Left 02/13/22 Left 02/16/22 Left 02/20/22 Left 02/22/22  Knee flexion 59* supine heel slide  A: 87 supine P:91 A: 93 AA: 97 A: 97 AA: 101 AA: 100 A: 98 AA: 104   Knee extension 12* supine with heel prop  -5  (Seated LAQ)    A: 0 (seated LAQ)   (Blank rows = not  tested)  LOWER EXTREMITY MMT:  MMT Right eval Left eval  Knee flexion 4+ 4  Knee extension 5 3 limited by pain    (Blank rows = not tested)  LOWER EXTREMITY SPECIAL TESTS:    FUNCTIONAL TESTS:  EVAL: 5 times sit to stand: 14.7 seconds use of BUEs  Timed up and go (TUG): 17.4 seconds RW  3MWT:   GAIT: Distance walked: 542f  Assistive device utilized: WEnvironmental consultant- 2 wheeled Level of assistance: Modified independence Comments: limited knee flexion B, flexed at hips    TODAY'S TREATMENT:  DATE:  02/22/22 TherEx Recumbent bike seat 7 x 8 min; partial revolutions Foot on 4" step with heel taps to floor x 15 reps bil Seated AA Lt knee flexion 10 x 10 sec hold ROM measurements - see above Knee extension 5# LLE only 3x10 Knee flexion 15# LLE only 3x10  Modalities Vaso x 10 min to Lt knee, mod pressure; 34 deg   02/20/22 TherEx Scifit bike seat 10 x 8 min; L3.2 Knee extension 5# 3x10 LLE only Knee flexion 15# 3x10 LLE only Leg Press 112# 3x10 bil; then 56# on Lt  AA Lt knee flexion seated 10 x 10 sec hold; RLE providing overpressure  Modalities Vaso x 10 min to Lt knee, mod pressure; 34 deg  02/16/22 TherEx Scifit bike seat 10 x 8 min; L2.0 LLE knee extension on cable 5# 3x10; LLE knee flexion 15# 3x10 Leg Press 106# 3x10 bil; then 50# on Lt  Slantboard stretch 3x30 sec Calf raises on slantboard x 20 reps Seated AA Lt knee flexion 10 x 10 sec hold; RLE providing overpressure Supine AA heel slides with strap x 10 reps  Modalities Vaso x 10 min to Lt knee, mod pressure; 34 deg  02/15/22 Measured LE lengths per pt request today- from midline patella to top of medial malleolus 41.5cm RLE, 41.25cm L LE consistent with observations with gait, discussed use of heel lift   TherEx  Nustep for flexion stretch L4 X6 minutes (bikes unavailable) LAQs 7#  x10 with 3 second lifts Standing TKEs no resistance x20 3 second holds did well isolating quad Standing HS curls 0# x15 Standing hip hikes x12 B Gait training in clinic distances no device, cues for heel toe pattern and knee flexion  MET for quad + immediate knee flexion AAROM at edge of mat table    PATIENT EDUCATION:  Education details: exam findings, POC Person educated: Patient Education method: Customer service manager Education comprehension: verbalized understanding  HOME EXERCISE PROGRAM: HHPT program  ASSESSMENT:  CLINICAL IMPRESSION: Steady improvement in ROM today.  Will continue to benefit from PT to maximize function.  OBJECTIVE IMPAIRMENTS: Abnormal gait, decreased activity tolerance, decreased balance, decreased knowledge of use of DME, decreased mobility, difficulty walking, decreased ROM, decreased strength, hypomobility, increased edema, impaired flexibility, obesity, and pain.   ACTIVITY LIMITATIONS: sitting, standing, squatting, stairs, transfers, bed mobility, and locomotion level  PARTICIPATION LIMITATIONS: driving, shopping, community activity, occupation, and yard work  PERSONAL FACTORS: Age, Fitness, Past/current experiences, and Social background are also affecting patient's functional outcome.   REHAB POTENTIAL: Excellent  CLINICAL DECISION MAKING: Stable/uncomplicated  EVALUATION COMPLEXITY: Low   GOALS: Goals reviewed with patient? Yes  SHORT TERM GOALS: Target date: 02/20/2022   Will be compliant with appropriate progressive HEP  Baseline: Goal status: MET 02/22/22  2.  Pain to be no more than 3/10 at worst  Baseline:  Goal status: ongoing 02/22/22  3.  Will be able to ambulate with SPC and normalized gait mechanics household distances no increase in pain  Baseline:  Goal status: MET 02/22/22 (no AD)  4.  Knee extension AROM to be no more than 5*, flexion AROM to be no less than 90* Baseline:  Goal status: MET 02/22/22    LONG  TERM GOALS: Target date: 03/13/2022    MMT to have improved by at least 1 grade in all weak groups  Baseline:  Goal status: INITIAL  2.  Knee flexion AROM to be no less than 105* Baseline:  Goal status: INITIAL  3.  Will be able to ambulate at least 1057f with no device and good gait mechanics  Baseline:  Goal status: INITIAL  4.  Will be able to ascend/descend 15 steps with U rail, reciprocal pattern and no increase in pain  Baseline:  Goal status: INITIAL     PLAN:  PT FREQUENCY:  3x/week for first 3 weeks, then 2x/week for next 3 weeks (6 weeks total)  PT DURATION: 6 weeks  PLANNED INTERVENTIONS: Therapeutic exercises, Therapeutic activity, Neuromuscular re-education, Balance training, Gait training, Patient/Family education, Self Care, Joint mobilization, Stair training, DME instructions, Dry Needling, Electrical stimulation, Cryotherapy, Moist heat, Taping, Ultrasound, Ionotophoresis 476mml Dexamethasone, Manual therapy, and Re-evaluation  PLAN FOR NEXT SESSION:  see what MD says,  ROM and strength, balance, gait training PRN without device, manual therapy, vaso if desired.   StLaureen AbrahamsPT, DPT 02/22/22 2:23 PM

## 2022-02-23 ENCOUNTER — Ambulatory Visit (INDEPENDENT_AMBULATORY_CARE_PROVIDER_SITE_OTHER): Payer: 59 | Admitting: Orthopaedic Surgery

## 2022-02-23 ENCOUNTER — Encounter: Payer: Self-pay | Admitting: Orthopaedic Surgery

## 2022-02-23 DIAGNOSIS — Z96652 Presence of left artificial knee joint: Secondary | ICD-10-CM

## 2022-02-23 MED ORDER — OXYCODONE HCL 5 MG PO TABS: 5.0000 mg | ORAL_TABLET | Freq: Four times a day (QID) | ORAL | 0 refills | Status: DC | PRN

## 2022-02-23 NOTE — Progress Notes (Signed)
The patient is now about 6 weeks tomorrow status post a left total knee arthroplasty.  We replaced her right knee remotely.  She is doing excellent.  She is 52 years old and walking without assistive device.  She is still going to physical therapy and does need a refill of oxycodone.  On exam she has much improved range of motion of her left knee.  She is past 100 degrees of flexion and extension is almost full.  The knee feels limply stable.  She will continue remain out of work as she rehabilitates her knee.  I will refill her oxycodone.  After then we will need to start weaning her to hydrocodone.  Will see her back in 4 weeks for repeat exam but no x-rays are needed.

## 2022-02-27 ENCOUNTER — Other Ambulatory Visit: Payer: Self-pay | Admitting: Orthopaedic Surgery

## 2022-02-27 ENCOUNTER — Encounter: Payer: Self-pay | Admitting: Physical Therapy

## 2022-02-27 ENCOUNTER — Ambulatory Visit: Payer: 59 | Admitting: Physical Therapy

## 2022-02-27 DIAGNOSIS — M25662 Stiffness of left knee, not elsewhere classified: Secondary | ICD-10-CM | POA: Diagnosis not present

## 2022-02-27 DIAGNOSIS — R262 Difficulty in walking, not elsewhere classified: Secondary | ICD-10-CM | POA: Diagnosis not present

## 2022-02-27 DIAGNOSIS — M25562 Pain in left knee: Secondary | ICD-10-CM

## 2022-02-27 DIAGNOSIS — M6281 Muscle weakness (generalized): Secondary | ICD-10-CM | POA: Diagnosis not present

## 2022-02-27 DIAGNOSIS — R600 Localized edema: Secondary | ICD-10-CM

## 2022-02-27 NOTE — Therapy (Signed)
OUTPATIENT PHYSICAL THERAPY LOWER EXTREMITY TREATMENT   Patient Name: Kayla Larsen MRN: [id number] DOB:11-13-1970, 52 y.o., female Today's Date: 02/27/2022  END OF SESSION:  PT End of Session - 02/27/22 1345     Visit Number 12    Number of Visits 16    Date for PT Re-Evaluation 03/13/22    Authorization Type [employer]    Authorization Time Period 01/30/22 to 03/27/22    PT Start Time 1343    PT Stop Time 1435    PT Time Calculation (min) 52 min    Activity Tolerance Patient tolerated treatment well    Behavior During Therapy [facility] for tasks assessed/performed                       Past Medical History:  Diagnosis Date   Acanthosis nigricans    Anemia    Arthritis of knee    History of blood transfusion 2009   2 units after hysterectomy   Hypertension    Obesity    Wears glasses    Past Surgical History:  Procedure Laterality Date   CESAREAN SECTION     x 2   COLONOSCOPY  2009   done for anemia eval; Dr. Benson [place]   COLONOSCOPY  01/25/2018   multiple aphthous ulcers in terminal ileum likely d/t NSAIDs, diverticulosis in sigmoid colon, Dr. Elsah Cellar   KNEE SURGERY  10/2015   knot removed from R knee   LAPAROTOMY Left 07/30/2012   Procedure: LEFT SALPINGO OOPHERECTOMY,  OMENTECTOMY, AND LYSIS OF ADHESIONS;  Surgeon: Alvino Chapel, MD;  Location: [facility];  Service: Gynecology;  Laterality: Left;   OOPHORECTOMY  2014   left    TOTAL ABDOMINAL HYSTERECTOMY  2009   TOTAL KNEE ARTHROPLASTY Right 01/14/2021   Procedure: Right TOTAL KNEE ARTHROPLASTY;  Surgeon: [name], MD;  Location: [facility];  Service: Orthopedics;  Laterality: Right;  RNFA   TOTAL KNEE ARTHROPLASTY Left 01/13/2022   Procedure: LEFT TOTAL KNEE ARTHROPLASTY;  Surgeon: [name], MD;  Location: [facility];  Service: Orthopedics;  Laterality: Left;   Patient Active Problem List   Diagnosis Date Noted   Status post total left knee replacement 01/13/2022   BMI  40.0-44.9, adult ([place]) 01/05/2022   Unilateral primary osteoarthritis, left knee 10/31/2021   Status post total right knee replacement 01/16/2021   Encounter for health maintenance examination in adult 12/27/2020   Screening for heart disease 12/27/2020   Iron deficiency 12/27/2020   Screening for diabetes mellitus 12/27/2020   BMI 38.0-38.9,adult 12/27/2020   Hypokalemia 12/27/2020   Primary osteoarthritis of right knee 11/16/2020   H/O: hysterectomy 09/21/2020   Vaccine counseling 11/20/2019   Encounter for screening mammogram for malignant neoplasm of breast 11/20/2019   Gout of foot 11/20/2019   Primary osteoarthritis of both knees 05/14/2019   Diverticulosis 11/19/2018   Vitamin D deficiency 11/24/2016   Overactive bladder 05/03/2015   Essential hypertension 05/03/2015   Arthritis, senescent 05/03/2015   Hypertriglyceridemia 05/03/2015   Impaired fasting blood sugar 05/03/2015    PCP: Jacklynn Barnacle   REFERRING PROVIDER: [name], MD  REFERRING DIAG: [phone] (ICD-10-CM) - Status post total left knee replacement  THERAPY DIAG:  Acute pain of left knee  Stiffness of left knee, not elsewhere classified  Muscle weakness (generalized)  Difficulty in walking, not elsewhere classified  Localized edema  Rationale for Evaluation and Treatment: Rehabilitation  ONSET DATE: 01/26/2022  SUBJECTIVE:   SUBJECTIVE STATEMENT: MD  is pleased with progress so far; still having lateral pain  PERTINENT HISTORY: The patient comes in today for first postoperative visit status post a left total knee arthroplasty.  We have replaced her right knee remotely.  She is ambulate with a walker.  She does report significant left foot and ankle swelling.  She has been wearing compressive garments but not regularly.  She has been compliant with a baby aspirin twice daily.   The staples are removed from the left knee incision and Steri-Strips have been placed.  Her calf is  soft.  Her foot and ankle is swollen but she can pump her foot back-and-forth and I want her to keep doing that as well as elevation and compressive garment to help with swelling.   I will send in some more pain medication for her.  We will get her set up for outpatient physical therapy hopefully upstairs here for further evaluation and treatment status post a left knee replacement.  From my standpoint I will see her back in 4 weeks for repeat exam but no x-rays are needed.  All questions and concerns were addressed and answered.   PAIN:  Are you having pain? Yes: NPRS scale: denies pain; c/o heaviness rated 6-7/10 Pain location: left knee (lateral side) Pain description: squeezing   Aggravating factors: nothing  Relieving factors: ice  PRECAUTIONS: None  WEIGHT BEARING RESTRICTIONS: No  FALLS:  Has patient fallen in last 6 months? 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, lots of seated work at [place]: [place], [place] with basic ADLs, Independent with gait, and Independent with transfers  PATIENT GOALS: be able to walk better, bend both knees   NEXT MD VISIT: 03/27/22   OBJECTIVE:  PATIENT SURVEYS:  02/20/22: FOTO 67  EVAL: FOTO 53, predicted 18   SENSATION: N/T around incision as appropriate post-op, no signs of drainage/erythema/infection at incision looking good   EDEMA:  As appropriate post-op  LOWER EXTREMITY ROM:  Active ROM Left eval Left 02/08/22 Left 02/13/22 Left 02/16/22 Left 02/20/22 Left 02/22/22  Knee flexion 59* supine heel slide  A: 87 supine P:91 A: 93 AA: 97 A: 97 AA: 101 AA: 100 A: 98 AA: 104   Knee extension 12* supine with heel prop  -5  (Seated LAQ)     A: 0 (seated LAQ)   (Blank rows = not tested)  LOWER EXTREMITY MMT:  MMT Right eval Left eval  Knee flexion 4+ 4  Knee extension 5 3 limited by pain    (Blank rows = not tested)  LOWER EXTREMITY SPECIAL TESTS:    FUNCTIONAL TESTS:  EVAL: 5 times sit to stand: 14.7 seconds use of BUEs  Timed up and go (TUG): 17.4 seconds RW  3MWT:   GAIT: Distance walked: 541f  Assistive device utilized: WEnvironmental consultant- 2 wheeled Level of assistance: Modified independence Comments: limited knee flexion B, flexed at hips    TODAY'S TREATMENT:  DATE:  02/27/22 TherEx Recumbent bike seat 7 x 8 min; full revolutions Leg Press 118# 3x10 bil; 68# 3x10 LLE only Calf raises 3x10 with light UE support Forward step ups onto 4" step x10 bil Forward step downs from 4" step x 10 bil  Neuro Re-Ed Side stepping on balance beam x 5 laps in // bars with intermittent UE support Tandem walking on balance beam x 5 laps in // bars; light UE support needed Tandem stand on foam beam 30 sec x 3 reps bil; intermittent UE support  Modalities Vaso x 10 min to Lt knee, mod pressure; 34 deg  02/22/22 TherEx Recumbent bike seat 7 x 8 min; partial revolutions Foot on 4" step with heel taps to floor x 15 reps bil Seated AA Lt knee flexion 10 x 10 sec hold ROM measurements - see above Knee extension 5# LLE only 3x10 Knee flexion 15# LLE only 3x10  Modalities Vaso x 10 min to Lt knee, mod pressure; 34 deg   02/20/22 TherEx Scifit bike seat 10 x 8 min; L3.2 Knee extension 5# 3x10 LLE only Knee flexion 15# 3x10 LLE only Leg Press 112# 3x10 bil; then 56# on Lt  AA Lt knee flexion seated 10 x 10 sec hold; RLE providing overpressure  Modalities Vaso x 10 min to Lt knee, mod pressure; 34 deg  02/16/22 TherEx Scifit bike seat 10 x 8 min; L2.0 LLE knee extension on cable 5# 3x10; LLE knee  flexion 15# 3x10 Leg Press 106# 3x10 bil; then 50# on Lt  Slantboard stretch 3x30 sec Calf raises on slantboard x 20 reps Seated AA Lt knee flexion 10 x 10 sec hold; RLE providing overpressure Supine AA heel slides with strap x 10 reps  Modalities Vaso x 10 min to Lt knee, mod pressure; 34 deg  02/15/22 Measured LE lengths per pt request today- from midline patella to top of medial malleolus 41.5cm RLE, 41.25cm L LE consistent with observations with gait, discussed use of heel lift   TherEx  Nustep for flexion stretch L4 X6 minutes (bikes unavailable) LAQs 7# x10 with 3 second lifts Standing TKEs no resistance x20 3 second holds did well isolating quad Standing HS curls 0# x15 Standing hip hikes x12 B Gait training in clinic distances no device, cues for heel toe pattern and knee flexion  MET for quad + immediate knee flexion AAROM at edge of mat table    PATIENT EDUCATION:  Education details: exam findings, POC Person educated: Patient Education method: Customer service manager Education comprehension: verbalized understanding  HOME EXERCISE PROGRAM: HHPT program  ASSESSMENT:  CLINICAL IMPRESSION: Pt tolerated session well today.  Anticipate d/c next week so will begin d/c planning.     OBJECTIVE IMPAIRMENTS: Abnormal gait, decreased activity tolerance, decreased balance, decreased knowledge of use of DME, decreased mobility, difficulty walking, decreased ROM, decreased strength, hypomobility, increased edema, impaired flexibility, obesity, and pain.   ACTIVITY LIMITATIONS: sitting, standing, squatting, stairs, transfers, bed mobility, and locomotion level  PARTICIPATION LIMITATIONS: driving, shopping, community activity, occupation, and yard work  PERSONAL FACTORS: Age, Fitness, Past/current experiences, and Social background are also affecting patient's functional outcome.   REHAB POTENTIAL: Excellent  CLINICAL DECISION MAKING: Stable/uncomplicated  EVALUATION  COMPLEXITY: Low   GOALS: Goals reviewed with patient? Yes  SHORT TERM GOALS: Target date: 02/20/2022   Will be compliant with appropriate progressive HEP  Baseline: Goal status: MET 02/22/22  2.  Pain to be no more than 3/10 at worst  Baseline:  Goal  status: ongoing 02/22/22  3.  Will be able to ambulate with SPC and normalized gait mechanics household distances no increase in pain  Baseline:  Goal status: MET 02/22/22 (no AD)  4.  Knee extension AROM to be no more than 5*, flexion AROM to be no less than 90* Baseline:  Goal status: MET 02/22/22    LONG TERM GOALS: Target date: 03/13/2022    MMT to have improved by at least 1 grade in all weak groups  Baseline:  Goal status: INITIAL  2.  Knee flexion AROM to be no less than 105* Baseline:  Goal status: INITIAL  3.  Will be able to ambulate at least 1040f with no device and good gait mechanics  Baseline:  Goal status: INITIAL  4.  Will be able to ascend/descend 15 steps with U rail, reciprocal pattern and no increase in pain  Baseline:  Goal status: INITIAL     PLAN:  PT FREQUENCY:  3x/week for first 3 weeks, then 2x/week for next 3 weeks (6 weeks total)  PT DURATION: 6 weeks  PLANNED INTERVENTIONS: Therapeutic exercises, Therapeutic activity, Neuromuscular re-education, Balance training, Gait training, Patient/Family education, Self Care, Joint mobilization, Stair training, DME instructions, Dry Needling, Electrical stimulation, Cryotherapy, Moist heat, Taping, Ultrasound, Ionotophoresis 491mml Dexamethasone, Manual therapy, and Re-evaluation  PLAN FOR NEXT SESSION:  review/update HEP, work on any final concerns for d/c, ROM and strength, balance  StLaureen AbrahamsPT, DPT 02/27/22 2:29 PM

## 2022-03-01 ENCOUNTER — Encounter: Payer: Self-pay | Admitting: Physical Therapy

## 2022-03-01 ENCOUNTER — Ambulatory Visit: Payer: 59 | Admitting: Physical Therapy

## 2022-03-01 DIAGNOSIS — M6281 Muscle weakness (generalized): Secondary | ICD-10-CM

## 2022-03-01 DIAGNOSIS — M25662 Stiffness of left knee, not elsewhere classified: Secondary | ICD-10-CM | POA: Diagnosis not present

## 2022-03-01 DIAGNOSIS — M25562 Pain in left knee: Secondary | ICD-10-CM

## 2022-03-01 DIAGNOSIS — R262 Difficulty in walking, not elsewhere classified: Secondary | ICD-10-CM | POA: Diagnosis not present

## 2022-03-01 NOTE — Therapy (Signed)
OUTPATIENT PHYSICAL THERAPY LOWER EXTREMITY TREATMENT   Patient Name: Kayla Larsen MRN: [id number] DOB:1970-10-03, 52 y.o., female Today's Date: 03/01/2022  END OF SESSION:  PT End of Session - 03/01/22 1355     Visit Number 13    Number of Visits 16    Date for PT Re-Evaluation 03/13/22    Authorization Type [employer]et[employer]    Authorization Time Period 01/30/22 to 03/27/22    PT Start Time 1348    PT Stop Time 1428    PT Time Calculation (min) 40 min    Activity Tolerance Patient tolerated treatment well    Behavior During Therapy [facility] for tasks assessed/performed                        Past Medical History:  Diagnosis Date   Acanthosis nigricans    Anemia    Arthritis of knee    History of blood transfusion 2009   2 units after hysterectomy   Hypertension    Obesity    Wears glasses    Past Surgical History:  Procedure Laterality Date   CESAREAN SECTION     x 2   COLONOSCOPY  2009   done for anemia eval; Dr. Benson Norway   COLONOSCOPY  01/25/2018   multiple aphthous ulcers in terminal ileum likely d/t NSAIDs, diverticulosis in sigmoid colon, Dr. Hollins Cellar   KNEE SURGERY  10/2015   knot removed from R knee   LAPAROTOMY Left 07/30/2012   Procedure: LEFT SALPINGO OOPHERECTOMY,  OMENTECTOMY, AND LYSIS OF ADHESIONS;  Surgeon: Alvino Chapel, MD;  Location: [facility];  Service: Gynecology;  Laterality: Left;   OOPHORECTOMY  2014   left    TOTAL ABDOMINAL HYSTERECTOMY  2009   TOTAL KNEE ARTHROPLASTY Right 01/14/2021   Procedure: Right TOTAL KNEE ARTHROPLASTY;  Surgeon: [name], MD;  Location: [facility];  Service: Orthopedics;  Laterality: Right;  RNFA   TOTAL KNEE ARTHROPLASTY Left 01/13/2022   Procedure: LEFT TOTAL KNEE ARTHROPLASTY;  Surgeon: [name], MD;  Location[facility];  Service: Orthopedics;  Laterality: Left;   Patient Active Problem List   Diagnosis Date Noted   Status post total left knee replacement 01/13/2022   BMI  40.0-44.9, adult ([place]) 01/05/2022   Unilateral primary osteoarthritis, left knee 10/31/2021   Status post total right knee replacement 01/16/2021   Encounter for health maintenance examination in adult 12/27/2020   Screening for heart disease 12/27/2020   Iron deficiency 12/27/2020   Screening for diabetes mellitus 12/27/2020   BMI 38.0-38.9,adult 12/27/2020   Hypokalemia 12/27/2020   Primary osteoarthritis of right knee 11/16/2020   H/O: hysterectomy 09/21/2020   Vaccine counseling 11/20/2019   Encounter for screening mammogram for malignant neoplasm of breast 11/20/2019   Gout of foot 11/20/2019   Primary osteoarthritis of both knees 05/14/2019   Diverticulosis 11/19/2018   Vitamin D deficiency 11/24/2016   Overactive bladder 05/03/2015   Essential hypertension 05/03/2015   Arthritis, senescent 05/03/2015   Hypertriglyceridemia 05/03/2015   Impaired fasting blood sugar 05/03/2015    PCP: Jacklynn Barnacle   REFERRING PROVIDER: [name], MD  REFERRING DIAG: [phone] (ICD-10-CM) - Status post total left knee replacement  THERAPY DIAG:  Acute pain of left knee  Muscle weakness (generalized)  Stiffness of left knee, not elsewhere classified  Difficulty in walking, not elsewhere classified  Rationale for Evaluation and Treatment: Rehabilitation  ONSET DATE: 01/26/2022  SUBJECTIVE:   SUBJECTIVE STATEMENT:  They told  me we may be able to finish up PT next week, knee just feel "heavy". I can do everything I want and need. Just need to remember to bend the knee when I'm walking.   PERTINENT HISTORY: The patient comes in today for first postoperative visit status post a left total knee arthroplasty.  We have replaced her right knee remotely.  She is ambulate with a walker.  She does report significant left foot and ankle swelling.  She has been wearing compressive garments but not regularly.  She has been compliant with a baby aspirin twice daily.   The  staples are removed from the left knee incision and Steri-Strips have been placed.  Her calf is soft.  Her foot and ankle is swollen but she can pump her foot back-and-forth and I want her to keep doing that as well as elevation and compressive garment to help with swelling.   I will send in some more pain medication for her.  We will get her set up for outpatient physical therapy hopefully upstairs here for further evaluation and treatment status post a left knee replacement.  From my standpoint I will see her back in 4 weeks for repeat exam but no x-rays are needed.  All questions and concerns were addressed and answered.   PAIN:  Are you having pain? Yes: NPRS scale: 4-5/10 Pain location: left lateral knee  Pain description: pressure    Aggravating factors: steps Relieving factors: ice  PRECAUTIONS: None  WEIGHT BEARING RESTRICTIONS: No  FALLS:  Has patient fallen in last 6 months? 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, lots of seated work at [place]: [place], [place] with basic ADLs, Independent with gait, and Independent with transfers  PATIENT GOALS: be able to walk better, bend both knees   NEXT MD VISIT: 03/27/22   OBJECTIVE:  PATIENT SURVEYS:  02/20/22: FOTO 67  EVAL: FOTO 53, predicted 60   SENSATION: N/T around incision as appropriate post-op, no signs of drainage/erythema/infection at incision looking good   EDEMA:  As appropriate post-op  LOWER EXTREMITY ROM:  Active ROM Left eval Left 02/08/22 Left 02/13/22 Left 02/16/22 Left 02/20/22 Left 02/22/22  Knee flexion 59* supine heel slide  A: 87 supine P:91 A: 93 AA: 97 A: 97 AA: 101 AA: 100 A: 98 AA: 104   Knee  extension 12* supine with heel prop  -5  (Seated LAQ)    A: 0 (seated LAQ)   (Blank rows = not tested)  LOWER EXTREMITY MMT:  MMT Right eval Left eval  Knee flexion 4+ 4  Knee extension 5 3 limited by pain    (Blank rows = not tested)  LOWER EXTREMITY SPECIAL TESTS:    FUNCTIONAL TESTS:  EVAL: 5 times sit to stand: 14.7 seconds use of BUEs  Timed up and go (TUG): 17.4 seconds RW  3MWT:   GAIT: Distance walked: 572f  Assistive device utilized: WEnvironmental consultant- 2 wheeled Level of assistance: Modified independence Comments: limited knee flexion B, flexed at hips    TODAY'S TREATMENT:  DATE:    03/01/22  TherEx  Nustep for knee flexion stretch (bike not available) x6 minutes  Knee flexion stretch 10x10 second holds 8 inch box Forward and lateral step ups x10 each U UE support 6 inch box   Manual  Scar massage cross friction, parallel to scar line, CW and CCW  Patella mobs all directions grade 2-3   NMR  Tandem stance 3x30 seconds B solid surface Narrow BOS EC solid surface 3x30 seconds     02/27/22 TherEx Recumbent bike seat 7 x 8 min; full revolutions Leg Press 118# 3x10 bil; 68# 3x10 LLE only Calf raises 3x10 with light UE support Forward step ups onto 4" step x10 bil Forward step downs from 4" step x 10 bil  Neuro Re-Ed Side stepping on balance beam x 5 laps in // bars with intermittent UE support Tandem walking on balance beam x 5 laps in // bars; light UE support needed Tandem stand on foam beam 30 sec x 3 reps bil; intermittent UE support  Modalities Vaso x 10 min to Lt knee, mod pressure; 34 deg  02/22/22 TherEx Recumbent bike seat 7 x 8 min; partial revolutions Foot on 4" step with heel taps to floor x 15 reps bil Seated AA Lt knee flexion 10 x 10 sec hold ROM measurements - see above Knee extension 5# LLE only 3x10 Knee  flexion 15# LLE only 3x10  Modalities Vaso x 10 min to Lt knee, mod pressure; 34 deg   02/20/22 TherEx Scifit bike seat 10 x 8 min; L3.2 Knee extension 5# 3x10 LLE only Knee flexion 15# 3x10 LLE only Leg Press 112# 3x10 bil; then 56# on Lt  AA Lt knee flexion seated 10 x 10 sec hold; RLE providing overpressure  Modalities Vaso x 10 min to Lt knee, mod pressure; 34 deg  02/16/22 TherEx Scifit bike seat 10 x 8 min; L2.0 LLE knee extension on cable 5# 3x10; LLE knee flexion 15# 3x10 Leg Press 106# 3x10 bil; then 50# on Lt  Slantboard stretch 3x30 sec Calf raises on slantboard x 20 reps Seated AA Lt knee flexion 10 x 10 sec hold; RLE providing overpressure Supine AA heel slides with strap x 10 reps  Modalities Vaso x 10 min to Lt knee, mod pressure; 34 deg  02/15/22 Measured LE lengths per pt request today- from midline patella to top of medial malleolus 41.5cm RLE, 41.25cm L LE consistent with observations with gait, discussed use of heel lift   TherEx  Nustep for flexion stretch L4 X6 minutes (bikes unavailable) LAQs 7# x10 with 3 second lifts Standing TKEs no resistance x20 3 second holds did well isolating quad Standing HS curls 0# x15 Standing hip hikes x12 B Gait training in clinic distances no device, cues for heel toe pattern and knee flexion  MET for quad + immediate knee flexion AAROM at edge of mat table    PATIENT EDUCATION:  Education details: exam findings, POC Person educated: Patient Education method: Customer service manager Education comprehension: verbalized understanding  HOME EXERCISE PROGRAM:  Access Code: [email or web address] URL: [email or web address] Date: 03/01/2022 Prepared by: Deniece Ree  Exercises - Standing Knee Flexion Stretch on Step  - 2 x daily - 7 x weekly - 1 sets - 10 reps - 10 hold - Anterior Knee Scar Massage  - 3 x daily - 7 x weekly - 1 sets - 10 reps - 4 Way Patellar Glide  - 3 x daily - 7 x weekly - 1 sets -  10  reps - Forward Step Up  - 2 x daily - 7 x weekly - 1 sets - 10 reps - Lateral Step Up with Unilateral Counter Support  - 2 x daily - 7 x weekly - 1 sets - 10 reps  ASSESSMENT:  CLINICAL IMPRESSION:  Yasmim arrives doing well, we continued to work on ROM, strength, and balance activities with an eye towards upcoming DC. She is planning to get back to walking around the track when the weather gets a little better as well. She has 2 more visits left, on track for DC next week. Started building advanced HEP for home use after discharge today as well.     OBJECTIVE IMPAIRMENTS: Abnormal gait, decreased activity tolerance, decreased balance, decreased knowledge of use of DME, decreased mobility, difficulty walking, decreased ROM, decreased strength, hypomobility, increased edema, impaired flexibility, obesity, and pain.   ACTIVITY LIMITATIONS: sitting, standing, squatting, stairs, transfers, bed mobility, and locomotion level  PARTICIPATION LIMITATIONS: driving, shopping, community activity, occupation, and yard work  PERSONAL FACTORS: Age, Fitness, Past/current experiences, and Social background are also affecting patient's functional outcome.   REHAB POTENTIAL: Excellent  CLINICAL DECISION MAKING: Stable/uncomplicated  EVALUATION COMPLEXITY: Low   GOALS: Goals reviewed with patient? Yes  SHORT TERM GOALS: Target date: 02/20/2022   Will be compliant with appropriate progressive HEP  Baseline: Goal status: MET 02/22/22  2.  Pain to be no more than 3/10 at worst  Baseline:  Goal status: ongoing 02/22/22  3.  Will be able to ambulate with SPC and normalized gait mechanics household distances no increase in pain  Baseline:  Goal status: MET 02/22/22 (no AD)  4.  Knee extension AROM to be no more than 5*, flexion AROM to be no less than 90* Baseline:  Goal status: MET 02/22/22    LONG TERM GOALS: Target date: 03/13/2022    MMT to have improved by at least 1 grade in all weak  groups  Baseline:  Goal status: INITIAL  2.  Knee flexion AROM to be no less than 105* Baseline:  Goal status: INITIAL  3.  Will be able to ambulate at least 1035f with no device and good gait mechanics  Baseline:  Goal status: INITIAL  4.  Will be able to ascend/descend 15 steps with U rail, reciprocal pattern and no increase in pain  Baseline:  Goal status: INITIAL     PLAN:  PT FREQUENCY:  3x/week for first 3 weeks, then 2x/week for next 3 weeks (6 weeks total)  PT DURATION: 6 weeks  PLANNED INTERVENTIONS: Therapeutic exercises, Therapeutic activity, Neuromuscular re-education, Balance training, Gait training, Patient/Family education, Self Care, Joint mobilization, Stair training, DME instructions, Dry Needling, Electrical stimulation, Cryotherapy, Moist heat, Taping, Ultrasound, Ionotophoresis 427mml Dexamethasone, Manual therapy, and Re-evaluation  PLAN FOR NEXT SESSION:  review/update HEP, work on any final concerns for d/c, ROM and strength, balance  KrDeniece ReeT DPT PN2

## 2022-03-02 ENCOUNTER — Other Ambulatory Visit: Payer: Self-pay | Admitting: Orthopaedic Surgery

## 2022-03-02 MED ORDER — OXYCODONE HCL 5 MG PO TABS: 5.0000 mg | ORAL_TABLET | Freq: Four times a day (QID) | ORAL | 0 refills | Status: DC | PRN

## 2022-03-06 ENCOUNTER — Other Ambulatory Visit: Payer: Self-pay | Admitting: Medical

## 2022-03-06 ENCOUNTER — Ambulatory Visit: Payer: 59 | Admitting: Physical Therapy

## 2022-03-06 ENCOUNTER — Encounter: Payer: Self-pay | Admitting: Physical Therapy

## 2022-03-06 DIAGNOSIS — R262 Difficulty in walking, not elsewhere classified: Secondary | ICD-10-CM

## 2022-03-06 DIAGNOSIS — M25662 Stiffness of left knee, not elsewhere classified: Secondary | ICD-10-CM

## 2022-03-06 DIAGNOSIS — M25562 Pain in left knee: Secondary | ICD-10-CM

## 2022-03-06 DIAGNOSIS — M6281 Muscle weakness (generalized): Secondary | ICD-10-CM

## 2022-03-06 NOTE — Therapy (Signed)
OUTPATIENT PHYSICAL THERAPY LOWER EXTREMITY TREATMENT   Patient Name: Kayla Larsen MRN: [id number] DOB:01-Nov-1970, 52 y.o., female Today's Date: 03/06/2022  END OF SESSION:  PT End of Session - 03/06/22 1528     Visit Number 14    Number of Visits 16    Date for PT Re-Evaluation 03/13/22    Authorization Type [employer]    Authorization Time Period 01/30/22 to 03/27/22    PT Start Time 1520    PT Stop Time 1558    PT Time Calculation (min) 38 min    Activity Tolerance Patient tolerated treatment well    Behavior During Therapy [facility] for tasks assessed/performed                         Past Medical History:  Diagnosis Date   Acanthosis nigricans    Anemia    Arthritis of knee    History of blood transfusion 2009   2 units after hysterectomy   Hypertension    Obesity    Wears glasses    Past Surgical History:  Procedure Laterality Date   CESAREAN SECTION     x 2   COLONOSCOPY  2009   done for anemia eval; Dr. Benson [place]   COLONOSCOPY  01/25/2018   multiple aphthous ulcers in terminal ileum likely d/t NSAIDs, diverticulosis in sigmoid colon, Dr. [facility]   KNEE SURGERY  10/2015   knot removed from R knee   LAPAROTOMY Left 07/30/2012   Procedure: LEFT SALPINGO OOPHERECTOMY,  OMENTECTOMY, AND LYSIS OF ADHESIONS;  Surgeon: Alvino Chapel, MD;  Location: [facility];  Service: Gynecology;  Laterality: Left;   OOPHORECTOMY  2014   left    TOTAL ABDOMINAL HYSTERECTOMY  2009   TOTAL KNEE ARTHROPLASTY Right 01/14/2021   Procedure: Right TOTAL KNEE ARTHROPLASTY;  Surgeon: [name], MD;  Location: [facility];  Service: Orthopedics;  Laterality: Right;  RNFA   TOTAL KNEE ARTHROPLASTY Left 01/13/2022   Procedure: LEFT TOTAL KNEE ARTHROPLASTY;  Surgeon: [name], MD;  Location: [facility];  Service: Orthopedics;  Laterality: Left;   Patient Active Problem List   Diagnosis Date Noted   Status post total left knee replacement 01/13/2022    BMI 40.0-44.9, adult ([place]) 01/05/2022   Unilateral primary osteoarthritis, left knee 10/31/2021   Status post total right knee replacement 01/16/2021   Encounter for health maintenance examination in adult 12/27/2020   Screening for heart disease 12/27/2020   Iron deficiency 12/27/2020   Screening for diabetes mellitus 12/27/2020   BMI 38.0-38.9,adult 12/27/2020   Hypokalemia 12/27/2020   Primary osteoarthritis of right knee 11/16/2020   H/O: hysterectomy 09/21/2020   Vaccine counseling 11/20/2019   Encounter for screening mammogram for malignant neoplasm of breast 11/20/2019   Gout of foot 11/20/2019   Primary osteoarthritis of both knees 05/14/2019   Diverticulosis 11/19/2018   Vitamin D deficiency 11/24/2016   Overactive bladder 05/03/2015   Essential hypertension 05/03/2015   Arthritis, senescent 05/03/2015   Hypertriglyceridemia 05/03/2015   Impaired fasting blood sugar 05/03/2015    PCP: Jacklynn Barnacle   REFERRING PROVIDER: [name], MD  REFERRING DIAG: [phone] (ICD-10-CM) - Status post total left knee replacement  THERAPY DIAG:  Acute pain of left knee  Muscle weakness (generalized)  Stiffness of left knee, not elsewhere classified  Difficulty in walking, not elsewhere classified  Rationale for Evaluation and Treatment: Rehabilitation  ONSET DATE: 01/26/2022  SUBJECTIVE:   SUBJECTIVE STATEMENT:  I  think I slept funny, having pain in B knees, exercises are going well. Trying to sleep in the bed but its not working.   PERTINENT HISTORY: The patient comes in today for first postoperative visit status post a left total knee arthroplasty.  We have replaced her right knee remotely.  She is ambulate with a walker.  She does report significant left foot and ankle swelling.  She has been wearing compressive garments but not regularly.  She has been compliant with a baby aspirin twice daily.   The staples are removed from the left knee incision  and Steri-Strips have been placed.  Her calf is soft.  Her foot and ankle is swollen but she can pump her foot back-and-forth and I want her to keep doing that as well as elevation and compressive garment to help with swelling.   I will send in some more pain medication for her.  We will get her set up for outpatient physical therapy hopefully upstairs here for further evaluation and treatment status post a left knee replacement.  From my standpoint I will see her back in 4 weeks for repeat exam but no x-rays are needed.  All questions and concerns were addressed and answered.   PAIN:  Are you having pain? Yes: NPRS scale: 4-5/10 Pain location: left lateral knee  Pain description: pressure    Aggravating factors: steps Relieving factors: ice  PRECAUTIONS: None  WEIGHT BEARING RESTRICTIONS: No  FALLS:  Has patient fallen in last 6 months? 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, lots of seated work at [place]: [place], [place] with basic ADLs, Independent with gait, and Independent with transfers  PATIENT GOALS: be able to walk better, bend both knees   NEXT MD VISIT: 03/27/22   OBJECTIVE:  PATIENT SURVEYS:  02/20/22: FOTO 67  EVAL: FOTO 53, predicted 73   SENSATION: N/T around incision as appropriate post-op, no signs of drainage/erythema/infection at incision looking good   EDEMA:  As appropriate post-op  LOWER EXTREMITY ROM:  Active ROM Left eval Left 02/08/22 Left 02/13/22 Left 02/16/22 Left 02/20/22 Left 02/22/22  Knee flexion 59* supine heel slide  A: 87 supine P:91 A: 93 AA: 97 A: 97 AA: 101 AA: 100 A: 98 AA: 104   Knee extension 12* supine with heel prop  -5  (Seated  LAQ)    A: 0 (seated LAQ)   (Blank rows = not tested)  LOWER EXTREMITY MMT:  MMT Right eval Left eval  Knee flexion 4+ 4  Knee extension 5 3 limited by pain    (Blank rows = not tested)  LOWER EXTREMITY SPECIAL TESTS:    FUNCTIONAL TESTS:  EVAL: 5 times sit to stand: 14.7 seconds use of BUEs  Timed up and go (TUG): 17.4 seconds RW  3MWT:   GAIT: Distance walked: 568f  Assistive device utilized: WEnvironmental consultant- 2 wheeled Level of assistance: Modified independence Comments: limited knee flexion B, flexed at hips    TODAY'S TREATMENT:  DATE:   03/06/22  TherEx  SciFit bike Seat 8 --> seat 7 x 6 minutes for ROM Self knee flexion stretch 10x10 seconds HS stretch L LE 3x30 seconds  Door squats x10  Reviewed self patella mobs/hand placement  SAQs 7# x12 5 second holds Bridges x15 ABD  L LE 2# x15   NMR  Tandem stance 3x30 seconds B solid surface    03/01/22  TherEx  Nustep for knee flexion stretch (bike not available) x6 minutes  Knee flexion stretch 10x10 second holds 8 inch box Forward and lateral step ups x10 each U UE support 6 inch box   Manual  Scar massage cross friction, parallel to scar line, CW and CCW  Patella mobs all directions grade 2-3   NMR  Tandem stance 3x30 seconds B solid surface Narrow BOS EC solid surface 3x30 seconds     02/27/22 TherEx Recumbent bike seat 7 x 8 min; full revolutions Leg Press 118# 3x10 bil; 68# 3x10 LLE only Calf raises 3x10 with light UE support Forward step ups onto 4" step x10 bil Forward step downs from 4" step x 10 bil  Neuro Re-Ed Side stepping on balance beam x 5 laps in // bars with intermittent UE support Tandem walking on balance beam x 5 laps in // bars; light UE support needed Tandem stand on foam beam 30 sec x 3 reps bil; intermittent UE support  Modalities Vaso x 10 min  to Lt knee, mod pressure; 34 deg  02/22/22 TherEx Recumbent bike seat 7 x 8 min; partial revolutions Foot on 4" step with heel taps to floor x 15 reps bil Seated AA Lt knee flexion 10 x 10 sec hold ROM measurements - see above Knee extension 5# LLE only 3x10 Knee flexion 15# LLE only 3x10  Modalities Vaso x 10 min to Lt knee, mod pressure; 34 deg   02/20/22 TherEx Scifit bike seat 10 x 8 min; L3.2 Knee extension 5# 3x10 LLE only Knee flexion 15# 3x10 LLE only Leg Press 112# 3x10 bil; then 56# on Lt  AA Lt knee flexion seated 10 x 10 sec hold; RLE providing overpressure  Modalities Vaso x 10 min to Lt knee, mod pressure; 34 deg  02/16/22 TherEx Scifit bike seat 10 x 8 min; L2.0 LLE knee extension on cable 5# 3x10; LLE knee flexion 15# 3x10 Leg Press 106# 3x10 bil; then 50# on Lt  Slantboard stretch 3x30 sec Calf raises on slantboard x 20 reps Seated AA Lt knee flexion 10 x 10 sec hold; RLE providing overpressure Supine AA heel slides with strap x 10 reps  Modalities Vaso x 10 min to Lt knee, mod pressure; 34 deg  02/15/22 Measured LE lengths per pt request today- from midline patella to top of medial malleolus 41.5cm RLE, 41.25cm L LE consistent with observations with gait, discussed use of heel lift   TherEx  Nustep for flexion stretch L4 X6 minutes (bikes unavailable) LAQs 7# x10 with 3 second lifts Standing TKEs no resistance x20 3 second holds did well isolating quad Standing HS curls 0# x15 Standing hip hikes x12 B Gait training in clinic distances no device, cues for heel toe pattern and knee flexion  MET for quad + immediate knee flexion AAROM at edge of mat table    PATIENT EDUCATION:  Education details: exam findings, POC Person educated: Patient Education method: Customer service manager Education comprehension: verbalized understanding  HOME EXERCISE PROGRAM:  Access Code: [email or web address] URL: [email or web address] Date:  03/06/2022 Prepared  by: Deniece Ree  Exercises - Standing Knee Flexion Stretch on Step  - 2 x daily - 7 x weekly - 1 sets - 10 reps - 10 hold - Anterior Knee Scar Massage  - 3 x daily - 7 x weekly - 1 sets - 10 reps - 4 Way Patellar Glide  - 3 x daily - 7 x weekly - 1 sets - 10 reps - Forward Step Up  - 2 x daily - 7 x weekly - 1 sets - 10 reps - Lateral Step Up with Unilateral Counter Support  - 2 x daily - 7 x weekly - 1 sets - 10 reps - Seated Knee Flexion AAROM  - 1 x daily - 7 x weekly - 3 sets - 10 reps - Seated Hamstring Stretch  - 1 x daily - 7 x weekly - 3 sets - 10 reps - Wall Squat  - 1 x daily - 7 x weekly - 3 sets - 10 reps - Tandem Stance  - 1 x daily - 7 x weekly - 3 sets - 10 reps  ASSESSMENT:  CLINICAL IMPRESSION:  Zaelia arrives today doing OK, still seems to be making progress. Continued to encourage her to ice at home, otherwise continued working on ROM, strength and balance. Tried working on some balance but had increased knee pain today, modified session and focused on open chain strengthening instead. Updated HEP again as well. We will plan on getting objective measures for re-assessment and likely DC next session.     OBJECTIVE IMPAIRMENTS: Abnormal gait, decreased activity tolerance, decreased balance, decreased knowledge of use of DME, decreased mobility, difficulty walking, decreased ROM, decreased strength, hypomobility, increased edema, impaired flexibility, obesity, and pain.   ACTIVITY LIMITATIONS: sitting, standing, squatting, stairs, transfers, bed mobility, and locomotion level  PARTICIPATION LIMITATIONS: driving, shopping, community activity, occupation, and yard work  PERSONAL FACTORS: Age, Fitness, Past/current experiences, and Social background are also affecting patient's functional outcome.   REHAB POTENTIAL: Excellent  CLINICAL DECISION MAKING: Stable/uncomplicated  EVALUATION COMPLEXITY: Low   GOALS: Goals reviewed with patient?  Yes  SHORT TERM GOALS: Target date: 02/20/2022   Will be compliant with appropriate progressive HEP  Baseline: Goal status: MET 02/22/22  2.  Pain to be no more than 3/10 at worst  Baseline:  Goal status: ongoing 02/22/22  3.  Will be able to ambulate with SPC and normalized gait mechanics household distances no increase in pain  Baseline:  Goal status: MET 02/22/22 (no AD)  4.  Knee extension AROM to be no more than 5*, flexion AROM to be no less than 90* Baseline:  Goal status: MET 02/22/22    LONG TERM GOALS: Target date: 03/13/2022    MMT to have improved by at least 1 grade in all weak groups  Baseline:  Goal status: INITIAL  2.  Knee flexion AROM to be no less than 105* Baseline:  Goal status: INITIAL  3.  Will be able to ambulate at least 1066f with no device and good gait mechanics  Baseline:  Goal status: INITIAL  4.  Will be able to ascend/descend 15 steps with U rail, reciprocal pattern and no increase in pain  Baseline:  Goal status: INITIAL     PLAN:  PT FREQUENCY:  3x/week for first 3 weeks, then 2x/week for next 3 weeks (6 weeks total)  PT DURATION: 6 weeks  PLANNED INTERVENTIONS: Therapeutic exercises, Therapeutic activity, Neuromuscular re-education, Balance training, Gait training, Patient/Family education, Self Care, Joint mobilization, Stair  training, DME instructions, Dry Needling, Electrical stimulation, Cryotherapy, Moist heat, Taping, Ultrasound, Ionotophoresis '4mg'$ /ml Dexamethasone, Manual therapy, and Re-evaluation  PLAN FOR NEXT SESSION:  review/update HEP, work on any final concerns for d/c, ROM and strength, balance  Deniece Ree PT DPT PN2

## 2022-03-07 ENCOUNTER — Other Ambulatory Visit: Payer: Self-pay | Admitting: Orthopaedic Surgery

## 2022-03-08 ENCOUNTER — Ambulatory Visit: Payer: 59 | Admitting: Physical Therapy

## 2022-03-08 ENCOUNTER — Encounter: Payer: Self-pay | Admitting: Physical Therapy

## 2022-03-08 ENCOUNTER — Other Ambulatory Visit: Payer: Self-pay | Admitting: Medical

## 2022-03-08 DIAGNOSIS — R262 Difficulty in walking, not elsewhere classified: Secondary | ICD-10-CM | POA: Diagnosis not present

## 2022-03-08 DIAGNOSIS — M25562 Pain in left knee: Secondary | ICD-10-CM

## 2022-03-08 DIAGNOSIS — M6281 Muscle weakness (generalized): Secondary | ICD-10-CM | POA: Diagnosis not present

## 2022-03-08 DIAGNOSIS — M25662 Stiffness of left knee, not elsewhere classified: Secondary | ICD-10-CM

## 2022-03-08 NOTE — Therapy (Signed)
OUTPATIENT PHYSICAL THERAPY LOWER EXTREMITY TREATMENT/DISCHARGE   Patient Name: Kayla Larsen MRN: [id number] DOB:23-Feb-1970, 52 y.o., female Today's Date: 03/08/2022   PHYSICAL THERAPY DISCHARGE SUMMARY  Visits from Start of Care: 15  Current functional level related to goals / functional outcomes: See below    Remaining deficits: See below    Education / Equipment: See below    Patient agrees to discharge. Patient goals were partially met. Patient is being discharged due to being pleased with the current functional level.   END OF SESSION:  PT End of Session - 03/08/22 1526     Visit Number 15    Number of Visits 15    Date for PT Re-Evaluation 03/13/22    Authorization Type [employer]    Authorization Time Period 01/30/22 to 03/27/22    PT Start Time 1518    PT Stop Time 1558    PT Time Calculation (min) 40 min    Activity Tolerance Patient tolerated treatment well    Behavior During Therapy [facility] for tasks assessed/performed                          Past Medical History:  Diagnosis Date   Acanthosis nigricans    Anemia    Arthritis of knee    History of blood transfusion 2009   2 units after hysterectomy   Hypertension    Obesity    Wears glasses    Past Surgical History:  Procedure Laterality Date   CESAREAN SECTION     x 2   COLONOSCOPY  2009   done for anemia eval; Dr. Benson [place]   COLONOSCOPY  01/25/2018   multiple aphthous ulcers in terminal ileum likely d/t NSAIDs, diverticulosis in sigmoid colon, Dr. [place] Cellar   KNEE SURGERY  10/2015   knot removed from R knee   LAPAROTOMY Left 07/30/2012   Procedure: LEFT SALPINGO OOPHERECTOMY,  OMENTECTOMY, AND LYSIS OF ADHESIONS;  Surgeon: Alvino Chapel, MD;  Location: [facility];  Service: Gynecology;  Laterality: Left;   OOPHORECTOMY  2014   left    TOTAL ABDOMINAL HYSTERECTOMY  2009   TOTAL KNEE ARTHROPLASTY Right 01/14/2021   Procedure: Right TOTAL KNEE ARTHROPLASTY;  Surgeon:  [name], MD;  Location: [facility];  Service: Orthopedics;  Laterality: Right;  RNFA   TOTAL KNEE ARTHROPLASTY Left 01/13/2022   Procedure: LEFT TOTAL KNEE ARTHROPLASTY;  Surgeon: [name], MD;  Location: [facility];  Service: Orthopedics;  Laterality: Left;   Patient Active Problem List   Diagnosis Date Noted   Status post total left knee replacement 01/13/2022   BMI 40.0-44.9, adult ([place]) 01/05/2022   Unilateral primary osteoarthritis, left knee 10/31/2021   Status post total right knee replacement 01/16/2021   Encounter for health maintenance examination in adult 12/27/2020   Screening for heart disease 12/27/2020   Iron deficiency 12/27/2020   Screening for diabetes mellitus 12/27/2020   BMI 38.0-38.9,adult 12/27/2020   Hypokalemia 12/27/2020   Primary osteoarthritis of right knee 11/16/2020   H/O: hysterectomy 09/21/2020   Vaccine counseling 11/20/2019   Encounter for screening mammogram for malignant neoplasm of breast 11/20/2019   Gout of foot 11/20/2019   Primary osteoarthritis of both knees 05/14/2019   Diverticulosis 11/19/2018   Vitamin D deficiency 11/24/2016   Overactive bladder 05/03/2015   Essential hypertension 05/03/2015   Arthritis, senescent 05/03/2015   Hypertriglyceridemia 05/03/2015   Impaired fasting blood sugar 05/03/2015    PCP: Jacklynn Barnacle  REFERRING PROVIDER: [name], MD  REFERRING DIAG: [phone] (ICD-10-CM) - Status post total left knee replacement  THERAPY DIAG:  Acute pain of left knee  Muscle weakness (generalized)  Stiffness of left knee, not elsewhere classified  Difficulty in walking, not elsewhere classified  Rationale for Evaluation and Treatment: Rehabilitation  ONSET DATE: 01/26/2022  SUBJECTIVE:   SUBJECTIVE STATEMENT:  I took myself off the oxy, just a few tylenol  PERTINENT HISTORY: The patient comes in today for first postoperative visit status post a left total knee  arthroplasty.  We have replaced her right knee remotely.  She is ambulate with a walker.  She does report significant left foot and ankle swelling.  She has been wearing compressive garments but not regularly.  She has been compliant with a baby aspirin twice daily.   The staples are removed from the left knee incision and Steri-Strips have been placed.  Her calf is soft.  Her foot and ankle is swollen but she can pump her foot back-and-forth and I want her to keep doing that as well as elevation and compressive garment to help with swelling.   I will send in some more pain medication for her.  We will get her set up for outpatient physical therapy hopefully upstairs here for further evaluation and treatment status post a left knee replacement.  From my standpoint I will see her back in 4 weeks for repeat exam but no x-rays are needed.  All questions and concerns were addressed and answered.   PAIN:  Are you having pain? Yes: NPRS scale: 4-5/10 Pain location: left lateral knee  Pain description: pressure    Aggravating factors: steps Relieving factors: ice  PRECAUTIONS: None  WEIGHT BEARING RESTRICTIONS: No  FALLS:  Has patient fallen in last 6 months? 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, lots of seated work at [place]: [place], [place] with basic ADLs, Independent with gait, and Independent with transfers  PATIENT GOALS: be able to walk better, bend both knees   NEXT MD VISIT: 03/27/22   OBJECTIVE:  PATIENT SURVEYS:  02/20/22: FOTO 67  EVAL: FOTO 53, predicted 52   SENSATION: N/T around incision as appropriate post-op, no signs of drainage/erythema/infection at incision looking good    EDEMA:  As appropriate post-op  LOWER EXTREMITY ROM:  Active ROM Left eval Left 02/08/22 Left 02/13/22 Left 02/16/22 Left 02/20/22 Left 02/22/22 Left 03/08/22  Knee flexion 59* supine heel slide  A: 87 supine P:91 A: 93 AA: 97 A: 97 AA: 101 AA: 100 A: 98 AA: 104  A: 101* supine   Knee extension 12* supine with heel prop  -5  (Seated LAQ)    A: 0 (seated LAQ) A: -7*    (Blank rows = not tested)  LOWER EXTREMITY MMT:  MMT Right eval Left eval Right 03/08/22 Left 03/08/22  Knee flexion 4+ '4 5 5  '$ Knee extension 5 3 limited by pain  5 4+   (Blank rows = not tested)  LOWER EXTREMITY SPECIAL TESTS:    FUNCTIONAL TESTS:  EVAL: 5 times sit to stand: 14.7 seconds use of BUEs  Timed up and go (TUG): 17.4 seconds RW  3MWT:   GAIT: Distance walked:  557f  Assistive device utilized: WEnvironmental consultant- 2 wheeled Level of assistance: Modified independence Comments: limited knee flexion B, flexed at hips    TODAY'S TREATMENT:                                                                                                                              DATE:   03/08/22  Objective measures/DC measures and goal review, appropriate education  TherEx  SciFit bike for warmup/ROM prior to measures x6 minutes seat 7 Knee flexion stretch 8 inch box 10x10 second holds   NMR  SLS on 4 inch box 3x30 seconds B Tandem gait in // bars light U UE support x6 laps  Backwards walking in // bars x4 laps  Narrow BOS on blue foam pad 3x30 seconds    03/06/22  TherEx  SciFit bike Seat 8 --> seat 7 x 6 minutes for ROM Self knee flexion stretch 10x10 seconds HS stretch L LE 3x30 seconds  Door squats x10  Reviewed self patella mobs/hand placement  SAQs 7# x12 5 second holds Bridges x15 ABD  L LE 2# x15   NMR  Tandem stance 3x30 seconds B solid surface    03/01/22  TherEx  Nustep for knee flexion stretch (bike not available) x6 minutes  Knee flexion stretch 10x10 second holds 8 inch  box Forward and lateral step ups x10 each U UE support 6 inch box   Manual  Scar massage cross friction, parallel to scar line, CW and CCW  Patella mobs all directions grade 2-3   NMR  Tandem stance 3x30 seconds B solid surface Narrow BOS EC solid surface 3x30 seconds     02/27/22 TherEx Recumbent bike seat 7 x 8 min; full revolutions Leg Press 118# 3x10 bil; 68# 3x10 LLE only Calf raises 3x10 with light UE support Forward step ups onto 4" step x10 bil Forward step downs from 4" step x 10 bil  Neuro Re-Ed Side stepping on balance beam x 5 laps in // bars with intermittent UE support Tandem walking on balance beam x 5 laps in // bars; light UE support needed Tandem stand on foam beam 30 sec x 3 reps bil; intermittent UE support  Modalities Vaso x 10 min to Lt knee, mod pressure; 34 deg  02/22/22 TherEx Recumbent bike seat 7 x 8 min; partial revolutions Foot on 4" step with heel taps to floor x 15 reps bil Seated AA Lt knee flexion 10 x 10 sec hold ROM measurements - see above Knee extension 5# LLE only 3x10 Knee flexion 15# LLE only 3x10  Modalities Vaso x 10 min to Lt knee, mod pressure; 34 deg   02/20/22 TherEx Scifit bike seat 10 x 8 min; L3.2 Knee extension 5# 3x10 LLE only Knee flexion 15# 3x10 LLE only Leg Press 112# 3x10 bil; then 56# on Lt  AA Lt knee flexion seated 10 x 10 sec hold; RLE providing overpressure  Modalities Vaso x  10 min to Lt knee, mod pressure; 34 deg  02/16/22 TherEx Scifit bike seat 10 x 8 min; L2.0 LLE knee extension on cable 5# 3x10; LLE knee flexion 15# 3x10 Leg Press 106# 3x10 bil; then 50# on Lt  Slantboard stretch 3x30 sec Calf raises on slantboard x 20 reps Seated AA Lt knee flexion 10 x 10 sec hold; RLE providing overpressure Supine AA heel slides with strap x 10 reps  Modalities Vaso x 10 min to Lt knee, mod pressure; 34 deg  02/15/22 Measured LE lengths per pt request today- from midline patella to top of medial  malleolus 41.5cm RLE, 41.25cm L LE consistent with observations with gait, discussed use of heel lift   TherEx  Nustep for flexion stretch L4 X6 minutes (bikes unavailable) LAQs 7# x10 with 3 second lifts Standing TKEs no resistance x20 3 second holds did well isolating quad Standing HS curls 0# x15 Standing hip hikes x12 B Gait training in clinic distances no device, cues for heel toe pattern and knee flexion  MET for quad + immediate knee flexion AAROM at edge of mat table    PATIENT EDUCATION:  Education details: exam findings, POC Person educated: Patient Education method: Customer service manager Education comprehension: verbalized understanding  HOME EXERCISE PROGRAM:  Access Code: [email or web address] URL: [email or web address] Date: 03/08/2022 Prepared by: Deniece Ree  Exercises - Standing Knee Flexion Stretch on Step  - 2 x daily - 7 x weekly - 1 sets - 10 reps - 10 hold - Anterior Knee Scar Massage  - 3 x daily - 7 x weekly - 1 sets - 10 reps - 4 Way Patellar Glide  - 3 x daily - 7 x weekly - 1 sets - 10 reps - Forward Step Up  - 2 x daily - 7 x weekly - 1 sets - 10 reps - Lateral Step Up with Unilateral Counter Support  - 2 x daily - 7 x weekly - 1 sets - 10 reps - Seated Knee Flexion AAROM  - 1 x daily - 7 x weekly - 3 sets - 10 reps - Seated Hamstring Stretch  - 1 x daily - 7 x weekly - 3 sets - 10 reps - Wall Squat  - 1 x daily - 7 x weekly - 3 sets - 10 reps - Tandem Stance  - 1 x daily - 7 x weekly - 3 sets - 10 reps - Single Leg Stance  - 1 x daily - 7 x weekly - 3 sets - 10 reps - Walking Tandem Stance  - 1 x daily - 7 x weekly - 3 sets - 10 reps - Backwards Walking  - 1 x daily - 7 x weekly - 3 sets - 10 reps - Standing Narrow Base of Support  - 1 x daily - 7 x weekly - 3 sets - 10 reps  ASSESSMENT:  CLINICAL IMPRESSION:  Estrellita arrives today doing OK, we got updated measures and goal review. Really has made great progress with therapy, seems  ready for DC. Updated HEP a bit to include more balance exercises as well- DC today, thank you for the referral. She is welcome to return with any new MSK issues with new referral.    OBJECTIVE IMPAIRMENTS: Abnormal gait, decreased activity tolerance, decreased balance, decreased knowledge of use of DME, decreased mobility, difficulty walking, decreased ROM, decreased strength, hypomobility, increased edema, impaired flexibility, obesity, and pain.   ACTIVITY LIMITATIONS: sitting, standing, squatting, stairs, transfers, bed mobility, and locomotion  level  PARTICIPATION LIMITATIONS: driving, shopping, community activity, occupation, and yard work  PERSONAL FACTORS: Age, Fitness, Past/current experiences, and Social background are also affecting patient's functional outcome.   REHAB POTENTIAL: Excellent  CLINICAL DECISION MAKING: Stable/uncomplicated  EVALUATION COMPLEXITY: Low   GOALS: Goals reviewed with patient? Yes  SHORT TERM GOALS: Target date: 02/20/2022   Will be compliant with appropriate progressive HEP  Baseline: Goal status: MET 03/08/22  2.  Pain to be no more than 3/10 at worst  Baseline:  Goal status: NOT MET 02/22/22- can get to 4-5/10 occasionally   3.  Will be able to ambulate with SPC and normalized gait mechanics household distances no increase in pain  Baseline:  Goal status: MET 02/22/22 (no AD)  4.  Knee extension AROM to be no more than 5*, flexion AROM to be no less than 90* Baseline:  Goal status:  PARTIALLY MET 02/22/22    LONG TERM GOALS: Target date: 03/13/2022    MMT to have improved by at least 1 grade in all weak groups  Baseline:  Goal status: MET 03/08/22  2.  Knee flexion AROM to be no less than 105* Baseline:  Goal status: NOT MET 03/08/22 102-103* at best   3.  Will be able to ambulate at least 1079f with no device and good gait mechanics  Baseline:  Goal status: MET 03/08/22  4.  Will be able to ascend/descend 15 steps with U rail,  reciprocal pattern and no increase in pain  Baseline:  Goal status: MET 03/08/22     PLAN:  PT FREQUENCY:  3x/week for first 3 weeks, then 2x/week for next 3 weeks (6 weeks total)  PT DURATION: 6 weeks  PLANNED INTERVENTIONS: Therapeutic exercises, Therapeutic activity, Neuromuscular re-education, Balance training, Gait training, Patient/Family education, Self Care, Joint mobilization, Stair training, DME instructions, Dry Needling, Electrical stimulation, Cryotherapy, Moist heat, Taping, Ultrasound, Ionotophoresis '4mg'$ /ml Dexamethasone, Manual therapy, and Re-evaluation  PLAN FOR NEXT SESSION:  DC today   KDeniece ReePT DPT PN2

## 2022-03-09 ENCOUNTER — Other Ambulatory Visit (HOSPITAL_COMMUNITY): Payer: Self-pay

## 2022-03-09 ENCOUNTER — Telehealth: Payer: Self-pay | Admitting: Orthopaedic Surgery

## 2022-03-09 ENCOUNTER — Other Ambulatory Visit: Payer: Self-pay | Admitting: Orthopaedic Surgery

## 2022-03-09 MED ORDER — HYDROCODONE-ACETAMINOPHEN 5-325 MG PO TABS: 1.0000 | ORAL_TABLET | Freq: Four times a day (QID) | ORAL | 0 refills | Status: DC | PRN

## 2022-03-09 NOTE — Telephone Encounter (Signed)
[employer] called asking for an urgent PA for hydrocodone be done. Pt is s/p left knee replacement 01/13/22. Number to call for PA is [phone]

## 2022-03-09 NOTE — Telephone Encounter (Signed)
Patient aware the PA has been done and can take up to 24 hours

## 2022-03-11 ENCOUNTER — Other Ambulatory Visit: Payer: Self-pay | Admitting: Orthopaedic Surgery

## 2022-03-13 ENCOUNTER — Other Ambulatory Visit (HOSPITAL_COMMUNITY): Payer: Self-pay

## 2022-03-14 ENCOUNTER — Ambulatory Visit (INDEPENDENT_AMBULATORY_CARE_PROVIDER_SITE_OTHER): Payer: 59 | Admitting: Medical

## 2022-03-14 ENCOUNTER — Other Ambulatory Visit (HOSPITAL_COMMUNITY): Payer: Self-pay

## 2022-03-14 ENCOUNTER — Other Ambulatory Visit: Payer: Self-pay

## 2022-03-14 VITALS — BP 110/70 | HR 86 | Wt 218.8 lb

## 2022-03-14 DIAGNOSIS — Z6841 Body Mass Index (BMI) 40.0 and over, adult: Secondary | ICD-10-CM | POA: Diagnosis not present

## 2022-03-14 DIAGNOSIS — I10 Essential (primary) hypertension: Secondary | ICD-10-CM

## 2022-03-14 DIAGNOSIS — R7301 Impaired fasting glucose: Secondary | ICD-10-CM | POA: Diagnosis not present

## 2022-03-14 MED ORDER — WEGOVY 1 MG/0.5ML ~~LOC~~ SOAJ
1.0000 mg | SUBCUTANEOUS | 0 refills | Status: DC
  Filled 2022-03-14: qty 2, fill #0
  Filled 2022-03-27: qty 2, 28d supply, fill #0

## 2022-03-14 MED ORDER — WEGOVY 0.5 MG/0.5ML ~~LOC~~ SOAJ
0.5000 mg | SUBCUTANEOUS | 0 refills | Status: DC
  Filled 2022-03-14: qty 2, 28d supply, fill #0

## 2022-03-14 NOTE — Progress Notes (Signed)
Subjective:  Kayla Larsen is a 52 y.o. female who presents for Chief Complaint  Patient presents with   follow-up    Follow-up on weight.       Here for follow up on weight loss efforts.  Had left knee surgery a few weeks ago.  Just finished physical therapy.  She is stil having sweling.  Ortho advised limited walking for the time being . Hasn't been released back to work yet.  Dietary efforts: Less appetite on Wegovy.  Quit sodas, drining water mainly.  Less volume currently.  Medication: Wegovy  Any side effects of medication: none.   No other aggravating or relieving factors.    No other c/o.  Past Medical History:  Diagnosis Date   Acanthosis nigricans    Anemia    Arthritis of knee    History of blood transfusion 2009   2 units after hysterectomy   Hypertension    Obesity    Wears glasses     Current Outpatient Medications on File Prior to Visit  Medication Sig Dispense Refill   allopurinol (ZYLOPRIM) 100 MG tablet Take 1 tablet (100 mg total) by mouth daily. 90 tablet 3   Ascorbic Acid (VITAMIN C PO) Take 2,000 mg by mouth daily.     CRANBERRY PO Take 1 capsule by mouth daily.     diclofenac Sodium (VOLTAREN) 1 % GEL Apply 1 Application topically daily. 100 g 1   ferrous sulfate 325 (65 FE) MG tablet Take 1 tablet (325 mg total) by mouth daily. 90 tablet 0   HYDROcodone-acetaminophen (NORCO/VICODIN) 5-325 MG tablet Take 1-2 tablets by mouth every 6 (six) hours as needed for moderate pain. 30 tablet 0   Multiple Vitamin (MULTIVITAMIN WITH MINERALS) TABS Take 1 tablet by mouth daily. One A Day for Women 50+     Olmesartan-amLODIPine-HCTZ 40-5-12.5 MG TABS TAKE 1 TABLET BY MOUTH DAILY 90 tablet 0   potassium chloride (KLOR-CON) 10 MEQ tablet TAKE 1 TABLET(10 MEQ) BY MOUTH DAILY 90 tablet 0   Semaglutide-Weight Management (WEGOVY) 0.25 MG/0.5ML SOAJ Inject 0.25 mg into the skin once a week. 2 mL 0   tiZANidine (ZANAFLEX) 4 MG tablet TAKE 1 TABLET(4 MG) BY MOUTH  EVERY 8 HOURS AS NEEDED FOR MUSCLE SPASMS 30 tablet 0   tolterodine (DETROL LA) 4 MG 24 hr capsule TAKE 1 CAPSULE(4 MG) BY MOUTH DAILY 90 capsule 0   Cholecalciferol (VITAMIN D) 50 MCG (2000 UT) tablet Take 1 tablet (2,000 Units total) by mouth daily. 90 tablet 3   No current facility-administered medications on file prior to visit.    The following portions of the patient's history were reviewed and updated as appropriate: allergies, current medications, past family history, past medical history, past social history, past surgical history and problem list.  ROS Otherwise as in subjective above   Objective: BP 110/70   Pulse 86   Wt 218 lb 12.8 oz (99.2 kg)   BMI 41.34 kg/m   Wt Readings from Last 3 Encounters:  03/14/22 218 lb 12.8 oz (99.2 kg)  01/13/22 225 lb (102.1 kg)  01/03/22 225 lb (102.1 kg)   General appearance: alert, no distress, well developed, well nourished     Assessment: Encounter Diagnoses  Name Primary?   Essential hypertension Yes   BMI 40.0-44.9, adult ([facility])    Impaired fasting blood sugar      Plan: Discussed efforts so far, medication.   Discussed following recommendations.  Patient Instructions  Kayla Larsen the new 0.'25mg'$  weekly  dose for amount, then go to 0.'5mg'$  weekly dose for a month.  Then begin the '1mg'$  weekly dose.  I want to see you back right after you start the '1mg'$  dose.    Gradually increase exercise as permitted by orthopedics  Monitor BPs.  If you start seeing numbers such as 105/60 or less, then we will have to change your blood pressure medication.  Or if you start feeling dizzy, let me know so we can modify your BP medication.    Consider at 1500 calorie per day diet.      Breakfast You may eat 1 of the following Omelette, which can include a small amount of cheese, and vegetables such as peppers, mushrooms, small pieces of [place] or chicken Low sugar yogurt serving which can include some fruit such as berries Egg whites or  hard boiled egg and meat (1-2 strips of bacon, or small piece of [place] sausage or [place] bacon)   Mid-morning snack 1 fruit serving such as one of the following: medium-sized apple medium-sized orange, Tangerine 1/2 banana  3/4 cup of fresh berries or frozen berries  A protein source such as one of the following: 8 almonds  small handful of walnuts or other nuts small piece of cheese, low sugar yogurt   Lunch A protein source such as 1 of the following: 1 serving of beans such as black beans, pinto beans, green beans, or edamame (soy beans) 1 meat serving such as 6 oz or deck of card size serving of fish, skinless chicken, or [place], either grilled or baked preferably.   You can use some pork or beef, but limit this compared to fish, chicken or [place] Vegetable - Half of your plate should be a non-starchy vegetables!  So avoid white potatoes and corn.  Otherwise, eat a large portion of vegetables. Avocado, cucumber, tomato, carrots, greens, lettuce, squash, okra, etc.  Vegetables can include salad with olive oil/vinaigrette dressing   Mid-afternoon snack 1 fruit serving such as one of the following: medium-sized apple medium-sized orange, Tangerine 1/2 banana  3/4 cup of fresh berries or frozen berries  A protein source such as one of the following: 8 almonds  small handful of walnuts or other nuts small piece of cheese, low sugar yogurt   Dinner A protein source such as 1 of the following: 1 serving of beans such as black beans, pinto beans, green beans, or edamame (soy beans) 1 meat serving such as 6 oz or deck of card size serving of fish, skinless chicken, or [place], either grilled or baked preferably.   You can use some pork or beef, but limit this compared to fish, chicken or [place] Vegetable - Half of your plate should be a non-starchy vegetables!  So avoid white potatoes and corn.  Otherwise, eat a large portion of vegetables. Avocado, cucumber, tomato,  carrots, greens, lettuce, squash, okra, etc.  Vegetables can include salad with olive oil/vinaigrette dressing   Beverages: Water Unsweet tea Home made juice with a juicer without sugar added other than small bit of honey or agave nectar Water with sugar free flavor such as [place]   AVOID.... For the time being I want you to cut out the following items completely: Soda, sweet tea, juice, beer or wine or alcohol ALL grains and breads including rice, pasta, bread, cereal Sweets such as cake, candy, pies, chips, cookies, chocolate   [place] was seen today for follow-up.  Diagnoses and all orders for this visit:  Essential hypertension  BMI  40.0-44.9, adult ([facility])  Impaired fasting blood sugar  Other orders -     Semaglutide-Weight Management (WEGOVY) 0.5 MG/0.5ML SOAJ; Inject 0.5 mg into the skin once a week. -     Semaglutide-Weight Management (WEGOVY) 1 MG/0.5ML SOAJ; Inject 1 mg into the skin once a week.    Follow up: 2 mo

## 2022-03-14 NOTE — Patient Instructions (Addendum)
Finish the new 0.'25mg'$  weekly dose for amount, then go to 0.'5mg'$  weekly dose for a month.  Then begin the '1mg'$  weekly dose.  I want to see you back right after you start the '1mg'$  dose.    Gradually increase exercise as permitted by orthopedics  Monitor BPs.  If you start seeing numbers such as 105/60 or less, then we will have to change your blood pressure medication.  Or if you start feeling dizzy, let me know so we can modify your BP medication.    Consider at 1500 calorie per day diet.      Breakfast You may eat 1 of the following Omelette, which can include a small amount of cheese, and vegetables such as peppers, mushrooms, small pieces of [place] or chicken Low sugar yogurt serving which can include some fruit such as berries Egg whites or hard boiled egg and meat (1-2 strips of bacon, or small piece of [place] sausage or [place] bacon)   Mid-morning snack 1 fruit serving such as one of the following: medium-sized apple medium-sized orange, [place]anger[place] 1/2 banana  3/4 cup of fresh berries or frozen berries  A protein source such as one of the following: 8 almonds  small handful of walnuts or other nuts small piece of cheese, low sugar yogurt   Lunch A protein source such as 1 of the following: 1 serving of beans such as black beans, pinto beans, green beans, or edamame (soy beans) 1 meat serving such as 6 oz or deck of card size serving of fish, skinless chicken, or [place], either grilled or baked preferably.   You can use some pork or beef, but limit this compared to fish, chicken or [place] Vegetable - Half of your plate should be a non-starchy vegetables!  So avoid white potatoes and corn.  Otherwise, eat a large portion of vegetables. Avocado, cucumber, tomato, carrots, greens, lettuce, squash, okra, etc.  Vegetables can include salad with olive oil/vinaigrette dressing   Mid-afternoon snack 1 fruit serving such as one of the following: medium-sized apple medium-sized  orange, [place] 1/2 banana  3/4 cup of fresh berries or frozen berries  A protein source such as one of the following: 8 almonds  small handful of walnuts or other nuts small piece of cheese, low sugar yogurt   Dinner A protein source such as 1 of the following: 1 serving of beans such as black beans, pinto beans, green beans, or edamame (soy beans) 1 meat serving such as 6 oz or deck of card size serving of fish, skinless chicken, or [place], either grilled or baked preferably.   You can use some pork or beef, but limit this compared to fish, chicken or [place] Vegetable - Half of your plate should be a non-starchy vegetables!  So avoid white potatoes and corn.  Otherwise, eat a large portion of vegetables. Avocado, cucumber, tomato, carrots, greens, lettuce, squash, okra, etc.  Vegetables can include salad with olive oil/vinaigrette dressing   Beverages: Water Unsweet tea Home made juice with a juicer without sugar added other than small bit of honey or agave nectar Water with sugar free flavor such as [place]   AVOID.... For the time being I want you to cut out the following items completely: Soda, sweet tea, juice, beer or wine or alcohol ALL grains and breads including rice, pasta, bread, cereal Sweets such as cake, candy, pies, chips, cookies, chocolate

## 2022-03-16 ENCOUNTER — Other Ambulatory Visit: Payer: Self-pay | Admitting: Orthopaedic Surgery

## 2022-03-16 ENCOUNTER — Encounter: Payer: Self-pay | Admitting: Radiology

## 2022-03-16 MED ORDER — HYDROCODONE-ACETAMINOPHEN 5-325 MG PO TABS: 1.0000 | ORAL_TABLET | Freq: Four times a day (QID) | ORAL | 0 refills | Status: DC | PRN

## 2022-03-22 ENCOUNTER — Other Ambulatory Visit: Payer: Self-pay | Admitting: Orthopaedic Surgery

## 2022-03-22 ENCOUNTER — Other Ambulatory Visit: Payer: Self-pay | Admitting: Physician Assistant

## 2022-03-22 MED ORDER — TIZANIDINE HCL 4 MG PO TABS: 4.0000 mg | ORAL_TABLET | Freq: Three times a day (TID) | ORAL | 0 refills | Status: DC | PRN

## 2022-03-22 MED ORDER — HYDROCODONE-ACETAMINOPHEN 5-325 MG PO TABS: 1.0000 | ORAL_TABLET | Freq: Four times a day (QID) | ORAL | 0 refills | Status: DC | PRN

## 2022-03-27 ENCOUNTER — Ambulatory Visit (INDEPENDENT_AMBULATORY_CARE_PROVIDER_SITE_OTHER): Payer: 59 | Admitting: Orthopaedic Surgery

## 2022-03-27 ENCOUNTER — Encounter: Payer: Self-pay | Admitting: Orthopaedic Surgery

## 2022-03-27 ENCOUNTER — Other Ambulatory Visit: Payer: Self-pay

## 2022-03-27 ENCOUNTER — Other Ambulatory Visit: Payer: Self-pay | Admitting: Orthopaedic Surgery

## 2022-03-27 DIAGNOSIS — Z96652 Presence of left artificial knee joint: Secondary | ICD-10-CM

## 2022-03-27 MED ORDER — TIZANIDINE HCL 4 MG PO TABS: 4.0000 mg | ORAL_TABLET | Freq: Three times a day (TID) | ORAL | 0 refills | Status: DC | PRN

## 2022-03-27 MED ORDER — HYDROCODONE-ACETAMINOPHEN 5-325 MG PO TABS: 1.0000 | ORAL_TABLET | Freq: Three times a day (TID) | ORAL | 0 refills | Status: DC | PRN

## 2022-03-27 NOTE — Progress Notes (Signed)
The patient is now almost 3 months status post a left total knee arthroplasty.  She is doing well overall.  She says it feels tight and hurts some.  We replaced her right knee before.  She does need 1 more refill of hydrocodone and a muscle relaxant.  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  On exam both knees have full extension to flexion past 90 degrees.  They both feel ligamentously stable.  I will give her a note to allow her to return to work and refill her hydrocodone and Zanaflex.  Will see her back in 3 months with a standing AP and lateral of her more recent left operative knee.

## 2022-03-28 MED ORDER — TIZANIDINE HCL 4 MG PO TABS: 4.0000 mg | ORAL_TABLET | Freq: Three times a day (TID) | ORAL | 0 refills | Status: DC | PRN

## 2022-04-01 ENCOUNTER — Other Ambulatory Visit: Payer: Self-pay | Admitting: Medical

## 2022-04-01 ENCOUNTER — Other Ambulatory Visit: Payer: Self-pay | Admitting: Physician Assistant

## 2022-04-03 ENCOUNTER — Other Ambulatory Visit: Payer: Self-pay | Admitting: Orthopaedic Surgery

## 2022-04-04 MED ORDER — HYDROCODONE-ACETAMINOPHEN 5-325 MG PO TABS: 1.0000 | ORAL_TABLET | Freq: Three times a day (TID) | ORAL | 0 refills | Status: DC | PRN

## 2022-04-13 ENCOUNTER — Other Ambulatory Visit: Payer: Self-pay | Admitting: Orthopaedic Surgery

## 2022-04-13 MED ORDER — HYDROCODONE-ACETAMINOPHEN 5-325 MG PO TABS: 1.0000 | ORAL_TABLET | Freq: Three times a day (TID) | ORAL | 0 refills | Status: DC | PRN

## 2022-04-19 ENCOUNTER — Other Ambulatory Visit: Payer: Self-pay | Admitting: Orthopaedic Surgery

## 2022-04-19 MED ORDER — HYDROCODONE-ACETAMINOPHEN 5-325 MG PO TABS: 1.0000 | ORAL_TABLET | Freq: Three times a day (TID) | ORAL | 0 refills | Status: DC | PRN

## 2022-04-21 ENCOUNTER — Other Ambulatory Visit: Payer: Self-pay | Admitting: Medical

## 2022-04-21 NOTE — Telephone Encounter (Signed)
Refill request last apt 03/14/22 next apt 05/15/22.

## 2022-04-24 ENCOUNTER — Other Ambulatory Visit (INDEPENDENT_AMBULATORY_CARE_PROVIDER_SITE_OTHER): Payer: 59

## 2022-04-24 ENCOUNTER — Other Ambulatory Visit: Payer: Self-pay | Admitting: Medical

## 2022-04-24 ENCOUNTER — Encounter: Payer: Self-pay | Admitting: Physician Assistant

## 2022-04-24 ENCOUNTER — Ambulatory Visit (INDEPENDENT_AMBULATORY_CARE_PROVIDER_SITE_OTHER): Payer: 59 | Admitting: Physician Assistant

## 2022-04-24 DIAGNOSIS — G8929 Other chronic pain: Secondary | ICD-10-CM | POA: Diagnosis not present

## 2022-04-24 DIAGNOSIS — M25511 Pain in right shoulder: Secondary | ICD-10-CM

## 2022-04-24 MED ORDER — LIDOCAINE HCL 1 % IJ SOLN
3.0000 mL | INTRAMUSCULAR | Status: AC | PRN
  Administered 2022-04-24: 3 mL

## 2022-04-24 MED ORDER — WEGOVY 1.7 MG/0.75ML ~~LOC~~ SOAJ: 1.7000 mg | SUBCUTANEOUS | 0 refills | Status: DC

## 2022-04-24 MED ORDER — METHYLPREDNISOLONE ACETATE 40 MG/ML IJ SUSP
40.0000 mg | INTRAMUSCULAR | Status: AC | PRN
  Administered 2022-04-24: 40 mg via INTRA_ARTICULAR

## 2022-04-24 NOTE — Progress Notes (Signed)
Office Visit Note   Patient: Kayla Larsen           Date of Birth: October 30, 1970           MRN: [id number] Visit Date: 04/24/2022              Requested by: Jac Canavan, PA-C [address] [place],  [place] [address] PCP: Jac Canavan, PA-C   Assessment & Plan: Visit Diagnoses:  1. Chronic right shoulder pain     Plan: She will continue her meloxicam.  In regards to her shoulder complaints here back in 2 weeks see what type of response she had to the injection.  She is shown range of motion exercises for the right shoulder.  Questions encouraged and answered at length.  Follow-Up Instructions: Return in about 2 weeks (around 05/08/2022).   Orders:  Orders Placed This Encounter  Procedures   Large Joint Inj   XR Shoulder Right   No orders of the defined types were placed in this encounter.     Procedures: Large Joint Inj: R subacromial bursa on 04/24/2022 10:02 AM Indications: pain Details: 22 G 1.5 in needle, superior approach  Arthrogram: No  Medications: 3 mL lidocaine 1 %; 40 mg methylPREDNISolone acetate 40 MG/ML Outcome: tolerated well, no immediate complications Procedure, treatment alternatives, risks and benefits explained, specific risks discussed. Consent was given by the patient. Immediately prior to procedure a time out was called to verify the correct patient, procedure, equipment, support staff and site/side marked as required. Patient was prepped and draped in the usual sterile fashion.       Clinical Data: No additional findings.   Subjective: Chief Complaint  Patient presents with   Right Shoulder - Pain    HPI [name] comes in today for right shoulder pain.  She states a heavy box hit her shoulder in December.  She has had pain in the shoulder since then.  She seen a chiropractor for the shoulder.  Also tried heat different ointments on the shoulder.  She does take meloxicam.  She denies any radicular symptoms down the arm.  No  waking pain.  No numbness tingling.  Pain is mostly anterior aspect of the shoulder.  No prior pain in the shoulder.  Is unsure if her pain may be due to the fact she used a walker after having her knee replacement.  Review of Systems  Constitutional:  Negative for chills and fever.     Objective: Vital Signs: There were no vitals taken for this visit.  Physical Exam Constitutional:      Appearance: She is not ill-appearing or diaphoretic.  Pulmonary:     Effort: Pulmonary effort is normal.  Neurological:     Mental Status: She is alert and oriented to person, place, and time.  Psychiatric:        Mood and Affect: Mood normal.     Ortho Exam Bilateral shoulders full overhead activity actively.  Tight upon extremes of external and internal rotation against resistance.  Positive intermittent on the right.  Empty can test is positive on the right and liftoff test positive on the right.  Left shoulder full strength throughout.  Nontender over the right AC joint. Specialty Comments:  No specialty comments available.  Imaging: XR Shoulder Right  Result Date: 04/24/2022 Right shoulder 3 views: Shoulder is well located.  No acute fractures.  Moderate AC joint changes.  Glenohumeral joint is well-maintained.    PMFS History: Patient Active Problem  List   Diagnosis Date Noted   Status post total left knee replacement 01/13/2022   BMI 40.0-44.9, adult 01/05/2022   Unilateral primary osteoarthritis, left knee 10/31/2021   Status post total right knee replacement 01/16/2021   Encounter for health maintenance examination in adult 12/27/2020   Screening for heart disease 12/27/2020   Iron deficiency 12/27/2020   Screening for diabetes mellitus 12/27/2020   BMI 38.0-38.9,adult 12/27/2020   Hypokalemia 12/27/2020   Primary osteoarthritis of right knee 11/16/2020   H/O: hysterectomy 09/21/2020   Vaccine counseling 11/20/2019   Encounter for screening mammogram for malignant neoplasm of  breast 11/20/2019   Gout of foot 11/20/2019   Primary osteoarthritis of both knees 05/14/2019   Diverticulosis 11/19/2018   Vitamin D deficiency 11/24/2016   Overactive bladder 05/03/2015   Essential hypertension 05/03/2015   Arthritis, senescent 05/03/2015   Hypertriglyceridemia 05/03/2015   Impaired fasting blood sugar 05/03/2015   Past Medical History:  Diagnosis Date   Acanthosis nigricans    Anemia    Arthritis of knee    History of blood transfusion 2009   2 units after hysterectomy   Hypertension    Obesity    Wears glasses     Family History  Problem Relation Age of Onset   Cancer Mother        liver and lung   Hypertension Mother    Diabetes Father    Kidney failure Father    Hypertension Brother    Hypertension Brother    Hypertension Brother    Cancer Maternal Aunt        thyroid   Diabetes Maternal Aunt    Hypertension Maternal Aunt    Heart disease Maternal Grandmother    Diabetes Maternal Uncle    Hypertension Maternal Uncle    Stroke Neg Hx     Past Surgical History:  Procedure Laterality Date   CESAREAN SECTION     x 2   COLONOSCOPY  2009   done for anemia eval; Dr. Elnoria Howard   COLONOSCOPY  01/25/2018   multiple aphthous ulcers in terminal ileum likely d/t NSAIDs, diverticulosis in sigmoid colon, Dr. Ileene Patrick   KNEE SURGERY  10/2015   knot removed from R knee   LAPAROTOMY Left 07/30/2012   Procedure: LEFT SALPINGO OOPHERECTOMY,  OMENTECTOMY, AND LYSIS OF ADHESIONS;  Surgeon: Jeannette Corpus, MD;  Location: [facility];  Service: Gynecology;  Laterality: Left;   OOPHORECTOMY  2014   left    TOTAL ABDOMINAL HYSTERECTOMY  2009   TOTAL KNEE ARTHROPLASTY Right 01/14/2021   Procedure: Right TOTAL KNEE ARTHROPLASTY;  Surgeon: Kathryne Hitch, MD;  Location: [facility];  Service: Orthopedics;  Laterality: Right;  RNFA   TOTAL KNEE ARTHROPLASTY Left 01/13/2022   Procedure: LEFT TOTAL KNEE ARTHROPLASTY;  Surgeon: Kathryne Hitch, MD;   Location: [facility];  Service: Orthopedics;  Laterality: Left;   Social History   Occupational History   Not on file  Tobacco Use   Smoking status: Former    Packs/day: 0.25    Years: 4.00    Additional pack years: 0.00    Total pack years: 1.00    Types: Cigarettes    Quit date: 09/29/2011    Years since quitting: 10.5   Smokeless tobacco: Never  Vaping Use   Vaping Use: Never used  Substance and Sexual Activity   Alcohol use: No    Alcohol/week: 2.0 standard drinks of alcohol    Types: 2 Shots of liquor per week  Drug use: No   Sexual activity: Not Currently

## 2022-04-25 ENCOUNTER — Other Ambulatory Visit (HOSPITAL_COMMUNITY): Payer: Self-pay

## 2022-04-25 ENCOUNTER — Other Ambulatory Visit: Payer: Self-pay | Admitting: Internal Medicine

## 2022-04-25 MED ORDER — WEGOVY 1.7 MG/0.75ML ~~LOC~~ SOAJ
1.7000 mg | SUBCUTANEOUS | 0 refills | Status: DC
  Filled 2022-04-25: qty 3, 28d supply, fill #0

## 2022-04-25 NOTE — Telephone Encounter (Signed)
From: Mariea Clonts To: Office of Kristian Covey, [place] Sent: 04/25/2022 8:48 AM EDT Subject: Medication Renewal Request  Refills have been requested for the following medications:   Semaglutide-Weight Management (WEGOVY) 1.7 MG/0.75ML SOAJ [Shane Tysinger]  Patient Comment: Could you please resend this prescription to the [facility] please thats where I picked up the reat of them at.  Preferred pharmacy: [id number] - [place], [place] - [address] AT [facility] [address] [facility] [address] Delivery method: [employer]

## 2022-04-25 NOTE — Telephone Encounter (Signed)
Pt was notified and has an appt already scheduled in May

## 2022-04-27 ENCOUNTER — Other Ambulatory Visit: Payer: Self-pay | Admitting: Orthopaedic Surgery

## 2022-04-27 MED ORDER — HYDROCODONE-ACETAMINOPHEN 5-325 MG PO TABS: 1.0000 | ORAL_TABLET | Freq: Three times a day (TID) | ORAL | 0 refills | Status: DC | PRN

## 2022-05-03 ENCOUNTER — Other Ambulatory Visit (HOSPITAL_COMMUNITY): Payer: Self-pay

## 2022-05-08 ENCOUNTER — Ambulatory Visit (INDEPENDENT_AMBULATORY_CARE_PROVIDER_SITE_OTHER): Payer: 59 | Admitting: Physician Assistant

## 2022-05-08 ENCOUNTER — Encounter: Payer: Self-pay | Admitting: Physician Assistant

## 2022-05-08 DIAGNOSIS — M25511 Pain in right shoulder: Secondary | ICD-10-CM | POA: Diagnosis not present

## 2022-05-08 NOTE — Addendum Note (Signed)
Addended by: Barbette Or on: 05/08/2022 04:31 PM   Modules accepted: Orders

## 2022-05-08 NOTE — Progress Notes (Signed)
HPI: [name] returns today for follow-up of her right shoulder status post subacromial injection 04/24/2022.  She states that she still having pain in the shoulder.  About 3 days of relief.  States that the relief is about 80%.  Now back to her baseline.  She has mostly pain in her shoulder when she is lying down.  Pain goes down into the biceps region.  The other shoulder has been bothering her since she hit the shoulder on a heavy box and the symptoms are 2023.  Review of systems see HPI otherwise negative or noncontributory.  Bilateral shoulders: 5 out of 5 strength external and internal rotation against resistance.  Positive empty can test, and impingement on the right.  Liftoff test is negative bilaterally but causes pain on the right.  Impression: Acute right shoulder pain  Plan: Given patient's exam and failure of conservative treatment which is included time NSAIDs and subacromial injection recommend MRI.  MRI of the right shoulder to rule out rotator cuff tear.  Have her follow-up after the MRI to go over results and discuss further treatment.  Questions were encouraged and answered at length

## 2022-05-15 ENCOUNTER — Other Ambulatory Visit (HOSPITAL_COMMUNITY): Payer: Self-pay

## 2022-05-15 ENCOUNTER — Ambulatory Visit (INDEPENDENT_AMBULATORY_CARE_PROVIDER_SITE_OTHER): Payer: 59 | Admitting: Medical

## 2022-05-15 VITALS — BP 110/70 | HR 68 | Wt 200.4 lb

## 2022-05-15 DIAGNOSIS — I10 Essential (primary) hypertension: Secondary | ICD-10-CM

## 2022-05-15 DIAGNOSIS — E781 Pure hyperglyceridemia: Secondary | ICD-10-CM

## 2022-05-15 DIAGNOSIS — M109 Gout, unspecified: Secondary | ICD-10-CM

## 2022-05-15 DIAGNOSIS — Z6837 Body mass index (BMI) 37.0-37.9, adult: Secondary | ICD-10-CM | POA: Diagnosis not present

## 2022-05-15 DIAGNOSIS — R7301 Impaired fasting glucose: Secondary | ICD-10-CM | POA: Diagnosis not present

## 2022-05-15 MED ORDER — WEGOVY 2.4 MG/0.75ML ~~LOC~~ SOAJ
2.4000 mg | SUBCUTANEOUS | 1 refills | Status: DC
  Filled 2022-05-15: qty 3, 28d supply, fill #0
  Filled 2022-06-08: qty 3, 28d supply, fill #1

## 2022-05-15 NOTE — Patient Instructions (Signed)
Recommendations: Since you have lost weight, your blood pressure may start to drop more than we would like.  Lets modify your regimen  Your current medication is Olmesartan Amlodipine HCTZ 40/5/12.5mg  daily.   I recommend using 1/2 tablet daily for the time being.   Monitor your pressure to make sure you are staying under 130/80.      If you are not staying under 130/80, then continue with 1 whole tablet daily.     In the next few months if you start running too low on BP, such as 105/60 or less, then we will need to change to a different medicaiton such as Olmesartan Amlodipine without the HCTZ.    Lets plan to recheck in 6-8 weeks.

## 2022-05-15 NOTE — Progress Notes (Signed)
Subjective:  Kayla Larsen is a 52 y.o. female who presents for Chief Complaint  Patient presents with   2 month follow-up    Follow-up on Weight     Here for follow up weight management.  Exercise - walking at walk more regularly, doing some chair exercise.  Had left knee surgery in January.   Doing ok from that, other than some pain when it rains.   Getting ready to start doing some resistance exercise given some muscle tone concerns.     Not hungry that much.  Using smoothies, egg white, boiled eggs, [place],  stop eating out at restaurants, drinking more water.   Was drinking a daily frappacino but cut this out.    Using wegovy 1.7 mg weekly.  If over eat has an epigastric pain so careful not to over eat.  No other aggravating or relieving factors.    No other c/o.  Past Medical History:  Diagnosis Date   Acanthosis nigricans    Anemia    Arthritis of knee    History of blood transfusion 2009   2 units after hysterectomy   Hypertension    Obesity    Wears glasses    Current Outpatient Medications on File Prior to Visit  Medication Sig Dispense Refill   Ascorbic Acid (VITAMIN C PO) Take 2,000 mg by mouth daily.     Cholecalciferol (VITAMIN D) 50 MCG (2000 UT) tablet Take 1 tablet (2,000 Units total) by mouth daily. 90 tablet 3   CRANBERRY PO Take 1 capsule by mouth daily.     diclofenac Sodium (VOLTAREN) 1 % GEL APPLY TOPICALLY TO THE AFFECTED AREA DAILY 100 g 1   ferrous sulfate 325 (65 FE) MG tablet Take 1 tablet (325 mg total) by mouth daily. 90 tablet 0   Multiple Vitamin (MULTIVITAMIN WITH MINERALS) TABS Take 1 tablet by mouth daily. One A Day for Women 50+     Olmesartan-amLODIPine-HCTZ 40-5-12.5 MG TABS TAKE 1 TABLET BY MOUTH DAILY 90 tablet 0   potassium chloride (KLOR-CON) 10 MEQ tablet TAKE 1 TABLET(10 MEQ) BY MOUTH DAILY 90 tablet 0   tolterodine (DETROL LA) 4 MG 24 hr capsule TAKE 1 CAPSULE(4 MG) BY MOUTH DAILY 90 capsule 0   No current  facility-administered medications on file prior to visit.     The following portions of the patient's history were reviewed and updated as appropriate: allergies, current medications, past family history, past medical history, past social history, past surgical history and problem list.  ROS Otherwise as in subjective above    Objective: BP 110/70   Pulse 68   Wt 200 lb 6.4 oz (90.9 kg)   BMI 37.87 kg/m   Wt Readings from Last 3 Encounters:  05/15/22 200 lb 6.4 oz (90.9 kg)  03/14/22 218 lb 12.8 oz (99.2 kg)  01/13/22 225 lb (102.1 kg)   BP Readings from Last 3 Encounters:  05/15/22 110/70  03/14/22 110/70  01/16/22 116/68    General appearance: alert, no distress, well developed, well nourished Heart: RRR, normal S1, S2, no murmurs Lungs: CTA bilaterally, no wheezes, rhonchi, or rales Psych: pleasant, good eye contact, answers questions appropriately    Assessment: Encounter Diagnoses  Name Primary?   Essential hypertension Yes   BMI 37.0-37.9, adult    Impaired fasting blood sugar    Gout of foot, unspecified cause, unspecified chronicity, unspecified laterality    Hypertriglyceridemia      Plan: She continues to do well with [facility].  Increase to higher dose of Wegovy.   Discussed risks/benefits . Continue with health lifestyle.   We will monitor BP closely.  She will go ahead and decrease dose of her current BP medication.   She has lots of current supply of her BP medication, so we will begin 1/2 tablet daily instead of switching to different medication.     Recommendations: Since you have lost weight, your blood pressure may start to drop more than we would like.  Lets modify your regimen  Your current medication is Olmesartan Amlodipine HCTZ 40/5/12.5mg  daily.   I recommend using 1/2 tablet daily for the time being.   Monitor your pressure to make sure you are staying under 130/80.      If you are not staying under 130/80, then continue with 1 whole  tablet daily.     In the next few months if you start running too low on BP, such as 105/60 or less, then we will need to change to a different medicaiton such as Olmesartan Amlodipine without the HCTZ.    Lets plan to recheck in 6-8 weeks.    Kayla Larsen was seen today for 2 month follow-up.  Diagnoses and all orders for this visit:  Essential hypertension  BMI 37.0-37.9, adult  Impaired fasting blood sugar  Gout of foot, unspecified cause, unspecified chronicity, unspecified laterality  Hypertriglyceridemia  Other orders -     Semaglutide-Weight Management (WEGOVY) 2.4 MG/0.75ML SOAJ; Inject 2.4 mg into the skin once a week.    Follow up: 6-8 wk

## 2022-05-18 ENCOUNTER — Other Ambulatory Visit: Payer: Self-pay | Admitting: Physician Assistant

## 2022-05-21 ENCOUNTER — Ambulatory Visit
Admission: RE | Admit: 2022-05-21 | Discharge: 2022-05-21 | Disposition: A | Discharge status: Home / Self Care | Payer: 59 | Source: Ambulatory Visit | Attending: Physician Assistant | Admitting: Physician Assistant

## 2022-05-21 DIAGNOSIS — M25511 Pain in right shoulder: Secondary | ICD-10-CM

## 2022-05-24 ENCOUNTER — Other Ambulatory Visit: Payer: Self-pay | Admitting: Medical

## 2022-05-24 MED ORDER — AMLODIPINE-OLMESARTAN 5-20 MG PO TABS: 1.0000 | ORAL_TABLET | Freq: Every day | ORAL | 1 refills | Status: DC

## 2022-05-26 ENCOUNTER — Other Ambulatory Visit: Payer: Self-pay | Admitting: Physician Assistant

## 2022-05-31 ENCOUNTER — Telehealth: Payer: Self-pay | Admitting: Physician Assistant

## 2022-05-31 NOTE — Telephone Encounter (Signed)
Patient states that she was told by Rexene Edison to come in ASAP due to her MRI results. No opening till June 10th. Please advise

## 2022-06-08 ENCOUNTER — Other Ambulatory Visit: Payer: Self-pay | Admitting: Physician Assistant

## 2022-06-08 ENCOUNTER — Other Ambulatory Visit: Payer: Self-pay | Admitting: Medical

## 2022-06-27 ENCOUNTER — Other Ambulatory Visit: Payer: Self-pay | Admitting: Medical

## 2022-06-28 ENCOUNTER — Ambulatory Visit (INDEPENDENT_AMBULATORY_CARE_PROVIDER_SITE_OTHER): Payer: 59 | Admitting: Orthopaedic Surgery

## 2022-06-28 ENCOUNTER — Encounter: Payer: Self-pay | Admitting: Orthopaedic Surgery

## 2022-06-28 DIAGNOSIS — M75121 Complete rotator cuff tear or rupture of right shoulder, not specified as traumatic: Secondary | ICD-10-CM | POA: Diagnosis not present

## 2022-06-28 MED ORDER — DICLOFENAC SODIUM 1 % EX GEL: 2.0000 g | Freq: Three times a day (TID) | CUTANEOUS | 1 refills | Status: DC | PRN

## 2022-06-28 MED ORDER — MELOXICAM 7.5 MG PO TABS: 7.5000 mg | ORAL_TABLET | Freq: Two times a day (BID) | ORAL | 3 refills | Status: DC | PRN

## 2022-06-28 NOTE — Progress Notes (Signed)
The patient comes in today to go over MRI of her right shoulder.  She is back to her regular work duties and her knee replacement is doing well but she had had a box fall on her shoulder and has had some shoulder issues since then.  She said therapy and chiropractic treatments have helped in the shoulder is feeling better.  The MRI of her right shoulder does show full-thickness retracted rotator cuff tear with only mild to moderate arthritis of the [facility] joint and some chondrosis of the glenohumeral joint.  There is no muscle atrophy at all.  Examination right shoulder does show some weakness with abduction but her mobility with that shoulder is much better and her pain is less.  She is requesting more meloxicam and even some Voltaren gel.  We talked about rotator cuff surgery and what that entails.  She would really like to wait until January so she needs to really be careful with her right shoulder in terms of no heavy lifting and overhead activities.  If things worsen before then she will let us know.  If not we will see her back in early December to consider surgery again for her right shoulder in January.  This will depend on her clinical exam findings as well.

## 2022-07-01 ENCOUNTER — Other Ambulatory Visit: Payer: Self-pay | Admitting: Family Medicine

## 2022-07-03 ENCOUNTER — Encounter: Payer: Self-pay | Admitting: Medical

## 2022-07-03 ENCOUNTER — Ambulatory Visit (INDEPENDENT_AMBULATORY_CARE_PROVIDER_SITE_OTHER): Payer: 59 | Admitting: Medical

## 2022-07-03 ENCOUNTER — Other Ambulatory Visit (HOSPITAL_COMMUNITY): Payer: Self-pay

## 2022-07-03 VITALS — BP 124/82 | HR 89 | Resp 16 | Ht 64.0 in | Wt 190.2 lb

## 2022-07-03 DIAGNOSIS — R252 Cramp and spasm: Secondary | ICD-10-CM

## 2022-07-03 DIAGNOSIS — I10 Essential (primary) hypertension: Secondary | ICD-10-CM

## 2022-07-03 DIAGNOSIS — N3281 Overactive bladder: Secondary | ICD-10-CM

## 2022-07-03 DIAGNOSIS — R7301 Impaired fasting glucose: Secondary | ICD-10-CM | POA: Diagnosis not present

## 2022-07-03 DIAGNOSIS — E611 Iron deficiency: Secondary | ICD-10-CM | POA: Diagnosis not present

## 2022-07-03 LAB — IRON,TIBC AND FERRITIN PANEL

## 2022-07-03 LAB — CBC WITH DIFFERENTIAL/PLATELET

## 2022-07-03 LAB — BASIC METABOLIC PANEL: BUN/Creatinine Ratio: 15 (ref 9–23)

## 2022-07-03 MED ORDER — WEGOVY 2.4 MG/0.75ML ~~LOC~~ SOAJ
2.4000 mg | SUBCUTANEOUS | 3 refills | Status: DC
  Filled 2022-07-03: qty 3, 28d supply, fill #0
  Filled 2022-07-30: qty 3, 28d supply, fill #1
  Filled 2022-08-27: qty 3, 28d supply, fill #2
  Filled 2022-10-07: qty 3, 28d supply, fill #3

## 2022-07-03 MED ORDER — AMLODIPINE-OLMESARTAN 5-20 MG PO TABS: 1.0000 | ORAL_TABLET | Freq: Every day | ORAL | 3 refills | Status: DC

## 2022-07-03 MED ORDER — TOLTERODINE TARTRATE ER 4 MG PO CP24: ORAL_CAPSULE | ORAL | 3 refills | Status: DC

## 2022-07-03 NOTE — Progress Notes (Signed)
Subjective:  Kayla Larsen is a 52 y.o. female who presents for Chief Complaint  Patient presents with   Medical Management of Chronic Issues   Hypertension     Here for follow up weight management.  Last visit she increased to the highest dose of Wegovy.  She is getting some belching and some constipation.  It is not intolerable though.  Needs refills on some medications.  Last visit we changed to a different blood pressure pill since her blood pressure had improved and she has lost weight.  She is tolerating that blood pressure medication fine  Her orthopedic doctor just released her to start doing light exercise from where she had knee surgery in January 2024.  Not hungry that much.  Using smoothies, egg white, boiled eggs, [place],  stop eating out at restaurants, drinking more water.   Prior to these efforts to lose weight she was eating at [employer] most every day.   She has been getting some cramping in her lower legs.  No other aggravating or relieving factors.    No other c/o.  Past Medical History:  Diagnosis Date   Acanthosis nigricans    Anemia    Arthritis of knee    History of blood transfusion 2009   2 units after hysterectomy   Hypertension    Obesity    Wears glasses    Current Outpatient Medications on File Prior to Visit  Medication Sig Dispense Refill   Ascorbic Acid (VITAMIN C PO) Take 2,000 mg by mouth daily.     Cholecalciferol (VITAMIN D) 50 MCG (2000 UT) tablet Take 1 tablet (2,000 Units total) by mouth daily. 90 tablet 3   CRANBERRY PO Take 1 capsule by mouth daily.     diclofenac Sodium (VOLTAREN) 1 % GEL Apply 2 g topically 3 (three) times daily as needed. 100 g 1   ferrous sulfate 325 (65 FE) MG tablet Take 1 tablet (325 mg total) by mouth daily. 90 tablet 0   meloxicam (MOBIC) 7.5 MG tablet Take 1 tablet (7.5 mg total) by mouth 2 (two) times daily between meals as needed for pain. 60 tablet 3   Multiple Vitamin (MULTIVITAMIN WITH MINERALS)  TABS Take 1 tablet by mouth daily. One A Day for Women 50+     potassium chloride (KLOR-CON) 10 MEQ tablet TAKE 1 TABLET(10 MEQ) BY MOUTH DAILY 90 tablet 0   tiZANidine (ZANAFLEX) 4 MG tablet TAKE 1 TABLET(4 MG) BY MOUTH EVERY 8 HOURS AS NEEDED FOR MUSCLE SPASMS (Patient not taking: Reported on 07/03/2022) 30 tablet 0   No current facility-administered medications on file prior to visit.     The following portions of the patient's history were reviewed and updated as appropriate: allergies, current medications, past family history, past medical history, past social history, past surgical history and problem list.  ROS Otherwise as in subjective above    Objective: BP 124/82   Pulse 89   Resp 16   Ht 5\' 4"  (1.626 m)   Wt 190 lb 3.2 oz (86.3 kg)   SpO2 97% Comment: room air  BMI 32.65 kg/m   Wt Readings from Last 3 Encounters:  07/03/22 190 lb 3.2 oz (86.3 kg)  05/15/22 200 lb 6.4 oz (90.9 kg)  03/14/22 218 lb 12.8 oz (99.2 kg)   BP Readings from Last 3 Encounters:  07/03/22 124/82  05/15/22 110/70  03/14/22 110/70    General appearance: alert, no distress, well developed, well nourished Lower legs nontender with no  obvious deformity or swelling Psych: pleasant, good eye contact, answers questions appropriately Legs neurovascularly intact   Assessment: Encounter Diagnoses  Name Primary?   Essential hypertension Yes   Overactive bladder    Impaired fasting blood sugar    Iron deficiency    Muscle cramp       Plan: Hypertension-last visit we stopped Olmesartan Amlodipine HCTZ 40/5/12.5mg  daily and changed to amlodipine olmesartan 5/20 mg daily.  Blood pressure at goal.  Continue current medication.  Her blood pressure was dropping prior to last visit as she has lost weight.  She continues to do well with [facility].  She is now on the highest dose of Wegovy.  We discussed risk and benefits of medication.  She will continue to work on losing additional weight through  healthy eating habits and regular exercise.  Cautioned on side effects of Wegovy that may warrant lowering dose.  Leg cramps-updated labs today  Currently is on potassium but pending labs we might be able to stop this.  Refilled medicine for overactive bladder for now  [name] was seen today for medical management of chronic issues and hypertension.  Diagnoses and all orders for this visit:  Essential hypertension -     Basic metabolic panel  Overactive bladder  Impaired fasting blood sugar -     Basic metabolic panel  Iron deficiency -     CBC with Differential/Platelet -     Iron, TIBC and Ferritin Panel  Muscle cramp -     Basic metabolic panel -     Magnesium  Other orders -     amLODipine-olmesartan (AZOR) 5-20 MG tablet; Take 1 tablet by mouth daily. -     tolterodine (DETROL LA) 4 MG 24 hr capsule; TAKE 1 CAPSULE(4 MG) BY MOUTH DAILY -     Semaglutide-Weight Management (WEGOVY) 2.4 MG/0.75ML SOAJ; Inject 2.4 mg into the skin once a week.     Follow up: 3 to 4 months

## 2022-07-04 ENCOUNTER — Other Ambulatory Visit: Payer: Self-pay | Admitting: Medical

## 2022-07-04 ENCOUNTER — Other Ambulatory Visit: Payer: Self-pay | Admitting: Orthopaedic Surgery

## 2022-07-04 LAB — BASIC METABOLIC PANEL
BUN: 10 mg/dL (ref 6–24)
CO2: 20 mmol/L (ref 20–29)
Calcium: 10 mg/dL (ref 8.7–10.2)
Chloride: 109 mmol/L — ABNORMAL HIGH (ref 96–106)
Creatinine, Ser: 0.65 mg/dL (ref 0.57–1.00)
Glucose: 97 mg/dL (ref 70–99)
Potassium: 4.2 mmol/L (ref 3.5–5.2)
Sodium: 143 mmol/L (ref 134–144)
eGFR: 105 mL/min/{1.73_m2} (ref >59)

## 2022-07-04 LAB — CBC WITH DIFFERENTIAL/PLATELET
Basophils Absolute: 0 10*3/uL (ref 0.0–0.2)
Basos: 0 %
EOS (ABSOLUTE): 0.2 10*3/uL (ref 0.0–0.4)
Eos: 4 %
Hematocrit: 34.4 % (ref 34.0–46.6)
Hemoglobin: 10.4 g/dL — ABNORMAL LOW (ref 11.1–15.9)
Immature Grans (Abs): 0 10*3/uL (ref 0.0–0.1)
Immature Granulocytes: 0 %
Lymphocytes Absolute: 2.4 10*3/uL (ref 0.7–3.1)
Lymphs: 53 %
MCH: 22.3 pg — ABNORMAL LOW (ref 26.6–33.0)
MCHC: 30.2 g/dL — ABNORMAL LOW (ref 31.5–35.7)
MCV: 74 fL — ABNORMAL LOW (ref 79–97)
Monocytes Absolute: 0.4 10*3/uL (ref 0.1–0.9)
Monocytes: 10 %
Neutrophils Absolute: 1.5 10*3/uL (ref 1.4–7.0)
Neutrophils: 33 %
Platelets: 446 10*3/uL (ref 150–450)
RDW: 16.6 % — ABNORMAL HIGH (ref 11.7–15.4)
WBC: 4.5 10*3/uL (ref 3.4–10.8)

## 2022-07-04 LAB — IRON,TIBC AND FERRITIN PANEL
Iron Saturation: 30 % (ref 15–55)
Total Iron Binding Capacity: 247 ug/dL — ABNORMAL LOW (ref 250–450)
UIBC: 174 ug/dL (ref 131–425)

## 2022-07-04 LAB — MAGNESIUM: Magnesium: 2.1 mg/dL (ref 1.6–2.3)

## 2022-07-04 MED ORDER — FERROUS SULFATE 325 (65 FE) MG PO TABS: 325.0000 mg | ORAL_TABLET | ORAL | 1 refills | Status: DC

## 2022-07-04 MED ORDER — POTASSIUM CHLORIDE ER 10 MEQ PO TBCR: 5.0000 meq | EXTENDED_RELEASE_TABLET | Freq: Every day | ORAL | 0 refills | Status: DC

## 2022-07-04 NOTE — Progress Notes (Signed)
Results sent through MyChart

## 2022-07-24 ENCOUNTER — Other Ambulatory Visit: Payer: Self-pay | Admitting: Podiatry

## 2022-07-24 ENCOUNTER — Ambulatory Visit (INDEPENDENT_AMBULATORY_CARE_PROVIDER_SITE_OTHER): Payer: 59

## 2022-07-24 ENCOUNTER — Ambulatory Visit (INDEPENDENT_AMBULATORY_CARE_PROVIDER_SITE_OTHER): Payer: 59 | Admitting: Podiatry

## 2022-07-24 DIAGNOSIS — M1A0710 Idiopathic chronic gout, right ankle and foot, without tophus (tophi): Secondary | ICD-10-CM

## 2022-07-24 DIAGNOSIS — M205X9 Other deformities of toe(s) (acquired), unspecified foot: Secondary | ICD-10-CM

## 2022-07-24 DIAGNOSIS — M1A0720 Idiopathic chronic gout, left ankle and foot, without tophus (tophi): Secondary | ICD-10-CM

## 2022-07-24 MED ORDER — ALLOPURINOL 100 MG PO TABS
100.0000 mg | ORAL_TABLET | Freq: Every day | ORAL | 6 refills | Status: DC
Start: 2022-07-24 — End: 2023-01-22

## 2022-07-24 NOTE — Progress Notes (Signed)
Subjective:   Patient ID: Kayla Larsen, female   DOB: 52 y.o.   MRN: [id number]   HPI Patient presents with problems with the big toe joint right over left and history of gout stating allopurinol really helps her   ROS      Objective:  Physical Exam  Neurovascular status intact with patient found to have inflammation of the right first MPJ over left reduced range of motion signs of arthritis within the joint surface no current swelling     Assessment:  Possibility for gout but also strong possibility for osteoarthritis or combination of the 2 things     Plan:  H&P educated her on this took x-rays reviewed we will start her back again on allopurinol but ultimately may be more related to arthritis of the joint surface which I educated her on today  X-rays bilateral do indicate multiple signs of arthritis of the first MPJ both feet with spur formation

## 2022-07-25 ENCOUNTER — Other Ambulatory Visit: Payer: Self-pay | Admitting: Orthopaedic Surgery

## 2022-08-01 ENCOUNTER — Other Ambulatory Visit: Payer: Self-pay | Admitting: Obstetrics and Gynecology

## 2022-08-01 DIAGNOSIS — R928 Other abnormal and inconclusive findings on diagnostic imaging of breast: Secondary | ICD-10-CM

## 2022-08-08 ENCOUNTER — Ambulatory Visit
Admission: RE | Admit: 2022-08-08 | Discharge: 2022-08-08 | Disposition: A | Discharge status: Home / Self Care | Payer: 59 | Source: Ambulatory Visit | Attending: Obstetrics and Gynecology | Admitting: Obstetrics and Gynecology

## 2022-08-08 DIAGNOSIS — R928 Other abnormal and inconclusive findings on diagnostic imaging of breast: Secondary | ICD-10-CM

## 2022-08-27 ENCOUNTER — Other Ambulatory Visit: Payer: Self-pay | Admitting: Orthopaedic Surgery

## 2022-08-31 ENCOUNTER — Telehealth: Payer: Self-pay | Admitting: Orthopaedic Surgery

## 2022-08-31 NOTE — Telephone Encounter (Signed)
Pt called asking for a call back. Pt states she would like to get her ears pierced and she had surgery and Dr Magnus Ivan told her to wait awhile and she is asking if it's ok. Pt phone number is [phone].

## 2022-08-31 NOTE — Telephone Encounter (Signed)
Patient aware.

## 2022-09-02 ENCOUNTER — Other Ambulatory Visit (HOSPITAL_COMMUNITY): Payer: Self-pay

## 2022-09-10 ENCOUNTER — Other Ambulatory Visit: Payer: Self-pay | Admitting: Orthopaedic Surgery

## 2022-09-21 ENCOUNTER — Other Ambulatory Visit: Payer: Self-pay | Admitting: Medical

## 2022-09-21 ENCOUNTER — Other Ambulatory Visit: Payer: Self-pay | Admitting: Orthopaedic Surgery

## 2022-09-27 ENCOUNTER — Other Ambulatory Visit: Payer: Self-pay | Admitting: Orthopaedic Surgery

## 2022-10-07 ENCOUNTER — Other Ambulatory Visit: Payer: Self-pay | Admitting: Orthopaedic Surgery

## 2022-10-22 ENCOUNTER — Other Ambulatory Visit: Payer: Self-pay | Admitting: Orthopaedic Surgery

## 2022-10-29 ENCOUNTER — Other Ambulatory Visit: Payer: Self-pay | Admitting: Medical

## 2022-10-30 ENCOUNTER — Other Ambulatory Visit (HOSPITAL_COMMUNITY): Payer: Self-pay

## 2022-10-30 MED ORDER — WEGOVY 2.4 MG/0.75ML ~~LOC~~ SOAJ
2.4000 mg | SUBCUTANEOUS | 0 refills | Status: DC
  Filled 2022-10-30: qty 3, 28d supply, fill #0

## 2022-10-31 ENCOUNTER — Other Ambulatory Visit (HOSPITAL_COMMUNITY): Payer: Self-pay

## 2022-11-02 ENCOUNTER — Ambulatory Visit (INDEPENDENT_AMBULATORY_CARE_PROVIDER_SITE_OTHER): Payer: 59 | Admitting: Medical

## 2022-11-02 VITALS — BP 120/78 | HR 60 | Wt 192.6 lb

## 2022-11-02 DIAGNOSIS — E611 Iron deficiency: Secondary | ICD-10-CM

## 2022-11-02 DIAGNOSIS — Z23 Encounter for immunization: Secondary | ICD-10-CM | POA: Diagnosis not present

## 2022-11-02 DIAGNOSIS — E559 Vitamin D deficiency, unspecified: Secondary | ICD-10-CM

## 2022-11-02 DIAGNOSIS — E66811 Obesity, class 1: Secondary | ICD-10-CM | POA: Diagnosis not present

## 2022-11-02 DIAGNOSIS — I10 Essential (primary) hypertension: Secondary | ICD-10-CM

## 2022-11-02 DIAGNOSIS — Z7185 Encounter for immunization safety counseling: Secondary | ICD-10-CM

## 2022-11-02 DIAGNOSIS — R7301 Impaired fasting glucose: Secondary | ICD-10-CM

## 2022-11-02 DIAGNOSIS — K5790 Diverticulosis of intestine, part unspecified, without perforation or abscess without bleeding: Secondary | ICD-10-CM

## 2022-11-02 LAB — POCT HEMOGLOBIN: Hemoglobin: 9.5 g/dL — AB (ref 11–14.6)

## 2022-11-02 NOTE — Progress Notes (Signed)
Subjective:  Kayla KOHLENBERG is a 52 y.o. female who presents for Chief Complaint  Patient presents with   Medical Management of Chronic Issues    Med check- follow-up     Here for med check.  Doing fine on Wegovy.  This really reduces her appetite.  She has lost significant weight on Wegovy.  No particular side effects other than loss of appetite.  She is exercising 4 days/week at the gym aerobic and weightbearing exercise.  She is eating healthy.  Not eating a lot of fruit though.  Iron deficiency anemia-no bleeding or bruising, no blood in the stool or urine or vaginal bleeding.  Is taking iron every other day.  Status post hysterectomy from 2009  Hypertension-compliant with amlodipine olmesartan 5/20 mg daily  Vitamin D deficiency-compliant vitamin D supplement  No other aggravating or relieving factors.    No other c/o.  Past Medical History:  Diagnosis Date   Acanthosis nigricans    Anemia    Arthritis of knee    History of blood transfusion 2009   2 units after hysterectomy   Hypertension    Obesity    Wears glasses    Current Outpatient Medications on File Prior to Visit  Medication Sig Dispense Refill   allopurinol (ZYLOPRIM) 100 MG tablet Take 1 tablet (100 mg total) by mouth daily. 30 tablet 6   amLODipine-olmesartan (AZOR) 5-20 MG tablet Take 1 tablet by mouth daily. 90 tablet 3   Ascorbic Acid (VITAMIN C PO) Take 2,000 mg by mouth daily.     Cholecalciferol (VITAMIN D) 50 MCG (2000 UT) tablet Take 1 tablet (2,000 Units total) by mouth daily. 90 tablet 3   CRANBERRY PO Take 1 capsule by mouth daily.     diclofenac Sodium (VOLTAREN) 1 % GEL APPLY TOPICALLY TO THE AFFECTED AREA DAILY 100 g 1   ferrous sulfate 325 (65 FE) MG tablet Take 1 tablet (325 mg total) by mouth every other day. 90 tablet 1   Multiple Vitamin (MULTIVITAMIN WITH MINERALS) TABS Take 1 tablet by mouth daily. One A Day for Women 50+     potassium chloride (KLOR-CON) 10 MEQ tablet Take 0.5 tablets  (5 mEq total) by mouth daily. 90 tablet 0   Semaglutide-Weight Management (WEGOVY) 2.4 MG/0.75ML SOAJ Inject 2.4 mg into the skin once a week. 3 mL 0   tiZANidine (ZANAFLEX) 4 MG tablet TAKE 1 TABLET(4 MG) BY MOUTH EVERY 8 HOURS AS NEEDED FOR MUSCLE SPASMS 30 tablet 0   tolterodine (DETROL LA) 4 MG 24 hr capsule TAKE 1 CAPSULE(4 MG) BY MOUTH DAILY 90 capsule 3   No current facility-administered medications on file prior to visit.   Past Surgical History:  Procedure Laterality Date   CESAREAN SECTION     x 2   COLONOSCOPY  2009   done for anemia eval; Dr. Elnoria Howard   COLONOSCOPY  01/25/2018   multiple aphthous ulcers in terminal ileum likely d/t NSAIDs, diverticulosis in sigmoid colon, Dr. Ileene Patrick   KNEE SURGERY  10/2015   knot removed from R knee   LAPAROTOMY Left 07/30/2012   Procedure: LEFT SALPINGO OOPHERECTOMY,  OMENTECTOMY, AND LYSIS OF ADHESIONS;  Surgeon: Jeannette Corpus, MD;  Location: [facility];  Service: Gynecology;  Laterality: Left;   OOPHORECTOMY  2014   left    TOTAL ABDOMINAL HYSTERECTOMY  2009   TOTAL KNEE ARTHROPLASTY Right 01/14/2021   Procedure: Right TOTAL KNEE ARTHROPLASTY;  Surgeon: Kathryne Hitch, MD;  Location: [facility];  Service: Orthopedics;  Laterality: Right;  RNFA   TOTAL KNEE ARTHROPLASTY Left 01/13/2022   Procedure: LEFT TOTAL KNEE ARTHROPLASTY;  Surgeon: Kathryne Hitch, MD;  Location: [facility];  Service: Orthopedics;  Laterality: Left;     The following portions of the patient's history were reviewed and updated as appropriate: allergies, current medications, past family history, past medical history, past social history, past surgical history and problem list.  ROS Otherwise as in subjective above  Objective: BP 120/78   Pulse 60   Wt 192 lb 9.6 oz (87.4 kg)   BMI 33.06 kg/m   General appearance: alert, no distress, well developed, well nourished Neck: supple, no lymphadenopathy, no thyromegaly, no masses Heart: RRR,  normal S1, S2, no murmurs Lungs: CTA bilaterally, no wheezes, rhonchi, or rales Abdomen: +bs, soft, non tender, non distended, no masses, no hepatomegaly, no splenomegaly Pulses: 2+ radial pulses, 2+ pedal pulses, normal cap refill Ext: no edema   Assessment: Encounter Diagnoses  Name Primary?   Impaired fasting blood sugar Yes   Obesity (BMI 30.0-34.9)    Needs flu shot    Vitamin D deficiency    Vaccine counseling    Iron deficiency    Essential hypertension    Diverticulosis      Plan: Impaired glucose-doing well with diet and exercise and weight loss efforts.  Continue Wegovy  Obesity-continue Wegovy high dose, continue efforts with exercise and healthy eating habits.  Continue current medication.  Vitamin D deficiency-continue supplement.  Iron deficiency anemia-we discussed possible causes of anemia.  Change back to iron daily instead of every other day.  Get more greens in the diet, counseled on iron in the diet.  We will have her do Hemoccult testing.  Last colonoscopy 2020 reviewed.  Advise she completely stop NSAIDs due to potential risk of bleeding  History of diverticulosis and ulceration on colonoscopy 2020  Hypertension-blood pressure stable, continue current medication amlodipine olmesartan 5/20 mg daily  Counseled on the influenza virus vaccine.  Vaccine information sheet given.  Influenza vaccine given after consent obtained.    Evey was seen today for medical management of chronic issues.  Diagnoses and all orders for this visit:  Impaired fasting blood sugar  Obesity (BMI 30.0-34.9)  Needs flu shot -     Flu vaccine trivalent PF, 6mos and older(Flulaval,Afluria,Fluarix,Fluzone)  Vitamin D deficiency  Vaccine counseling  Iron deficiency -     Hemoglobin -     Fecal occult blood, imunochemical  Essential hypertension  Diverticulosis    Follow up: December 2024 for physical

## 2022-11-02 NOTE — Patient Instructions (Signed)
Impaired glucose-doing well with diet and exercise and weight loss efforts.  Continue Wegovy  Obesity-continue Wegovy high dose, continue efforts with exercise and healthy eating habits.  Continue current medication.  Vitamin D deficiency-continue supplement.  Iron deficiency anemia-we discussed possible causes of anemia.  Continue iron every other day.  We will have her do Hemoccult testing.  Last colonoscopy 2020 reviewed.  Advise she completely stop NSAIDs due to potential risk of bleeding  History of diverticulosis and ulceration on colonoscopy 2020  Hypertension-blood pressure stable, continue current medication amlodipine olmesartan 5/20 mg daily  Counseled on the influenza virus vaccine.  Influenza vaccine given after consent obtained.  Return as scheduled for fasting physical in December

## 2022-11-03 ENCOUNTER — Other Ambulatory Visit: Payer: Self-pay | Admitting: Medical

## 2022-11-10 LAB — FECAL OCCULT BLOOD, IMMUNOCHEMICAL: Fecal Occult Bld: NEGATIVE

## 2022-11-12 NOTE — Progress Notes (Signed)
Hello Dr. Adela Lank  This patient continues to have low hemoglobin.   Hemoccult test was negative, Hgb 9.5, no vaginal bleeding.   She has been taking iron every other day, but I asked her to go back to daily.  Do you feel she needs repeat colonoscopy or endoscopy at this time given her last colonoscopy report and ongoing anemia?

## 2022-11-13 NOTE — Progress Notes (Signed)
Results sent through MyChart

## 2022-11-19 ENCOUNTER — Other Ambulatory Visit: Payer: Self-pay | Admitting: Orthopaedic Surgery

## 2022-11-27 ENCOUNTER — Other Ambulatory Visit: Payer: Self-pay | Admitting: Orthopaedic Surgery

## 2022-11-29 ENCOUNTER — Encounter: Payer: Self-pay | Admitting: Family Medicine

## 2022-11-29 ENCOUNTER — Telehealth (INDEPENDENT_AMBULATORY_CARE_PROVIDER_SITE_OTHER): Payer: 59 | Admitting: Family Medicine

## 2022-11-29 ENCOUNTER — Telehealth: Payer: Self-pay | Admitting: Family Medicine

## 2022-11-29 VITALS — BP 114/75 | HR 60 | Temp 98.7°F | Ht 64.0 in | Wt 187.0 lb

## 2022-11-29 DIAGNOSIS — U071 COVID-19: Secondary | ICD-10-CM | POA: Diagnosis not present

## 2022-11-29 DIAGNOSIS — R0609 Other forms of dyspnea: Secondary | ICD-10-CM | POA: Diagnosis not present

## 2022-11-29 MED ORDER — ALBUTEROL SULFATE HFA 108 (90 BASE) MCG/ACT IN AERS
INHALATION_SPRAY | RESPIRATORY_TRACT | 0 refills | Status: DC
Start: 2022-11-29 — End: 2023-01-04

## 2022-11-29 MED ORDER — NIRMATRELVIR/RITONAVIR (PAXLOVID)TABLET: 3.0000 | ORAL_TABLET | Freq: Two times a day (BID) | ORAL | 0 refills | Status: AC

## 2022-11-29 NOTE — Telephone Encounter (Signed)
This is subject to change. Can put an initial RTW date for Monday (will have completed her Paxlovid on Sunday)--remind her to wear a mask when she returns to work. If she doesn't meet criteria to leave isolation (not fever-free x 24 hours without use of fever reducing medication, or if her respiratory symptoms aren't significantly better), then she may need to have that date extended. She should contact us on Monday with details of her symptoms if not able to return to work

## 2022-11-29 NOTE — Telephone Encounter (Signed)
Pt called and needs date to return to work on letter

## 2022-11-29 NOTE — Progress Notes (Unsigned)
Start time: 9:44 End time: 10:02  Virtual Visit via Video Note  I connected with Kayla Larsen on 11/29/22 by a video enabled telemedicine application and verified that I am speaking with the correct person using two identifiers.  Location: Patient: home Provider: office   I discussed the limitations of evaluation and management by telemedicine and the availability of in person appointments. The patient expressed understanding and agreed to proceed.  History of Present Illness:  Chief Complaint  Patient presents with   Covid Positive    VIRTUAL positive covid. Symptoms started Sunday night into Monday morning. Scratchy throat, SOB, sinus congestion and cough. Took another test this am and positive for covid.    Night of 11/17/early 11/18 she started with itchy throat, nasal congestion.  Now she is having DOE. Cough started yesterday, productive of some yellow-green mucus. Nasal mucus is similar, yellowish. +HA today, across her forehead. Had negative COVID test yesterday, was + this morning. She states her son has had a "bad sinus infection" (tested negative for COVID, only tested once).  She has been taking Emergen-C packets, and Coricidin HBP (acetaminophen, chlorpheniramine)  Only had initial 2 COVID vaccines, no boosters.   PMH, PSH, SH reviewed HTN, gout, OAB On Wegovy    Outpatient Encounter Medications as of 11/29/2022  Medication Sig Note   allopurinol (ZYLOPRIM) 100 MG tablet Take 1 tablet (100 mg total) by mouth daily.    amLODipine-olmesartan (AZOR) 5-20 MG tablet Take 1 tablet by mouth daily.    Ascorbic Acid (VITAMIN C PO) Take 2,000 mg by mouth daily.    Chlorpheniramine-Acetaminophen (CORICIDIN HBP COLD/FLU PO) Take 2 tablets by mouth as needed. 11/29/2022: Last dose 1am   Cholecalciferol (VITAMIN D) 50 MCG (2000 UT) tablet Take 1 tablet (2,000 Units total) by mouth daily.    CRANBERRY PO Take 1 capsule by mouth daily.    diclofenac Sodium (VOLTAREN) 1 %  GEL APPLY TOPICALLY TO THE AFFECTED AREA DAILY    FEROSUL 325 (65 Fe) MG tablet TAKE 1 TABLET(325 MG) BY MOUTH DAILY    Multiple Vitamin (MULTIVITAMIN WITH MINERALS) TABS Take 1 tablet by mouth daily. One A Day for Women 50+    potassium chloride (KLOR-CON) 10 MEQ tablet Take 0.5 tablets (5 mEq total) by mouth daily.    Semaglutide-Weight Management (WEGOVY) 2.4 MG/0.75ML SOAJ Inject 2.4 mg into the skin once a week. 11/29/2022: Takes on Thursday   tolterodine (DETROL LA) 4 MG 24 hr capsule TAKE 1 CAPSULE(4 MG) BY MOUTH DAILY 11/29/2022: Takes every other day   tiZANidine (ZANAFLEX) 4 MG tablet TAKE 1 TABLET(4 MG) BY MOUTH EVERY 8 HOURS AS NEEDED FOR MUSCLE SPASMS (Patient not taking: Reported on 11/29/2022) 11/29/2022: As needed   [DISCONTINUED] diclofenac (VOLTAREN) 75 MG EC tablet TAKE 1 TABLET(75 MG) BY MOUTH TWICE DAILY    No facility-administered encounter medications on file as of 11/29/2022.   Allergies  Allergen Reactions   Amlodipine     Ankle swelling at 10mg    Lisinopril     Cough     ROS:  See HPI No fever. +chills. No n/v/d. No bleeding, bruising, rashes. No chest pain.  Observations/Objective:  BP 114/75   Pulse 60   Temp 98.7 F (37.1 C) (Oral)   Ht 5\' 4"  (1.626 m)   Wt 187 lb (84.8 kg)   BMI 32.10 kg/m   Pleasant, well-appearing female in no distress. Sounds a little congested. Speaking comfortably, in no distress. Only occasional cough during visit Exam is  limited due to the virtual nature of the visit.   Assessment and Plan:  COVID-19 virus infection - SE of paxlovid reviewed. Supportive measures reviewed.  S/sx for which she should get re-evaluated was discussed - Plan: albuterol (VENTOLIN HFA) 108 (90 Base) MCG/ACT inhaler, nirmatrelvir/ritonavir (PAXLOVID) 20 x 150 MG & 10 x 100MG  TABS  DOE (dyspnea on exertion) - in setting of COVID.  albuterol prn--instructed on proper use. Guaifenesin for chest congestion - Plan: albuterol (VENTOLIN HFA) 108 (90  Base) MCG/ACT inhaler  Stay well hydrated. Continue coricidin HBP. ADD guaifenesin and dextromethorphan (expectorant and cough suppressant)--I recommend Mucinex DM 12 hour version. Consider sinus rinses as needed for sinus pain.  Take the Paxlovid as directed. Use the inhaler as needed for shortness of breath  If you have persistent high fevers, worsening cough, pain with breathing, worsening shortness of breath, chest pain, or other concerning symptoms, you should be re-evaluation.  Stay in isolation until you have no fever for 24 hours (without taking anything acetaminophen or ibuprofen) and your respiratory symptoms are significantly improved (80% better). Once you feel better, you can leave your house, but Continue to wear a mask for an additional 5 days.     Follow Up Instructions:    I discussed the assessment and treatment plan with the patient. The patient was provided an opportunity to ask questions and all were answered. The patient agreed with the plan and demonstrated an understanding of the instructions.   The patient was advised to call back or seek an in-person evaluation if the symptoms worsen or if the condition fails to improve as anticipated.  I spent 20 minutes dedicated to the care of this patient, including pre-visit review of records, face to face time, post-visit ordering of testing and documentation.    Lavonda Jumbo, MD

## 2022-11-29 NOTE — Patient Instructions (Signed)
Stay well hydrated. Continue coricidin HBP. ADD guaifenesin and dextromethorphan (expectorant and cough suppressant)--I recommend Mucinex DM 12 hour version. Consider sinus rinses as needed for sinus pain.  Take the Paxlovid as directed. Use the inhaler as needed for shortness of breath  If you have persistent high fevers, worsening cough, pain with breathing, worsening shortness of breath, chest pain, or other concerning symptoms, you should be re-evaluation.  Stay in isolation until you have no fever for 24 hours (without taking anything acetaminophen or ibuprofen) and your respiratory symptoms are significantly improved (80% better). Once you feel better, you can leave your house, but Continue to wear a mask for an additional 5 days.

## 2022-11-30 ENCOUNTER — Other Ambulatory Visit: Payer: Self-pay | Admitting: Medical

## 2022-11-30 ENCOUNTER — Other Ambulatory Visit (HOSPITAL_COMMUNITY): Payer: Self-pay

## 2022-11-30 MED ORDER — WEGOVY 2.4 MG/0.75ML ~~LOC~~ SOAJ
2.4000 mg | SUBCUTANEOUS | 0 refills | Status: DC
  Filled 2022-11-30: qty 3, 28d supply, fill #0

## 2022-12-04 ENCOUNTER — Other Ambulatory Visit: Payer: Self-pay | Admitting: Orthopaedic Surgery

## 2022-12-17 ENCOUNTER — Other Ambulatory Visit: Payer: Self-pay | Admitting: Orthopaedic Surgery

## 2022-12-21 ENCOUNTER — Other Ambulatory Visit (HOSPITAL_COMMUNITY): Payer: Self-pay

## 2022-12-21 ENCOUNTER — Other Ambulatory Visit: Payer: Self-pay | Admitting: Medical

## 2022-12-21 MED ORDER — WEGOVY 2.4 MG/0.75ML ~~LOC~~ SOAJ
2.4000 mg | SUBCUTANEOUS | 0 refills | Status: DC
Start: 2022-12-21 — End: 2023-01-04
  Filled 2022-12-21 – 2022-12-29 (×2): qty 3, 28d supply, fill #0

## 2022-12-27 ENCOUNTER — Encounter: Payer: Self-pay | Admitting: Orthopaedic Surgery

## 2022-12-27 ENCOUNTER — Ambulatory Visit (INDEPENDENT_AMBULATORY_CARE_PROVIDER_SITE_OTHER): Payer: 59 | Admitting: Orthopaedic Surgery

## 2022-12-27 DIAGNOSIS — M75121 Complete rotator cuff tear or rupture of right shoulder, not specified as traumatic: Secondary | ICD-10-CM

## 2022-12-27 NOTE — Progress Notes (Signed)
The patient comes in today for follow-up as a relates to her right shoulder.  She has a known full-thickness rotator cuff tear of the shoulder but right now she says her range of motion is full and she is not really having any pain or problems with her right shoulder.  She denies any weakness.  She is 52 years old.  This was seen on MRI.  She has been working with a Land and he has done a lot of work with her shoulder.  On my exam today her right shoulder moves smoothly and fluidly.  It is pain-free.  Her rotator cuff with abduction and external rotation is strong.  I agree with her not doing anything right now for her right shoulder since functionally she is doing great and clinically she is doing great.  If things worsen at all she will reach out to [place] and let us know.  All questions and concerns were answered and addressed.  We have replaced her knees before and she says her knees are doing great.

## 2022-12-29 ENCOUNTER — Other Ambulatory Visit: Payer: Self-pay

## 2023-01-01 ENCOUNTER — Ambulatory Visit: Payer: 59 | Admitting: Orthopaedic Surgery

## 2023-01-04 ENCOUNTER — Other Ambulatory Visit (HOSPITAL_BASED_OUTPATIENT_CLINIC_OR_DEPARTMENT_OTHER): Payer: Self-pay

## 2023-01-04 ENCOUNTER — Other Ambulatory Visit (HOSPITAL_COMMUNITY): Payer: Self-pay

## 2023-01-04 ENCOUNTER — Ambulatory Visit (INDEPENDENT_AMBULATORY_CARE_PROVIDER_SITE_OTHER): Payer: 59 | Admitting: Medical

## 2023-01-04 VITALS — BP 112/70 | HR 64 | Ht 64.0 in | Wt 192.0 lb

## 2023-01-04 DIAGNOSIS — N3281 Overactive bladder: Secondary | ICD-10-CM | POA: Diagnosis not present

## 2023-01-04 DIAGNOSIS — E611 Iron deficiency: Secondary | ICD-10-CM

## 2023-01-04 DIAGNOSIS — Z90710 Acquired absence of both cervix and uterus: Secondary | ICD-10-CM

## 2023-01-04 DIAGNOSIS — E559 Vitamin D deficiency, unspecified: Secondary | ICD-10-CM

## 2023-01-04 DIAGNOSIS — E790 Hyperuricemia without signs of inflammatory arthritis and tophaceous disease: Secondary | ICD-10-CM | POA: Insufficient documentation

## 2023-01-04 DIAGNOSIS — Z0000 Encounter for general adult medical examination without abnormal findings: Secondary | ICD-10-CM

## 2023-01-04 DIAGNOSIS — I10 Essential (primary) hypertension: Secondary | ICD-10-CM

## 2023-01-04 DIAGNOSIS — E876 Hypokalemia: Secondary | ICD-10-CM

## 2023-01-04 DIAGNOSIS — M1712 Unilateral primary osteoarthritis, left knee: Secondary | ICD-10-CM

## 2023-01-04 DIAGNOSIS — R7301 Impaired fasting glucose: Secondary | ICD-10-CM

## 2023-01-04 DIAGNOSIS — E781 Pure hyperglyceridemia: Secondary | ICD-10-CM

## 2023-01-04 MED ORDER — WEGOVY 2.4 MG/0.75ML ~~LOC~~ SOAJ
2.4000 mg | SUBCUTANEOUS | 3 refills | Status: DC
  Filled 2023-01-04 – 2023-01-24 (×2): qty 3, 28d supply, fill #0
  Filled 2023-02-12 – 2023-02-14 (×2): qty 3, 28d supply, fill #1
  Filled 2023-03-15: qty 3, 28d supply, fill #2
  Filled 2023-04-15: qty 3, 28d supply, fill #3

## 2023-01-04 MED ORDER — WEGOVY 2.4 MG/0.75ML ~~LOC~~ SOAJ: 2.4000 mg | SUBCUTANEOUS | 0 refills | Status: DC

## 2023-01-04 MED ORDER — TIZANIDINE HCL 4 MG PO TABS: 4.0000 mg | ORAL_TABLET | Freq: Three times a day (TID) | ORAL | 0 refills | Status: DC | PRN

## 2023-01-04 MED ORDER — DICLOFENAC SODIUM 1 % EX GEL: 2.0000 g | Freq: Two times a day (BID) | CUTANEOUS | 2 refills | Status: DC | PRN

## 2023-01-04 MED ORDER — VITAMIN D 50 MCG (2000 UT) PO TABS: 2000.0000 [IU] | ORAL_TABLET | Freq: Every day | ORAL | 3 refills | Status: DC
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
## 2023-01-04 NOTE — Addendum Note (Signed)
Addended by: Jac Canavan on: 01/04/2023 08:59 AM   Modules accepted: Orders

## 2023-01-05 ENCOUNTER — Other Ambulatory Visit: Payer: Self-pay | Admitting: Medical

## 2023-01-05 LAB — COMPREHENSIVE METABOLIC PANEL
ALT: 15 [IU]/L (ref 0–32)
AST: 14 [IU]/L (ref 0–40)
Albumin: 4.4 g/dL (ref 3.8–4.9)
Alkaline Phosphatase: 65 [IU]/L (ref 44–121)
BUN/Creatinine Ratio: 14 (ref 9–23)
BUN: 11 mg/dL (ref 6–24)
Bilirubin Total: 0.2 mg/dL (ref 0.0–1.2)
CO2: 24 mmol/L (ref 20–29)
Calcium: 9.9 mg/dL (ref 8.7–10.2)
Chloride: 106 mmol/L (ref 96–106)
Creatinine, Ser: 0.79 mg/dL (ref 0.57–1.00)
Globulin, Total: 2.5 g/dL (ref 1.5–4.5)
Glucose: 81 mg/dL (ref 70–99)
Potassium: 4.1 mmol/L (ref 3.5–5.2)
Sodium: 144 mmol/L (ref 134–144)
Total Protein: 6.9 g/dL (ref 6.0–8.5)
eGFR: 89 mL/min/{1.73_m2} (ref >59)

## 2023-01-05 LAB — URIC ACID: Uric Acid: 2.4 mg/dL — ABNORMAL LOW (ref 3.0–7.2)

## 2023-01-05 LAB — LIPID PANEL
Chol/HDL Ratio: 2.5 {ratio} (ref 0.0–4.4)
Cholesterol, Total: 158 mg/dL (ref 100–199)
HDL: 63 mg/dL (ref >39)
LDL Chol Calc (NIH): 68 mg/dL (ref 0–99)
Triglycerides: 161 mg/dL — ABNORMAL HIGH (ref 0–149)
VLDL Cholesterol Cal: 27 mg/dL (ref 5–40)

## 2023-01-05 LAB — CBC
Hematocrit: 37.6 % (ref 34.0–46.6)
Hemoglobin: 11.3 g/dL (ref 11.1–15.9)
MCH: 23.3 pg — ABNORMAL LOW (ref 26.6–33.0)
MCHC: 30.1 g/dL — ABNORMAL LOW (ref 31.5–35.7)
MCV: 78 fL — ABNORMAL LOW (ref 79–97)
Platelets: 369 10*3/uL (ref 150–450)
RBC: 4.84 x10E6/uL (ref 3.77–5.28)
RDW: 15.8 % — ABNORMAL HIGH (ref 11.7–15.4)
WBC: 4.9 10*3/uL (ref 3.4–10.8)

## 2023-01-05 LAB — HEMOGLOBIN A1C
Est. average glucose Bld gHb Est-mCnc: 108 mg/dL
Hgb A1c MFr Bld: 5.4 % (ref 4.8–5.6)

## 2023-01-05 MED ORDER — POTASSIUM CHLORIDE ER 10 MEQ PO TBCR: 5.0000 meq | EXTENDED_RELEASE_TABLET | Freq: Every day | ORAL | 2 refills | Status: DC

## 2023-01-05 NOTE — Progress Notes (Signed)
Results sent through MyChart

## 2023-01-22 ENCOUNTER — Other Ambulatory Visit: Payer: Self-pay | Admitting: Podiatry

## 2023-01-24 ENCOUNTER — Other Ambulatory Visit: Payer: Self-pay

## 2023-01-25 ENCOUNTER — Other Ambulatory Visit (HOSPITAL_COMMUNITY): Payer: Self-pay

## 2023-02-01 ENCOUNTER — Other Ambulatory Visit: Payer: Self-pay | Admitting: Medical

## 2023-02-01 ENCOUNTER — Telehealth: Payer: Self-pay | Admitting: Medical

## 2023-02-01 NOTE — Telephone Encounter (Signed)
Pt is wanting to come get her pneumonia vaccine, is this okay?

## 2023-02-12 ENCOUNTER — Other Ambulatory Visit (HOSPITAL_COMMUNITY): Payer: Self-pay

## 2023-02-15 ENCOUNTER — Other Ambulatory Visit (HOSPITAL_COMMUNITY): Payer: Self-pay

## 2023-03-02 ENCOUNTER — Other Ambulatory Visit: Payer: Self-pay | Admitting: Medical

## 2023-03-19 ENCOUNTER — Other Ambulatory Visit: Payer: Self-pay | Admitting: Orthopaedic Surgery

## 2023-03-30 ENCOUNTER — Other Ambulatory Visit: Payer: Self-pay | Admitting: Medical

## 2023-03-30 NOTE — Telephone Encounter (Signed)
 Called patient and she did request refill on this medication.

## 2023-04-11 ENCOUNTER — Other Ambulatory Visit: Payer: Self-pay | Admitting: Medical

## 2023-04-28 ENCOUNTER — Other Ambulatory Visit: Payer: Self-pay | Admitting: Medical

## 2023-04-30 ENCOUNTER — Other Ambulatory Visit: Payer: Self-pay | Admitting: Medical

## 2023-05-24 ENCOUNTER — Other Ambulatory Visit: Payer: Self-pay | Admitting: Medical

## 2023-05-24 NOTE — Telephone Encounter (Signed)
 Pt is due for a visit. Please schedule.

## 2023-05-25 ENCOUNTER — Other Ambulatory Visit (HOSPITAL_COMMUNITY): Payer: Self-pay

## 2023-05-25 MED ORDER — WEGOVY 2.4 MG/0.75ML ~~LOC~~ SOAJ
2.4000 mg | SUBCUTANEOUS | 0 refills | Status: DC
  Filled 2023-05-25: qty 3, 28d supply, fill #0

## 2023-05-25 NOTE — Telephone Encounter (Signed)
 Appointment scheduled.

## 2023-06-01 ENCOUNTER — Other Ambulatory Visit: Payer: Self-pay | Admitting: Medical

## 2023-06-01 NOTE — Telephone Encounter (Signed)
Refill Approved

## 2023-06-14 ENCOUNTER — Encounter: Admitting: Medical

## 2023-06-15 ENCOUNTER — Other Ambulatory Visit (HOSPITAL_COMMUNITY): Payer: Self-pay

## 2023-06-15 ENCOUNTER — Encounter: Payer: Self-pay | Admitting: Medical

## 2023-06-15 ENCOUNTER — Ambulatory Visit: Admitting: Medical

## 2023-06-15 ENCOUNTER — Other Ambulatory Visit (HOSPITAL_BASED_OUTPATIENT_CLINIC_OR_DEPARTMENT_OTHER): Payer: Self-pay

## 2023-06-15 VITALS — BP 138/80 | HR 63 | Ht 64.0 in | Wt 194.6 lb

## 2023-06-15 DIAGNOSIS — E611 Iron deficiency: Secondary | ICD-10-CM

## 2023-06-15 DIAGNOSIS — E559 Vitamin D deficiency, unspecified: Secondary | ICD-10-CM

## 2023-06-15 DIAGNOSIS — I10 Essential (primary) hypertension: Secondary | ICD-10-CM | POA: Diagnosis not present

## 2023-06-15 DIAGNOSIS — E876 Hypokalemia: Secondary | ICD-10-CM

## 2023-06-15 DIAGNOSIS — R7301 Impaired fasting glucose: Secondary | ICD-10-CM | POA: Diagnosis not present

## 2023-06-15 DIAGNOSIS — E790 Hyperuricemia without signs of inflammatory arthritis and tophaceous disease: Secondary | ICD-10-CM

## 2023-06-15 DIAGNOSIS — M12811 Other specific arthropathies, not elsewhere classified, right shoulder: Secondary | ICD-10-CM

## 2023-06-15 DIAGNOSIS — M109 Gout, unspecified: Secondary | ICD-10-CM

## 2023-06-15 DIAGNOSIS — Z6833 Body mass index (BMI) 33.0-33.9, adult: Secondary | ICD-10-CM

## 2023-06-15 MED ORDER — TOLTERODINE TARTRATE ER 4 MG PO CP24: ORAL_CAPSULE | ORAL | 3 refills | Status: AC

## 2023-06-15 MED ORDER — POTASSIUM CHLORIDE ER 10 MEQ PO TBCR: 5.0000 meq | EXTENDED_RELEASE_TABLET | Freq: Every day | ORAL | 2 refills | Status: AC

## 2023-06-15 MED ORDER — WEGOVY 2.4 MG/0.75ML ~~LOC~~ SOAJ
2.4000 mg | SUBCUTANEOUS | 5 refills | Status: DC
  Filled 2023-06-15 – 2023-06-21 (×2): qty 3, 28d supply, fill #0
  Filled 2023-07-20: qty 3, 28d supply, fill #1
  Filled 2023-08-19: qty 3, 28d supply, fill #2
  Filled 2023-09-15: qty 3, 28d supply, fill #3
  Filled 2023-10-13: qty 3, 28d supply, fill #4
  Filled 2023-11-12 – 2023-11-22 (×2): qty 3, 28d supply, fill #5

## 2023-06-15 MED ORDER — AMLODIPINE-OLMESARTAN 5-20 MG PO TABS
1.0000 | ORAL_TABLET | Freq: Every day | ORAL | 1 refills | Status: AC
Start: 2023-06-15 — End: ?

## 2023-06-15 NOTE — Progress Notes (Signed)
 Subjective:  Kayla Larsen is a 53 y.o. female who presents for Chief Complaint  Patient presents with   Medication Check    Medication med/ everything has been going well with medications. Needs refills Wegovy  2.4     Here for medication management.  She still uses the Wegovy  2.4 mg weekly and doing fine on this although she does have some cravings at times.  She is exercising.  Walking and going to [employer].  Goes to [employer] every other day, walks on treadmill.  She has history of gout, elevated uric acid-uses allopurinol  every other day.  Podiatry had her change to this  Hypertension-compliant with her blood pressure medicine daily  Low potassium-compliant with Klor-Con   She needs several refills today  She continues on long-term iron therapy  No other aggravating or relieving factors.    No other c/o.  Past Medical History:  Diagnosis Date   Acanthosis nigricans    Anemia    Arthritis of knee    History of blood transfusion 2009   2 units after hysterectomy   Hypertension    Obesity    Wears glasses    Current Outpatient Medications on File Prior to Visit  Medication Sig Dispense Refill   allopurinol  (ZYLOPRIM ) 100 MG tablet TAKE 1 TABLET(100 MG) BY MOUTH DAILY 30 tablet 6   Ascorbic Acid (VITAMIN C PO) Take 2,000 mg by mouth daily.     Cholecalciferol  (VITAMIN D ) 50 MCG (2000 UT) tablet Take 1 tablet (2,000 Units total) by mouth daily. 90 tablet 3   CRANBERRY PO Take 1 capsule by mouth daily.     FEROSUL 325 (65 Fe) MG tablet TAKE 1 TABLET(325 MG) BY MOUTH DAILY 90 tablet 1   Multiple Vitamin (MULTIVITAMIN WITH MINERALS) TABS Take 1 tablet by mouth daily. One A Day for Women 50+     diclofenac  (VOLTAREN ) 75 MG EC tablet TAKE 1 TABLET(75 MG) BY MOUTH TWICE DAILY (Patient not taking: Reported on 06/15/2023) 60 tablet 3   diclofenac  Sodium (VOLTAREN ) 1 % GEL Apply 2 g topically 2 (two) times daily as needed. (Patient not taking: Reported on 06/15/2023) 100 g  2   tiZANidine  (ZANAFLEX ) 4 MG tablet TAKE 1 TABLET(4 MG) BY MOUTH EVERY 8 HOURS AS NEEDED FOR MUSCLE SPASMS (Patient not taking: Reported on 06/15/2023) 90 tablet 0   No current facility-administered medications on file prior to visit.    The following portions of the patient's history were reviewed and updated as appropriate: allergies, current medications, past family history, past medical history, past social history, past surgical history and problem list.  ROS Otherwise as in subjective above  Objective: BP 138/80   Pulse 63   Ht 5\' 4"  (1.626 m)   Wt 194 lb 9.6 oz (88.3 kg)   SpO2 97%   BMI 33.40 kg/m   General appearance: alert, no distress, well developed, well nourished Pulses: 2+ radial pulses, 2+ pedal pulses, normal cap refill Ext: no edema Psych: Pleasant, answer question appropriately  Assessment: Encounter Diagnoses  Name Primary?   Impaired fasting blood sugar Yes   Essential hypertension    Vitamin D  deficiency    Iron deficiency    Gout of foot, unspecified cause, unspecified chronicity, unspecified laterality    Elevated uric acid in blood    Hypokalemia    BMI 33.0-33.9,adult    Rotator cuff arthropathy of right shoulder      Plan: BMI 33, impaired glucose-continue Wegovy  2.4 mg weekly, continue regular exercise  and healthy eating habits  Hypertension-continue amlodipine  olmesartan  5/20 mg daily  Hypokalemia-continue Klor-Con  10 mill equivalents, 1/2 tablet daily  Elevated uric acid, history of gout-continue allopurinol  100 mg every other day  Continue vitamin D  supplement  Rotator cuff arthropathy-we will order some stretches and strengthening exercises for home therapy  Iron deficiency-she continues on iron therapy daily  Kayla Larsen was seen today for medication check.  Diagnoses and all orders for this visit:  Impaired fasting blood sugar  Essential hypertension  Vitamin D  deficiency  Iron deficiency  Gout of foot, unspecified cause,  unspecified chronicity, unspecified laterality  Elevated uric acid in blood  Hypokalemia  BMI 33.0-33.9,adult  Rotator cuff arthropathy of right shoulder  Other orders -     amLODipine -olmesartan  (AZOR ) 5-20 MG tablet; Take 1 tablet by mouth daily. -     Semaglutide -Weight Management (WEGOVY ) 2.4 MG/0.75ML SOAJ; Inject 2.4 mg into the skin once a week. -     tolterodine  (DETROL  LA) 4 MG 24 hr capsule; TAKE 1 CAPSULE(4 MG) BY MOUTH DAILY -     potassium chloride  (KLOR-CON ) 10 MEQ tablet; Take 0.5 tablets (5 mEq total) by mouth daily.    Follow up: yearly for well visit

## 2023-06-21 ENCOUNTER — Other Ambulatory Visit: Payer: Self-pay

## 2023-06-21 ENCOUNTER — Other Ambulatory Visit (HOSPITAL_COMMUNITY): Payer: Self-pay

## 2023-06-23 ENCOUNTER — Other Ambulatory Visit (HOSPITAL_COMMUNITY): Payer: Self-pay

## 2023-07-08 ENCOUNTER — Other Ambulatory Visit: Payer: Self-pay | Admitting: Medical

## 2023-09-03 ENCOUNTER — Other Ambulatory Visit: Payer: Self-pay | Admitting: Orthopaedic Surgery

## 2023-09-27 ENCOUNTER — Ambulatory Visit: Payer: Self-pay

## 2023-09-27 ENCOUNTER — Ambulatory Visit: Admitting: Medical

## 2023-09-27 VITALS — BP 120/80 | HR 76 | Temp 97.5°F | Wt 202.6 lb

## 2023-09-27 DIAGNOSIS — J020 Streptococcal pharyngitis: Secondary | ICD-10-CM | POA: Diagnosis not present

## 2023-09-27 DIAGNOSIS — J029 Acute pharyngitis, unspecified: Secondary | ICD-10-CM | POA: Diagnosis not present

## 2023-09-27 LAB — POCT RAPID STREP A (OFFICE): Rapid Strep A Screen: POSITIVE — AB

## 2023-09-27 MED ORDER — AMOXICILLIN 500 MG PO CAPS
500.0000 mg | ORAL_CAPSULE | Freq: Three times a day (TID) | ORAL | 0 refills | Status: AC
Start: 2023-09-27 — End: 2023-10-07

## 2023-09-27 NOTE — Progress Notes (Signed)
 Subjective: Chief Complaint  Patient presents with   Sore Throat    Sore throat since yesterday. Has white patch on right tonsil   History of Present Illness Kayla Larsen is a 53 year old female who presents with a sore throat and white patches on her tonsils.  She began experiencing a sore throat and noticed white patches on her tonsils yesterday. She reports no fever, headache, nausea, vomiting, diarrhea, or cough. She feels her lymph nodes are swollen.  She experiences hot flashes, describing them as alternating between feeling cold and hot. No ear pain, body aches, or chills.  She received a flu shot last Thursday a week ago.   She attended a birthday dinner over the past weekend and a coworker accidentally spitting on her lip white talking, which she suspects might be related to her symptoms.  She has no known allergies to antibiotics and has had strep throat before.    Physical Exam General appearence: alert, no distress, WD/WN,  HEENT: normocephalic, sclerae anicteric, TMs pearly, nares patent, no discharge or erythema, pharynx with erythema, right tonsillar white exudate, bilat 2+ tonsils Oral cavity: MMM, no lesions Neck: supple, shoddy tender anterior nodes, no thyromegaly, no masses Lungs: CTA bilaterally, no wheezes, rhonchi, or rales   Results LABS   Rapid strep test: Positive (09/27/2023)   Assessment and Plan Encounter Diagnoses  Name Primary?   Strep pharyngitis Yes   Sore throat     Streptococcal pharyngitis Acute sore throat with tonsillar exudates and lymphadenopathy. Positive rapid strep test confirms diagnosis. Discussed transmission and potential complications like tonsillar abscess. - Prescribed amoxicillin  500 mg TID for 10 days. - Advised warm fluids and salt water  gargles TID-QID. - Suggested OTC numbing spray for severe throat pain. - Recommended ibuprofen  or acetaminophen  for pain. - Advised avoiding close contact for 24 hours. - Provided  work note if needed. - Instructed to return if symptoms worsen or signs of abscess develop.  Kayla Larsen was seen today for sore throat.  Diagnoses and all orders for this visit:  Strep pharyngitis  Sore throat -     Rapid Strep A  Other orders -     amoxicillin  (AMOXIL ) 500 MG capsule; Take 1 capsule (500 mg total) by mouth 3 (three) times daily for 10 days.    F/u prn

## 2023-09-27 NOTE — Telephone Encounter (Signed)
 FYI Only or Action Required?: FYI only for provider.  Patient was last seen in primary care on 06/15/2023 by Kayla Larsen RAMAN, PA-C.  Called Nurse Triage reporting Sore Throat.  Symptoms began yesterday.  Interventions attempted: OTC medications: tylenol .  Symptoms are: mild sore throat, right tonsil with white patch/pus stable.  Triage Disposition: See PCP When Office is Open (Within 3 Days)  Patient/caregiver understands and will follow disposition?: Yes           Copied from CRM #[id number]. Topic: Clinical - Red Word Triage >> Sep 27, 2023  8:02 AM [place] MATSU wrote: Red Word that prompted transfer to Nurse Triage: pain in throat, sore throat, patient thinks she has strep. Reason for Disposition  [1] Sore throat is the only symptom AND [2] present > 48 hours  Answer Assessment - Initial Assessment Questions 1. ONSET: When did the throat start hurting? (Hours or days ago)      Teacher, English as a foreign language.  2. SEVERITY: How bad is the sore throat? (Scale 1-10; mild, moderate or severe)     2/10.  3. STREP EXPOSURE: Has there been any exposure to strep within the past week? If Yes, ask: What type of contact occurred?      No.  4.  VIRAL SYMPTOMS: Are there any symptoms of a cold, such as a runny nose, cough, hoarse voice or red eyes?      No.  5. FEVER: Do you have a fever? If Yes, ask: What is your temperature, how was it measured, and when did it start?     No.  6. PUS ON THE TONSILS: Is there pus on the tonsils in the back of your throat?     Yes, one big white patch behind right tonsil. She states she thought it was a tonsil stone, so she tried to remove it and the white patch would not come off.   7. OTHER SYMPTOMS: Do you have any other symptoms? (e.g., difficulty breathing, headache, rash)     Denies difficulty breathing or swallowing, headache, rash. C/o discomfort when swallowing. She states her tonsils and under jaw feel swollen.  8. PREGNANCY: Is there any  chance you are pregnant? When was your last menstrual period?     N/A.  Protocols used: Sore Throat-A-AH

## 2023-11-01 ENCOUNTER — Telehealth: Payer: Self-pay | Admitting: Orthopaedic Surgery

## 2023-11-01 NOTE — Telephone Encounter (Signed)
 Pt called stating that she is having an ingrown toe nail removed and is wondering if she needs to take antibiotics before the procedure. Call back number is [phone]

## 2023-11-02 ENCOUNTER — Ambulatory Visit: Admitting: Podiatry

## 2023-11-02 ENCOUNTER — Encounter: Payer: Self-pay | Admitting: Podiatry

## 2023-11-02 VITALS — Ht 64.0 in | Wt 202.0 lb

## 2023-11-02 DIAGNOSIS — L600 Ingrowing nail: Secondary | ICD-10-CM | POA: Diagnosis not present

## 2023-11-02 NOTE — Progress Notes (Signed)
 Subjective:   Patient ID: Romero LELON Lawrence, female   DOB: 53 y.o.   MRN: [id number]   HPI Patient presents painful ingrown toenail right big toes states that it is been there for a long time she has tried to trim and soak it without relief   ROS      Objective:  Physical Exam  Neurovascular status intact with chronic discomfort medial border right big toe painful when pressed inability to wear shoe gear without difficulty     Assessment:  Chronic ingrown toenail right hallux with pain and structural damage     Plan:  H&P reviewed recommended correction explained procedure risk patient wants surgery infiltrated the right big toe 60 mg like Marcaine  mixture sterile prep done using sterile instrumentation removed the medial border exposed matrix applied phenol 3 applications 36 followed by alcohol lavage sterile dressing gave instructions on soaks wear dressing 24 hours take it off earlier if throbbing were to occur and call with questions concerns which may arise

## 2023-11-02 NOTE — Patient Instructions (Signed)

## 2023-11-08 ENCOUNTER — Ambulatory Visit: Admitting: Medical

## 2023-11-08 ENCOUNTER — Ambulatory Visit: Payer: Self-pay | Admitting: Medical

## 2023-11-08 VITALS — BP 120/72 | HR 78 | Wt 196.6 lb

## 2023-11-08 DIAGNOSIS — R822 Biliuria: Secondary | ICD-10-CM | POA: Diagnosis not present

## 2023-11-08 DIAGNOSIS — R8290 Unspecified abnormal findings in urine: Secondary | ICD-10-CM | POA: Diagnosis not present

## 2023-11-08 DIAGNOSIS — R110 Nausea: Secondary | ICD-10-CM

## 2023-11-08 DIAGNOSIS — R3989 Other symptoms and signs involving the genitourinary system: Secondary | ICD-10-CM

## 2023-11-08 LAB — POCT URINALYSIS DIP (PROADVANTAGE DEVICE)
Blood, UA: NEGATIVE
Glucose, UA: NEGATIVE mg/dL
Ketones, POC UA: NEGATIVE mg/dL
Nitrite, UA: NEGATIVE
Specific Gravity, Urine: 1.02
Urobilinogen, Ur: NEGATIVE
pH, UA: 6 (ref 5.0–8.0)

## 2023-11-08 LAB — COMPREHENSIVE METABOLIC PANEL WITH GFR
ALT: 536 IU/L (ref 0–32)
AST: 133 IU/L — ABNORMAL HIGH (ref 0–40)
Albumin: 4.5 g/dL (ref 3.8–4.9)
Alkaline Phosphatase: 140 IU/L — ABNORMAL HIGH (ref 49–135)
BUN/Creatinine Ratio: 15 (ref 9–23)
BUN: 11 mg/dL (ref 6–24)
Bilirubin Total: 2 mg/dL — ABNORMAL HIGH (ref 0.0–1.2)
CO2: 21 mmol/L (ref 20–29)
Calcium: 9.5 mg/dL (ref 8.7–10.2)
Chloride: 102 mmol/L (ref 96–106)
Creatinine, Ser: 0.75 mg/dL (ref 0.57–1.00)
Globulin, Total: 2.9 g/dL (ref 1.5–4.5)
Glucose: 87 mg/dL (ref 70–99)
Potassium: 3.6 mmol/L (ref 3.5–5.2)
Sodium: 139 mmol/L (ref 134–144)
Total Protein: 7.4 g/dL (ref 6.0–8.5)
eGFR: 94 mL/min/1.73 (ref >59)

## 2023-11-08 MED ORDER — ONDANSETRON 4 MG PO TBDP: 4.0000 mg | ORAL_TABLET | Freq: Three times a day (TID) | ORAL | 0 refills | Status: DC | PRN

## 2023-11-08 NOTE — Progress Notes (Signed)
 Make sure urine sent for culture

## 2023-11-08 NOTE — Progress Notes (Signed)
 Subjective:  Kayla Larsen is a 53 y.o. female who presents for Chief Complaint  Patient presents with   Acute Visit    Nausea- had foot procedure done on Friday, bloated and hurts to bend over and will have pain upper stomach, last BM was Monday , urine orange with odor- noticed this yesterday     Here for nausea, abnormal color in the urine.  About 2 days ago her urine started looking orange and she has little bit of nausea little bit epigastric pain.  No other significant symptoms.  She did have an ingrown toenail last week and she had some leftover antibiotic at home that she took a little bit of amoxicillin  and doxycycline  for a few days.  She also took meloxicam  and Tylenol .  She denies urinary frequency, urgency, blood in urine, odor in urine.  No fever.  No body aches or chills.  No vaginal discharge.  No sexual activity in years, so no concern for STD.  No other aggravating or relieving factors.    No other c/o.  Past Medical History:  Diagnosis Date   Acanthosis nigricans    Anemia    Arthritis of knee    History of blood transfusion 2009   2 units after hysterectomy   Hypertension    Obesity    Wears glasses    Current Outpatient Medications on File Prior to Visit  Medication Sig Dispense Refill   allopurinol  (ZYLOPRIM ) 100 MG tablet TAKE 1 TABLET(100 MG) BY MOUTH DAILY 30 tablet 6   amLODipine -olmesartan  (AZOR ) 5-20 MG tablet Take 1 tablet by mouth daily. 90 tablet 1   Ascorbic Acid (VITAMIN C PO) Take 2,000 mg by mouth daily.     Cholecalciferol  (VITAMIN D ) 50 MCG (2000 UT) tablet Take 1 tablet (2,000 Units total) by mouth daily. 90 tablet 3   CRANBERRY PO Take 1 capsule by mouth daily.     FEROSUL 325 (65 Fe) MG tablet TAKE 1 TABLET(325 MG) BY MOUTH DAILY 90 tablet 1   meloxicam  (MOBIC ) 7.5 MG tablet TAKE 1 TABLET(7.5 MG) BY MOUTH TWICE DAILY BETWEEN MEALS AS NEEDED FOR PAIN 60 tablet 3   Multiple Vitamin (MULTIVITAMIN WITH MINERALS) TABS Take 1 tablet by mouth  daily. One A Day for Women 50+     potassium chloride  (KLOR-CON ) 10 MEQ tablet Take 0.5 tablets (5 mEq total) by mouth daily. 90 tablet 2   Semaglutide -Weight Management (WEGOVY ) 2.4 MG/0.75ML SOAJ Inject 2.4 mg into the skin once a week. 3 mL 5   tolterodine  (DETROL  LA) 4 MG 24 hr capsule TAKE 1 CAPSULE(4 MG) BY MOUTH DAILY 90 capsule 3   tiZANidine  (ZANAFLEX ) 4 MG tablet TAKE 1 TABLET(4 MG) BY MOUTH EVERY 8 HOURS AS NEEDED FOR MUSCLE SPASMS (Patient not taking: Reported on 11/08/2023) 90 tablet 0   No current facility-administered medications on file prior to visit.     The following portions of the patient's history were reviewed and updated as appropriate: allergies, current medications, past family history, past medical history, past social history, past surgical history and problem list.  ROS Otherwise as in subjective above  Objective: BP 120/72   Pulse 78   Wt 196 lb 9.6 oz (89.2 kg)   SpO2 98%   BMI 33.75 kg/m   General appearance: alert, no distress, well developed, well nourished There may be a subtle yellowish coloration of her sclera but she does wear tinted glasses as well Abdomen: +bs, soft, non tender, non distended, no masses, no  hepatomegaly, no splenomegaly Back: nontender Pulses: 2+ radial pulses, 2+ pedal pulses, normal cap refill Ext: no edema   Assessment: Encounter Diagnoses  Name Primary?   Abnormal urine odor Yes   Abnormal urine color    Bilirubinuria    Nausea      Plan: We discussed symptoms and concerns.  Labs as below.  Advise she hold off on any allergy 6 over-the-counter or her  Wegovy  today.  She is due to take Wegovy  today, but advised she  not to take this today.  She also had recent blood work done last month at her job.  I reviewed labs from her workplace screening from September 25, 2023: Cholesterol showed total cholesterol 189, LDL 102, HDL 66, triglycerides 118, glucose 82 GGT 17, normal Creatinine normal at 0.64, bilirubin,  AST and ALT all normal, alkaline phosphatase normal albumin normal, total protein normal  Advise good water  intake, Zofran  as below for nausea, call us  back if any new symptoms.  Otherwise await labs  Shante was seen today for acute visit.  Diagnoses and all orders for this visit:  Abnormal urine odor -     POCT Urinalysis DIP (Proadvantage Device) -     Urine Culture -     Comprehensive metabolic panel with GFR  Abnormal urine color -     Urine Culture -     Comprehensive metabolic panel with GFR  Bilirubinuria -     Urine Culture -     Comprehensive metabolic panel with GFR  Nausea -     Urine Culture -     Comprehensive metabolic panel with GFR    Follow up: pending labs

## 2023-11-09 ENCOUNTER — Ambulatory Visit: Payer: Self-pay | Admitting: Medical

## 2023-11-09 ENCOUNTER — Other Ambulatory Visit: Payer: Self-pay | Admitting: Medical

## 2023-11-09 ENCOUNTER — Ambulatory Visit (HOSPITAL_COMMUNITY)
Admission: RE | Admit: 2023-11-09 | Discharge: 2023-11-09 | Disposition: A | Discharge status: Home / Self Care | Source: Ambulatory Visit | Attending: Medical | Admitting: Medical

## 2023-11-09 ENCOUNTER — Telehealth: Payer: Self-pay

## 2023-11-09 DIAGNOSIS — R7989 Other specified abnormal findings of blood chemistry: Secondary | ICD-10-CM | POA: Insufficient documentation

## 2023-11-09 DIAGNOSIS — R17 Unspecified jaundice: Secondary | ICD-10-CM

## 2023-11-09 DIAGNOSIS — R110 Nausea: Secondary | ICD-10-CM

## 2023-11-09 NOTE — Telephone Encounter (Signed)
 There referral team was made aware of the patient needing a sooner appt than 11/7. [facility] is booked out and [facility] does not have anything anytime sooner either.   I am checking [facility] and [facility] for availability.   Samule Louder, CMA is aware and I am working on this.

## 2023-11-09 NOTE — Telephone Encounter (Signed)
 Copied from CRM #[id number]. Topic: Clinical - Medical Advice >> Nov 09, 2023  9:31 AM Everette C wrote: Reason for CRM: The patient was told that the soonest they could be seen for their recently ordered ultrasound was 11/16/23 the patient was instructed to find out from their PCP if that date was acceptable or if they would need to be referred to a different facility to be seen sooner. Please contact further when possible    The patient shares that they feel somewhat better than when they were last seen

## 2023-11-09 NOTE — Telephone Encounter (Signed)
 Pt scheduled for today at 2pm with [facility] but she is not wanting to take this appt due to having to fast all day long and having not eaten in almost 24hrs.   She is calling back to scheduling to see if they are able to get her in tomorrow or first part of next week.  She has to be fasting 6-8hrs straight with nothing to eat or drink, not even water .  She does not feel like she can do that today with having to with until 2pm.   Appt for today at [facility] has been canceled, patient is calling to find another appt that fits her schedule.

## 2023-11-09 NOTE — Telephone Encounter (Signed)
 [place] is going to the [place]  today at 2pm. She can wait

## 2023-11-09 NOTE — Progress Notes (Signed)
 I called and spoke to Kayla Larsen Ultrasound was abnormal but is not clear what the culprit is.  She was instructed to go to emergency department or the weekend if worse.  She did say her urine went back to normal color after good hydration the last few days.  She will hold off on iron, Tylenol , Wegovy , and alcohol.  I asked her to return Monday morning for recheck and labs including CMET, Lipase, CBC, and order for CT abdomen pelvis

## 2023-11-09 NOTE — Telephone Encounter (Signed)
 Noted.   I talked to her for a long time about appts and options.   Nothing further left to do from a referral standpoint.   Im glad she kept her appt for today at 2pm.

## 2023-11-09 NOTE — Progress Notes (Signed)
 Call patient.  As I was concerned about her liver, her liver tests are actually elevated.  I thought her eyes looked a little yellowish.  And she had bilirubin in the urine.  Remind me does she still have her gallbladder?  If she feels much worse today then go to the emergency department for evaluation.  If she feels about the same then lets do the following...  Please have lab add on acute hepatitis panel STAT  If she has not had her gallbladder out, lets set up for STAT ultrasound of liver.  If she has had her gallbladder out, then we need to do a stat CT abdomen pelvis  Also for the foreseeable future avoid alcohol.  Hold off on Wegovy , hold off on any over-the-counter pain medicine if possible, hold off on iron as well

## 2023-11-11 LAB — URINE CULTURE

## 2023-11-11 NOTE — Progress Notes (Signed)
 Results thru my chart  Please get her on the schedule Monday morning for follow-up on elevated liver test

## 2023-11-12 ENCOUNTER — Ambulatory Visit: Admitting: Medical

## 2023-11-12 ENCOUNTER — Encounter: Payer: Self-pay | Admitting: Radiology

## 2023-11-12 VITALS — BP 120/70 | HR 79 | Temp 98.2°F | Wt 199.2 lb

## 2023-11-12 DIAGNOSIS — E790 Hyperuricemia without signs of inflammatory arthritis and tophaceous disease: Secondary | ICD-10-CM | POA: Diagnosis not present

## 2023-11-12 DIAGNOSIS — Z79899 Other long term (current) drug therapy: Secondary | ICD-10-CM

## 2023-11-12 DIAGNOSIS — M1712 Unilateral primary osteoarthritis, left knee: Secondary | ICD-10-CM

## 2023-11-12 DIAGNOSIS — I10 Essential (primary) hypertension: Secondary | ICD-10-CM

## 2023-11-12 DIAGNOSIS — R110 Nausea: Secondary | ICD-10-CM

## 2023-11-12 DIAGNOSIS — R7989 Other specified abnormal findings of blood chemistry: Secondary | ICD-10-CM | POA: Insufficient documentation

## 2023-11-12 DIAGNOSIS — R7301 Impaired fasting glucose: Secondary | ICD-10-CM

## 2023-11-12 NOTE — Telephone Encounter (Signed)
 Copied from CRM [id number]. Topic: Clinical - Request for Lab/Test Order >> Nov 12, 2023  8:41 AM Kayla Larsen wrote: Reason for CRM: Patient calling to schedule per PA Tysinger request. Per his note on 11/09/23 wants patient retested for labs including CMET, Lipase, CBC, and order for CT abdomen pelvis but there are no lab or imaging orders in the patients chart. Please add orders for patient to schedule adnd advise if fasting is required.  Patient can be reached at [phone], ok to leave at [facility].

## 2023-11-12 NOTE — Progress Notes (Signed)
 Subjective:  Kayla Larsen is a 53 y.o. female who presents for Chief Complaint  Patient presents with   Follow-up    Follow-up on liver emyzmes      Here for recheck.  I saw her last week for nausea and orange urine.  After that visit she was found to have quite elevated liver tests.  We also had her go for ultrasound of the liver last week.  We had her hold off on allopurinol , Wegovy , iron which she is continuing to hold off on for now.  She feels fine today and the nausea and abnormal urine color resolved over the weekend.  In retrospect she notes that she had a recent procedure with podiatry on October 24.  She was taking some extra medications recently including tizanidine  muscle relaxer amoxicillin  and doxycycline .  But more importantly she notes because of her chronic arthritis and knee pains she has been taking 4 extra strength 650 mg Tylenol  daily for several years.  She denies urinary frequency, urgency, blood in urine, odor in urine.  No fever.  No body aches or chills.  No vaginal discharge.  No sexual activity in years, so no concern for STD.  She does not consume alcohol or do any recreational drugs.  No other aggravating or relieving factors.    No other c/o.  Past Medical History:  Diagnosis Date   Acanthosis nigricans    Anemia    Arthritis of knee    History of blood transfusion 2009   2 units after hysterectomy   Hypertension    Obesity    Wears glasses    Current Outpatient Medications on File Prior to Visit  Medication Sig Dispense Refill   allopurinol  (ZYLOPRIM ) 100 MG tablet TAKE 1 TABLET(100 MG) BY MOUTH DAILY 30 tablet 6   amLODipine -olmesartan  (AZOR ) 5-20 MG tablet Take 1 tablet by mouth daily. 90 tablet 1   Ascorbic Acid (VITAMIN C PO) Take 2,000 mg by mouth daily.     Cholecalciferol  (VITAMIN D ) 50 MCG (2000 UT) tablet Take 1 tablet (2,000 Units total) by mouth daily. 90 tablet 3   CRANBERRY PO Take 1 capsule by mouth daily.     FEROSUL 325 (65 Fe)  MG tablet TAKE 1 TABLET(325 MG) BY MOUTH DAILY (Patient not taking: Reported on 11/12/2023) 90 tablet 1   meloxicam  (MOBIC ) 7.5 MG tablet TAKE 1 TABLET(7.5 MG) BY MOUTH TWICE DAILY BETWEEN MEALS AS NEEDED FOR PAIN (Patient not taking: Reported on 11/12/2023) 60 tablet 3   Multiple Vitamin (MULTIVITAMIN WITH MINERALS) TABS Take 1 tablet by mouth daily. One A Day for Women 50+     ondansetron  (ZOFRAN -ODT) 4 MG disintegrating tablet Take 1 tablet (4 mg total) by mouth every 8 (eight) hours as needed for nausea or vomiting. 20 tablet 0   potassium chloride  (KLOR-CON ) 10 MEQ tablet Take 0.5 tablets (5 mEq total) by mouth daily. 90 tablet 2   Semaglutide -Weight Management (WEGOVY ) 2.4 MG/0.75ML SOAJ Inject 2.4 mg into the skin once a week. (Patient not taking: Reported on 11/12/2023) 3 mL 5   tiZANidine  (ZANAFLEX ) 4 MG tablet TAKE 1 TABLET(4 MG) BY MOUTH EVERY 8 HOURS AS NEEDED FOR MUSCLE SPASMS (Patient not taking: Reported on 11/08/2023) 90 tablet 0   tolterodine  (DETROL  LA) 4 MG 24 hr capsule TAKE 1 CAPSULE(4 MG) BY MOUTH DAILY 90 capsule 3   No current facility-administered medications on file prior to visit.     The following portions of the patient's history were reviewed  and updated as appropriate: allergies, current medications, past family history, past medical history, past social history, past surgical history and problem list.  ROS Otherwise as in subjective above  Objective: BP 120/70   Pulse 79   Temp 98.2 F (36.8 C)   Wt 199 lb 3.2 oz (90.4 kg)   SpO2 99%   BMI 34.19 kg/m   General appearance: alert, no distress, well developed, well nourished There is no obvious jaundice of the sclera today compared to last week. Abdomen: +bs, soft, non tender, non distended, no masses, no hepatomegaly, no splenomegaly Pulses: 2+ radial pulses, 2+ pedal pulses, normal cap refill Ext: no edema   Assessment: Encounter Diagnoses  Name Primary?   Nausea Yes   Elevated LFTs    Unilateral  primary osteoarthritis, left knee    Essential hypertension    Elevated uric acid in blood    Impaired fasting blood sugar    High risk medication use      Plan: We discussed her labs from last visit.  Repeat labs today and set up a CT abdomen pelvis  We discussed causes of elevated liver test.  After discussion today she has been taking 650 mg Tylenol  x 4 tablets daily for several years in addition to all of her other medications due to chronic knee pains.  She just had surgery recently and we will hold off for Tylenol  I will cut back on supplements discussed including folic acid and zinc.  She will continue to hold off on Wegovy  and allopurinol  for now her liver recuperate.  She does not drink alcohol.  She also had recent blood work done last month at her job.  I reviewed labs from her workplace screening from September 25, 2023: Cholesterol showed total cholesterol 189, LDL 102, HDL 66, triglycerides 118, glucose 82 GGT 17, normal Creatinine normal at 0.64, bilirubin, AST and ALT all normal, alkaline phosphatase normal albumin normal, total protein normal  Advise good water  intake, Zofran  as below for nausea    Kishana was seen today for follow-up.  Diagnoses and all orders for this visit:  Nausea  Elevated LFTs -     CT ABDOMEN PELVIS W CONTRAST; Future -     Lipase -     CBC with Differential/Platelet -     Gamma GT -     AFP tumor marker -     Comprehensive metabolic panel with GFR -     TSH + free T4  Unilateral primary osteoarthritis, left knee  Essential hypertension  Elevated uric acid in blood  Impaired fasting blood sugar  High risk medication use -     CT ABDOMEN PELVIS W CONTRAST; Future -     Lipase -     CBC with Differential/Platelet -     Gamma GT -     AFP tumor marker -     Comprehensive metabolic panel with GFR -     TSH + free T4    Follow up: pending labs

## 2023-11-13 ENCOUNTER — Ambulatory Visit: Payer: Self-pay | Admitting: Medical

## 2023-11-13 LAB — COMPREHENSIVE METABOLIC PANEL WITH GFR
ALT: 142 IU/L — ABNORMAL HIGH (ref 0–32)
AST: 21 IU/L (ref 0–40)
Albumin: 4.4 g/dL (ref 3.8–4.9)
Alkaline Phosphatase: 96 IU/L (ref 49–135)
BUN/Creatinine Ratio: 23 (ref 9–23)
BUN: 12 mg/dL (ref 6–24)
Bilirubin Total: 0.4 mg/dL (ref 0.0–1.2)
CO2: 22 mmol/L (ref 20–29)
Calcium: 9.6 mg/dL (ref 8.7–10.2)
Chloride: 105 mmol/L (ref 96–106)
Creatinine, Ser: 0.52 mg/dL — ABNORMAL LOW (ref 0.57–1.00)
Globulin, Total: 2.6 g/dL (ref 1.5–4.5)
Glucose: 93 mg/dL (ref 70–99)
Potassium: 3.3 mmol/L — ABNORMAL LOW (ref 3.5–5.2)
Sodium: 141 mmol/L (ref 134–144)
Total Protein: 7 g/dL (ref 6.0–8.5)
eGFR: 110 mL/min/1.73 (ref >59)

## 2023-11-13 LAB — TSH+FREE T4
Free T4: 1.27 ng/dL (ref 0.82–1.77)
TSH: 0.919 u[IU]/mL (ref 0.450–4.500)

## 2023-11-13 LAB — LIPASE: Lipase: 155 U/L — ABNORMAL HIGH (ref 14–72)

## 2023-11-13 LAB — CBC WITH DIFFERENTIAL/PLATELET
Basophils Absolute: 0 x10E3/uL (ref 0.0–0.2)
Basos: 0 %
EOS (ABSOLUTE): 0.1 x10E3/uL (ref 0.0–0.4)
Eos: 1 %
Hematocrit: 37.4 % (ref 34.0–46.6)
Hemoglobin: 11.1 g/dL (ref 11.1–15.9)
Immature Grans (Abs): 0 x10E3/uL (ref 0.0–0.1)
Immature Granulocytes: 0 %
Lymphocytes Absolute: 3.1 x10E3/uL (ref 0.7–3.1)
Lymphs: 42 %
MCH: 23.1 pg — ABNORMAL LOW (ref 26.6–33.0)
MCHC: 29.7 g/dL — ABNORMAL LOW (ref 31.5–35.7)
MCV: 78 fL — ABNORMAL LOW (ref 79–97)
Monocytes Absolute: 0.5 x10E3/uL (ref 0.1–0.9)
Monocytes: 7 %
Neutrophils Absolute: 3.5 x10E3/uL (ref 1.4–7.0)
Neutrophils: 50 %
Platelets: 385 x10E3/uL (ref 150–450)
RBC: 4.8 x10E6/uL (ref 3.77–5.28)
RDW: 14.9 % (ref 11.7–15.4)
WBC: 7.3 x10E3/uL (ref 3.4–10.8)

## 2023-11-13 LAB — AFP TUMOR MARKER: AFP, Serum, Tumor Marker: 6.4 ng/mL (ref 0.0–9.2)

## 2023-11-13 LAB — GAMMA GT: GGT: 229 IU/L — ABNORMAL HIGH (ref 0–60)

## 2023-11-13 NOTE — Progress Notes (Signed)
 Results to MyChart

## 2023-11-14 LAB — HCV INTERPRETATION

## 2023-11-14 LAB — ACUTE VIRAL HEPATITIS (HAV, HBV, HCV)
HCV Ab: NONREACTIVE
Hep A IgM: NEGATIVE
Hep B C IgM: NEGATIVE
Hepatitis B Surface Ag: NEGATIVE

## 2023-11-14 LAB — SPECIMEN STATUS REPORT

## 2023-11-16 ENCOUNTER — Other Ambulatory Visit

## 2023-11-20 ENCOUNTER — Inpatient Hospital Stay: Admission: RE | Admit: 2023-11-20 | Source: Ambulatory Visit

## 2023-11-21 ENCOUNTER — Other Ambulatory Visit (HOSPITAL_COMMUNITY): Payer: Self-pay

## 2023-11-21 ENCOUNTER — Other Ambulatory Visit: Payer: Self-pay

## 2023-11-22 ENCOUNTER — Other Ambulatory Visit (HOSPITAL_COMMUNITY): Payer: Self-pay

## 2023-11-24 ENCOUNTER — Other Ambulatory Visit: Payer: Self-pay | Admitting: Medical

## 2023-11-26 ENCOUNTER — Ambulatory Visit
Admission: RE | Admit: 2023-11-26 | Discharge: 2023-11-26 | Disposition: A | Source: Ambulatory Visit | Attending: Medical | Admitting: Medical

## 2023-11-26 DIAGNOSIS — Z79899 Other long term (current) drug therapy: Secondary | ICD-10-CM

## 2023-11-26 DIAGNOSIS — R7989 Other specified abnormal findings of blood chemistry: Secondary | ICD-10-CM

## 2023-11-26 MED ORDER — IOPAMIDOL (ISOVUE-370) INJECTION 76%
80.0000 mL | Freq: Once | INTRAVENOUS | Status: AC | PRN
  Administered 2023-11-26: 80 mL via INTRAVENOUS

## 2023-11-29 ENCOUNTER — Other Ambulatory Visit: Payer: Self-pay | Admitting: Medical

## 2023-11-29 DIAGNOSIS — R7989 Other specified abnormal findings of blood chemistry: Secondary | ICD-10-CM

## 2023-11-29 MED ORDER — LINACLOTIDE 72 MCG PO CAPS: 72.0000 ug | ORAL_CAPSULE | Freq: Every day | ORAL | 2 refills | Status: DC

## 2023-11-29 NOTE — Progress Notes (Signed)
 Results through MyChart

## 2023-11-30 ENCOUNTER — Other Ambulatory Visit

## 2023-11-30 DIAGNOSIS — R7989 Other specified abnormal findings of blood chemistry: Secondary | ICD-10-CM

## 2023-12-01 LAB — HEPATIC FUNCTION PANEL
ALT: 17 IU/L (ref 0–32)
AST: 14 IU/L (ref 0–40)
Albumin: 4.6 g/dL (ref 3.8–4.9)
Alkaline Phosphatase: 66 IU/L (ref 49–135)
Bilirubin Total: 0.3 mg/dL (ref 0.0–1.2)
Bilirubin, Direct: 0.15 mg/dL (ref 0.00–0.40)
Total Protein: 7.3 g/dL (ref 6.0–8.5)

## 2023-12-03 ENCOUNTER — Other Ambulatory Visit (HOSPITAL_COMMUNITY): Payer: Self-pay

## 2023-12-03 ENCOUNTER — Other Ambulatory Visit: Payer: Self-pay

## 2023-12-03 ENCOUNTER — Other Ambulatory Visit: Payer: Self-pay | Admitting: Medical

## 2023-12-03 ENCOUNTER — Ambulatory Visit: Payer: Self-pay | Admitting: Medical

## 2023-12-03 MED ORDER — WEGOVY 1 MG/0.5ML ~~LOC~~ SOAJ: 1.0000 mg | SUBCUTANEOUS | 1 refills | Status: DC

## 2023-12-03 MED ORDER — WEGOVY 1 MG/0.5ML ~~LOC~~ SOAJ
1.0000 mg | SUBCUTANEOUS | 1 refills | Status: DC
  Filled 2023-12-03 – 2023-12-23 (×2): qty 2, 28d supply, fill #0

## 2023-12-03 NOTE — Progress Notes (Signed)
 Results through MyChart

## 2023-12-10 LAB — HM MAMMOGRAPHY

## 2023-12-23 ENCOUNTER — Other Ambulatory Visit (HOSPITAL_COMMUNITY): Payer: Self-pay

## 2023-12-25 ENCOUNTER — Ambulatory Visit: Payer: Self-pay | Admitting: Medical

## 2023-12-25 ENCOUNTER — Encounter: Payer: Self-pay | Admitting: Internal Medicine

## 2023-12-25 VITALS — BP 130/76 | HR 60 | Ht 64.0 in | Wt 199.8 lb

## 2023-12-25 DIAGNOSIS — Z136 Encounter for screening for cardiovascular disorders: Secondary | ICD-10-CM

## 2023-12-25 DIAGNOSIS — E611 Iron deficiency: Secondary | ICD-10-CM

## 2023-12-25 DIAGNOSIS — Z90710 Acquired absence of both cervix and uterus: Secondary | ICD-10-CM

## 2023-12-25 DIAGNOSIS — I10 Essential (primary) hypertension: Secondary | ICD-10-CM

## 2023-12-25 DIAGNOSIS — R7301 Impaired fasting glucose: Secondary | ICD-10-CM

## 2023-12-25 DIAGNOSIS — Z0000 Encounter for general adult medical examination without abnormal findings: Secondary | ICD-10-CM

## 2023-12-25 DIAGNOSIS — N3281 Overactive bladder: Secondary | ICD-10-CM

## 2023-12-25 DIAGNOSIS — E66811 Obesity, class 1: Secondary | ICD-10-CM

## 2023-12-25 DIAGNOSIS — E790 Hyperuricemia without signs of inflammatory arthritis and tophaceous disease: Secondary | ICD-10-CM | POA: Insufficient documentation

## 2023-12-25 DIAGNOSIS — R7989 Other specified abnormal findings of blood chemistry: Secondary | ICD-10-CM

## 2023-12-25 DIAGNOSIS — Z23 Encounter for immunization: Secondary | ICD-10-CM

## 2023-12-25 NOTE — Addendum Note (Signed)
 Addended by: VICCI HUSBAND A on: 12/25/2023 02:26 PM   Modules accepted: Orders

## 2023-12-25 NOTE — Telephone Encounter (Signed)
abstracted 
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
## 2023-12-25 NOTE — Progress Notes (Signed)
 Requested pap smear

## 2023-12-25 NOTE — Progress Notes (Signed)
 Added in vaccines

## 2023-12-26 ENCOUNTER — Other Ambulatory Visit: Payer: Self-pay | Admitting: Medical

## 2023-12-26 ENCOUNTER — Ambulatory Visit: Payer: Self-pay | Admitting: Medical

## 2023-12-26 ENCOUNTER — Other Ambulatory Visit: Payer: Self-pay

## 2023-12-26 LAB — IRON,TIBC AND FERRITIN PANEL
Ferritin: 379 ng/mL — ABNORMAL HIGH (ref 15–150)
Iron Saturation: 24 % (ref 15–55)
Iron: 61 ug/dL (ref 27–159)
Total Iron Binding Capacity: 249 ug/dL — ABNORMAL LOW (ref 250–450)
UIBC: 188 ug/dL (ref 131–425)

## 2023-12-26 LAB — LIPID PANEL
Chol/HDL Ratio: 3.1 ratio (ref 0.0–4.4)
Cholesterol, Total: 171 mg/dL (ref 100–199)
HDL: 55 mg/dL (ref >39)
LDL Chol Calc (NIH): 92 mg/dL (ref 0–99)
Triglycerides: 139 mg/dL (ref 0–149)
VLDL Cholesterol Cal: 24 mg/dL (ref 5–40)

## 2023-12-26 LAB — HEMOGLOBIN A1C
Est. average glucose Bld gHb Est-mCnc: 111 mg/dL
Hgb A1c MFr Bld: 5.5 % (ref 4.8–5.6)

## 2023-12-26 LAB — URIC ACID: Uric Acid: 2.5 mg/dL — ABNORMAL LOW (ref 3.0–7.2)

## 2023-12-26 MED ORDER — AMLODIPINE-OLMESARTAN 5-20 MG PO TABS: 1.0000 | ORAL_TABLET | Freq: Every day | ORAL | 3 refills | Status: AC

## 2023-12-26 MED ORDER — WEGOVY 1 MG/0.5ML ~~LOC~~ SOAJ: 1.0000 mg | SUBCUTANEOUS | 0 refills | Status: DC

## 2023-12-26 MED ORDER — VITAMIN D 50 MCG (2000 UT) PO TABS: 2000.0000 [IU] | ORAL_TABLET | Freq: Every day | ORAL | 3 refills | Status: AC

## 2023-12-26 NOTE — Progress Notes (Signed)
 Results through MyChart

## 2023-12-27 ENCOUNTER — Other Ambulatory Visit (HOSPITAL_COMMUNITY): Payer: Self-pay

## 2024-01-07 ENCOUNTER — Encounter: Admitting: Medical

## 2024-01-11 ENCOUNTER — Encounter: Payer: 59 | Admitting: Medical

## 2024-01-12 ENCOUNTER — Other Ambulatory Visit: Payer: Self-pay | Admitting: Orthopaedic Surgery

## 2024-01-15 ENCOUNTER — Ambulatory Visit (HOSPITAL_BASED_OUTPATIENT_CLINIC_OR_DEPARTMENT_OTHER)
Admission: RE | Admit: 2024-01-15 | Discharge: 2024-01-15 | Disposition: A | Discharge status: Home / Self Care | Payer: Self-pay | Source: Ambulatory Visit | Attending: Medical | Admitting: Medical

## 2024-01-15 DIAGNOSIS — Z136 Encounter for screening for cardiovascular disorders: Secondary | ICD-10-CM | POA: Insufficient documentation

## 2024-01-15 DIAGNOSIS — Z0000 Encounter for general adult medical examination without abnormal findings: Secondary | ICD-10-CM | POA: Insufficient documentation

## 2024-01-17 NOTE — Progress Notes (Signed)
 Results through MyChart

## 2024-01-21 ENCOUNTER — Other Ambulatory Visit: Payer: Self-pay | Admitting: Medical

## 2024-01-21 ENCOUNTER — Other Ambulatory Visit (HOSPITAL_COMMUNITY): Payer: Self-pay

## 2024-01-21 ENCOUNTER — Telehealth: Payer: Self-pay | Admitting: Internal Medicine

## 2024-01-21 MED ORDER — WEGOVY 1.7 MG/0.75ML ~~LOC~~ SOAJ: 1.7000 mg | SUBCUTANEOUS | 1 refills | Status: DC

## 2024-01-21 MED ORDER — WEGOVY 1.7 MG/0.75ML ~~LOC~~ SOAJ
1.7000 mg | SUBCUTANEOUS | 1 refills | Status: AC
  Filled 2024-01-21: qty 3, 28d supply, fill #0
  Filled 2024-02-12: qty 3, 28d supply, fill #1

## 2024-01-21 NOTE — Telephone Encounter (Signed)
 Copied from CRM #[id number]. Topic: Clinical - Medication Question >> Jan 21, 2024 11:20 AM Tinnie C wrote: Reason for CRM: Pt wants to know if she can go back to lower dose of Wegovy  since her blood work came back good. Pt would like mychart message once answer is received. If anything is sent in, she states her primary pharmacy for wegovy  is the [facility].

## 2024-03-27 ENCOUNTER — Ambulatory Visit: Admitting: Medical

## 2024-12-31 ENCOUNTER — Encounter: Admitting: Medical
